# Patient Record
Sex: Male | Born: 1945
Health system: Southern US, Community
[De-identification: ages and names within clinical notes are randomized; demographics above are authoritative.]

## PROBLEM LIST (undated history)

## (undated) ENCOUNTER — Emergency Department (HOSPITAL_BASED_OUTPATIENT_CLINIC_OR_DEPARTMENT_OTHER): Admission: EM | Payer: PPO | Source: Home / Self Care

## (undated) DIAGNOSIS — H269 Unspecified cataract: Secondary | ICD-10-CM

## (undated) DIAGNOSIS — J342 Deviated nasal septum: Secondary | ICD-10-CM

## (undated) DIAGNOSIS — R200 Anesthesia of skin: Secondary | ICD-10-CM

## (undated) DIAGNOSIS — K219 Gastro-esophageal reflux disease without esophagitis: Secondary | ICD-10-CM

## (undated) DIAGNOSIS — J439 Emphysema, unspecified: Secondary | ICD-10-CM

## (undated) DIAGNOSIS — Z8701 Personal history of pneumonia (recurrent): Secondary | ICD-10-CM

## (undated) DIAGNOSIS — Z87891 Personal history of nicotine dependence: Secondary | ICD-10-CM

## (undated) DIAGNOSIS — I10 Essential (primary) hypertension: Secondary | ICD-10-CM

## (undated) DIAGNOSIS — E785 Hyperlipidemia, unspecified: Secondary | ICD-10-CM

## (undated) DIAGNOSIS — M189 Osteoarthritis of first carpometacarpal joint, unspecified: Secondary | ICD-10-CM

## (undated) DIAGNOSIS — Z8619 Personal history of other infectious and parasitic diseases: Secondary | ICD-10-CM

## (undated) DIAGNOSIS — E669 Obesity, unspecified: Secondary | ICD-10-CM

## (undated) DIAGNOSIS — J449 Chronic obstructive pulmonary disease, unspecified: Secondary | ICD-10-CM

## (undated) DIAGNOSIS — K4020 Bilateral inguinal hernia, without obstruction or gangrene, not specified as recurrent: Secondary | ICD-10-CM

## (undated) HISTORY — DX: Osteoarthritis of first carpometacarpal joint, unspecified: M18.9

## (undated) HISTORY — DX: Emphysema, unspecified: J43.9

## (undated) HISTORY — DX: Personal history of nicotine dependence: Z87.891

## (undated) HISTORY — DX: Personal history of other infectious and parasitic diseases: Z86.19

## (undated) HISTORY — DX: Personal history of pneumonia (recurrent): Z87.01

## (undated) HISTORY — DX: Bilateral inguinal hernia, without obstruction or gangrene, not specified as recurrent: K40.20

## (undated) HISTORY — DX: Obesity, unspecified: E66.9

## (undated) HISTORY — PX: CARDIAC CATHETERIZATION: SHX172

## (undated) HISTORY — DX: Unspecified cataract: H26.9

## (undated) HISTORY — DX: Deviated nasal septum: J34.2

---
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
## 2009-08-04 ENCOUNTER — Encounter: Admission: RE | Admit: 2009-08-04 | Discharge: 2009-08-04 | Payer: Self-pay | Admitting: Family Medicine

## 2009-08-05 ENCOUNTER — Ambulatory Visit: Payer: Self-pay | Admitting: Pulmonary Disease

## 2009-08-05 DIAGNOSIS — J439 Emphysema, unspecified: Secondary | ICD-10-CM

## 2009-08-06 DIAGNOSIS — J159 Unspecified bacterial pneumonia: Secondary | ICD-10-CM | POA: Insufficient documentation

## 2009-08-06 DIAGNOSIS — Z8701 Personal history of pneumonia (recurrent): Secondary | ICD-10-CM

## 2009-08-06 HISTORY — DX: Personal history of pneumonia (recurrent): Z87.01

## 2009-08-20 ENCOUNTER — Telehealth (INDEPENDENT_AMBULATORY_CARE_PROVIDER_SITE_OTHER): Payer: Self-pay | Admitting: *Deleted

## 2009-09-01 ENCOUNTER — Ambulatory Visit: Payer: Self-pay | Admitting: Pulmonary Disease

## 2009-09-01 ENCOUNTER — Ambulatory Visit (HOSPITAL_BASED_OUTPATIENT_CLINIC_OR_DEPARTMENT_OTHER): Admission: RE | Admit: 2009-09-01 | Discharge: 2009-09-01 | Payer: Self-pay | Admitting: Pulmonary Disease

## 2009-09-01 ENCOUNTER — Ambulatory Visit: Payer: Self-pay | Admitting: Diagnostic Radiology

## 2009-10-30 ENCOUNTER — Ambulatory Visit: Payer: Self-pay | Admitting: Pulmonary Disease

## 2009-10-30 LAB — CONVERTED CEMR LAB
Chloride: 104 meq/L (ref 96–112)
Creatinine, Ser: 0.9 mg/dL (ref 0.4–1.5)
GFR calc non Af Amer: 94.32 mL/min (ref >60)
Potassium: 4.9 meq/L (ref 3.5–5.1)

## 2009-11-03 ENCOUNTER — Ambulatory Visit: Payer: Self-pay | Admitting: Cardiovascular Disease

## 2009-11-10 ENCOUNTER — Ambulatory Visit: Payer: Self-pay | Admitting: Pulmonary Disease

## 2010-02-26 ENCOUNTER — Ambulatory Visit: Payer: Self-pay | Admitting: Pulmonary Disease

## 2010-03-11 ENCOUNTER — Ambulatory Visit: Payer: Self-pay | Admitting: Internal Medicine

## 2010-04-13 NOTE — Assessment & Plan Note (Signed)
Summary: rov/ mbw   Copy to:  [facility]-Dr. Copland Primary Provider/Referring Provider:  n/a  CC:  Pt here for follow up after CT.  History of Present Illness: 65/M, heavy ex smoker for FU of mild COPD & abnormal chest CT. He smoked > 40 pyrs before quitting in 2010. He developed a chest cold 4 weeks ago & presented to urgent care on 08/04/09 with chest pain x 7 days radiating from his right chest to his back, like a 'knife stuck there', pleuritic with cough & deep inspiration. Splinting the right chest seemed to help & vicodin relieved it. He denied fever, chills but had productive phlegm now after starting levaquin. CXR sugegsted fibrotic changes rt apex. CTc hest clarified this as biapical bullous emphysema with distortion of RUL with thick reticulo-nodular opacity extending to rt hilum & a small air-fluid level in one of the bulla. Mild paratracheal & pre-carinal LN was noted. Labs showed wc of 10.8 with 70%G & Hb 13.5. ekg showed nSR.  September 01, 2009 11:24 AM  c/o waking up with right side chest pain this AM and cough. Pt states finished abx Sunday and has been working half days to stay out of the sun while on same. c/o clear phlegm, no wheeze, fevers, hemoptysis. CXR appears improved.  November 10, 2009 4:29 PM  c/o dyspnea if climbing stairs with stone, ok onlevel ground CT chest - resolved AF level & mediastinal LNs Spirometry >> mild airway obstruction - FEV1 77%, severe in smaller airways  Preventive Screening-Counseling & Management  Alcohol-Tobacco     Smoking Status: quit     Packs/Day: 2.0     Year Started: 1969     Year Quit: 2010  Current Medications (verified): 1)  None  Allergies (verified): No Known Drug Allergies  Past History:  Family History: Last updated: 08/05/2009 Heart disease-mother  Social History: Last updated: 08/05/2009 Marital Status: married lives with wife Children: yes 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 (40 years 2ppd, quit 2010)  Review of Systems       The patient complains of dyspnea on exertion.  The patient denies anorexia, fever, weight loss, weight gain, vision loss, decreased hearing, hoarseness, chest pain, syncope, peripheral edema, prolonged cough, headaches, hemoptysis, abdominal pain, melena, hematochezia, severe indigestion/heartburn, hematuria, muscle weakness, suspicious skin lesions, transient blindness, difficulty walking, depression, unusual weight change, and abnormal bleeding.    Vital Signs:  Patient profile:   65 year old male Height:      69 inches Weight:      205 pounds BMI:     30.38 O2 Sat:      95 % on Room air Temp:     98 .3 degrees F oral Pulse rate:   63 / minute BP sitting:   142 / 88  (left arm) Cuff size:   regular  Vitals Entered By: Zackery Barefoot CMA (November 10, 2009 4:10 PM)  O2 Flow:  Room air CC: Pt here for follow up after CT Comments Medications reviewed with patient Verified contact number and pharmacy with patient Zackery Barefoot CMA  November 10, 2009 4:12 PM    Physical Exam  Additional Exam:  Gen. Pleasant, well-nourished, in no distress, normal affect ENT - no lesions, no post nasal drip Neck: No JVD, no thyromegaly, no carotid bruits Lungs: no use of accessory muscles, no dullness to percussion, clear without rales or rhonchi , increased VR rt apex Cardiovascular: Rhythm regular, heart sounds  normal,  no murmurs or gallops, no peripheral edema Musculoskeletal: No deformities, no cyanosis or clubbing      Impression & Recommendations:  Problem # 1:  BACTERIAL PNEUMONIA (ICD-482.9)  -resolved on CT chet NO fu imaging required  Orders: Est. Patient Level III (16109) Spirometry w/Graph (94010)  Problem # 2:  C O P D (ICD-496) -mild, Gold stg II albuterol MDI as needed   Patient Instructions: 1)  Copy sent to: Dr 2)  Please schedule a follow-up appointment in 6 months  with TP 3)  Take flu shot every year 4)  Take oneumonia shot @ age 73 5)  Albuterol inhaler as needed

## 2010-04-13 NOTE — Miscellaneous (Signed)
Summary: Orders Update  Clinical Lists Changes  Orders: Added new Test order of T-2 View CXR (71020TC) - Signed 

## 2010-04-13 NOTE — Assessment & Plan Note (Signed)
Summary: 3 weeks w/cxr/in [facility] office/apc   Visit Type:  Follow-up Copy to:  [facility]-Dr. Copland Primary Provider/Referring Provider:  n/a  CC:  Pt here for follow up with CXR. Pt c/o waking up with right side chest pain this AM and cough. Pt states finished abx "Sunday and has been working half days to stay out of the sun while on same.  History of Present Illness: 65/M, heavy ex smoker for evaluation of abnormal chest CT findings. He smoked > 40 pyrs before quitting in 2010. He developed a chest cold 4 weeks ago & presented to urgent care on 08/04/09 with chest pain x 7 days radiating from his right chest to his back, like a 'knife stuck there', pleuritic with cough & deep inspiration. Splinting the right chest seemed to help & vicodin relieved it. He denied fever, chills but had productive phlegm now after starting levaquin. CXR sugegsted fibrotic changes rt apex. CTc hest clarified this as biapical bullous emphysema with distortion of RUL with thick reticulo-nodular opacity extending to rt hilum & a small air-fluid level in one of the bulla. Mild paratracheal & pre-carinal LN was noted. Labs showed wc of 10.8 with 70%G & Hb 13.5. ekg showed nSR.  September 01, 2009 11:24 AM  c/o waking up with right side chest pain this AM and cough. Pt states finished abx Sunday and has been working half days to stay out of the sun while on same. c/o clear phlegm, no wheeze, fevers, hemoptysis. CXR appears improved.  Preventive Screening-Counseling & Management  Alcohol-Tobacco     Smoking Status: quit  Current Medications (verified): 1)  Vicodin 5-500 Mg Tabs (Hydrocodone-Acetaminophen) .... 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 (40 years  2ppd, quit 2010)  Review of Systems       The patient complains of dyspnea on exertion.  The patient denies anorexia, fever, weight loss, weight gain, vision loss, decreased hearing, hoarseness, chest pain, syncope, peripheral edema, prolonged cough, headaches, hemoptysis, abdominal pain, melena, hematochezia, severe indigestion/heartburn, hematuria, incontinence, suspicious skin lesions, difficulty walking, depression, unusual weight change, and abnormal bleeding.    Vital Signs:  Patient profile:   65 year old male Height:      69 inches Weight:      192.5 pounds BMI:     28.53 O2 Sat:      97 % on Room air Temp:     98" .4 degrees F oral Pulse rate:   63 / minute BP sitting:   122 / 82  (left arm) Cuff size:   regular  Vitals Entered By: Zackery Barefoot CMA (September 01, 2009 11:04 AM)  O2 Flow:  Room air CC: Pt here for follow up with CXR. Pt c/o waking up with right side chest pain this AM, cough. Pt states finished abx Sunday and has been working half days to stay out of the sun while on same Comments Medications reviewed with patient Verified contact number and pharmacy with patient Zackery Barefoot CMA  September 01, 2009 11:07 AM    Physical Exam  Additional Exam:  Gen. Pleasant, well-nourished, in no distress, normal affect ENT - no lesions, no post nasal drip Neck: No JVD, no thyromegaly, no carotid bruits Lungs: no use of accessory muscles, no dullness to percussion,  clear without rales or rhonchi , increased VR rt apex Cardiovascular: Rhythm regular, heart sounds  normal, no murmurs or gallops, no peripheral edema Musculoskeletal: No deformities, no cyanosis or clubbing      CXR  Procedure date:  09/01/2009  Findings:      IMPRESSION: Stable COPD/emphysema, scarring in the right upper lobe, and fibrosis in the right middle lobe.  No new abnormalities.  Impression & Recommendations:  Problem # 1:  BACTERIAL PNEUMONIA (ICD-482.9) No more ABx required - based on  symptoms & CXR FU CT in 2 months  Orders: Est. Patient Level III (78295) Radiology Referral (Radiology)  Problem # 2:  C O P D (ICD-496) spirometry on next visit once fully resolved.  Patient Instructions: 1)  Copy sent to: 2)  Please schedule a follow-up appointment in 2 months. 3)  A Chest CT with Contrast has been recommended.  Your imaging study may require preauthorization.

## 2010-04-13 NOTE — Progress Notes (Signed)
Summary: talk to nurse  Phone Note Call from Patient Call back at [phone]   Caller: Patient Call For: alva Summary of Call: have questions about his condition and whether he should get refill on med Initial call taken by: Rickard Patience,  August 20, 2009 9:31 AM  Follow-up for Phone Call        pt states per pt instruction sheet he was supposed to call and give an update in 2 weeks on how he was feeling. he states he still has an occ. cough and some soreness, but it is much improved. He ahs f/u set for june 21st. He wanted to know does he need to take more abx?  Please advise. Carron Curie CMA  August 20, 2009 10:38 AM  avelox x 7 more days , then CXR please Follow-up by: Comer Locket. Vassie Loll MD,  August 20, 2009 11:28 AM  Additional Follow-up for Phone Call Additional follow up Details #1::        pt had refill on avelox so he is going to pharmacy to pick up now-also has appt 6/21 with RA at the [facility] will have repeat cxr at [facility] Additional Follow-up by: Philipp Deputy CMA,  August 20, 2009 11:54 AM

## 2010-04-13 NOTE — Assessment & Plan Note (Signed)
Summary: ABNORMAL CHEST CT ///kp   Visit Type:  Initial Consult Copy to:  [facility]-Dr. Copland Primary Provider/Referring Provider:  n/a  CC:  Pt c/o cough and chest pain.  History of Present Illness: 64/M, heavy ex smoker for evaluation of abnormal chest CT findings. He smoked > 40 pyrs before quitting in 2010. He developed a chest cold 4 weeks ago & presented to urgent care on 08/04/09 with chest pain x 7 days radiating from his right chest to his back, like a 'knife stuck there', pleuritic with cough & deep inspiration. Splinting the right chest seemed to help & vicodin relieved it. He denied fever, chills but had productive phlegm now after starting levaquin. CXR sugegsted fibrotic changes rt apex. CTc hest clarified this as biapical bullous emphysema with distortion of RUL with thick reticulo-nodular opacity extending to rt hilum & a small air-fluid level in one of the bulla. Mild paratracheal & pre-carinal LN was noted. Labs showed wc of 10.8 with 70%G & Hb 13.5. ekg showed nSR.   Preventive Screening-Counseling & Management  Alcohol-Tobacco     Smoking Status: quit     Packs/Day: 2.0     Year Started: 1969     Year Quit: 2010  Current Medications (verified): 1)  Levaquin .... Take 1 Tablet By Mouth Once A Day 2)  Vicodin 5-500 Mg Tabs (Hydrocodone-Acetaminophen) .... Take 1 Tablet By Mouth Two Times A Day As Needed  Allergies (verified): No Known Drug Allergies  Past History:  Past Medical History: none  Past Surgical History: none  Family History: Heart disease-mother  Social History: Marital Status: married lives with wife Children: yes Occupation: Scientist, research (physical sciences) Patient states former smoker. (40 years 2ppd, quit 2010) Smoking Status:  quit Packs/Day:  2.0  Review of Systems       The patient complains of shortness of breath with activity, chest pain, and joint stiffness or pain.  The patient denies shortness of breath  at rest, productive cough, non-productive cough, coughing up blood, irregular heartbeats, acid heartburn, indigestion, loss of appetite, weight change, abdominal pain, difficulty swallowing, sore throat, tooth/dental problems, headaches, nasal congestion/difficulty breathing through nose, sneezing, itching, ear ache, anxiety, depression, hand/feet swelling, rash, change in color of mucus, and fever.    Vital Signs:  Patient profile:   65 year old male Height:      69 inches Weight:      196 pounds BMI:     29.05 O2 Sat:      94 % on Room air Temp:     99.6 degrees F oral Pulse rate:   77 / minute BP sitting:   110 / 70  (left arm) Cuff size:   regular  Vitals Entered By: Zackery Barefoot CMA (Aug 05, 2009 3:27 PM)  O2 Flow:  Room air CC: Pt c/o cough, chest pain Comments Medications reviewed with patient Verified contact number and pharmacy with patient Zackery Barefoot CMA  Aug 05, 2009 3:28 PM    Physical Exam  Additional Exam:  Gen. Pleasant, well-nourished, in no distress, normal affect ENT - no lesions, no post nasal drip Neck: No JVD, no thyromegaly, no carotid bruits Lungs: no use of accessory muscles, no dullness to percussion, clear without rales or rhonchi , increased VR rt apex Cardiovascular: Rhythm regular, heart sounds  normal, no murmurs or gallops, no peripheral edema Abdomen: soft and non-tender, no hepatosplenomegaly, BS normal. Musculoskeletal: No deformities, no cyanosis or clubbing Neuro:  alert, non focal  Impression & Recommendations:  Problem # 1:  BACTERIAL PNEUMONIA (ICD-482.9)  treat as community acqd pna RUL - may need longer duration of ABx to clear RUL 14-21 days depending on clinical improvement. Postural drainage of RUL. will need FU CXR in 2-3 wks & FU CT chest for clearance in 3 months His updated medication list for this problem includes:    Avelox 400 Mg Tabs (Moxifloxacin hcl) ..... Once daily  Orders: Consultation Level IV  (16109) Prescription Created Electronically [phone])  Problem # 2:  C O P D (ICD-496) lung function assessment when resolved pna no need for O2 at this time  Medications Added to Medication List This Visit: 1)  Levaquin  .... Take 1 tablet by mouth once a day 2)  Avelox 400 Mg Tabs (Moxifloxacin hcl) .... Once daily 3)  Vicodin 5-500 Mg Tabs (Hydrocodone-acetaminophen) .... Take 1 tablet by mouth two times a day as needed  Patient Instructions: 1)  Please schedule a follow-up appointment in 3 weeks with CXR 2)  Copy sent to:Dr copland 3)  Levaquin x 2 weeks 4)  Call me in 2 wks to report - fever, cough, pain Prescriptions: AVELOX 400 MG TABS (MOXIFLOXACIN HCL) once daily  #10 x 1   Entered and Authorized by:   Comer Locket Vassie Loll MD   Signed by:   Comer Locket Vassie Loll MD on 08/05/2009   Method used:   Electronically to        [facility] (retail)       [address]       [place], [place]  [id number]       Ph: [phone]       Fax: [phone]   RxID:   [id number]

## 2010-04-15 NOTE — Assessment & Plan Note (Signed)
Summary: per pt call/cb   Copy to:  [facility]-Dr. Copland Primary Arietta Eisenstein/Referring Alysiah Suppa:  n/a  CC:  Followup per Dr Vassie Loll.  Pt states that his breathing is some better since last seen.  Marland Kitchen  History of Present Illness: 64/M, heavy ex smoker for FU of mild COPD & abnormal chest CT. He smoked > 40 pyrs before quitting in 2010. He developed a chest cold 4 weeks ago & presented to urgent care on 08/04/09 with chest pain x 7 days radiating from his right chest to his back, like a 'knife stuck there', pleuritic with cough & deep inspiration. Splinting the right chest seemed to help & vicodin relieved it. He denied fever, chills but had productive phlegm now after starting levaquin. CXR sugegsted fibrotic changes rt apex. CTc hest clarified this as biapical bullous emphysema with distortion of RUL with thick reticulo-nodular opacity extending to rt hilum & a small air-fluid level in one of the bulla. Mild paratracheal & pre-carinal LN was noted. Labs showed wc of 10.8 with 70%G & Hb 13.5. ekg showed nSR.  September 01, 2009 11:24 AM  c/o waking up with right side chest pain this AM and cough. Pt states finished abx Sunday and has been working half days to stay out of the sun while on same. c/o clear phlegm, no wheeze, fevers, hemoptysis. CXR appears improved.  November 10, 2009 4:29 PM  c/o dyspnea if climbing stairs with stone, ok onlevel ground CT chest - resolved AF level & mediastinal LNs Spirometry >> mild airway obstruction - FEV1 77%, severe in smaller airways  02/19/10--Presents for 4 months follow up  Pt states that his breathing is some better since last seen.   Doing okay since last office visit. Rare use of proair. No flare in symtpoms of dyspnea or cough. We disucssed that he needs to establish with PCP -he does not have a PCP. Denies chest pain, dyspnea, orthopnea, hemoptysis, fever, n/v/d, edema, headache.  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 (40 years 2ppd, quit 2010)  Review of Systems      See HPI  Vital Signs:  Patient profile:   64 year old male Weight:      213 pounds O2 Sat:      95 % on Room air Temp:     99 .2 degrees F oral Pulse rate:   77 / minute BP sitting:   162 / 98  (left arm)  Vitals Entered By: Vernie Murders (March 01, 2010 4:09 PM)  O2 Flow:  Room air CC: Followup per Dr Vassie Loll.  Pt states that his breathing is some better since last seen.   Is Patient Diabetic? No   Physical Exam  Additional Exam:  Gen. Pleasant, well-nourished, in no distress, normal affect ENT - no lesions, no post nasal drip Neck: No JVD, no thyromegaly, no carotid bruits Lungs: no use of accessory muscles, no dullness to percussion, clear without rales or rhonchi  Cardiovascular: Rhythm regular, heart sounds  normal, no murmurs or gallops, no peripheral edema Musculoskeletal: No deformities, no cyanosis or clubbing      Impression & Recommendations:  Problem # 1:  C O P D (ICD-496) -mild, Gold stg II--asymptomatic presently.  albuterol MDI as needed  will set up with PCP   Medications Added to Medication List This Visit: 1)  Proair Hfa 108 (90 Base) Mcg/act Aers (Albuterol sulfate) .... 2 puffs every 4-6 hr as needed wheezing  Complete Medication List: 1)  Proair Hfa 108 (90 Base) Mcg/act Aers (Albuterol sulfate) .... 2 puffs every 4-6 hr as needed wheezing  Other Orders: Internal Medicine Referral (Internal) Est. Patient Level II (81191)  Patient Instructions: 1)  Please schedule a follow-up appointment in 9  months with Dr. Vassie Loll  2)  Take flu shot every year 3)  Take  pneumonia shot @ age 48 4)  Albuterol inhaler as needed  5)  Need to establish with family doctor.

## 2011-03-18 ENCOUNTER — Encounter: Payer: Self-pay | Admitting: Emergency Medicine

## 2011-03-18 ENCOUNTER — Emergency Department (HOSPITAL_COMMUNITY)
Admission: EM | Admit: 2011-03-18 | Discharge: 2011-03-19 | Disposition: A | Discharge status: Home / Self Care | Payer: BC Managed Care – PPO | Attending: Emergency Medicine | Admitting: Emergency Medicine

## 2011-03-18 DIAGNOSIS — J90 Pleural effusion, not elsewhere classified: Secondary | ICD-10-CM | POA: Insufficient documentation

## 2011-03-18 DIAGNOSIS — R059 Cough, unspecified: Secondary | ICD-10-CM | POA: Insufficient documentation

## 2011-03-18 DIAGNOSIS — R079 Chest pain, unspecified: Secondary | ICD-10-CM | POA: Insufficient documentation

## 2011-03-18 DIAGNOSIS — R05 Cough: Secondary | ICD-10-CM | POA: Insufficient documentation

## 2011-03-18 DIAGNOSIS — J159 Unspecified bacterial pneumonia: Secondary | ICD-10-CM | POA: Insufficient documentation

## 2011-03-18 DIAGNOSIS — J449 Chronic obstructive pulmonary disease, unspecified: Secondary | ICD-10-CM | POA: Insufficient documentation

## 2011-03-18 DIAGNOSIS — J4489 Other specified chronic obstructive pulmonary disease: Secondary | ICD-10-CM | POA: Insufficient documentation

## 2011-03-18 HISTORY — DX: Chronic obstructive pulmonary disease, unspecified: J44.9

## 2011-03-18 NOTE — ED Notes (Signed)
EKG completed and shown to Dr. Bebe Shaggy. No OLD ekg.

## 2011-03-18 NOTE — ED Notes (Signed)
PT. REPORTS PROGRESSING LEFT LATERAL RIBCAGE PAIN WITH SOB AND PRODUCTIVE COUGH ONSET YESTERDAY , DENIES INJURY OR FALL , NO FEVER OR CHILLS.

## 2011-03-19 ENCOUNTER — Emergency Department (HOSPITAL_COMMUNITY): Payer: BC Managed Care – PPO

## 2011-03-19 ENCOUNTER — Encounter (HOSPITAL_COMMUNITY): Payer: Self-pay | Admitting: Radiology

## 2011-03-19 LAB — BASIC METABOLIC PANEL
CO2: 24 mEq/L (ref 19–32)
Calcium: 9 mg/dL (ref 8.4–10.5)
Creatinine, Ser: 0.93 mg/dL (ref 0.50–1.35)
GFR calc Af Amer: >90 mL/min (ref >90)
Glucose, Bld: 116 mg/dL — ABNORMAL HIGH (ref 70–99)
Sodium: 140 mEq/L (ref 135–145)

## 2011-03-19 LAB — CBC
MCHC: 35.1 g/dL (ref 30.0–36.0)
Platelets: 217 10*3/uL (ref 150–400)
RBC: 4.64 MIL/uL (ref 4.22–5.81)

## 2011-03-19 LAB — POCT I-STAT TROPONIN I: Troponin i, poc: 0 ng/mL (ref 0.00–0.08)

## 2011-03-19 MED ORDER — KETOROLAC TROMETHAMINE 30 MG/ML IJ SOLN
30.0000 mg | Freq: Once | INTRAMUSCULAR | Status: AC
  Administered 2011-03-19: 30 mg via INTRAVENOUS
  Filled 2011-03-19: qty 1

## 2011-03-19 MED ORDER — HYDROCODONE-ACETAMINOPHEN 5-500 MG PO TABS: 1.0000 | ORAL_TABLET | Freq: Four times a day (QID) | ORAL | Status: DC | PRN

## 2011-03-19 MED ORDER — IOHEXOL 300 MG/ML  SOLN
100.0000 mL | Freq: Once | INTRAMUSCULAR | Status: AC | PRN
  Administered 2011-03-19: 100 mL via INTRAVENOUS

## 2011-03-19 MED ORDER — MOXIFLOXACIN HCL 400 MG PO TABS
400.0000 mg | ORAL_TABLET | Freq: Once | ORAL | Status: AC
  Administered 2011-03-19: 400 mg via ORAL
  Filled 2011-03-19: qty 1

## 2011-03-19 MED ORDER — LEVOFLOXACIN 500 MG PO TABS: 500.0000 mg | ORAL_TABLET | Freq: Every day | ORAL | Status: DC

## 2011-03-19 MED ORDER — FENTANYL CITRATE 0.05 MG/ML IJ SOLN
50.0000 ug | Freq: Once | INTRAMUSCULAR | Status: AC
  Administered 2011-03-19: 04:00:00 via INTRAVENOUS
  Filled 2011-03-19: qty 2

## 2011-03-19 NOTE — ED Provider Notes (Signed)
History     CSN: [id number]  Arrival date & time 03/18/11  2345   First MD Initiated Contact with Patient 03/19/11 [phone]      Chief Complaint  Patient presents with  . Chest Pain    (Consider location/radiation/quality/duration/timing/severity/associated sxs/prior treatment) Patient is a 66 y.o. male presenting with chest pain. The history is provided by the patient. No language interpreter was used.  Chest Pain The chest pain began 2 days ago. Chest pain occurs constantly. The chest pain is unchanged. The pain is associated with breathing and coughing. At its most intense, the pain is at 9/10. The pain is currently at 3/10. The quality of the pain is described as dull, pleuritic and sharp. The pain does not radiate (left mid axillary line). Chest pain is worsened by deep breathing. Primary symptoms include cough. Pertinent negatives for primary symptoms include no fever, no fatigue, no syncope, no shortness of breath, no wheezing, no palpitations, no abdominal pain, no nausea, no vomiting, no dizziness and no altered mental status. Treatments tried: vicodin. Risk factors include being elderly and male gender.  Pertinent negatives for past medical history include no aneurysm.  Pertinent negatives for family medical history include: family history of aortic dissection.  Procedure history is negative for cardiac catheterization.     Past Medical History  Diagnosis Date  . COPD (chronic obstructive pulmonary disease)     History reviewed. No pertinent past surgical history.  No family history on file.  History  Substance Use Topics  . Smoking status: Never Smoker   . Smokeless tobacco: Not on file  . Alcohol Use: No      Review of Systems  Constitutional: Negative for fever and fatigue.  HENT: Negative for facial swelling, neck pain and neck stiffness.   Eyes: Negative for discharge.  Respiratory: Positive for cough. Negative for shortness of breath and wheezing.     Cardiovascular: Positive for chest pain. Negative for palpitations and syncope.  Gastrointestinal: Negative for nausea, vomiting, abdominal pain and abdominal distention.  Genitourinary: Negative for difficulty urinating.  Musculoskeletal: Negative for arthralgias.  Neurological: Negative for dizziness.  Hematological: Negative.   Psychiatric/Behavioral: Negative.  Negative for altered mental status.    Allergies  Review of patient's allergies indicates no known allergies.  Home Medications   Current Outpatient Rx  Name Route Sig Dispense Refill  . HYDROCODONE-ACETAMINOPHEN 5-500 MG PO TABS Oral Take 1 tablet by mouth every 6 (six) hours as needed. For pain     . HYDROCODONE-ACETAMINOPHEN 5-500 MG PO TABS Oral Take 1 tablet by mouth every 6 (six) hours as needed for pain. 10 tablet 0  . LEVOFLOXACIN 500 MG PO TABS Oral Take 1 tablet (500 mg total) by mouth daily. 7 tablet 0    BP 152/82  Pulse 80  Temp 97.6 F (36.4 C)  Resp 22  SpO2 94%  Physical Exam  Constitutional: He is oriented to person, place, and time. He appears well-developed and well-nourished. No distress.  HENT:  Head: Normocephalic and atraumatic.  Mouth/Throat: Oropharynx is clear and moist. No oropharyngeal exudate.  Eyes: Conjunctivae and EOM are normal. Pupils are equal, round, and reactive to light.  Neck: Normal range of motion. Neck supple.  Cardiovascular: Normal rate and regular rhythm.   Pulmonary/Chest: Effort normal and breath sounds normal. No stridor. He has no wheezes. He exhibits tenderness.  Abdominal: Soft. Bowel sounds are normal. There is no tenderness. There is no guarding.  Musculoskeletal: Normal range of motion. He exhibits no  edema.  Lymphadenopathy:    He has no cervical adenopathy.  Neurological: He is alert and oriented to person, place, and time.  Skin: Skin is warm and dry. He is not diaphoretic.  Psychiatric: Thought content normal.    ED Course  Procedures (including  critical care time)  Labs Reviewed  BASIC METABOLIC PANEL - Abnormal; Notable for the following:    Glucose, Bld 116 (*)    GFR calc non Af Amer 88 (*)    All other components within normal limits  CBC  POCT I-STAT TROPONIN I  I-STAT TROPONIN I   Dg Chest 2 View  03/19/2011  *RADIOLOGY REPORT*  Clinical Data: Left lateral rib pain.  CHEST - 2 VIEW  Comparison: 11/03/2009  Findings: Linear opacity left lung base.  Interstitial prominence. There may be a small left pleural effusion.  No pneumothorax. Cardiomediastinal contours are within normal limits.  IMPRESSION: Small left pleural effusion.  Associated opacity; atelectasis versus infiltrate.  Original Report Authenticated By: Waneta Martins, M.D.   Ct Angio Chest W/cm &/or Wo Cm  03/19/2011  *RADIOLOGY REPORT*  Clinical Data:  Left lateral pain, shortness of breath, productive cough.  CT ANGIOGRAPHY CHEST WITH CONTRAST  Technique:  Multidetector CT imaging of the chest was performed using the standard protocol during bolus administration of intravenous contrast.  Multiplanar CT image reconstructions including MIPs were obtained to evaluate the vascular anatomy.  Contrast: OMNIPAQUE IOHEXOL 300 MG/ML IV SOLN  Comparison:  03/19/2011 radiograph, 11/03/2009 CT  Findings:  No pulmonary arterial branch filling defect.  Normal caliber aorta.  Mild scattered atherosclerotic calcification. Normal heart size.  No pericardial effusion. Right hilar lymph node is upper normal limits.  Otherwise, no intrathoracic lymphadenopathy.  Bibasilar consolidations.  Biapical bullous changes and scarring, centrolobular emphysematous changes.  Trace pleural fluid on the left.  Central airways are patent.  No pneumothorax.  No acute osseous abnormality.  Limited images through the upper abdomen demonstrate fatty liver. No acute abnormality. Small hiatal hernia.  Review of the MIP images confirms the above findings.  IMPRESSION: No pulmonary embolism.  Trace left  pleural effusion and bibasilar opacities; atelectasis versus pneumonia.  Emphysematous changes.  Hepatic steatosis.  Original Report Authenticated By: Waneta Martins, M.D.     1. BACTERIAL PNEUMONIA   2. Pleural effusion       MDM   Date: 03/19/2011  Rate: 78  Rhythm: normal sinus rhythm  QRS Axis: normal  Intervals: normal  ST/T Wave abnormalities: normal  Conduction Disutrbances:none  Narrative Interpretation:   Old EKG Reviewed: none available    Return for chest pain shortness of breath worsening cough, fevers chills or inability to tolerate medications     Mardi Cannady K Yassir Enis-Rasch, MD 03/19/11 [phone]

## 2011-03-21 ENCOUNTER — Telehealth: Payer: Self-pay | Admitting: Pulmonary Disease

## 2011-03-21 NOTE — Telephone Encounter (Signed)
Pl arrange FU with TP in 1-2 wks

## 2011-03-21 NOTE — Telephone Encounter (Signed)
Pt states he was seen in ER and dx with PNA. Being treated with Levaquin 500mg  x 7 days and Hydrocodone 5/500mg . Wants to know if/when RA wants him to return for follow. Dr. Vassie Loll please advise. Thanks. Pt is aware that RA is out of the office today.

## 2011-03-22 NOTE — Telephone Encounter (Signed)
LMTCB

## 2011-03-22 NOTE — Telephone Encounter (Signed)
Pt is scheduled to come in on 03/28/11 at 3:00 for HFU w/ TP. Pt is aware of our location.

## 2011-03-28 ENCOUNTER — Ambulatory Visit (INDEPENDENT_AMBULATORY_CARE_PROVIDER_SITE_OTHER)
Admission: RE | Admit: 2011-03-28 | Discharge: 2011-03-28 | Disposition: A | Discharge status: Home / Self Care | Payer: BC Managed Care – PPO | Source: Ambulatory Visit | Attending: Adult Health | Admitting: Adult Health

## 2011-03-28 ENCOUNTER — Encounter: Payer: Self-pay | Admitting: Adult Health

## 2011-03-28 ENCOUNTER — Ambulatory Visit (INDEPENDENT_AMBULATORY_CARE_PROVIDER_SITE_OTHER): Payer: BC Managed Care – PPO | Admitting: Adult Health

## 2011-03-28 DIAGNOSIS — J449 Chronic obstructive pulmonary disease, unspecified: Secondary | ICD-10-CM

## 2011-03-28 DIAGNOSIS — J159 Unspecified bacterial pneumonia: Secondary | ICD-10-CM

## 2011-03-28 DIAGNOSIS — J189 Pneumonia, unspecified organism: Secondary | ICD-10-CM

## 2011-03-28 NOTE — Patient Instructions (Signed)
I will call with xray results.  follow up Dr. Vassie Loll  In 4 weeks and As needed

## 2011-03-29 NOTE — Progress Notes (Signed)
Subjective:    Patient ID: Ryan Mathews, male    DOB: 04-28-1945, 66 y.o.   MRN: [id number]  HPI 66/M, heavy ex smoker for FU of mild COPD & abnormal chest CT.  He smoked > 40 pyrs before quitting in 2010. He developed a chest cold 4 weeks ago & presented to urgent care on 08/04/09 with chest pain x 7 days radiating from his right chest to his back, like a 'knife stuck there', pleuritic with cough & deep inspiration. Splinting the right chest seemed to help & vicodin relieved it. He denied fever, chills but had productive phlegm now after starting levaquin. CXR sugegsted fibrotic changes rt apex. CTc hest clarified this as biapical bullous emphysema with distortion of RUL with thick reticulo-nodular opacity extending to rt hilum & a small air-fluid level in one of the bulla. Mild paratracheal & pre-carinal LN was noted. Labs showed wc of 10.8 with 70%G & Hb 13.5. ekg showed nSR.   September 01, 2009 11:24 AM  c/o waking up with right side chest pain this AM and cough. Pt states finished abx Sunday and has been working half days to stay out of the sun while on same. c/o clear phlegm, no wheeze, fevers, hemoptysis.  CXR appears improved.  November 10, 2009 4:29 PM  c/o dyspnea if climbing stairs with stone, ok onlevel ground  CT chest - resolved AF level & mediastinal LNs  Spirometry >> mild airway obstruction - FEV1 77%, severe in smaller airways   02/19/10--Presents for 4 months follow up Pt states that his breathing is some better since last seen. Doing okay since last office visit. Rare use of proair. No flare in symtpoms of dyspnea or cough. We disucssed that he needs to establish with PCP -he does not have a PCP. Denies chest pain, dyspnea, orthopnea, hemoptysis, fever, n/v/d, edema, headache. >no changes   03/28/11 ER follow up Pt returns for follow up from ER . Seen 10 days ago for PNA in ER . Tx w/ Levaquin . Has now finished. CXR showed a bibasilar infiltrates vs atx with sm. Left effusion .  He is feeling better with decreased cough and congestion . Not seen for >1 year, says he has been well with no resp complaints until 2 weeks ago. Not on any meds. No hemoptysis or weight loss.  CXR today shows Resolution of interstitial infiltrates and most of the atelectasis.  Tiny residual left effusion and left base atelectasis.    Review of Systems Constitutional:   No  weight loss, night sweats,  Fevers, chills,  +fatigue, or  lassitude.  HEENT:   No headaches,  Difficulty swallowing,  Tooth/dental problems, or  Sore throat,                No sneezing, itching, ear ache, nasal congestion, post nasal drip,   CV:  No chest pain,  Orthopnea, PND, swelling in lower extremities, anasarca, dizziness, palpitations, syncope.   GI  No heartburn, indigestion, abdominal pain, nausea, vomiting, diarrhea, change in bowel habits, loss of appetite, bloody stools.   Resp:      No coughing up of blood.  No change in color of mucus.  No wheezing.  No chest wall deformity  Skin: no rash or lesions.  GU: no dysuria, change in color of urine, no urgency or frequency.  No flank pain, no hematuria   MS:  No joint pain or swelling.  No decreased range of motion.  No back pain.  Psych:  No change in mood or affect. No depression or anxiety.  No memory loss.         Objective:   Physical Exam        Assessment & Plan:

## 2011-03-29 NOTE — Assessment & Plan Note (Signed)
Clinically improving with Levaquin  CXR today is improved with resolution of PNA  May use mucinex dM Twice daily  As needed  Cough/congesiton  Please contact office for sooner follow up if symptoms do not improve or worsen or seek emergency care  follow up 1 month Dr. Vassie Loll

## 2011-03-29 NOTE — Assessment & Plan Note (Signed)
Currently compensated on no treatment without flare.

## 2011-04-25 ENCOUNTER — Telehealth: Payer: Self-pay | Admitting: Pulmonary Disease

## 2011-04-25 NOTE — Telephone Encounter (Signed)
LMTCB x 1 

## 2011-04-26 NOTE — Telephone Encounter (Signed)
Would use mucinex dm Twice daily  As needed  Cough/congestion  Fluids and rest  Saline nasal rinses As needed   If not improving will need ov.  Keep ov next week with Dr. Vassie Loll  As planned and As needed   Please contact office for sooner follow up if symptoms do not improve or worsen or seek emergency care

## 2011-04-26 NOTE — Telephone Encounter (Signed)
I spoke with pt and is aware of TP recs. He voiced his understanding and had no questions

## 2011-04-26 NOTE — Telephone Encounter (Signed)
Called and spoke with pt.  Pt states he has a "head cold."  Onset 4 days ago.  C/o head congestion, PND, and coughing up green sputum.  Denies f/c/s or sore throat, or sinus pressure/headache.  Pt is taking Sudafed and Catering manager.  Pt wanted to know if there was any other recs TP had for him as he stated he was dx with PNA in 03/2011 and is worried about getting it again.    TP, please advise as RA is out of office this week.  Thanks!   No Known Allergies

## 2011-05-03 ENCOUNTER — Ambulatory Visit (INDEPENDENT_AMBULATORY_CARE_PROVIDER_SITE_OTHER): Payer: BC Managed Care – PPO | Admitting: Pulmonary Disease

## 2011-05-03 ENCOUNTER — Encounter: Payer: Self-pay | Admitting: Pulmonary Disease

## 2011-05-03 VITALS — BP 140/84 | HR 71 | Temp 98.2°F | Ht 69.0 in | Wt 208.4 lb

## 2011-05-03 DIAGNOSIS — Z23 Encounter for immunization: Secondary | ICD-10-CM

## 2011-05-03 DIAGNOSIS — J449 Chronic obstructive pulmonary disease, unspecified: Secondary | ICD-10-CM

## 2011-05-03 DIAGNOSIS — J159 Unspecified bacterial pneumonia: Secondary | ICD-10-CM

## 2011-05-03 NOTE — Patient Instructions (Signed)
Pneumovax Call us for - fever, yellow-green phlegm

## 2011-05-03 NOTE — Progress Notes (Signed)
  Subjective:    Patient ID: Ryan Mathews, male    DOB: 1945-10-26, 66 y.o.   MRN: [id number]  HPI 66/M, heavy ex smoker for FU of mild COPD   He smoked > 40 pyrs before quitting in 2010.  He developed a chest cold & presented to urgent care on 08/04/09 with chest pain  CXR sugegsted fibrotic changes rt apex. CTc hest clarified this as biapical bullous emphysema with distortion of RUL with thick reticulo-nodular opacity extending to rt hilum & a small air-fluid level in one of the bulla. Mild paratracheal & pre-carinal LN was noted. November 10, 2009 -CT chest - resolved AF level & mediastinal LNs  Spirometry >> mild airway obstruction - FEV1 77%, severe in smaller airways    05/03/2011 Adm 1/13 for bibasal pna & symptoms of left pleurisy. Ct angio showed infiltrates, ?RLL bronchiectasis, no mediastinal Lns. FU CXR showed resolution of interstitial infiltrates and most of the atelectasis.  Tiny residual left effusion and left base atelectasis. Improved then developed head congestion, PND, and coughing up green sputum. Denies f/c/s or sore throat, or sinus pressure/headache. Pt is taking Sudafed and Alka Seltzer -mucinex dm Twice daily As needed >>Fluids and rest  Saline nasal rinses As needed He wants to know how he can 'prevent' pneumonia    Review of Systems Patient denies significant dyspnea,cough, hemoptysis,  chest pain, palpitations, pedal edema, orthopnea, paroxysmal nocturnal dyspnea, lightheadedness, nausea, vomiting, abdominal or  leg pains      Objective:   Physical Exam  Gen. Pleasant, well-nourished, in no distress ENT - no lesions, no post nasal drip Neck: No JVD, no thyromegaly, no carotid bruits Lungs: no use of accessory muscles, no dullness to percussion, clear without rales or rhonchi  Cardiovascular: Rhythm regular, heart sounds  normal, no murmurs or gallops, no peripheral edema Musculoskeletal: No deformities, no cyanosis or clubbing        Assessment &  Plan:

## 2011-05-04 NOTE — Assessment & Plan Note (Signed)
Mild - FEv1 77% Doubt meds needed since not overly symptomatic

## 2011-05-04 NOTE — Assessment & Plan Note (Signed)
resolved Will dose pneumovax We discussed other preventive measures , doubt roflimulast is needed here

## 2011-10-31 ENCOUNTER — Encounter: Payer: Self-pay | Admitting: Adult Health

## 2011-10-31 ENCOUNTER — Ambulatory Visit (INDEPENDENT_AMBULATORY_CARE_PROVIDER_SITE_OTHER): Payer: BC Managed Care – PPO | Admitting: Adult Health

## 2011-10-31 VITALS — BP 106/64 | HR 70 | Temp 98.1°F | Ht 69.0 in | Wt 206.8 lb

## 2011-10-31 DIAGNOSIS — J449 Chronic obstructive pulmonary disease, unspecified: Secondary | ICD-10-CM

## 2011-10-31 DIAGNOSIS — J4489 Other specified chronic obstructive pulmonary disease: Secondary | ICD-10-CM

## 2011-10-31 NOTE — Patient Instructions (Addendum)
follow up Dr. Vassie Loll  In 9 months and As needed   Call Mid September for flu shot

## 2011-10-31 NOTE — Progress Notes (Signed)
  Subjective:    Patient ID: Ryan Mathews, male    DOB: 1946/02/05, 66 y.o.   MRN: [id number]  HPI  66/M, heavy ex smoker for FU of mild COPD   He smoked > 40 pyrs before quitting in 2010.  He developed a chest cold & presented to urgent care on 08/04/09 with chest pain  CXR sugegsted fibrotic changes rt apex. CTc hest clarified this as biapical bullous emphysema with distortion of RUL with thick reticulo-nodular opacity extending to rt hilum & a small air-fluid level in one of the bulla. Mild paratracheal & pre-carinal LN was noted. November 10, 2009 -CT chest - resolved AF level & mediastinal LNs  Spirometry >> mild airway obstruction - FEV1 77%, severe in smaller airways    05/03/2011 Adm 1/13 for bibasal pna & symptoms of left pleurisy. Ct angio showed infiltrates, ?RLL bronchiectasis, no mediastinal Lns. FU CXR showed resolution of interstitial infiltrates and most of the atelectasis.  Tiny residual left effusion and left base atelectasis. Improved then developed head congestion, PND, and coughing up green sputum. Denies f/c/s or sore throat, or sinus pressure/headache. Pt is taking Sudafed and Alka Seltzer -mucinex dm Twice daily As needed >>Fluids and rest  Saline nasal rinses As needed He wants to know how he can 'prevent' pneumonia >>No changes   10/31/2011 Follow up  Returns for follow up .  Doing well since last ov with no flare in cough or dyspnea.  No ER or Hospitalizations  [employer].  No chest pain or edema.  Requests rx for shingles vaccine.  Does not have a PCP , discussed importance of PCP but he declines.    Review of Systems Constitutional:   No  weight loss, night sweats,  Fevers, chills, fatigue, or  Lassitude.  HEENT:   No headaches,  Difficulty swallowing,  Tooth/dental problems, or  Sore throat,                No sneezing, itching, ear ache, nasal congestion, post nasal drip,   CV:  No chest pain,  Orthopnea, PND,  swelling in lower extremities, anasarca, dizziness, palpitations, syncope.   GI  No heartburn, indigestion, abdominal pain, nausea, vomiting, diarrhea, change in bowel habits, loss of appetite, bloody stools.   Resp: No shortness of breath with exertion or at rest.  No excess mucus, no productive cough,  No non-productive cough,  No coughing up of blood.  No change in color of mucus.  No wheezing.  No chest wall deformity  Skin: no rash or lesions.  GU: no dysuria, change in color of urine, no urgency or frequency.  No flank pain, no hematuria   MS:  No joint   swelling.  No decreased range of motion.  No back pain.  Psych:  No change in mood or affect. No depression or anxiety.  No memory loss.          Objective:   Physical Exam   Gen. Pleasant, well-nourished, in no distress ENT - no lesions, no post nasal drip Neck: No JVD, no thyromegaly, no carotid bruits Lungs: no use of accessory muscles, no dullness to percussion, clear without rales or rhonchi  Cardiovascular: Rhythm regular, heart sounds  normal, no murmurs or gallops, no peripheral edema Musculoskeletal: No deformities, no cyanosis or clubbing        Assessment & Plan:

## 2011-10-31 NOTE — Assessment & Plan Note (Signed)
Compensated without flare  follow up with Dr. Vassie Loll  In 9 months and As needed    Shingle vaccine rx given

## 2012-08-02 ENCOUNTER — Ambulatory Visit (INDEPENDENT_AMBULATORY_CARE_PROVIDER_SITE_OTHER)
Admission: RE | Admit: 2012-08-02 | Discharge: 2012-08-02 | Disposition: A | Discharge status: Home / Self Care | Payer: Medicare Other | Source: Ambulatory Visit | Attending: Pulmonary Disease | Admitting: Pulmonary Disease

## 2012-08-02 ENCOUNTER — Encounter: Payer: Self-pay | Admitting: Pulmonary Disease

## 2012-08-02 ENCOUNTER — Ambulatory Visit (INDEPENDENT_AMBULATORY_CARE_PROVIDER_SITE_OTHER): Payer: Medicare Other | Admitting: Pulmonary Disease

## 2012-08-02 VITALS — BP 130/68 | HR 70 | Ht 69.0 in | Wt 211.2 lb

## 2012-08-02 DIAGNOSIS — J449 Chronic obstructive pulmonary disease, unspecified: Secondary | ICD-10-CM

## 2012-08-02 NOTE — Progress Notes (Signed)
  Subjective:    Patient ID: Ryan Mathews, male    DOB: [birth year], 67 y.o.   MRN: [id number]  HPI 67/M, heavy ex smoker for FU of mild COPD  He smoked > 40 pyrs before quitting in 2010.  He developed a chest cold & presented to urgent care on 08/04/09 with chest pain CXR sugegsted fibrotic changes rt apex. CTc hest clarified this as biapical bullous emphysema with distortion of RUL with thick reticulo-nodular opacity extending to rt hilum & a small air-fluid level in one of the bulla. Mild paratracheal & pre-carinal LN was noted.  November 10, 2009 -CT chest - resolved AF level & mediastinal LNs  Spirometry >> mild airway obstruction - FEV1 77%, severe in smaller airways   Adm 1/13 for bibasal pna & symptoms of left pleurisy.  Ct angio showed infiltrates, ?RLL bronchiectasis, no mediastinal Lns.  FU CXR showed resolution of interstitial infiltrates and most of the atelectasis.     08/02/2012 Doing well since last ov with no flare in cough or dyspnea.  No ER or Hospitalizations  [employer].  pt states his breathing is fine. He has slight congestion in AM and coughs up slight mucus (unsure color). He states this does not happen every AM. Denies any wheezing, chest tx.  Does not have a PCP , discussed importance of PCP He wants referral for umbilical hernia  Review of Systems neg for any significant sore throat, dysphagia, itching, sneezing, nasal congestion or excess/ purulent secretions, fever, chills, sweats, unintended wt loss, pleuritic or exertional cp, hempoptysis, orthopnea pnd or change in chronic leg swelling. Also denies presyncope, palpitations, heartburn, abdominal pain, nausea, vomiting, diarrhea or change in bowel or urinary habits, dysuria,hematuria, rash, arthralgias, visual complaints, headache, numbness weakness or ataxia.     Objective:   Physical Exam  Gen. Pleasant, well-nourished, in no distress ENT - no lesions, no post nasal  drip Neck: No JVD, no thyromegaly, no carotid bruits Lungs: no use of accessory muscles, no dullness to percussion, clear without rales or rhonchi  Cardiovascular: Rhythm regular, heart sounds  normal, no murmurs or gallops, no peripheral edema Abd- soft, non tender, moderate umbilical hernia, with redness of skin overlying Musculoskeletal: No deformities, no cyanosis or clubbing         Assessment & Plan:

## 2012-08-02 NOTE — Assessment & Plan Note (Addendum)
CXR today Referral to internist & surgeon for umbilical hernia No meds required - we discussed screening for lung cancer

## 2012-08-02 NOTE — Patient Instructions (Signed)
CXR today Referral to internist & surgeon

## 2012-08-07 ENCOUNTER — Telehealth: Payer: Self-pay | Admitting: Pulmonary Disease

## 2012-08-07 NOTE — Progress Notes (Signed)
Quick Note:  Spoke with patient informed him of results as listed below per RA. Patient verbalized understanding and nothing further needed at this time ______

## 2012-08-07 NOTE — Telephone Encounter (Signed)
Spoke with patient informed him of results as listed below per RA. Patient verbalized understanding and nothing further needed at this time   Notes Recorded by [name], MD on 08/03/2012 at 1:01 PM Mild scarring as before

## 2012-08-07 NOTE — Telephone Encounter (Signed)
Pt calling again in ref to previous msg can be reached at [phone].Ryan Mathews

## 2012-08-09 ENCOUNTER — Ambulatory Visit (INDEPENDENT_AMBULATORY_CARE_PROVIDER_SITE_OTHER): Payer: Medicare Other | Admitting: Surgery

## 2012-08-09 ENCOUNTER — Encounter (INDEPENDENT_AMBULATORY_CARE_PROVIDER_SITE_OTHER): Payer: Self-pay | Admitting: Surgery

## 2012-08-09 VITALS — BP 132/80 | HR 70 | Temp 98.7°F | Resp 18 | Ht 69.0 in | Wt 212.2 lb

## 2012-08-09 DIAGNOSIS — K4020 Bilateral inguinal hernia, without obstruction or gangrene, not specified as recurrent: Secondary | ICD-10-CM | POA: Insufficient documentation

## 2012-08-09 DIAGNOSIS — K420 Umbilical hernia with obstruction, without gangrene: Secondary | ICD-10-CM | POA: Diagnosis not present

## 2012-08-09 DIAGNOSIS — E66811 Obesity, class 1: Secondary | ICD-10-CM | POA: Insufficient documentation

## 2012-08-09 DIAGNOSIS — E669 Obesity, unspecified: Secondary | ICD-10-CM

## 2012-08-09 DIAGNOSIS — K436 Other and unspecified ventral hernia with obstruction, without gangrene: Secondary | ICD-10-CM | POA: Insufficient documentation

## 2012-08-09 DIAGNOSIS — E663 Overweight: Secondary | ICD-10-CM | POA: Insufficient documentation

## 2012-08-09 DIAGNOSIS — K4090 Unilateral inguinal hernia, without obstruction or gangrene, not specified as recurrent: Secondary | ICD-10-CM

## 2012-08-09 DIAGNOSIS — M6208 Separation of muscle (nontraumatic), other site: Secondary | ICD-10-CM

## 2012-08-09 HISTORY — DX: Obesity, unspecified: E66.9

## 2012-08-09 HISTORY — DX: Bilateral inguinal hernia, without obstruction or gangrene, not specified as recurrent: K40.20

## 2012-08-09 NOTE — Progress Notes (Addendum)
Subjective:     Patient ID: Ryan Mathews, male   DOB: [birth year], 67 y.o.   MRN: [id number]  HPI  Ryan Mathews  [birth year] [id number]  Patient Care Team: [name], MD as PCP - General (Family Medicine) [name], MD as Consulting Physician (Pulmonary Disease)  This patient is a 67 y.o.male who presents today for surgical evaluation at the request of Dr. Vassie Loll.   Reason for visit: Umbilical hernia  Pleasant active male.  Obese.  Former smoker.  Mild COPD but excellent exercise tolerance.  Does a lot of heavy lifting and moving.  Has noticed a lump at his bellybutton for 15 years.  Used to be reducible.  Now fixed.  Gradually getting larger.  Trying to get his health in better order.  Establish with primary care physician.  Interested in having surgery.  Never had a colonoscopy.  No history of skin infections.  Can hall up three flights of stairs without difficulty.  Daily bowel movements.  Patient Active Problem List   Diagnosis Date Noted  . BACTERIAL PNEUMONIA 08/06/2009  . C O P D 08/05/2009    Past Medical History  Diagnosis Date  . COPD (chronic obstructive pulmonary disease)     Past Surgical History  Procedure Laterality Date  . None      History   Social History  . Marital Status: Married    Spouse Name: N/A    Number of Children: N/A  . Years of Education: N/A   Occupational History  . Not on file.   Social History Main Topics  . Smoking status: Former Smoker -- 2.00 packs/day for 40 years    Types: Cigarettes    Quit date: 03/14/2008  . Smokeless tobacco: Never Used  . Alcohol Use: No  . Drug Use: No  . Sexually Active: Not on file   Other Topics Concern  . Not on file   Social History Narrative  . No narrative on file    Family History  Problem Relation Age of Onset  . Cancer Mother     breast ca  . Cancer Sister     lung ca    Current Outpatient Prescriptions  Medication Sig Dispense Refill  . b complex vitamins  tablet Take 1 tablet by mouth daily.      Marland Kitchen omeprazole-sodium bicarbonate (ZEGERID) 40-1100 MG per capsule Take 1 capsule by mouth daily as needed.      . vitamin C (ASCORBIC ACID) 500 MG tablet Take 500 mg by mouth daily.       No current facility-administered medications for this visit.     No Known Allergies  BP 132/80  Pulse 70  Temp(Src) 98.7 F (37.1 C)  Resp 18  Ht 5\' 9"  (1.753 m)  Wt 212 lb 3.2 oz (96.253 kg)  BMI 31.32 kg/m2  Dg Chest 2 View  08/02/2012   *RADIOLOGY REPORT*  Clinical Data: COPD  CHEST - 2 VIEW  Comparison: 03/28/2011  Findings: Chronic interstitial markings with bibasilar scarring. No focal consolidation.  No pleural effusion or pneumothorax.  The heart is normal in size.  Mild degenerative changes of the visualized thoracolumbar spine.  IMPRESSION: No evidence of acute cardiopulmonary disease.   Original Report Authenticated By: Charline Bills, M.D.     Review of Systems  Constitutional: Negative for fever, chills and diaphoresis.  HENT: Negative for nosebleeds, sore throat, facial swelling, mouth sores, trouble swallowing and ear discharge.   Eyes: Negative for photophobia, discharge and visual  disturbance.  Respiratory: Negative for choking, chest tightness, shortness of breath and stridor.   Cardiovascular: Negative for chest pain and palpitations.        No exertional chest/neck/shoulder/arm pain.  Patient can walk up 3-4 flights of stairs carrying granite blocks without difficulty.    Gastrointestinal: Negative for nausea, vomiting, abdominal pain, diarrhea, constipation, blood in stool, abdominal distention, anal bleeding and rectal pain.  Endocrine: Negative for cold intolerance and heat intolerance.  Genitourinary: Negative for dysuria, urgency, difficulty urinating and testicular pain.  Musculoskeletal: Negative for myalgias, back pain, arthralgias and gait problem.  Skin: Negative for color change, pallor, rash and wound.    Allergic/Immunologic: Negative for environmental allergies and food allergies.  Neurological: Negative for dizziness, speech difficulty, weakness, numbness and headaches.  Hematological: Negative for adenopathy. Does not bruise/bleed easily.  Psychiatric/Behavioral: Negative for hallucinations, confusion and agitation.       Objective:   Physical Exam  Constitutional: He is oriented to person, place, and time. He appears well-developed and well-nourished. No distress.  HENT:  Head: Normocephalic.  Mouth/Throat: Oropharynx is clear and moist. No oropharyngeal exudate.  Eyes: Conjunctivae and EOM are normal. Pupils are equal, round, and reactive to light. No scleral icterus.  Neck: Normal range of motion. Neck supple. No tracheal deviation present.  Cardiovascular: Normal rate, regular rhythm and intact distal pulses.   Pulmonary/Chest: Effort normal and breath sounds normal. No respiratory distress.  Abdominal: Soft. He exhibits no distension. There is no tenderness. There is no rigidity, no guarding, no tenderness at McBurney's point and negative Murphy's sign. A hernia is present. Hernia confirmed positive in the ventral area and confirmed positive in the right inguinal area. Hernia confirmed negative in the left inguinal area.    Obese but soft  Musculoskeletal: Normal range of motion. He exhibits no tenderness.  Lymphadenopathy:    He has no cervical adenopathy.       Right: No inguinal adenopathy present.       Left: No inguinal adenopathy present.  Neurological: He is alert and oriented to person, place, and time. No cranial nerve deficit. He exhibits normal muscle tone. Coordination normal.  Skin: Skin is warm and dry. No rash noted. He is not diaphoretic. No erythema. No pallor.  Psychiatric: He has a normal mood and affect. His behavior is normal. Judgment and thought content normal.       Assessment:     Incarcerated umbilical ventral hernia.  Small right inguinal  hernia.  Need for screening colonoscopy.     Plan:     I recommended he get surgical repair.  I think he needs mesh with his heavy activity and the larger hernia.  Would have to remove much of the redundant thinned out umbilical skin for umbilicoplasty/reconstruction.  Plan TAPP repair of right inguinal hernia at the same time.  Suspect he is going to need at least an overnight stay:  The anatomy & physiology of the abdominal wall was discussed.  The pathophysiology of hernias was discussed.  Natural history risks without surgery including progeressive enlargement, pain, incarceration & strangulation was discussed.   Contributors to complications such as smoking, obesity, diabetes, prior surgery, etc were discussed.   I feel the risks of no intervention will lead to serious problems that outweigh the operative risks; therefore, I recommended surgery to reduce and repair the hernia.  I explained laparoscopic techniques with possible need for an open approach.  I noted the probable use of mesh to patch and/or buttress the  hernia repair  Risks such as bleeding, infection, abscess, need for further treatment, heart attack, death, and other risks were discussed.  I noted a good likelihood this will help address the problem.   Goals of post-operative recovery were discussed as well.  Possibility that this will not correct all symptoms was explained.  I stressed the importance of low-impact activity, aggressive pain control, avoiding constipation, & not pushing through pain to minimize risk of post-operative chronic pain or injury. Possibility of reherniation especially with smoking, obesity, diabetes, immunosuppression, and other health conditions was discussed.  We will work to minimize complications.     An educational handout further explaining the pathology & treatment options was given as well.  Questions were answered.  The patient expresses understanding & wishes to proceed with surgery.  He needs  to get colonoscopy.  We will try and keep it in the [facility] [place] family

## 2012-08-09 NOTE — Patient Instructions (Signed)
See the Handout(s) we gave you.  Consider surgery.  Please call our office at [phone] if you wish to schedule surgery or if you have further questions / concerns.   Hernia A hernia occurs when an internal organ pushes out through a weak spot in the abdominal wall. Hernias most commonly occur in the groin and around the navel. Hernias often can be pushed back into place (reduced). Most hernias tend to get worse over time. Some abdominal hernias can get stuck in the opening (irreducible or incarcerated hernia) and cannot be reduced. An irreducible abdominal hernia which is tightly squeezed into the opening is at risk for impaired blood supply (strangulated hernia). A strangulated hernia is a medical emergency. Because of the risk for an irreducible or strangulated hernia, surgery may be recommended to repair a hernia. CAUSES   Heavy lifting.  Prolonged coughing.  Straining to have a bowel movement.  A cut (incision) made during an abdominal surgery. HOME CARE INSTRUCTIONS   Bed rest is not required. You may continue your normal activities.  Avoid lifting more than 10 pounds (4.5 kg) or straining.  Cough gently. If you are a smoker it is best to stop. Even the best hernia repair can break down with the continual strain of coughing. Even if you do not have your hernia repaired, a cough will continue to aggravate the problem.  Do not wear anything tight over your hernia. Do not try to keep it in with an outside bandage or truss. These can damage abdominal contents if they are trapped within the hernia sac.  Eat a normal diet.  Avoid constipation. Straining over long periods of time will increase hernia size and encourage breakdown of repairs. If you cannot do this with diet alone, stool softeners may be used. SEEK IMMEDIATE MEDICAL CARE IF:   You have a fever.  You develop increasing abdominal pain.  You feel nauseous or vomit.  Your hernia is stuck outside the abdomen, looks  discolored, feels hard, or is tender.  You have any changes in your bowel habits or in the hernia that are unusual for you.  You have increased pain or swelling around the hernia.  You cannot push the hernia back in place by applying gentle pressure while lying down. MAKE SURE YOU:   Understand these instructions.  Will watch your condition.  Will get help right away if you are not doing well or get worse. Document Released: 02/28/2005 Document Revised: 05/23/2011 Document Reviewed: 10/18/2007 Exit[employer] Patient Information 2014 [employer].  HERNIA REPAIR: POST OP INSTRUCTIONS  1. DIET: Follow a light bland diet the first 24 hours after arrival home, such as soup, liquids, crackers, etc.  Be sure to include lots of fluids daily.  Avoid fast food or heavy meals as your are more likely to get nauseated.  Eat a low fat the next few days after surgery. 2. Take your usually prescribed home medications unless otherwise directed. 3. PAIN CONTROL: a. Pain is best controlled by a usual combination of three different methods TOGETHER: i. Ice/Heat ii. Over the counter pain medication iii. Prescription pain medication b. Most patients will experience some swelling and bruising around the hernia(s) such as the bellybutton, groins, or old incisions.  Ice packs or heating pads (30-60 minutes up to 6 times a day) will help. Use ice for the first few days to help decrease swelling and bruising, then switch to heat to help relax tight/sore spots and speed recovery.  Some people prefer to   use ice alone, heat alone, alternating between ice & heat.  Experiment to what works for you.  Swelling and bruising can take several weeks to resolve.   c. It is helpful to take an over-the-counter pain medication regularly for the first few weeks.  Choose one of the following that works best for you: i. Naproxen (Aleve, etc)  Two 220mg tabs twice a day ii. Ibuprofen (Advil, etc) Three 200mg tabs four times a day  (every meal & bedtime) iii. Acetaminophen (Tylenol, etc) 325-650mg four times a day (every meal & bedtime) d. A  prescription for pain medication should be given to you upon discharge.  Take your pain medication as prescribed.  i. If you are having problems/concerns with the prescription medicine (does not control pain, nausea, vomiting, rash, itching, etc), please call us [phone] to see if we need to switch you to a different pain medicine that will work better for you and/or control your side effect better. ii. If you need a refill on your pain medication, please contact your pharmacy.  They will contact our office to request authorization. Prescriptions will not be filled after 5 pm or on week-ends. 4. Avoid getting constipated.  Between the surgery and the pain medications, it is common to experience some constipation.  Increasing fluid intake and taking a fiber supplement (such as Metamucil, Citrucel, FiberCon, MiraLax, etc) 1-2 times a day regularly will usually help prevent this problem from occurring.  A mild laxative (prune juice, Milk of Magnesia, MiraLax, etc) should be taken according to package directions if there are no bowel movements after 48 hours.   5. Wash / shower every day.  You may shower over the dressings as they are waterproof.   6. Remove your waterproof bandages 5 days after surgery.  You may leave the incision open to air.  You may replace a dressing/Band-Aid to cover the incision for comfort if you wish.  Continue to shower over incision(s) after the dressing is off.    7. ACTIVITIES as tolerated:   a. You may resume regular (light) daily activities beginning the next day-such as daily self-care, walking, climbing stairs-gradually increasing activities as tolerated.  If you can walk 30 minutes without difficulty, it is safe to try more intense activity such as jogging, treadmill, bicycling, low-impact aerobics, swimming, etc. b. Save the most intensive and strenuous  activity for last such as sit-ups, heavy lifting, contact sports, etc  Refrain from any heavy lifting or straining until you are off narcotics for pain control.   c. DO NOT PUSH THROUGH PAIN.  Let pain be your guide: If it hurts to do something, don't do it.  Pain is your body warning you to avoid that activity for another week until the pain goes down. d. You may drive when you are no longer taking prescription pain medication, you can comfortably wear a seatbelt, and you can safely maneuver your car and apply brakes. e. You may have sexual intercourse when it is comfortable.  8. FOLLOW UP in our office a. Please call [facility] at [phone] to set up an appointment to see your surgeon in the office for a follow-up appointment approximately 2-3 weeks after your surgery. b. Make sure that you call for this appointment the day you arrive home to insure a convenient appointment time. 9.  IF YOU HAVE DISABILITY OR FAMILY LEAVE FORMS, BRING THEM TO THE OFFICE FOR PROCESSING.  DO NOT GIVE THEM TO YOUR DOCTOR.  WHEN TO CALL US (  [phone]: 1. Poor pain control 2. Reactions / problems with new medications (rash/itching, nausea, etc)  3. Fever over 101.5 F (38.5 C) 4. Inability to urinate 5. Nausea and/or vomiting 6. Worsening swelling or bruising 7. Continued bleeding from incision. 8. Increased pain, redness, or drainage from the incision   The clinic staff is available to answer your questions during regular business hours (8:30am-5pm).  Please don't hesitate to call and ask to speak to one of our nurses for clinical concerns.   If you have a medical emergency, go to the nearest emergency room or call 911.  A surgeon from [facility] is always on call at the hospitals in [place]  [facility] [address], [address], [place], [place]  [address] ?  [address] [address], [place], [place]   [address] MAIN: [phone] ? TOLL FREE: [phone] ? FAX: [phone] [email or web address]  

## 2012-08-23 ENCOUNTER — Encounter (HOSPITAL_COMMUNITY): Payer: Self-pay | Admitting: Pharmacy Technician

## 2012-08-27 ENCOUNTER — Encounter (HOSPITAL_COMMUNITY)
Admission: RE | Admit: 2012-08-27 | Discharge: 2012-08-27 | Disposition: A | Discharge status: Home / Self Care | Payer: Medicare Other | Source: Ambulatory Visit | Attending: Surgery | Admitting: Surgery

## 2012-08-27 ENCOUNTER — Encounter (HOSPITAL_COMMUNITY): Payer: Self-pay

## 2012-08-27 DIAGNOSIS — R599 Enlarged lymph nodes, unspecified: Secondary | ICD-10-CM | POA: Diagnosis not present

## 2012-08-27 DIAGNOSIS — D1779 Benign lipomatous neoplasm of other sites: Secondary | ICD-10-CM | POA: Diagnosis not present

## 2012-08-27 DIAGNOSIS — K4020 Bilateral inguinal hernia, without obstruction or gangrene, not specified as recurrent: Secondary | ICD-10-CM | POA: Diagnosis not present

## 2012-08-27 DIAGNOSIS — K660 Peritoneal adhesions (postprocedural) (postinfection): Secondary | ICD-10-CM | POA: Diagnosis not present

## 2012-08-27 DIAGNOSIS — Z87891 Personal history of nicotine dependence: Secondary | ICD-10-CM | POA: Diagnosis not present

## 2012-08-27 DIAGNOSIS — J449 Chronic obstructive pulmonary disease, unspecified: Secondary | ICD-10-CM | POA: Diagnosis not present

## 2012-08-27 DIAGNOSIS — K420 Umbilical hernia with obstruction, without gangrene: Secondary | ICD-10-CM | POA: Diagnosis not present

## 2012-08-27 DIAGNOSIS — Z79899 Other long term (current) drug therapy: Secondary | ICD-10-CM | POA: Diagnosis not present

## 2012-08-27 HISTORY — DX: Gastro-esophageal reflux disease without esophagitis: K21.9

## 2012-08-27 HISTORY — DX: Anesthesia of skin: R20.0

## 2012-08-27 LAB — CBC
Hemoglobin: 15 g/dL (ref 13.0–17.0)
MCH: 31.9 pg (ref 26.0–34.0)
RBC: 4.7 MIL/uL (ref 4.22–5.81)

## 2012-08-27 NOTE — Patient Instructions (Addendum)
Ryan Mathews  08/27/2012                           YOUR PROCEDURE IS SCHEDULED ON: 09/06/12               PLEASE REPORT TO SHORT STAY CENTER AT : 9:30 AM               CALL THIS NUMBER IF ANY PROBLEMS THE DAY OF SURGERY :               [phone]                      REMEMBER:   Do not eat food or drink liquids AFTER MIDNIGHT  May have clear liquids UNTIL 6 HOURS BEFORE SURGERY (6:00 AM)  Clear liquids include soda, tea, black coffee, apple or grape juice, broth.  Take these medicines the morning of surgery with A SIP OF WATER: ZEGRID    Do not wear jewelry, make-up   Do not wear lotions, powders, or perfumes.   Do not shave legs or underarms 12 hrs. before surgery (men may shave face)  Do not bring valuables to the hospital.  Contacts, dentures or bridgework may not be worn into surgery.  Leave suitcase in the car. After surgery it may be brought to your room.  For patients admitted to the hospital more than one night, checkout time is 11:00                          The day of discharge.   Patients discharged the day of surgery will not be allowed to drive home                             If going home same day of surgery, must have someone stay with you first                           24 hrs at home and arrange for some one to drive you home from hospital.    Special Instructions:   Please read over the following fact sheets that you were given:               1. MRSA  INFORMATION                      2. [place] PREPARING FOR SURGERY SHEET               3. STOP ASPIRIN AND HERBAL MEDS 5 DAYS PREOP                                                 X_____________________________________________________________________        Failure to follow these instructions may result in cancellation of your surgery

## 2012-09-06 ENCOUNTER — Encounter (HOSPITAL_COMMUNITY): Payer: Self-pay | Admitting: Anesthesiology

## 2012-09-06 ENCOUNTER — Observation Stay (HOSPITAL_COMMUNITY)
Admission: RE | Admit: 2012-09-06 | Discharge: 2012-09-07 | Disposition: A | Discharge status: Home / Self Care | Payer: Medicare Other | Source: Ambulatory Visit | Attending: Surgery | Admitting: Surgery

## 2012-09-06 ENCOUNTER — Encounter (HOSPITAL_COMMUNITY): Payer: Self-pay | Admitting: *Deleted

## 2012-09-06 ENCOUNTER — Encounter (HOSPITAL_COMMUNITY)
Admission: RE | Disposition: A | Discharge status: Home / Self Care | Payer: Self-pay | Source: Ambulatory Visit | Attending: Surgery

## 2012-09-06 ENCOUNTER — Ambulatory Visit (HOSPITAL_COMMUNITY): Payer: Medicare Other | Admitting: Anesthesiology

## 2012-09-06 DIAGNOSIS — K436 Other and unspecified ventral hernia with obstruction, without gangrene: Secondary | ICD-10-CM | POA: Diagnosis not present

## 2012-09-06 DIAGNOSIS — K4090 Unilateral inguinal hernia, without obstruction or gangrene, not specified as recurrent: Secondary | ICD-10-CM | POA: Diagnosis not present

## 2012-09-06 DIAGNOSIS — D1779 Benign lipomatous neoplasm of other sites: Secondary | ICD-10-CM | POA: Insufficient documentation

## 2012-09-06 DIAGNOSIS — J449 Chronic obstructive pulmonary disease, unspecified: Secondary | ICD-10-CM | POA: Insufficient documentation

## 2012-09-06 DIAGNOSIS — E669 Obesity, unspecified: Secondary | ICD-10-CM | POA: Diagnosis present

## 2012-09-06 DIAGNOSIS — K4020 Bilateral inguinal hernia, without obstruction or gangrene, not specified as recurrent: Secondary | ICD-10-CM | POA: Diagnosis present

## 2012-09-06 DIAGNOSIS — E66811 Obesity, class 1: Secondary | ICD-10-CM | POA: Diagnosis present

## 2012-09-06 DIAGNOSIS — K660 Peritoneal adhesions (postprocedural) (postinfection): Secondary | ICD-10-CM | POA: Insufficient documentation

## 2012-09-06 DIAGNOSIS — E663 Overweight: Secondary | ICD-10-CM | POA: Diagnosis present

## 2012-09-06 DIAGNOSIS — K219 Gastro-esophageal reflux disease without esophagitis: Secondary | ICD-10-CM | POA: Diagnosis not present

## 2012-09-06 DIAGNOSIS — Z87891 Personal history of nicotine dependence: Secondary | ICD-10-CM | POA: Insufficient documentation

## 2012-09-06 DIAGNOSIS — R599 Enlarged lymph nodes, unspecified: Secondary | ICD-10-CM | POA: Diagnosis not present

## 2012-09-06 DIAGNOSIS — J4489 Other specified chronic obstructive pulmonary disease: Secondary | ICD-10-CM | POA: Insufficient documentation

## 2012-09-06 DIAGNOSIS — K420 Umbilical hernia with obstruction, without gangrene: Secondary | ICD-10-CM | POA: Diagnosis not present

## 2012-09-06 DIAGNOSIS — Z79899 Other long term (current) drug therapy: Secondary | ICD-10-CM | POA: Insufficient documentation

## 2012-09-06 HISTORY — PX: LAPAROSCOPIC INGUINAL HERNIA WITH UMBILICAL HERNIA: SHX5658

## 2012-09-06 HISTORY — PX: INSERTION OF MESH: SHX5868

## 2012-09-06 SURGERY — LAPAROSCOPIC INGUINAL HERNIA WITH UMBILICAL HERNIA
Anesthesia: General | Site: Groin | Laterality: Bilateral | Wound class: Clean

## 2012-09-06 MED ORDER — LIP MEDEX EX OINT
1.0000 "application " | TOPICAL_OINTMENT | Freq: Two times a day (BID) | CUTANEOUS | Status: DC
  Administered 2012-09-07: 1 via TOPICAL

## 2012-09-06 MED ORDER — MAGIC MOUTHWASH
15.0000 mL | Freq: Four times a day (QID) | ORAL | Status: DC | PRN
  Filled 2012-09-06: qty 15

## 2012-09-06 MED ORDER — BUPIVACAINE-EPINEPHRINE 0.25% -1:200000 IJ SOLN
INTRAMUSCULAR | Status: AC
  Filled 2012-09-06: qty 1

## 2012-09-06 MED ORDER — BUPIVACAINE 0.25 % ON-Q PUMP DUAL CATH 300 ML
300.0000 mL | INJECTION | Status: DC
  Filled 2012-09-06: qty 300

## 2012-09-06 MED ORDER — ALUM & MAG HYDROXIDE-SIMETH 200-200-20 MG/5ML PO SUSP: 30.0000 mL | Freq: Four times a day (QID) | ORAL | Status: DC | PRN

## 2012-09-06 MED ORDER — PROPOFOL 10 MG/ML IV BOLUS
INTRAVENOUS | Status: DC | PRN
  Administered 2012-09-06: 200 mg via INTRAVENOUS

## 2012-09-06 MED ORDER — BUPIVACAINE 0.25 % ON-Q PUMP DUAL CATH 300 ML
INJECTION | Status: DC | PRN
  Administered 2012-09-06: 300 mL

## 2012-09-06 MED ORDER — NAPROXEN 500 MG PO TABS
500.0000 mg | ORAL_TABLET | Freq: Two times a day (BID) | ORAL | Status: DC
  Filled 2012-09-06 (×3): qty 1

## 2012-09-06 MED ORDER — BUPIVACAINE-EPINEPHRINE 0.25% -1:200000 IJ SOLN
INTRAMUSCULAR | Status: DC | PRN
  Administered 2012-09-06: 90 mL

## 2012-09-06 MED ORDER — CEFAZOLIN SODIUM-DEXTROSE 2-3 GM-% IV SOLR
2.0000 g | INTRAVENOUS | Status: AC
  Administered 2012-09-06: 2 g via INTRAVENOUS

## 2012-09-06 MED ORDER — EPHEDRINE SULFATE 50 MG/ML IJ SOLN
INTRAMUSCULAR | Status: DC | PRN
  Administered 2012-09-06: 5 mg via INTRAVENOUS

## 2012-09-06 MED ORDER — SODIUM CHLORIDE 0.9 % IV SOLN: 250.0000 mL | INTRAVENOUS | Status: DC | PRN

## 2012-09-06 MED ORDER — OXYCODONE HCL 5 MG PO TABS: 5.0000 mg | ORAL_TABLET | ORAL | Status: DC | PRN

## 2012-09-06 MED ORDER — MIDAZOLAM HCL 5 MG/5ML IJ SOLN
INTRAMUSCULAR | Status: DC | PRN
  Administered 2012-09-06: 2 mg via INTRAVENOUS

## 2012-09-06 MED ORDER — SODIUM CHLORIDE 0.9 % IJ SOLN: 3.0000 mL | Freq: Two times a day (BID) | INTRAMUSCULAR | Status: DC

## 2012-09-06 MED ORDER — LACTATED RINGERS IR SOLN
Status: DC | PRN
  Administered 2012-09-06: 1000 mL

## 2012-09-06 MED ORDER — PANTOPRAZOLE SODIUM 40 MG PO TBEC
80.0000 mg | DELAYED_RELEASE_TABLET | Freq: Every day | ORAL | Status: DC
  Administered 2012-09-07: 80 mg via ORAL
  Filled 2012-09-06: qty 2

## 2012-09-06 MED ORDER — NAPROXEN 500 MG PO TABS: 500.0000 mg | ORAL_TABLET | Freq: Two times a day (BID) | ORAL | Status: DC

## 2012-09-06 MED ORDER — POLYETHYLENE GLYCOL 3350 17 G PO PACK
17.0000 g | PACK | Freq: Two times a day (BID) | ORAL | Status: DC | PRN
  Filled 2012-09-06: qty 1

## 2012-09-06 MED ORDER — OXYCODONE HCL 5 MG PO TABS
5.0000 mg | ORAL_TABLET | ORAL | Status: DC | PRN
  Administered 2012-09-06 – 2012-09-07 (×4): 10 mg via ORAL
  Filled 2012-09-06 (×4): qty 2

## 2012-09-06 MED ORDER — VITAMIN C 500 MG PO TABS
500.0000 mg | ORAL_TABLET | Freq: Every day | ORAL | Status: DC
  Administered 2012-09-07: 500 mg via ORAL
  Filled 2012-09-06: qty 1

## 2012-09-06 MED ORDER — CHLORHEXIDINE GLUCONATE 4 % EX LIQD
1.0000 "application " | Freq: Once | CUTANEOUS | Status: DC
  Filled 2012-09-06: qty 15

## 2012-09-06 MED ORDER — ONDANSETRON HCL 4 MG/2ML IJ SOLN: 4.0000 mg | Freq: Four times a day (QID) | INTRAMUSCULAR | Status: DC | PRN

## 2012-09-06 MED ORDER — FENTANYL CITRATE 0.05 MG/ML IJ SOLN
INTRAMUSCULAR | Status: DC | PRN
  Administered 2012-09-06: 100 ug via INTRAVENOUS
  Administered 2012-09-06: 25 ug via INTRAVENOUS
  Administered 2012-09-06 (×2): 50 ug via INTRAVENOUS
  Administered 2012-09-06: 25 ug via INTRAVENOUS

## 2012-09-06 MED ORDER — ACETAMINOPHEN 650 MG RE SUPP
650.0000 mg | RECTAL | Status: DC | PRN
  Filled 2012-09-06: qty 1

## 2012-09-06 MED ORDER — BUPIVACAINE-EPINEPHRINE PF 0.25-1:200000 % IJ SOLN
INTRAMUSCULAR | Status: AC
  Filled 2012-09-06: qty 30

## 2012-09-06 MED ORDER — FENTANYL CITRATE 0.05 MG/ML IJ SOLN: 25.0000 ug | INTRAMUSCULAR | Status: DC | PRN

## 2012-09-06 MED ORDER — HYDROMORPHONE HCL PF 1 MG/ML IJ SOLN: 0.2500 mg | INTRAMUSCULAR | Status: DC | PRN

## 2012-09-06 MED ORDER — DIPHENHYDRAMINE HCL 50 MG/ML IJ SOLN: 12.5000 mg | Freq: Four times a day (QID) | INTRAMUSCULAR | Status: DC | PRN

## 2012-09-06 MED ORDER — LACTATED RINGERS IV SOLN
INTRAVENOUS | Status: DC
  Administered 2012-09-06: 1000 mL via INTRAVENOUS
  Administered 2012-09-06: 14:00:00 via INTRAVENOUS

## 2012-09-06 MED ORDER — KETOROLAC TROMETHAMINE 30 MG/ML IJ SOLN: 15.0000 mg | Freq: Once | INTRAMUSCULAR | Status: DC | PRN

## 2012-09-06 MED ORDER — 0.9 % SODIUM CHLORIDE (POUR BTL) OPTIME
TOPICAL | Status: DC | PRN
  Administered 2012-09-06: 1000 mL

## 2012-09-06 MED ORDER — SODIUM CHLORIDE 0.9 % IJ SOLN: 3.0000 mL | INTRAMUSCULAR | Status: DC | PRN

## 2012-09-06 MED ORDER — ONDANSETRON HCL 4 MG/2ML IJ SOLN
INTRAMUSCULAR | Status: DC | PRN
  Administered 2012-09-06: 4 mg via INTRAVENOUS

## 2012-09-06 MED ORDER — NEOSTIGMINE METHYLSULFATE 1 MG/ML IJ SOLN
INTRAMUSCULAR | Status: DC | PRN
  Administered 2012-09-06: 5 mg via INTRAVENOUS

## 2012-09-06 MED ORDER — ACETAMINOPHEN 325 MG PO TABS: 650.0000 mg | ORAL_TABLET | ORAL | Status: DC | PRN

## 2012-09-06 MED ORDER — GLYCOPYRROLATE 0.2 MG/ML IJ SOLN
INTRAMUSCULAR | Status: DC | PRN
  Administered 2012-09-06: 0.2 mg via INTRAVENOUS
  Administered 2012-09-06: 8 mg via INTRAVENOUS

## 2012-09-06 MED ORDER — DEXAMETHASONE SODIUM PHOSPHATE 10 MG/ML IJ SOLN
INTRAMUSCULAR | Status: DC | PRN
  Administered 2012-09-06: 10 mg via INTRAVENOUS

## 2012-09-06 MED ORDER — BISACODYL 10 MG RE SUPP: 10.0000 mg | Freq: Two times a day (BID) | RECTAL | Status: DC | PRN

## 2012-09-06 MED ORDER — CISATRACURIUM BESYLATE (PF) 10 MG/5ML IV SOLN
INTRAVENOUS | Status: DC | PRN
  Administered 2012-09-06: 2 mg via INTRAVENOUS
  Administered 2012-09-06: 12 mg via INTRAVENOUS
  Administered 2012-09-06: 2 mg via INTRAVENOUS
  Administered 2012-09-06: 4 mg via INTRAVENOUS
  Administered 2012-09-06: 2 mg via INTRAVENOUS

## 2012-09-06 MED ORDER — CHLORHEXIDINE GLUCONATE 4 % EX LIQD: 1.0000 "application " | Freq: Once | CUTANEOUS | Status: DC

## 2012-09-06 MED ORDER — KETOROLAC TROMETHAMINE 30 MG/ML IJ SOLN
INTRAMUSCULAR | Status: DC | PRN
  Administered 2012-09-06: 30 mg via INTRAVENOUS

## 2012-09-06 MED ORDER — CEFAZOLIN SODIUM-DEXTROSE 2-3 GM-% IV SOLR
INTRAVENOUS | Status: AC
  Filled 2012-09-06: qty 50

## 2012-09-06 MED ORDER — PROMETHAZINE HCL 25 MG/ML IJ SOLN: 6.2500 mg | INTRAMUSCULAR | Status: DC | PRN

## 2012-09-06 SURGICAL SUPPLY — 45 items
BINDER ABD UNIV 12 45-62 (WOUND CARE) ×2 IMPLANT
BINDER ABDOMINAL 46IN 62IN (WOUND CARE) ×3
CANISTER SUCTION 2500CC (MISCELLANEOUS) IMPLANT
CATH KIT ON-Q SILVERSOAK 7.5IN (CATHETERS) ×6 IMPLANT
CHLORAPREP W/TINT 26ML (MISCELLANEOUS) ×3 IMPLANT
CLOTH BEACON ORANGE TIMEOUT ST (SAFETY) ×3 IMPLANT
DECANTER SPIKE VIAL GLASS SM (MISCELLANEOUS) ×3 IMPLANT
DEVICE SECURE STRAP 25 ABSORB (INSTRUMENTS) ×3 IMPLANT
DISSECTOR BLUNT TIP ENDO 5MM (MISCELLANEOUS) IMPLANT
DRAPE LAPAROSCOPIC ABDOMINAL (DRAPES) ×3 IMPLANT
DRAPE WARM FLUID 44X44 (DRAPE) ×3 IMPLANT
DRSG TEGADERM 2-3/8X2-3/4 SM (GAUZE/BANDAGES/DRESSINGS) ×9 IMPLANT
DRSG TEGADERM 4X4.75 (GAUZE/BANDAGES/DRESSINGS) ×3 IMPLANT
ELECT REM PT RETURN 9FT ADLT (ELECTROSURGICAL) ×3
ELECTRODE REM PT RTRN 9FT ADLT (ELECTROSURGICAL) ×2 IMPLANT
GAUZE SPONGE 2X2 8PLY STRL LF (GAUZE/BANDAGES/DRESSINGS) ×2 IMPLANT
GLOVE BIOGEL PI IND STRL 7.0 (GLOVE) ×2 IMPLANT
GLOVE BIOGEL PI INDICATOR 7.0 (GLOVE) ×1
GLOVE ECLIPSE 8.0 STRL XLNG CF (GLOVE) ×3 IMPLANT
GLOVE INDICATOR 8.0 STRL GRN (GLOVE) ×6 IMPLANT
GOWN STRL NON-REIN LRG LVL3 (GOWN DISPOSABLE) ×3 IMPLANT
GOWN STRL REIN XL XLG (GOWN DISPOSABLE) ×12 IMPLANT
KIT BASIN OR (CUSTOM PROCEDURE TRAY) ×3 IMPLANT
MARKER SKIN DUAL TIP RULER LAB (MISCELLANEOUS) ×3 IMPLANT
MESH ULTRAPRO 6X6 15CM15CM (Mesh General) ×6 IMPLANT
MESH VENTRALIGHT ST 7X9N (Mesh General) ×3 IMPLANT
NEEDLE INSUFFLATION 14GA 120MM (NEEDLE) IMPLANT
SCISSORS ENDO CVD 5DCS (MISCELLANEOUS) ×3 IMPLANT
SET IRRIG TUBING LAPAROSCOPIC (IRRIGATION / IRRIGATOR) ×3 IMPLANT
SLEEVE ENDOPATH XCEL 5M (ENDOMECHANICALS) ×3 IMPLANT
SLEEVE XCEL OPT CAN 5 100 (ENDOMECHANICALS) ×6 IMPLANT
SPONGE GAUZE 2X2 STER 10/PKG (GAUZE/BANDAGES/DRESSINGS) ×1
STRIP CLOSURE SKIN 1/2X4 (GAUZE/BANDAGES/DRESSINGS) ×3 IMPLANT
SUT MNCRL AB 4-0 PS2 18 (SUTURE) ×3 IMPLANT
SUT PDS AB 0 CT1 36 (SUTURE) ×3 IMPLANT
SUT VIC AB 3-0 SH 8-18 (SUTURE) ×6 IMPLANT
TACKER 5MM HERNIA 3.5CML NAB (ENDOMECHANICALS) IMPLANT
TOWEL OR 17X26 10 PK STRL BLUE (TOWEL DISPOSABLE) ×3 IMPLANT
TOWEL OR NON WOVEN STRL DISP B (DISPOSABLE) ×3 IMPLANT
TRAY LAP CHOLE (CUSTOM PROCEDURE TRAY) ×3 IMPLANT
TROCAR SLEEVE XCEL 5X75 (ENDOMECHANICALS) IMPLANT
TROCAR XCEL BLADELESS 5X75MML (TROCAR) IMPLANT
TROCAR XCEL BLUNT TIP 100MML (ENDOMECHANICALS) ×3 IMPLANT
TUBING INSUFFLATION 10FT LAP (TUBING) ×3 IMPLANT
TUNNELER SHEATH ON-Q 16GX12 DP (PAIN MANAGEMENT) ×3 IMPLANT

## 2012-09-06 NOTE — Anesthesia Preprocedure Evaluation (Signed)
Anesthesia Evaluation  Patient identified by MRN, date of birth, ID band Patient awake    Reviewed: Allergy & Precautions, H&P , NPO status , Patient's Chart, lab work & pertinent test results  Airway Mallampati: II TM Distance: <3 FB Neck ROM: Full    Dental no notable dental hx.    Pulmonary COPDformer smoker,  Smoking Status: Former Smoker - 80 pack years breath sounds clear to auscultation  + decreased breath sounds      Cardiovascular negative cardio ROS  Rhythm:Regular Rate:Normal     Neuro/Psych negative neurological ROS  negative psych ROS   GI/Hepatic negative GI ROS, Neg liver ROS,   Endo/Other  negative endocrine ROS  Renal/GU negative Renal ROS  negative genitourinary   Musculoskeletal negative musculoskeletal ROS (+)   Abdominal   Peds negative pediatric ROS (+)  Hematology negative hematology ROS (+)   Anesthesia Other Findings   Reproductive/Obstetrics negative OB ROS                           Anesthesia Physical Anesthesia Plan  ASA: II  Anesthesia Plan: General   Post-op Pain Management:    Induction: Intravenous  Airway Management Planned: Oral ETT  Additional Equipment:   Intra-op Plan:   Post-operative Plan: Extubation in OR  Informed Consent: I have reviewed the patients History and Physical, chart, labs and discussed the procedure including the risks, benefits and alternatives for the proposed anesthesia with the patient or authorized representative who has indicated his/her understanding and acceptance.   Dental advisory given  Plan Discussed with: CRNA and Surgeon  Anesthesia Plan Comments:         Anesthesia Quick Evaluation

## 2012-09-06 NOTE — Progress Notes (Signed)
Spoke to Dr. Michaell Cowing reported that patient had copious amounts of serosangionous drainage from RLQ port sites x2, soaked through abd binder and bed sheets, reinforced with gauze. MD states to place ice pack under abd binder.

## 2012-09-06 NOTE — Transfer of Care (Signed)
Immediate Anesthesia Transfer of Care Note  Patient: Ryan Mathews  Procedure(s) Performed: Procedure(s): LAPAROSCOPIC exploration and repair of hernias in bellybutton and bilateral groins with mesh (Bilateral) INSERTION OF MESH (Bilateral)  Patient Location: PACU  Anesthesia Type:General  Level of Consciousness: awake, sedated and patient cooperative  Airway & Oxygen Therapy: Patient Spontanous Breathing and Patient connected to face mask oxygen  Post-op Assessment: Report given to PACU RN and Post -op Vital signs reviewed and stable  Post vital signs: Reviewed and stable  Complications: No apparent anesthesia complications

## 2012-09-06 NOTE — Progress Notes (Signed)
Upon taking pt to short stay, wife spoke to short stay nurse and is uncomfortable taking pt home after surgery; spoke to Dr. Michaell Cowing; OK for pt to spend night in hospital; he has left orders for overnight stay if needed

## 2012-09-06 NOTE — Anesthesia Postprocedure Evaluation (Signed)
  Anesthesia Post-op Note  Patient: Ryan Mathews  Procedure(s) Performed: Procedure(s) (LRB): LAPAROSCOPIC exploration and repair of hernias in bellybutton and bilateral groins with mesh (Bilateral) INSERTION OF MESH (Bilateral)  Patient Location: PACU  Anesthesia Type: General  Level of Consciousness: awake and alert   Airway and Oxygen Therapy: Patient Spontanous Breathing  Post-op Pain: mild  Post-op Assessment: Post-op Vital signs reviewed, Patient's Cardiovascular Status Stable, Respiratory Function Stable, Patent Airway and No signs of Nausea or vomiting  Last Vitals:  Filed Vitals:   09/06/12 1542  BP: 148/84  Pulse: 82  Temp: 36.8 C  Resp: 12    Post-op Vital Signs: stable   Complications: No apparent anesthesia complications

## 2012-09-06 NOTE — H&P (View-Only) (Signed)
Subjective:     Patient ID: Ryan Mathews, male   DOB: 1945-08-25, 67 y.o.   MRN: [id number]  HPI  [name]  [birth year] [id number]  Patient Care Team: [name], MD as PCP - General (Family Medicine) [name], MD as Consulting Physician (Pulmonary Disease)  This patient is a 67 y.o.male who presents today for surgical evaluation at the request of Dr. Vassie Loll.   Reason for visit: Umbilical hernia  Pleasant active male.  Obese.  Former smoker.  Mild COPD but excellent exercise tolerance.  Does a lot of heavy lifting and moving.  Has noticed a lump at his bellybutton for 15 years.  Used to be reducible.  Now fixed.  Gradually getting larger.  Trying to get his health in better order.  Establish with primary care physician.  Interested in having surgery.  Never had a colonoscopy.  No history of skin infections.  Can hall up three flights of stairs without difficulty.  Daily bowel movements.  Patient Active Problem List   Diagnosis Date Noted  . BACTERIAL PNEUMONIA 08/06/2009  . C O P D 08/05/2009    Past Medical History  Diagnosis Date  . COPD (chronic obstructive pulmonary disease)     Past Surgical History  Procedure Laterality Date  . None      History   Social History  . Marital Status: Married    Spouse Name: N/A    Number of Children: N/A  . Years of Education: N/A   Occupational History  . Not on file.   Social History Main Topics  . Smoking status: Former Smoker -- 2.00 packs/day for 40 years    Types: Cigarettes    Quit date: 03/14/2008  . Smokeless tobacco: Never Used  . Alcohol Use: No  . Drug Use: No  . Sexually Active: Not on file   Other Topics Concern  . Not on file   Social History Narrative  . No narrative on file    Family History  Problem Relation Age of Onset  . Cancer Mother     breast ca  . Cancer Sister     lung ca    Current Outpatient Prescriptions  Medication Sig Dispense Refill  . b complex vitamins  tablet Take 1 tablet by mouth daily.      Marland Kitchen omeprazole-sodium bicarbonate (ZEGERID) 40-1100 MG per capsule Take 1 capsule by mouth daily as needed.      . vitamin C (ASCORBIC ACID) 500 MG tablet Take 500 mg by mouth daily.       No current facility-administered medications for this visit.     No Known Allergies  BP 132/80  Pulse 70  Temp(Src) 98.7 F (37.1 C)  Resp 18  Ht 5\' 9"  (1.753 m)  Wt 212 lb 3.2 oz (96.253 kg)  BMI 31.32 kg/m2  Dg Chest 2 View  08/02/2012   *RADIOLOGY REPORT*  Clinical Data: COPD  CHEST - 2 VIEW  Comparison: 03/28/2011  Findings: Chronic interstitial markings with bibasilar scarring. No focal consolidation.  No pleural effusion or pneumothorax.  The heart is normal in size.  Mild degenerative changes of the visualized thoracolumbar spine.  IMPRESSION: No evidence of acute cardiopulmonary disease.   Original Report Authenticated By: Charline Bills, M.D.     Review of Systems  Constitutional: Negative for fever, chills and diaphoresis.  HENT: Negative for nosebleeds, sore throat, facial swelling, mouth sores, trouble swallowing and ear discharge.   Eyes: Negative for photophobia, discharge and visual  disturbance.  Respiratory: Negative for choking, chest tightness, shortness of breath and stridor.   Cardiovascular: Negative for chest pain and palpitations.        No exertional chest/neck/shoulder/arm pain.  Patient can walk up 3-4 flights of stairs carrying granite blocks without difficulty.    Gastrointestinal: Negative for nausea, vomiting, abdominal pain, diarrhea, constipation, blood in stool, abdominal distention, anal bleeding and rectal pain.  Endocrine: Negative for cold intolerance and heat intolerance.  Genitourinary: Negative for dysuria, urgency, difficulty urinating and testicular pain.  Musculoskeletal: Negative for myalgias, back pain, arthralgias and gait problem.  Skin: Negative for color change, pallor, rash and wound.    Allergic/Immunologic: Negative for environmental allergies and food allergies.  Neurological: Negative for dizziness, speech difficulty, weakness, numbness and headaches.  Hematological: Negative for adenopathy. Does not bruise/bleed easily.  Psychiatric/Behavioral: Negative for hallucinations, confusion and agitation.       Objective:   Physical Exam  Constitutional: He is oriented to person, place, and time. He appears well-developed and well-nourished. No distress.  HENT:  Head: Normocephalic.  Mouth/Throat: Oropharynx is clear and moist. No oropharyngeal exudate.  Eyes: Conjunctivae and EOM are normal. Pupils are equal, round, and reactive to light. No scleral icterus.  Neck: Normal range of motion. Neck supple. No tracheal deviation present.  Cardiovascular: Normal rate, regular rhythm and intact distal pulses.   Pulmonary/Chest: Effort normal and breath sounds normal. No respiratory distress.  Abdominal: Soft. He exhibits no distension. There is no tenderness. There is no rigidity, no guarding, no tenderness at McBurney's point and negative Murphy's sign. A hernia is present. Hernia confirmed positive in the ventral area and confirmed positive in the right inguinal area. Hernia confirmed negative in the left inguinal area.    Obese but soft  Musculoskeletal: Normal range of motion. He exhibits no tenderness.  Lymphadenopathy:    He has no cervical adenopathy.       Right: No inguinal adenopathy present.       Left: No inguinal adenopathy present.  Neurological: He is alert and oriented to person, place, and time. No cranial nerve deficit. He exhibits normal muscle tone. Coordination normal.  Skin: Skin is warm and dry. No rash noted. He is not diaphoretic. No erythema. No pallor.  Psychiatric: He has a normal mood and affect. His behavior is normal. Judgment and thought content normal.       Assessment:     Incarcerated umbilical ventral hernia.  Small right inguinal  hernia.  Need for screening colonoscopy.     Plan:     I recommended he get surgical repair.  I think he needs mesh with his heavy activity and the larger hernia.  Would have to remove much of the redundant thinned out umbilical skin for umbilicoplasty/reconstruction.  Plan TAPP repair of right inguinal hernia at the same time.  Suspect he is going to need at least an overnight stay:  The anatomy & physiology of the abdominal wall was discussed.  The pathophysiology of hernias was discussed.  Natural history risks without surgery including progeressive enlargement, pain, incarceration & strangulation was discussed.   Contributors to complications such as smoking, obesity, diabetes, prior surgery, etc were discussed.   I feel the risks of no intervention will lead to serious problems that outweigh the operative risks; therefore, I recommended surgery to reduce and repair the hernia.  I explained laparoscopic techniques with possible need for an open approach.  I noted the probable use of mesh to patch and/or buttress the  hernia repair  Risks such as bleeding, infection, abscess, need for further treatment, heart attack, death, and other risks were discussed.  I noted a good likelihood this will help address the problem.   Goals of post-operative recovery were discussed as well.  Possibility that this will not correct all symptoms was explained.  I stressed the importance of low-impact activity, aggressive pain control, avoiding constipation, & not pushing through pain to minimize risk of post-operative chronic pain or injury. Possibility of reherniation especially with smoking, obesity, diabetes, immunosuppression, and other health conditions was discussed.  We will work to minimize complications.     An educational handout further explaining the pathology & treatment options was given as well.  Questions were answered.  The patient expresses understanding & wishes to proceed with surgery.  He needs  to get colonoscopy.  We will try and keep it in the [facility]

## 2012-09-06 NOTE — Interval H&P Note (Signed)
History and Physical Interval Note:  09/06/2012 11:13 AM  Ryan Mathews  has presented today for surgery, with the diagnosis of incarcerated umbilical and right inguinal henias  The various methods of treatment have been discussed with the patient and family. After consideration of risks, benefits and other options for treatment, the patient has consented to  Procedure(s): LAPAROSCOPIC exploration and repair of hernias in bellybutton and right groin (N/A) as a surgical intervention .  The patient's history has been reviewed, patient examined, no change in status, stable for surgery.  I have reviewed the patient's chart and labs.  Questions were answered to the patient's satisfaction.     Antawan Mchugh C.

## 2012-09-06 NOTE — Progress Notes (Signed)
Oral airway removed; pt MAE x 4 well; arousable

## 2012-09-06 NOTE — Op Note (Signed)
09/06/2012  3:33 PM  PATIENT:  Ryan Mathews  67 y.o. male  Patient Care Team: [name], MD as PCP - General (Family Medicine) [name], MD as Consulting Physician (Pulmonary Disease)  PRE-OPERATIVE DIAGNOSIS:  incarcerated umbilical and right inguinal henias  POST-OPERATIVE DIAGNOSIS:  incarcerated umbilical and bilateral inguinal henias  PROCEDURE:  Procedure(s): LAPAROSCOPIC ventral wall hernia repair with mesh Lap BIH repair with mesh INSERTION OF MESH Umbilicoplasty  SURGEON:  Surgeon(s): Ardeth Sportsman, MD  ASSISTANT: RN   ANESTHESIA:   local and general  EBL:  Total I/O In: 1000 [I.V.:1000] Out: -   Delay start of Pharmacological VTE agent (>24hrs) due to surgical blood loss or risk of bleeding:  no  DRAINS: none   SPECIMEN:  Source of Specimen:  Hernia sac/redundant umbilical skin  DISPOSITION OF SPECIMEN:  N/A  COUNTS:  YES  PLAN OF CARE: Discharge to home after PACU  PATIENT DISPOSITION:  PACU - hemodynamically stable.  INDICATION: Pleasant patient has developed a ventral wall abdominal hernia Incarcerated with probable omentum.  Also least a right and possibly a left inguinal hernia repair.  Recommendation was made for surgical repair:  The anatomy & physiology of the abdominal wall was discussed. The pathophysiology of hernias was discussed. Natural history risks without surgery including progeressive enlargement, pain, incarceration & strangulation was discussed. Contributors to complications such as smoking, obesity, diabetes, prior surgery, etc were discussed.  I feel the risks of no intervention will lead to serious problems that outweigh the operative risks; therefore, I recommended surgery to reduce and repair the hernia. I explained laparoscopic techniques with possible need for an open approach. I noted the probable use of mesh to patch and/or buttress the hernia repair  Risks such as bleeding, infection, abscess, need for  further treatment, heart attack, death, and other risks were discussed. I noted a good likelihood this will help address the problem. Goals of post-operative recovery were discussed as well. Possibility that this will not correct all symptoms was explained. I stressed the importance of low-impact activity, aggressive pain control, avoiding constipation, & not pushing through pain to minimize risk of post-operative chronic pain or injury. Possibility of reherniation especially with smoking, obesity, diabetes, immunosuppression, and other health conditions was discussed. We will work to minimize complications.  An educational handout further explaining the pathology & treatment options was given as well. Questions were answered. The patient expresses understanding & wishes to proceed with surgery.   OR FINDINGS:   Right greater than left bilateral indirect inguinal hernias.  Periumbilical ventral hernia incarcerated with omentum.  4 x 4 centimeters in size.  Type of repair - Laparoscopic underlay repair   Name of mesh - Bard Ventra?  Size of mesh - Length 23 cm, Width 17 cm  Mesh overlap - >7cm cm  Placement of mesh - Intraperitoneal underlay repair   DESCRIPTION:   Informed consent was confirmed. The patient underwent general anaesthesia without difficulty. The patient was positioned appropriately. VTE prevention in place. The patient's abdomen was clipped, prepped, & draped in a sterile fashion. Surgical timeout confirmed our plan.  The patient was positioned in reverse Trendelenburg. Abdominal entry was gained using optical entry technique in the left upper abdomen. Entry was clean. I induced carbon dioxide insufflation. Camera inspection revealed no injury. Extra ports were carefully placed under direct laparoscopic visualization.   I could see the hernia in the central abdomen.  I did laparoscopic lysis of adhesions to expose the entire anterior abdominal  wall.  I primarily used and focused  cold scissors.    I was able to greatly reduce a large swath of omentum off of the ventral wall hernia that was periumbilical  I made sure hemostasis was good.  I mapped out the region using a needle passer.     I could see a definite right and probable left inguinal hernias.  I therefore proceed with a preperitoneal approach.  I made a transverse incision through the inferior umbilical fold.  I made a small transverse nick through the anterior rectus fascia contralateral to the inguinal hernia side and placed a 0-vicryl stitch through the fascia.  I placed a Hasson trocar into the preperitoneal plane.  Entry was clean.  We induced carbon dioxide insufflation. Camera inspection revealed no injury.  I used a 10mm angled scope to bluntly free the peritoneum off the infraumbilical anterior abdominal wall.  I created enough of a preperitoneal pocket to place 5mm ports into the right & left mid-abdomen into this preperitoneal cavity.  I focused attention on the right side since that was the dominant hernia side.   I used blunt & focused sharp dissection to free the peritoneum off the flank and down to the pubic rim.  I freed the anteriolateral bladder wall off the anteriolateral pelvic wall, sparing midline attachments.   I located a swath of peritoneum going into a hernia fascial defect at the internal ring consistent with an indirect hernia.  I gradually freed the peritoneal hernia sac off safely and reduced it into the preperitoneal space.  I freed the peritoneum off the spermatic vessels & vas deferens.  I freed peritoneum off the retroperitoneum along the psoas muscle.  I checked & assured hemostasis.   I turned attention on the opposite side.  I did dissection in a similar, mirror-image fashion. The patient had another indirect inguinal hernia.    There were no breaches in peritoneum that required closure.  I did freeze some spermatic cord lipomas and enlarged the inguinal canal lymph nodes to get up and her  sense of the anatomy.  These were morcellated and removed  I chose 15x15 cm sheets of ultra-lightweight polypropylene mesh (Ultrapro), one for each side.  I cut a single sigmoid-shaped slit ~6cm from a corner of each mesh.  I placed the meshes into the preperitoneal space & laid them as overlapping diamonds such that at the inferior points, a 6x6 cm corner flap rested in the true anterolateral pelvis, covering the obturator & femoral foramina.   I allowed the bladder to fall back and help tuck the corners of the mesh in.  The medial corners overlapped each other across midline cephalad to the pubic rim.   This provided >2 inch coverage around the hernia.  Because the defects well covered and not particularly large, I did not need tacks to hold the mesh in place  I evacuated the preperitoneal carbon dioxide.  I repositioned the ports into the peritoneal cavity to complete the central ventral hernia repair.  To ensure that I would have at least 5 cm radial coverage outside of the hernia defect, I chose a 23x17 cm dual sided mesh.  I placed #1 Prolene stitches around its edge about every 5 cm = 14 total.  I rolled the mesh & placed into the peritoneal cavity through the ventral hernia fascial defect.  I unrolled  the mesh and positioned it appropriately.  I secured the mesh to cover up the hernia defect using a laparoscopic  suture passer to pass the tails of the Prolene through the abdominal wall & tagged them with clamps.  I started out in four corners to make sure I had the mesh centered over the hernia defect appropriately, and then proceeded to work in quadrants.  We evacuated CO2 & desufflated the abdomen.  I tied the fascial stitches down.  I reinsufflated the abdomen.  The mesh provided at least 5-10 cm circumferential coverage around the entire region of hernia defects.   I tacked the edges & central part of the mesh with SecureStrap absorbable tacks.   Hemostasis was excellent.  I closed the fascia  port site on the 10 mm port using a 0 Vicryl stitch using laparoscopic intracorporeal suture passer. I closed the ventral hernia transversely using interrupted 0 PDS stitches.  I did diagnostic laparoscopy.  I then placed On-Q catheter sheaths in the preperitoneal plane under direct laparoscopic guidance from the subxiphoid region down to the posterior iliac crests in the lower flanks. I did reinspection. Hemostasis was good. Mesh laid well. Capnoperitoneum was evacuated. Ports were removed. The patient had a large volume of redundant umbilical skin.  I trimmed this down."  Her had to be adjusted and trimmed.  I closed the periumbilical wound down for an umbilical plasty using 3-0 Vicryl deep dermal stitches and 4 Monocryl stitch.  The skin was closed with Monocryl at the port sites and Steri-Strips on the fascial stitch puncture sites. OnQ catheters were placed and the sheathes peeled away. On-Q pump was secured.   Patient is being extubated to go to the recovery room. I'm about to discuss operative findings with the patient's family.

## 2012-09-06 NOTE — Progress Notes (Signed)
Patient brought back to short stay to go home. Wife enters room and states that is was told to them pre op that patient would spend the night. RN questioned what was told to wife when Dr Michaell Cowing talked to her post op. She stated that Dr Michaell Cowing said he could go home but would probably spend the night. Paged Dr Michaell Cowing. Patient to spend the night.

## 2012-09-07 ENCOUNTER — Encounter (HOSPITAL_COMMUNITY): Payer: Self-pay | Admitting: Surgery

## 2012-09-07 MED ORDER — HYDROMORPHONE HCL PF 1 MG/ML IJ SOLN: 0.5000 mg | INTRAMUSCULAR | Status: DC | PRN

## 2012-09-07 MED ORDER — NAPROXEN 500 MG PO TABS
500.0000 mg | ORAL_TABLET | Freq: Three times a day (TID) | ORAL | Status: DC
  Administered 2012-09-07: 500 mg via ORAL
  Filled 2012-09-07 (×4): qty 1

## 2012-09-07 MED ORDER — SODIUM CHLORIDE 0.9 % IJ SOLN: 3.0000 mL | INTRAMUSCULAR | Status: DC | PRN

## 2012-09-07 MED ORDER — LACTATED RINGERS IV BOLUS (SEPSIS): 1000.0000 mL | Freq: Three times a day (TID) | INTRAVENOUS | Status: DC | PRN

## 2012-09-07 MED ORDER — OXYCODONE HCL 5 MG PO TABS
10.0000 mg | ORAL_TABLET | ORAL | Status: DC | PRN
  Administered 2012-09-07: 10 mg via ORAL
  Filled 2012-09-07: qty 2

## 2012-09-07 MED ORDER — SODIUM CHLORIDE 0.9 % IJ SOLN: 3.0000 mL | Freq: Two times a day (BID) | INTRAMUSCULAR | Status: DC

## 2012-09-07 MED ORDER — METHOCARBAMOL 750 MG PO TABS: 750.0000 mg | ORAL_TABLET | Freq: Four times a day (QID) | ORAL | Status: DC

## 2012-09-07 MED ORDER — METHOCARBAMOL 750 MG PO TABS
750.0000 mg | ORAL_TABLET | Freq: Four times a day (QID) | ORAL | Status: DC
  Administered 2012-09-07: 750 mg via ORAL
  Filled 2012-09-07 (×4): qty 1

## 2012-09-07 NOTE — Care Management Note (Signed)
    Page 1 of 1   09/07/2012     11:37:16 AM   CARE MANAGEMENT NOTE 09/07/2012  Patient:  Ryan Mathews, Ryan Mathews   Account Number:  [id number]  Date Initiated:  09/07/2012  Documentation initiated by:  Lorenda Ishihara  Subjective/Objective Assessment:   67 yo male admitted s/p lap hernia repair.     Action/Plan:   Home when stable   Anticipated DC Date:  09/07/2012   Anticipated DC Plan:  HOME/SELF CARE      DC Planning Services  CM consult      Choice offered to / List presented to:             Status of service:  Completed, signed off Medicare Important Message given?   (If response is "NO", the following Medicare IM given date fields will be blank) Date Medicare IM given:   Date Additional Medicare IM given:    Discharge Disposition:  HOME/SELF CARE  Per UR Regulation:  Reviewed for med. necessity/level of care/duration of stay  If discussed at Long Length of Stay Meetings, dates discussed:    Comments:

## 2012-09-07 NOTE — Progress Notes (Signed)
Ryan Mathews [id number] 02-29-1946  CARE TEAM:  PCP: [name], MD  Outpatient Care Team: Patient Care Team: [name], MD as PCP - General (Family Medicine) [name], MD as Consulting Physician (Pulmonary Disease)  Inpatient Treatment Team: Treatment Team: Attending Provider: Ardeth Sportsman, MD; Registered Nurse: Dina Rich, RN   Subjective:  Sore Drainage at RLQ site - stopped w pressure Stood up x1 - rough at 1st RN/CNA in room  Objective:  Vital signs:  Filed Vitals:   09/06/12 2045 09/06/12 2215 09/07/12 0230 09/07/12 0558  BP: 107/71 123/87 145/80 137/86  Pulse: 69 78 92 85  Temp: 97.6 F (36.4 C) 98.4 F (36.9 C) 98.2 F (36.8 C) 98.5 F (36.9 C)  TempSrc: Oral Oral Oral Oral  Resp: 16 16 16 16   SpO2: 95% 95% 91% 91%       Intake/Output   Yesterday:  06/26 0701 - 06/27 0700 In: 2590 [P.O.:600; I.V.:1990] Out: 370 [Urine:370] This shift:     Bowel function:  Flatus: y  BM: n  Drain: n/a  Physical Exam:  General: Pt awake/alert/oriented x4 in no acute distress Eyes: PERRL, normal EOM.  Sclera clear.  No icterus Neuro: CN II-XII intact w/o focal sensory/motor deficits. Lymph: No head/neck/groin lymphadenopathy Psych:  No delerium/psychosis/paranoia HENT: Normocephalic, Mucus membranes moist.  No thrush Neck: Supple, No tracheal deviation Chest: No chest wall pain w good excursion CV:  Pulses intact.  Regular rhythm MS: Normal AROM mjr joints.  No obvious deformity Abdomen: Soft.  Nondistended.  Mod tender at incisions only.  OnQ in place.  Old drainage - thin serosanguinous.  No evidence of peritonitis.  No incarcerated hernias. Ext:  SCDs BLE.  No mjr edema.  No cyanosis Skin: No petechiae / purpura   Problem List:   Principal Problem:   Incarcerated ventral hernia Active Problems:   Bilateral inguinal hernia (BIH) s/p lap repair 09/06/2012   Obesity (BMI 30-39.9)   Assessment  Ryan Mathews   67 y.o. male  1 Day Post-Op  Procedure(s): LAPAROSCOPIC exploration and repair of hernias in bellybutton and bilateral groins with mesh INSERTION OF MESH  Fair  Plan:  -inc pain control -VTE prophylaxis- SCDs, etc -mobilize as tolerated to help recovery -I discussed the patient's status to the patient/ family.  Questions were answered.  They expressed understanding & appreciation.   D/C patient from hospital when patient meets criteria (anticipate in 0-1 day(s)):  Tolerating oral intake well Ambulating in walkways Adequate pain control without IV medications Urinating  Having flatus   Ardeth Sportsman, M.D., F.A.C.S. Gastrointestinal and Minimally Invasive [facility] [address] [place], [place] [address] [phone] Main / Paging   09/07/2012   Results:   Labs: No results found for this or any previous visit (from the past 48 hour(s)).  Imaging / Studies: No results found.  Medications / Allergies: per chart  Antibiotics: Anti-infectives   Start     Dose/Rate Route Frequency Ordered Stop   09/06/12 0936  ceFAZolin (ANCEF) IVPB 2 g/50 mL premix     2 g 100 mL/hr over 30 Minutes Intravenous 60 min pre-op 09/06/12 0936 09/06/12 1205

## 2012-09-10 ENCOUNTER — Telehealth (INDEPENDENT_AMBULATORY_CARE_PROVIDER_SITE_OTHER): Payer: Self-pay

## 2012-09-10 NOTE — Telephone Encounter (Signed)
Message copied by Ethlyn Gallery on Mon Sep 10, 2012  8:48 AM ------      Message from: Velora Heckler      Created: Sun Sep 09, 2012 12:17 PM       Call patient Monday and check on status.  Having some drainage from right sided incision, and having swelling in feet.            Make follow up appt with Dr. Michaell Cowing.            tmg            Velora Heckler, MD, [facility]      Office: [phone]             ------

## 2012-09-10 NOTE — Telephone Encounter (Signed)
Called pt to check on him after the pt spoke to our on call doctor over the weekend. The pt is feeling better than this weekend he was concerned about swelling in both of his feet but he elevated the feet last night and the swelling has gone down a bunch. The pt was concerned with some drainage coming from one of the incisions but he put a bandage over the area. I advised the pt that I could bring him in for an appt with Dr Michaell Cowing tomorrow to be checked but the pt felt it would be too early to be seen by Dr Michaell Cowing. I advised pt that I was just offering the appt since the pt had to call the doctor on call over the weekend but the pt stated that his wife was more worried about stuff than he really is at this point. The pt states that he is fine seeing Dr Michaell Cowing in 2wks for a recheck unless if things get worse than the pt will call us back. I made him an appt for 7/9 at 11:45. The pt understands.

## 2012-09-11 NOTE — Telephone Encounter (Signed)
Some drainage from his puncture site as expected but it should calm down.  Consider Band-Aid.  If persistent edema, consider discussing with primary care physician for diuresis.  However, he should spontaneously diurese.  Sounds like this is already happening.  I am happy to see him sooner.  His wife is very pleasant but is very anxious.  Hopefully she feels better about things now

## 2012-09-12 NOTE — Discharge Summary (Signed)
Physician Discharge Summary  Patient ID: Ryan Mathews MRN: [id number] DOB/AGE: 65-25-1947 65 y.o.  Admit date: 09/06/2012 Discharge date: 09/07/2012  Admission Diagnoses:  Discharge Diagnoses:  Principal Problem:   Incarcerated ventral hernia Active Problems:   Bilateral inguinal hernia (BIH) s/p lap repair 09/06/2012   Obesity (BMI 30-39.9)   Discharged Condition: good  Hospital Course: The patient underwent lap reapir of incarcerated VWH & BIHernias.  Postoperatively, the patient mobilized and advanced to a solid diet gradually.  Pain was well-controlled and transitioned off IV medications.    By the time of discharge, the patient was walking well the hallways, eating food well, having flatus.  Pain was-controlled on an oral regimen.  Based on meeting DC criteria and recovering well, I felt it was safe for the patient to be discharged home with close followup.  Instructions were discussed in detail.  They are written as well.     Consults: None  Significant Diagnostic Studies:   Treatments:   Discharge Exam: Blood pressure 137/86, pulse 85, temperature 98.5 F (36.9 C), temperature source Oral, resp. rate 16, SpO2 91.00%.  General: Pt awake/alert/oriented x4 in no major acute distress Eyes: PERRL, normal EOM. Sclera nonicteric Neuro: CN II-XII intact w/o focal sensory/motor deficits. Lymph: No head/neck/groin lymphadenopathy Psych:  No delerium/psychosis/paranoia HENT: Normocephalic, Mucus membranes moist.  No thrush Neck: Supple, No tracheal deviation Chest: No pain.  Good respiratory excursion. CV:  Pulses intact.  Regular rhythm MS: Normal AROM mjr joints.  No obvious deformity Abdomen: Soft, Nondistended.  Min tender at incisions.  No incarcerated hernias. Ext:  SCDs BLE.  No significant edema.  No cyanosis Skin: No petechiae / purpura   Disposition: 01-Home or Self Care  Discharge Orders   Future Appointments Provider Department Dept Phone   09/19/2012  12:00 PM Ardeth Sportsman, MD [facility], [place] [phone]   10/25/2012 2:15 PM [name], MD [facility] [phone]   Future Orders Complete By Expires     Call MD for:  extreme fatigue  As directed     Call MD for:  hives  As directed     Call MD for:  persistant nausea and vomiting  As directed     Call MD for:  redness, tenderness, or signs of infection (pain, swelling, redness, odor or green/yellow discharge around incision site)  As directed     Call MD for:  severe uncontrolled pain  As directed     Call MD for:  As directed     Comments:      Temperature > 101.73F    Diet - low sodium heart healthy  As directed     Discharge instructions  As directed     Comments:      Please see discharge instruction sheets.  Also refer to handout given an office.  Please call our office if you have any questions or concerns [phone]    Discharge wound care:  As directed     Comments:      If you have closed incisions, shower and bathe over these incisions with soap and water every day.  Remove all surgical dressings on postoperative day #3.  You do not need to replace dressings over the closed incisions unless you feel more comfortable with a Band-Aid covering it.   If you have an open wound that requires packing, please see wound care instructions.  In general, remove all dressings, wash wound with soap and water and then replace  with saline moistened gauze.  Do the dressing change at least every day.  Please call our office [phone] if you have further questions.    Drain care  As directed     Comments:      Teach the patient how to manage drains / tubes if any remaining at the time of discharge.  Teach how to self-d/c OnQ pump catheters.   OnQ pain pump catheter care.  Teach pt/family removal at home when fully collapsed 4 days after placement in OR    Driving Restrictions  As directed     Comments:      No driving until off narcotics and can  safely swerve away without pain during an emergency    Increase activity slowly  As directed     Comments:      Walk an hour a day.  Use 20-30 minute walks.  When you can walk 30 minutes without difficulty, increase to low impact/moderate activities such as biking, jogging, swimming, sexual activity..  Eventually can increase to unrestricted activity when not feeling pain.  If you feel pain: STOP!Marland Kitchen   Let pain protect you from overdoing it.  Use ice/heat/over-the-counter pain medications to help minimize his soreness.  Use pain prescriptions as needed to remain active.  It is better to take extra pain medications and be more active than to stay bedridden to avoid all pain medications.    Lifting restrictions  As directed     Comments:      Avoid heavy lifting initially.  Do not push through pain.  You have no specific weight limit.  Coughing and sneezing or four more stressful to your incision than any lifting you will do. Pain will protect you from injury.  Therefore, avoid intense activity until off all narcotic pain medications.  Coughing and sneezing or four more stressful to your incision than any lifting he will do.    May shower / Bathe  As directed     May walk up steps  As directed     Sexual Activity Restrictions  As directed     Comments:      Sexual activity as tolerated.  Do not push through pain.  Pain will protect you from injury.    Walk with assistance  As directed     Comments:      Walk over an hour a day.  May use a walker/cane/companion to help with balance and stamina.        Medication List         b complex vitamins tablet  Take 1 tablet by mouth daily.     methocarbamol 750 MG tablet  Commonly known as:  ROBAXIN  Take 1 tablet (750 mg total) by mouth 4 (four) times daily.     naproxen 500 MG tablet  Commonly known as:  NAPROSYN  Take 1 tablet (500 mg total) by mouth 2 (two) times daily with a meal.     omeprazole-sodium bicarbonate 40-1100 MG per capsule   Commonly known as:  ZEGERID  Take 1 capsule by mouth daily as needed.     oxyCODONE 5 MG immediate release tablet  Commonly known as:  Oxy IR/ROXICODONE  Take 1-2 tablets (5-10 mg total) by mouth every 4 (four) hours as needed for pain.     vitamin C 500 MG tablet  Commonly known as:  ASCORBIC ACID  Take 500 mg by mouth daily.           Follow-up Information  Follow up with Aliany Fiorenza C., MD. Schedule an appointment as soon as possible for a visit in 3 weeks.   Contact information:   [address] [place] [address] [phone]       Signed: Ardeth Sportsman. 09/12/2012, 8:15 PM

## 2012-09-19 ENCOUNTER — Encounter (INDEPENDENT_AMBULATORY_CARE_PROVIDER_SITE_OTHER): Payer: Self-pay

## 2012-09-19 ENCOUNTER — Ambulatory Visit (INDEPENDENT_AMBULATORY_CARE_PROVIDER_SITE_OTHER): Payer: Medicare Other | Admitting: Surgery

## 2012-09-19 ENCOUNTER — Encounter (INDEPENDENT_AMBULATORY_CARE_PROVIDER_SITE_OTHER): Payer: Self-pay | Admitting: Surgery

## 2012-09-19 VITALS — BP 136/74 | HR 72 | Temp 97.8°F | Resp 16 | Ht 69.0 in | Wt 201.8 lb

## 2012-09-19 DIAGNOSIS — E669 Obesity, unspecified: Secondary | ICD-10-CM

## 2012-09-19 DIAGNOSIS — M6208 Separation of muscle (nontraumatic), other site: Secondary | ICD-10-CM

## 2012-09-19 DIAGNOSIS — K4020 Bilateral inguinal hernia, without obstruction or gangrene, not specified as recurrent: Secondary | ICD-10-CM

## 2012-09-19 DIAGNOSIS — K436 Other and unspecified ventral hernia with obstruction, without gangrene: Secondary | ICD-10-CM

## 2012-09-19 DIAGNOSIS — M6200 Separation of muscle (nontraumatic), unspecified site: Secondary | ICD-10-CM

## 2012-09-19 NOTE — Progress Notes (Addendum)
Subjective:     Patient ID: BANNON GIAMMARCO, male   DOB: 1945/04/20, 67 y.o.   MRN: [id number]  HPI  NACHMEN MANSEL  06/29/1945 [id number]  Patient Care Team: [name], MD as PCP - General (Family Medicine) [name], MD as Consulting Physician (Pulmonary Disease)  This patient is a 67 y.o.male who presents today for surgical evaluation  POST-OPERATIVE DIAGNOSIS: incarcerated umbilical and bilateral inguinal henias  PROCEDURE:09/06/2012  LAPAROSCOPIC ventral wall hernia repair with mesh  Lap BIH repair with mesh  INSERTION OF MESH  Umbilicoplasty  SURGEON: Surgeon(s):  Ardeth Sportsman, MD  ASSISTANT: RN   The patient comes today with his wife.  Feeling much better.  Had to stay the night secondary to discomfort and anxiety of the wife.  He did have some irritation with the Tegaderm dressings.  Those were removed.  Had a little drainage in the right lower quadrant puncture site.  I dried up.  He has weaned himself all pain medicines except for occasionally using naproxen at night for knee pain/discomfort.  He is feeling much better.  He is out walking several times a day.  Doing light/moderate yardwork.  His wife worries that he may be overdoing it.  Eating well.  Moving bowels well after some mild constipation at first.  Urinating okay.  No fevers or chills.  Hoping to get back to work soon.He is noted some mild numbness and tingling on his right anterior/anterolateral thigh.  Not particularly bothersome.  Patient Active Problem List   Diagnosis Date Noted  . Incarcerated ventral hernia 08/09/2012  . Bilateral inguinal hernia (BIH) s/p lap repair 09/06/2012 08/09/2012  . Obesity (BMI 30-39.9) 08/09/2012  . Diastasis recti 08/09/2012  . BACTERIAL PNEUMONIA 08/06/2009  . C O P D 08/05/2009    Past Medical History  Diagnosis Date  . COPD (chronic obstructive pulmonary disease)   . Numbness     RT SIDE OF CHIN  . Arthritis   . GERD (gastroesophageal reflux disease)      Past Surgical History  Procedure Laterality Date  . None    . Laparoscopic inguinal hernia with umbilical hernia Bilateral 09/06/2012    Procedure: LAPAROSCOPIC exploration and repair of hernias in bellybutton and bilateral groins with mesh;  Surgeon: Ardeth Sportsman, MD;  Location: [facility];  Service: General;  Laterality: Bilateral;  . Insertion of mesh Bilateral 09/06/2012    Procedure: INSERTION OF MESH;  Surgeon: Ardeth Sportsman, MD;  Location: [facility];  Service: General;  Laterality: Bilateral;    History   Social History  . Marital Status: Married    Spouse Name: N/A    Number of Children: N/A  . Years of Education: N/A   Occupational History  . Not on file.   Social History Main Topics  . Smoking status: Former Smoker -- 2.00 packs/day for 40 years    Types: Cigarettes    Quit date: 03/14/2006  . Smokeless tobacco: Never Used  . Alcohol Use: No  . Drug Use: No  . Sexually Active: Not on file   Other Topics Concern  . Not on file   Social History Narrative  . No narrative on file    Family History  Problem Relation Age of Onset  . Cancer Mother     breast ca  . Cancer Sister     lung ca    Current Outpatient Prescriptions  Medication Sig Dispense Refill  . b complex vitamins tablet Take 1 tablet by  mouth daily.      . methocarbamol (ROBAXIN) 750 MG tablet Take 1 tablet (750 mg total) by mouth 4 (four) times daily.  40 tablet  1  . naproxen (NAPROSYN) 500 MG tablet Take 1 tablet (500 mg total) by mouth 2 (two) times daily with a meal.  40 tablet  1  . omeprazole-sodium bicarbonate (ZEGERID) 40-1100 MG per capsule Take 1 capsule by mouth daily as needed.      Marland Kitchen oxyCODONE (OXY IR/ROXICODONE) 5 MG immediate release tablet Take 1-2 tablets (5-10 mg total) by mouth every 4 (four) hours as needed for pain.  50 tablet  0  . vitamin C (ASCORBIC ACID) 500 MG tablet Take 500 mg by mouth daily.       No current facility-administered medications for this visit.      No Known Allergies  There were no vitals taken for this visit.  No results found.   Review of Systems  Constitutional: Negative for fever, chills and diaphoresis.  HENT: Negative for sore throat, trouble swallowing and neck pain.   Eyes: Negative for photophobia and visual disturbance.  Respiratory: Negative for choking and shortness of breath.   Cardiovascular: Negative for chest pain and palpitations.  Gastrointestinal: Negative for nausea, vomiting, abdominal distention, anal bleeding and rectal pain.  Genitourinary: Negative for dysuria, urgency, difficulty urinating and testicular pain.  Musculoskeletal: Negative for myalgias, arthralgias and gait problem.  Skin: Negative for color change and rash.  Neurological: Negative for dizziness, speech difficulty, weakness and numbness.  Hematological: Negative for adenopathy.  Psychiatric/Behavioral: Negative for hallucinations, confusion and agitation.       Objective:   Physical Exam  Constitutional: He is oriented to person, place, and time. He appears well-developed and well-nourished. No distress.  HENT:  Head: Normocephalic.  Mouth/Throat: Oropharynx is clear and moist. No oropharyngeal exudate.  Eyes: Conjunctivae and EOM are normal. Pupils are equal, round, and reactive to light. No scleral icterus.  Neck: Normal range of motion. No tracheal deviation present.  Cardiovascular: Normal rate, normal heart sounds and intact distal pulses.   Pulmonary/Chest: Effort normal. No respiratory distress.  Abdominal: Soft. He exhibits no distension. There is no tenderness. Hernia confirmed negative in the right inguinal area and confirmed negative in the left inguinal area.  Incisions clean with normal healing ridges.  No hernias  Musculoskeletal: Normal range of motion. He exhibits no tenderness.  Neurological: He is alert and oriented to person, place, and time. No cranial nerve deficit. He exhibits normal muscle tone. Coordination  normal.  Skin: Skin is warm and dry. No rash noted. He is not diaphoretic.  Some desquamation and peeling of old blisters an old dressing sites.  No cellulitis.  No drainage.  Psychiatric: He has a normal mood and affect. His behavior is normal.       Assessment:     Two weeks status post laparoscopic reduction and repair of incarcerated large umbilical ventral hernia and bilateral inguinal hernias, recovering well    Plan:     Increase activity as tolerated to regular activity.  Low impact exercise such as walking an hour a day at least ideal.  Do not push through pain.  Okay to get back to work next week as long as it is light duty.  Then transition to unrestricted activity by the end of the month the  Diet as tolerated.  Low fat high fiber diet ideal.  Bowel regimen with 30 g fiber a day and fiber supplement as needed to  avoid problems.  Normally I would have someone come back in a month, but he is doing rather well.  I offered a visit but he felt comfortable with returning to clinic as needed.   Instructions discussed.  Followup with primary care physician for other health issues as would normally be done.  Questions answered.  The patient expressed understanding and appreciation

## 2012-09-19 NOTE — Patient Instructions (Addendum)
Managing Pain  Pain after surgery or related to activity is often due to strain/injury to muscle, tendon, nerves and/or incisions.  This pain is usually short-term and will improve in a few months.   Many people find it helpful to do the following things TOGETHER to help speed the process of healing and to get back to regular activity more quickly:  1. Avoid heavy physical activity a.  no lifting greater than 20 pounds b. Do not "push through" the pain.  Listen to your body and avoid positions and maneuvers than reproduce the pain c. Walking is okay as tolerated, but go slowly and stop when getting sore.  d. Remember: If it hurts to do it, then don't do it! 2. Take Anti-inflammatory medication  a. Take with food/snack around the clock for 1-2 weeks i. This helps the muscle and nerve tissues become less irritable and calm down faster b. Choose ONE of the following over-the-counter medications: i. Naproxen 220mg  tabs (ex. Aleve) 1-2 pills twice a day  ii. Ibuprofen 200mg  tabs (ex. Advil, Motrin) 3-4 pills with every meal and just before bedtime iii. Acetaminophen 500mg  tabs (Tylenol) 1-2 pills with every meal and just before bedtime 3. Use a Heating pad or Ice/Cold Pack a. 4-6 times a day b. May use warm bath/hottub  or showers 4. Try Gentle Massage and/or Stretching  a. at the area of pain many times a day b. stop if you feel pain - do not overdo it  Try these steps together to help you body heal faster and avoid making things get worse.  Doing just one of these things may not be enough.    If you are not getting better after two weeks or are noticing you are getting worse, contact our office for further advice; we may need to re-evaluate you & see what other things we can do to help.  LAPAROSCOPIC SURGERY: POST OP INSTRUCTIONS  1. DIET: Follow a light bland diet the first 24 hours after arrival home, such as soup, liquids, crackers, etc.  Be sure to include lots of fluids daily.  Avoid  fast food or heavy meals as your are more likely to get nauseated.  Eat a low fat the next few days after surgery.   2. Take your usually prescribed home medications unless otherwise directed. 3. PAIN CONTROL: a. Pain is best controlled by a usual combination of three different methods TOGETHER: i. Ice/Heat ii. Over the counter pain medication iii. Prescription pain medication b. Most patients will experience some swelling and bruising around the incisions.  Ice packs or heating pads (30-60 minutes up to 6 times a day) will help. Use ice for the first few days to help decrease swelling and bruising, then switch to heat to help relax tight/sore spots and speed recovery.  Some people prefer to use ice alone, heat alone, alternating between ice & heat.  Experiment to what works for you.  Swelling and bruising can take several weeks to resolve.   c. It is helpful to take an over-the-counter pain medication regularly for the first few weeks.  Choose one of the following that works best for you: i. Naproxen (Aleve, etc)  Two 220mg  tabs twice a day ii. Ibuprofen (Advil, etc) Three 200mg  tabs four times a day (every meal & bedtime) iii. Acetaminophen (Tylenol, etc) 500-650mg  four times a day (every meal & bedtime) d. A  prescription for pain medication (such as oxycodone, hydrocodone, etc) should be given to you upon discharge.  Take your pain medication as prescribed.  i. If you are having problems/concerns with the prescription medicine (does not control pain, nausea, vomiting, rash, itching, etc), please call us [phone] to see if we need to switch you to a different pain medicine that will work better for you and/or control your side effect better. ii. If you need a refill on your pain medication, please contact your pharmacy.  They will contact our office to request authorization. Prescriptions will not be filled after 5 pm or on week-ends. 4. Avoid getting constipated.  Between the surgery and the  pain medications, it is common to experience some constipation.  Increasing fluid intake and taking a fiber supplement (such as Metamucil, Citrucel, FiberCon, MiraLax, etc) 1-2 times a day regularly will usually help prevent this problem from occurring.  A mild laxative (prune juice, Milk of Magnesia, MiraLax, etc) should be taken according to package directions if there are no bowel movements after 48 hours.   5. Watch out for diarrhea.  If you have many loose bowel movements, simplify your diet to bland foods & liquids for a few days.  Stop any stool softeners and decrease your fiber supplement.  Switching to mild anti-diarrheal medications (Kayopectate, Pepto Bismol) can help.  If this worsens or does not improve, please call us. 6. Wash / shower every day.  You may shower over the dressings as they are waterproof.  Continue to shower over incision(s) after the dressing is off. 7. Remove your waterproof bandages 5 days after surgery.  You may leave the incision open to air.  You may replace a dressing/Band-Aid to cover the incision for comfort if you wish.  8. ACTIVITIES as tolerated:   a. You may resume regular (light) daily activities beginning the next day-such as daily self-care, walking, climbing stairs-gradually increasing activities as tolerated.  If you can walk 30 minutes without difficulty, it is safe to try more intense activity such as jogging, treadmill, bicycling, low-impact aerobics, swimming, etc. b. Save the most intensive and strenuous activity for last such as sit-ups, heavy lifting, contact sports, etc  Refrain from any heavy lifting or straining until you are off narcotics for pain control.   c. DO NOT PUSH THROUGH PAIN.  Let pain be your guide: If it hurts to do something, don't do it.  Pain is your body warning you to avoid that activity for another week until the pain goes down. d. You may drive when you are no longer taking prescription pain medication, you can comfortably wear a  seatbelt, and you can safely maneuver your car and apply brakes. e. Bonita Quin may have sexual intercourse when it is comfortable.  9. FOLLOW UP in our office a. Please call [facility] at [phone] to set up an appointment to see your surgeon in the office for a follow-up appointment approximately 2-3 weeks after your surgery. b. Make sure that you call for this appointment the day you arrive home to insure a convenient appointment time. 10. IF YOU HAVE DISABILITY OR FAMILY LEAVE FORMS, BRING THEM TO THE OFFICE FOR PROCESSING.  DO NOT GIVE THEM TO YOUR DOCTOR.   WHEN TO CALL us [phone]: 1. Poor pain control 2. Reactions / problems with new medications (rash/itching, nausea, etc)  3. Fever over 101.5 F (38.5 C) 4. Inability to urinate 5. Nausea and/or vomiting 6. Worsening swelling or bruising 7. Continued bleeding from incision. 8. Increased pain, redness, or drainage from the incision   The clinic staff is  available to answer your questions during regular business hours (8:30am-5pm).  Please don't hesitate to call and ask to speak to one of our nurses for clinical concerns.   If you have a medical emergency, go to the nearest emergency room or call 911.  A surgeon from [facility] is always on call at the [facility], [place] [address], [address], [place], [place]  [address] ? MAIN: [phone] ? TOLL FREE: [phone] ?  FAX [phone] [email or web address]

## 2012-10-25 ENCOUNTER — Encounter: Payer: Self-pay | Admitting: Family Medicine

## 2012-10-25 ENCOUNTER — Ambulatory Visit (INDEPENDENT_AMBULATORY_CARE_PROVIDER_SITE_OTHER): Payer: Medicare Other | Admitting: Family Medicine

## 2012-10-25 VITALS — BP 126/88 | HR 76 | Temp 98.3°F | Ht 69.0 in | Wt 207.5 lb

## 2012-10-25 DIAGNOSIS — Z87891 Personal history of nicotine dependence: Secondary | ICD-10-CM

## 2012-10-25 DIAGNOSIS — E669 Obesity, unspecified: Secondary | ICD-10-CM | POA: Diagnosis not present

## 2012-10-25 DIAGNOSIS — J449 Chronic obstructive pulmonary disease, unspecified: Secondary | ICD-10-CM

## 2012-10-25 DIAGNOSIS — K219 Gastro-esophageal reflux disease without esophagitis: Secondary | ICD-10-CM

## 2012-10-25 NOTE — Assessment & Plan Note (Signed)
Weight loss noted - pt trying. Wt Readings from Last 3 Encounters:  10/25/12 207 lb 8 oz (94.121 kg)  09/19/12 201 lb 12.8 oz (91.536 kg)  08/27/12 215 lb (97.523 kg)

## 2012-10-25 NOTE — Progress Notes (Signed)
Subjective:    Patient ID: Ryan Mathews, male    DOB: [birth year], 67 y.o.   MRN: [id number]  HPI CC: new pt to establish  H/o hernia repairs (navel, 2 inguinal).  Starting to get soreness of abdomen. Sees Dr. Michaell Cowing.  Surgery done 09/06/2012  COPD - has seen Dr. Vassie Loll.  Stable off meds.  GERD - on zegerid daily.  On naprosyn 50mg  bid with meals (since surgery).  GERD controlled overall with diet.  Ex -smoking - quit 5 yrs ago.  About 45 PY hx.  Denies dyspnea or cough.  Caffeine: 2 coffee, 2 tea/day Lives with wife and 4 cats and 1 dog, grown children Occupation: Radio broadcast assistant Edu: HS Activity: stays active at work and in garden Diet: some water, fruits/vegetables daily  Preventative: None recently Received medicare 03/2012 Flu shot year Tetanus ~2012 Pneumovax 2013 Shingles 2013  Medications and allergies reviewed and updated in chart.  Past histories reviewed and updated if relevant as below. Patient Active Problem List   Diagnosis Date Noted  . Incarcerated ventral hernia 08/09/2012  . Bilateral inguinal hernia (BIH) s/p lap repair 09/06/2012 08/09/2012  . Obesity (BMI 30-39.9) 08/09/2012  . Diastasis recti 08/09/2012  . BACTERIAL PNEUMONIA 08/06/2009  . C O P D 08/05/2009   Past Medical History  Diagnosis Date  . COPD (chronic obstructive pulmonary disease)   . Numbness     R cheek stays numb  . Arthritis     R CMC  . GERD (gastroesophageal reflux disease)   . Ex-smoker   . History of chicken pox    Past Surgical History  Procedure Laterality Date  . Laparoscopic inguinal hernia with umbilical hernia Bilateral 09/06/2012    Procedure: LAPAROSCOPIC exploration and repair of hernias in bellybutton and bilateral groins with mesh;  Surgeon: Ardeth Sportsman, MD  . Insertion of mesh Bilateral 09/06/2012    Procedure: INSERTION OF MESH;  Surgeon: Ardeth Sportsman, MD   History  Substance Use Topics  . Smoking status: Former Smoker -- 2.00 packs/day for 40  years    Types: Cigarettes    Start date: 03/15/1967    Quit date: 03/14/2006  . Smokeless tobacco: Never Used  . Alcohol Use: No   Family History  Problem Relation Age of Onset  . Cancer Mother     breast  . Cancer Sister     lung (minimal smoker)   No Known Allergies Current Outpatient Prescriptions on File Prior to Visit  Medication Sig Dispense Refill  . b complex vitamins tablet Take 1 tablet by mouth daily.      . naproxen (NAPROSYN) 500 MG tablet Take 1 tablet (500 mg total) by mouth 2 (two) times daily with a meal.  40 tablet  1  . omeprazole-sodium bicarbonate (ZEGERID) 40-1100 MG per capsule Take 1 capsule by mouth daily as needed.      . vitamin C (ASCORBIC ACID) 500 MG tablet Take 500 mg by mouth daily.       No current facility-administered medications on file prior to visit.     Review of Systems  Constitutional: Negative for fever, chills, activity change, appetite change, fatigue and unexpected weight change (losing weight (trying)).  HENT: Negative for hearing loss and neck pain.   Eyes: Negative for visual disturbance.  Respiratory: Negative for cough, chest tightness, shortness of breath and wheezing.   Cardiovascular: Negative for chest pain, palpitations and leg swelling.  Gastrointestinal: Positive for abdominal pain (since surgery). Negative for nausea, vomiting,  diarrhea, constipation, blood in stool and abdominal distention.  Genitourinary: Negative for hematuria and difficulty urinating.  Musculoskeletal: Negative for myalgias and arthralgias.  Skin: Negative for rash.  Neurological: Negative for dizziness, seizures, syncope and headaches.  Hematological: Negative for adenopathy. Does not bruise/bleed easily.  Psychiatric/Behavioral: Negative for dysphoric mood. The patient is not nervous/anxious.        Objective:   Physical Exam  Nursing note and vitals reviewed. Constitutional: He is oriented to person, place, and time. He appears  well-developed and well-nourished. No distress.  HENT:  Head: Normocephalic and atraumatic.  Right Ear: Hearing, tympanic membrane, external ear and ear canal normal.  Left Ear: Hearing, tympanic membrane, external ear and ear canal normal.  Nose: Nose normal.  Mouth/Throat: Oropharynx is clear and moist. No oropharyngeal exudate.  Eyes: Conjunctivae and EOM are normal. Pupils are equal, round, and reactive to light. No scleral icterus.  Neck: Normal range of motion. Neck supple. No thyromegaly present.  Cardiovascular: Normal rate, regular rhythm, normal heart sounds and intact distal pulses.   No murmur heard. Pulses:      Radial pulses are 2+ on the right side, and 2+ on the left side.  Pulmonary/Chest: Effort normal and breath sounds normal. No respiratory distress. He has no wheezes. He has no rales.  Abdominal: Soft. Bowel sounds are normal. He exhibits no distension and no mass. There is no tenderness. There is no rebound and no guarding.  Some residual postop bruising Incisions c/d/i  Musculoskeletal: Normal range of motion. He exhibits no edema.  Lymphadenopathy:    He has no cervical adenopathy.  Neurological: He is alert and oriented to person, place, and time.  CN grossly intact, station and gait intact  Skin: Skin is warm and dry. No rash noted.  Psychiatric: He has a normal mood and affect. His behavior is normal. Judgment and thought content normal.       Assessment & Plan:

## 2012-10-25 NOTE — Assessment & Plan Note (Signed)
Stable off meds.  Sees Dr. Vassie Loll.

## 2012-10-25 NOTE — Assessment & Plan Note (Signed)
Stable on zegerid daily despite daily naprosyn he takes for recent hernia repairs.

## 2012-10-25 NOTE — Patient Instructions (Signed)
Good to meet you today. Return at your convenience fasting for blood work and afterwards for welcome to medicare visit (1-2 months). Call us with questions.

## 2012-12-02 DIAGNOSIS — Z23 Encounter for immunization: Secondary | ICD-10-CM | POA: Diagnosis not present

## 2013-02-08 IMAGING — CT CT ANGIO CHEST
2 of 6 series · 19 of 46 positions shown · IV contrast (APPLIED)
Comparison: 03/19/2011 radiograph, 11/03/2009 CT

CLINICAL DATA: Left lateral pain, shortness of breath, productive
cough.

CT ANGIOGRAPHY CHEST WITH CONTRAST
TECHNIQUE: Multidetector CT imaging of the chest was performed
using the standard protocol during bolus administration of
intravenous contrast.  Multiplanar CT image reconstructions
including MIPs were obtained to evaluate the vascular anatomy.
Contrast: 100mL OMNIPAQUE IOHEXOL 300 MG/ML IV SOLN

[Series 6: pulm embolism 1.0 b25f thin · axial · 0.70mm/px · z∈[-299,-26]mm · 16 of 301 slices shown]
[im 14/301  lung]
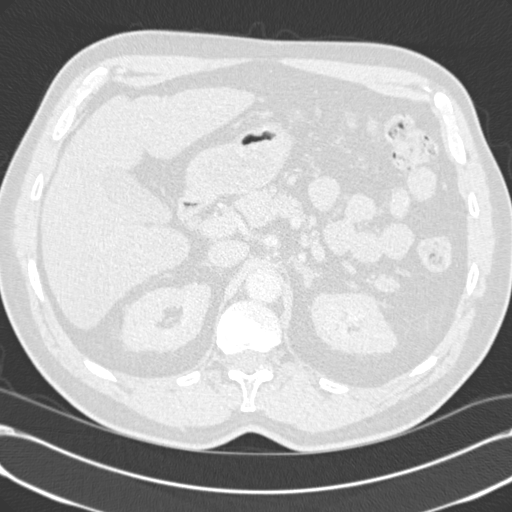
[im 40/301  soft-tissue]
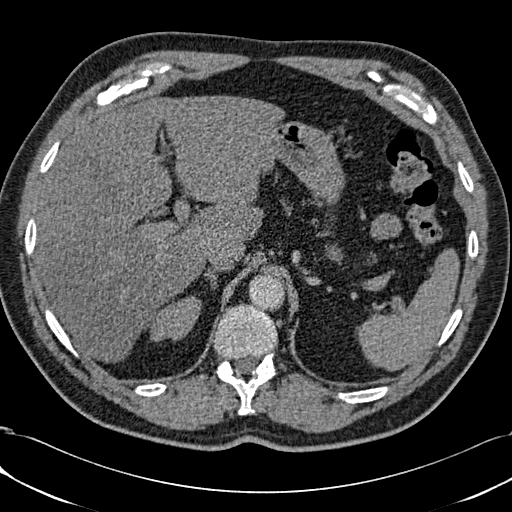
[im 53/301  lung]
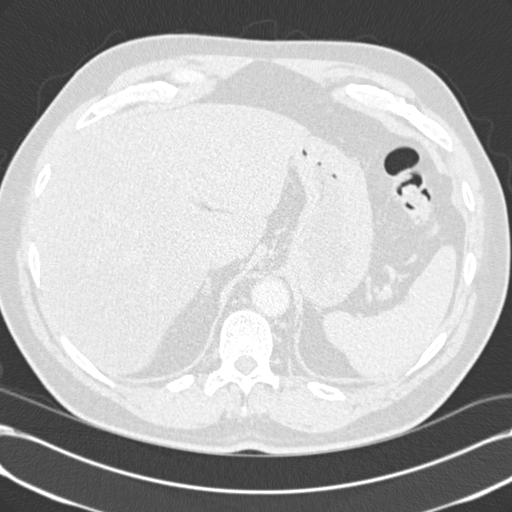
[im 66/301  soft-tissue]
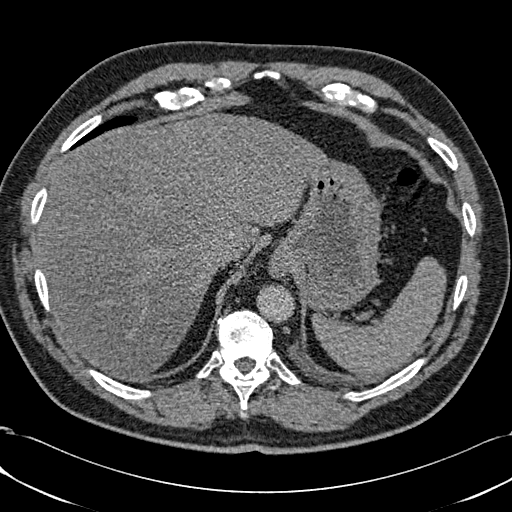
[im 92/301  lung]
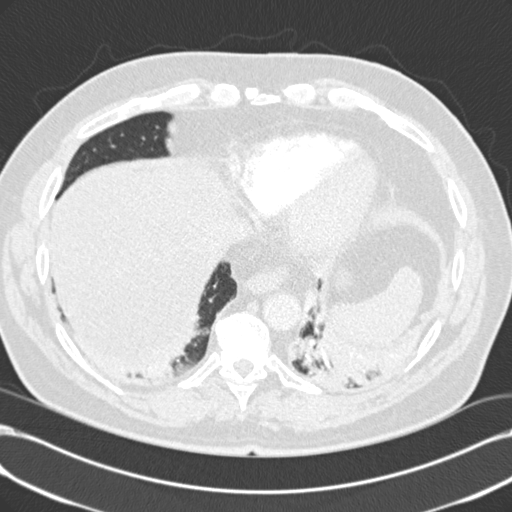
[im 105/301  soft-tissue]
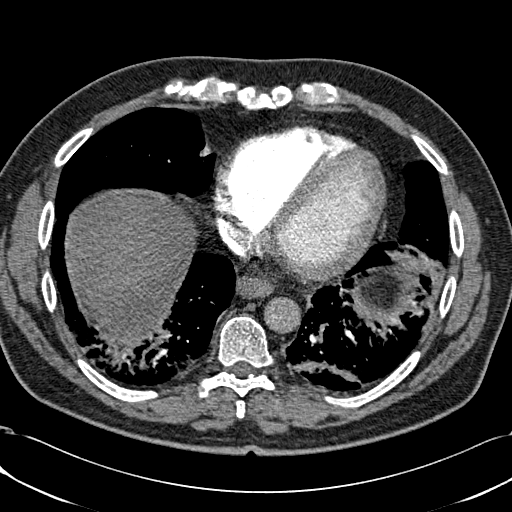
[im 118/301  lung]
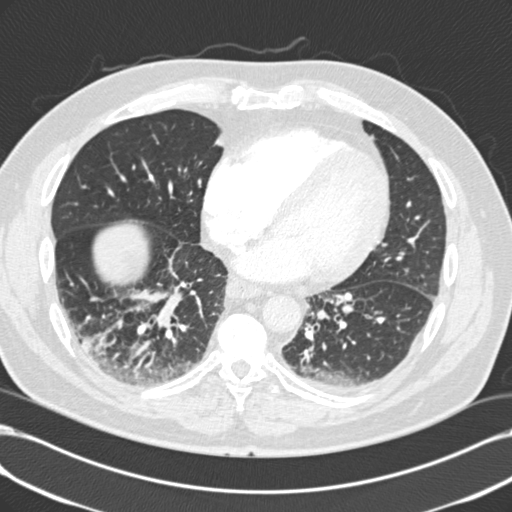
[im 144/301  soft-tissue]
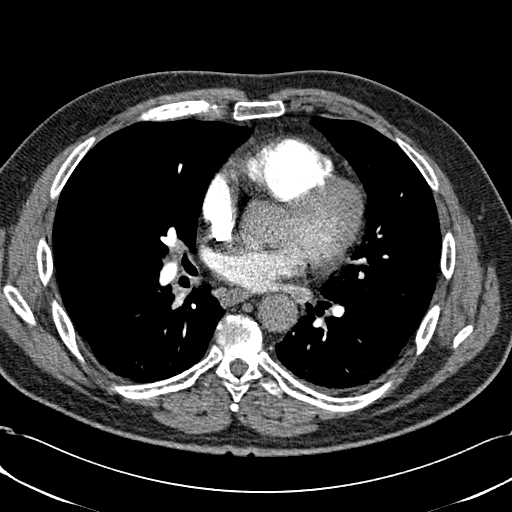
[im 157/301  lung]
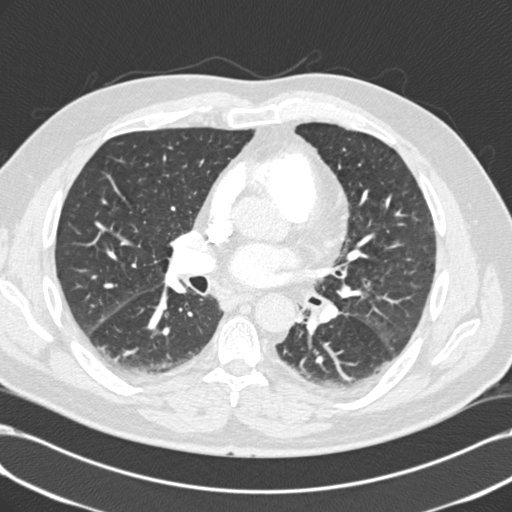
[im 183/301  soft-tissue]
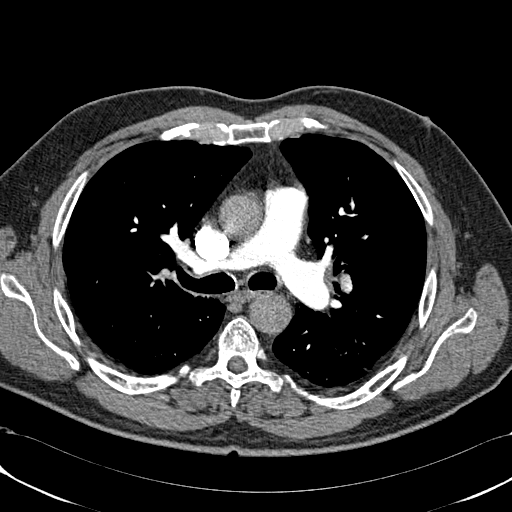
[im 196/301  lung]
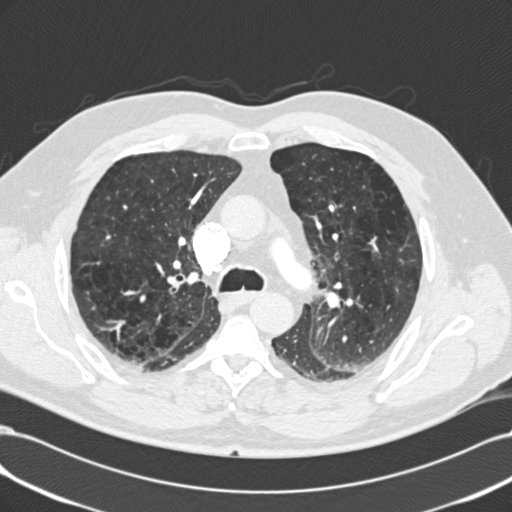
[im 209/301  soft-tissue]
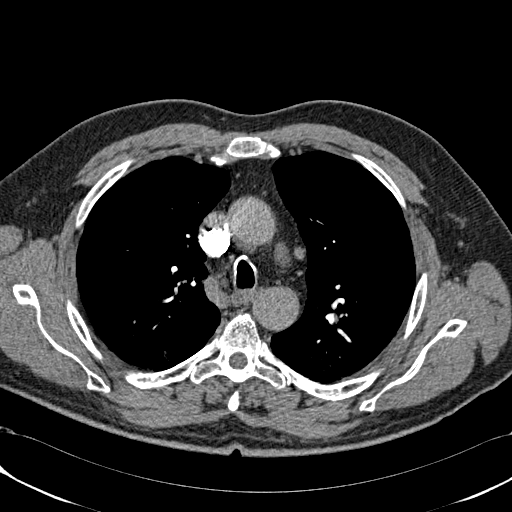
[im 235/301  lung]
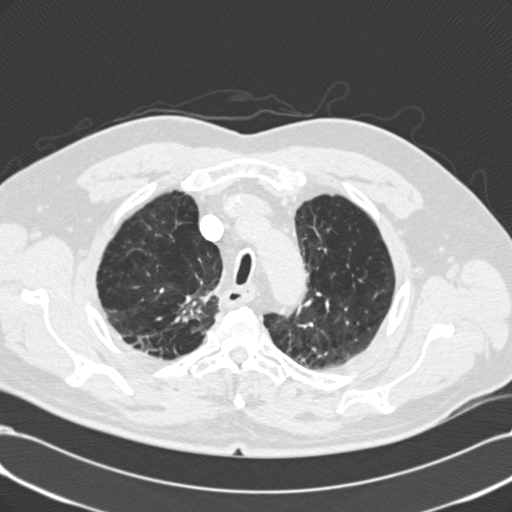
[im 248/301  soft-tissue]
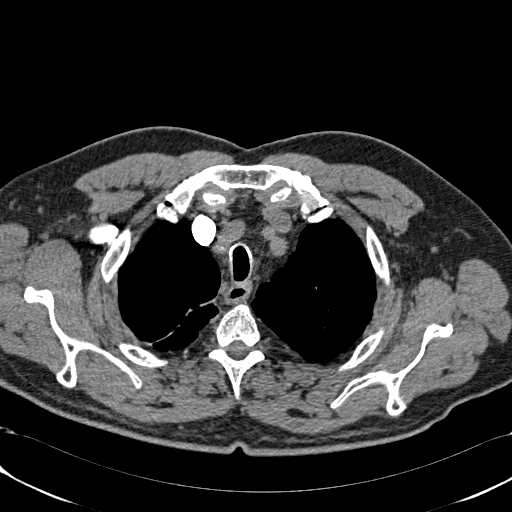
[im 261/301  lung]
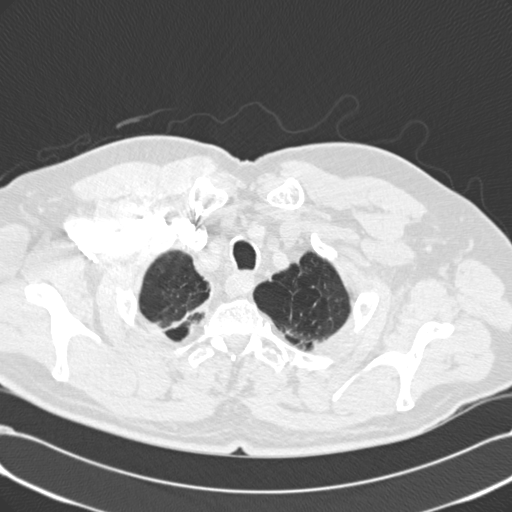
[im 287/301  soft-tissue]
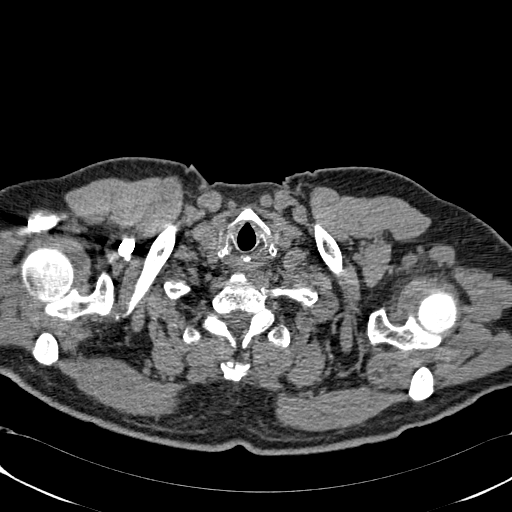

[Series 8: coronals · coronal · 0.70mm/px · 3 of 124 slices shown]
[im 31/124  soft-tissue]
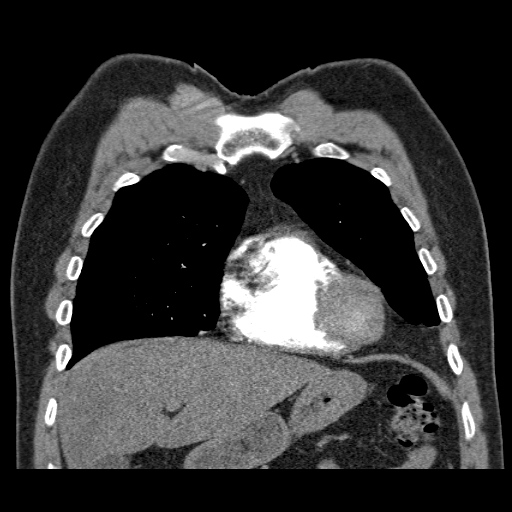
[im 62/124  soft-tissue]
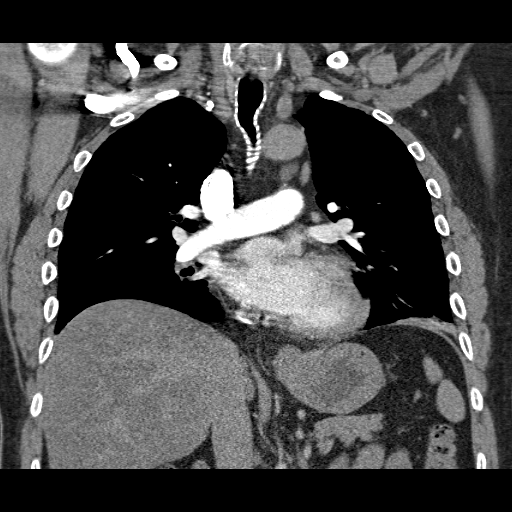
[im 93/124  soft-tissue]
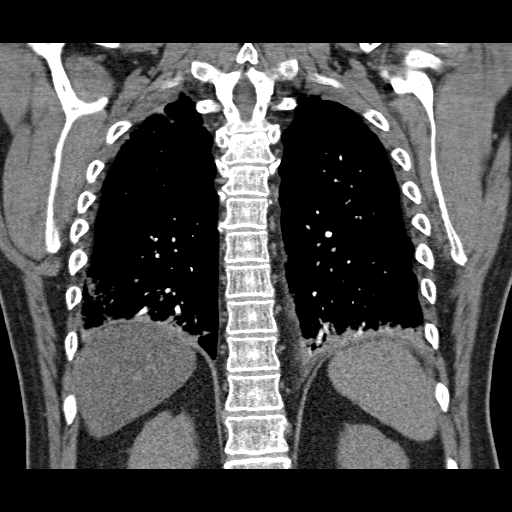

[19 of 46 positions shown; findings below may reference images not displayed]

FINDINGS: No pulmonary arterial branch filling defect.  Normal
caliber aorta.  Mild scattered atherosclerotic calcification.
Normal heart size.  No pericardial effusion. Right hilar lymph node
is upper normal limits.  Otherwise, no intrathoracic
lymphadenopathy.

Bibasilar consolidations.  Biapical bullous changes and scarring,
centrolobular emphysematous changes.  Trace pleural fluid on the
left.  Central airways are patent.  No pneumothorax.

No acute osseous abnormality.

Limited images through the upper abdomen demonstrate fatty liver.
No acute abnormality. Small hiatal hernia.

Review of the MIP images confirms the above findings.
IMPRESSION: No pulmonary embolism.

Trace left pleural effusion and bibasilar opacities; atelectasis
versus pneumonia.

Emphysematous changes.

Hepatic steatosis.

## 2013-05-14 ENCOUNTER — Telehealth: Payer: Self-pay | Admitting: Family Medicine

## 2013-05-14 NOTE — Telephone Encounter (Signed)
Patient Information:  Caller Name: Konrad Dolores  Phone: [phone]  Patient: Ryan Mathews, Ryan Mathews  Gender: Male  DOB: 1946/02/09  Age: 68 Years  PCP: Other  Office Follow Up:  Does the office need to follow up with this patient?: No  Instructions For The Office: N/A   Symptoms  Reason For Call & Symptoms: Pt has cough and congestion from a sore throat that started last week.  He has an cough and sinus drainage.  Would just like an antibiotic called in.   Reviewed Health History In EMR: Yes  Reviewed Medications In EMR: Yes  Reviewed Allergies In EMR: Yes  Reviewed Surgeries / Procedures: Yes  Date of Onset of Symptoms: 05/03/2013  Treatments Tried: Mucinex DM, Alka Seltzer Plus  Treatments Tried Worked: No  Guideline(s) Used:  Cough  Disposition Per Guideline:   See Today in Office  Reason For Disposition Reached:   Known COPD or other severe lung disease (i.e., bronchiectasis, cystic fibrosis, lung surgery) and worsening symptoms (i.e., increased sputum purulence or amount, increased breathing difficulty)  Advice Given:  N/A  Patient Will Follow Care Advice:  YES  Pt refused to go to [facility] so appt made for 05/15/13 at 1115 with Dr. Silvio Pate.  Call back instructions given.

## 2013-05-15 ENCOUNTER — Encounter: Payer: Self-pay | Admitting: Internal Medicine

## 2013-05-15 ENCOUNTER — Ambulatory Visit (INDEPENDENT_AMBULATORY_CARE_PROVIDER_SITE_OTHER): Payer: Medicare Other | Admitting: Internal Medicine

## 2013-05-15 VITALS — BP 120/80 | HR 70 | Temp 98.2°F | Wt 221.0 lb

## 2013-05-15 DIAGNOSIS — J449 Chronic obstructive pulmonary disease, unspecified: Secondary | ICD-10-CM | POA: Diagnosis not present

## 2013-05-15 DIAGNOSIS — J019 Acute sinusitis, unspecified: Secondary | ICD-10-CM | POA: Diagnosis not present

## 2013-05-15 MED ORDER — AMOXICILLIN 500 MG PO TABS: 1000.0000 mg | ORAL_TABLET | Freq: Two times a day (BID) | ORAL | Status: DC

## 2013-05-15 MED ORDER — TRAMADOL HCL 50 MG PO TABS: 50.0000 mg | ORAL_TABLET | Freq: Three times a day (TID) | ORAL | Status: DC | PRN

## 2013-05-15 NOTE — Patient Instructions (Signed)
Please call on Monday for a change in treatment if you are not considerably better.

## 2013-05-15 NOTE — Progress Notes (Signed)
Pre visit review using our clinic review tool, if applicable. No additional management support is needed unless otherwise documented below in the visit note. 

## 2013-05-15 NOTE — Assessment & Plan Note (Signed)
Not exacerbated 

## 2013-05-15 NOTE — Assessment & Plan Note (Signed)
Seems like secondary bacterial infection May bronchial inflammation also Will treat with amoxil Change to augmentin if not improved

## 2013-05-15 NOTE — Progress Notes (Signed)
   Subjective:    Patient ID: Ryan Mathews, male    DOB: [birth year], 68 y.o.   MRN: [id number]  HPI Got sore throat about 2 weeks ago Went into his head and now his lungs mucinex and alka seltzer plus not helping Bad cough---but can't get it to break up  Persistent cough--some phlegm No chest pain No SOB No wheezing  No ear pain Gets headache with the hard cough-- frontal around to back of head Cough is keeping him up Lots of post nasal drip  Current Outpatient Prescriptions on File Prior to Visit  Medication Sig Dispense Refill  . b complex vitamins tablet Take 1 tablet by mouth daily.      Marland Kitchen omeprazole-sodium bicarbonate (ZEGERID) 40-1100 MG per capsule Take 1 capsule by mouth daily as needed.      . vitamin C (ASCORBIC ACID) 500 MG tablet Take 500 mg by mouth daily.       No current facility-administered medications on file prior to visit.    No Known Allergies  Past Medical History  Diagnosis Date  . COPD (chronic obstructive pulmonary disease)   . Numbness     R cheek stays numb  . Arthritis     R CMC  . GERD (gastroesophageal reflux disease)   . Ex-smoker   . History of chicken pox     Past Surgical History  Procedure Laterality Date  . Laparoscopic inguinal hernia with umbilical hernia Bilateral 09/06/2012    Procedure: LAPAROSCOPIC exploration and repair of hernias in bellybutton and bilateral groins with mesh;  Surgeon: Adin Hector, MD  . Insertion of mesh Bilateral 09/06/2012    Procedure: INSERTION OF MESH;  Surgeon: Adin Hector, MD    Family History  Problem Relation Age of Onset  . Cancer Mother     breast  . Cancer Sister     lung (minimal smoker)    History   Social History  . Marital Status: Married    Spouse Name: N/A    Number of Children: N/A  . Years of Education: N/A   Occupational History  . Not on file.   Social History Main Topics  . Smoking status: Former Smoker -- 2.00 packs/day for 40 years    Types:  Cigarettes    Start date: 03/15/1967    Quit date: 03/14/2006  . Smokeless tobacco: Never Used  . Alcohol Use: No  . Drug Use: No  . Sexual Activity: Not on file   Other Topics Concern  . Not on file   Social History Narrative   Caffeine: 2 coffee, 2 tea/day   Lives with wife and 4 cats and 1 dog, grown children   Occupation: Solicitor   Edu: HS   Activity: stays active at work and in garden   Diet: some water, fruits/vegetables daily   Review of Systems No rash No nausea or vomiting  Appetite is okay    Objective:   Physical Exam  Constitutional: He appears well-developed and well-nourished. No distress.  HENT:  Mouth/Throat: Oropharynx is clear and moist. No oropharyngeal exudate.  No sinus tenderness Moderate nasal inflammation with some yellow mucus TMs normal  Neck: Normal range of motion. Neck supple.  Pulmonary/Chest: Effort normal and breath sounds normal. No respiratory distress. He has no wheezes. He has no rales.  Lymphadenopathy:    He has no cervical adenopathy.          Assessment & Plan:

## 2013-05-15 NOTE — Addendum Note (Signed)
Addended by: Despina Hidden on: 05/15/2013 12:13 PM   Modules accepted: Orders

## 2013-05-16 ENCOUNTER — Telehealth: Payer: Self-pay | Admitting: Family Medicine

## 2013-05-16 NOTE — Telephone Encounter (Signed)
Relevant patient education mailed to patient.  

## 2013-05-24 ENCOUNTER — Telehealth: Payer: Self-pay | Admitting: Family Medicine

## 2013-05-24 MED ORDER — AMOXICILLIN-POT CLAVULANATE 875-125 MG PO TABS: 1.0000 | ORAL_TABLET | Freq: Two times a day (BID) | ORAL | Status: DC

## 2013-05-24 NOTE — Telephone Encounter (Signed)
Pt was seen for an acute visit by Dr. Silvio Pate and pt states that he is not 100% better from his sinus infection.  His cough is better but he is still having congestion in his head a lot.  He only has 2 days left of antibiotics.  Pt states he is only calling because Dr. Silvio Pate asked him to call to say how the antibiotics were working.

## 2013-05-24 NOTE — Telephone Encounter (Signed)
Patient notified and verbalized understanding. 

## 2013-05-24 NOTE — Telephone Encounter (Signed)
plz notify I've sent in augmentin 10d course Update if sxs persist or fail to improve. Stop amox. Take with food.   Sent to [facility]

## 2013-11-07 DIAGNOSIS — H5200 Hypermetropia, unspecified eye: Secondary | ICD-10-CM | POA: Diagnosis not present

## 2013-11-07 DIAGNOSIS — H524 Presbyopia: Secondary | ICD-10-CM | POA: Diagnosis not present

## 2013-11-07 DIAGNOSIS — H2510 Age-related nuclear cataract, unspecified eye: Secondary | ICD-10-CM | POA: Diagnosis not present

## 2013-12-26 DIAGNOSIS — Z23 Encounter for immunization: Secondary | ICD-10-CM | POA: Diagnosis not present

## 2014-06-25 IMAGING — CR DG CHEST 2V
2 series · 2 of 2 positions shown · non-contrast
Comparison: 03/28/2011

CLINICAL DATA: COPD

CHEST - 2 VIEW

[view not recorded (1 of 2)]
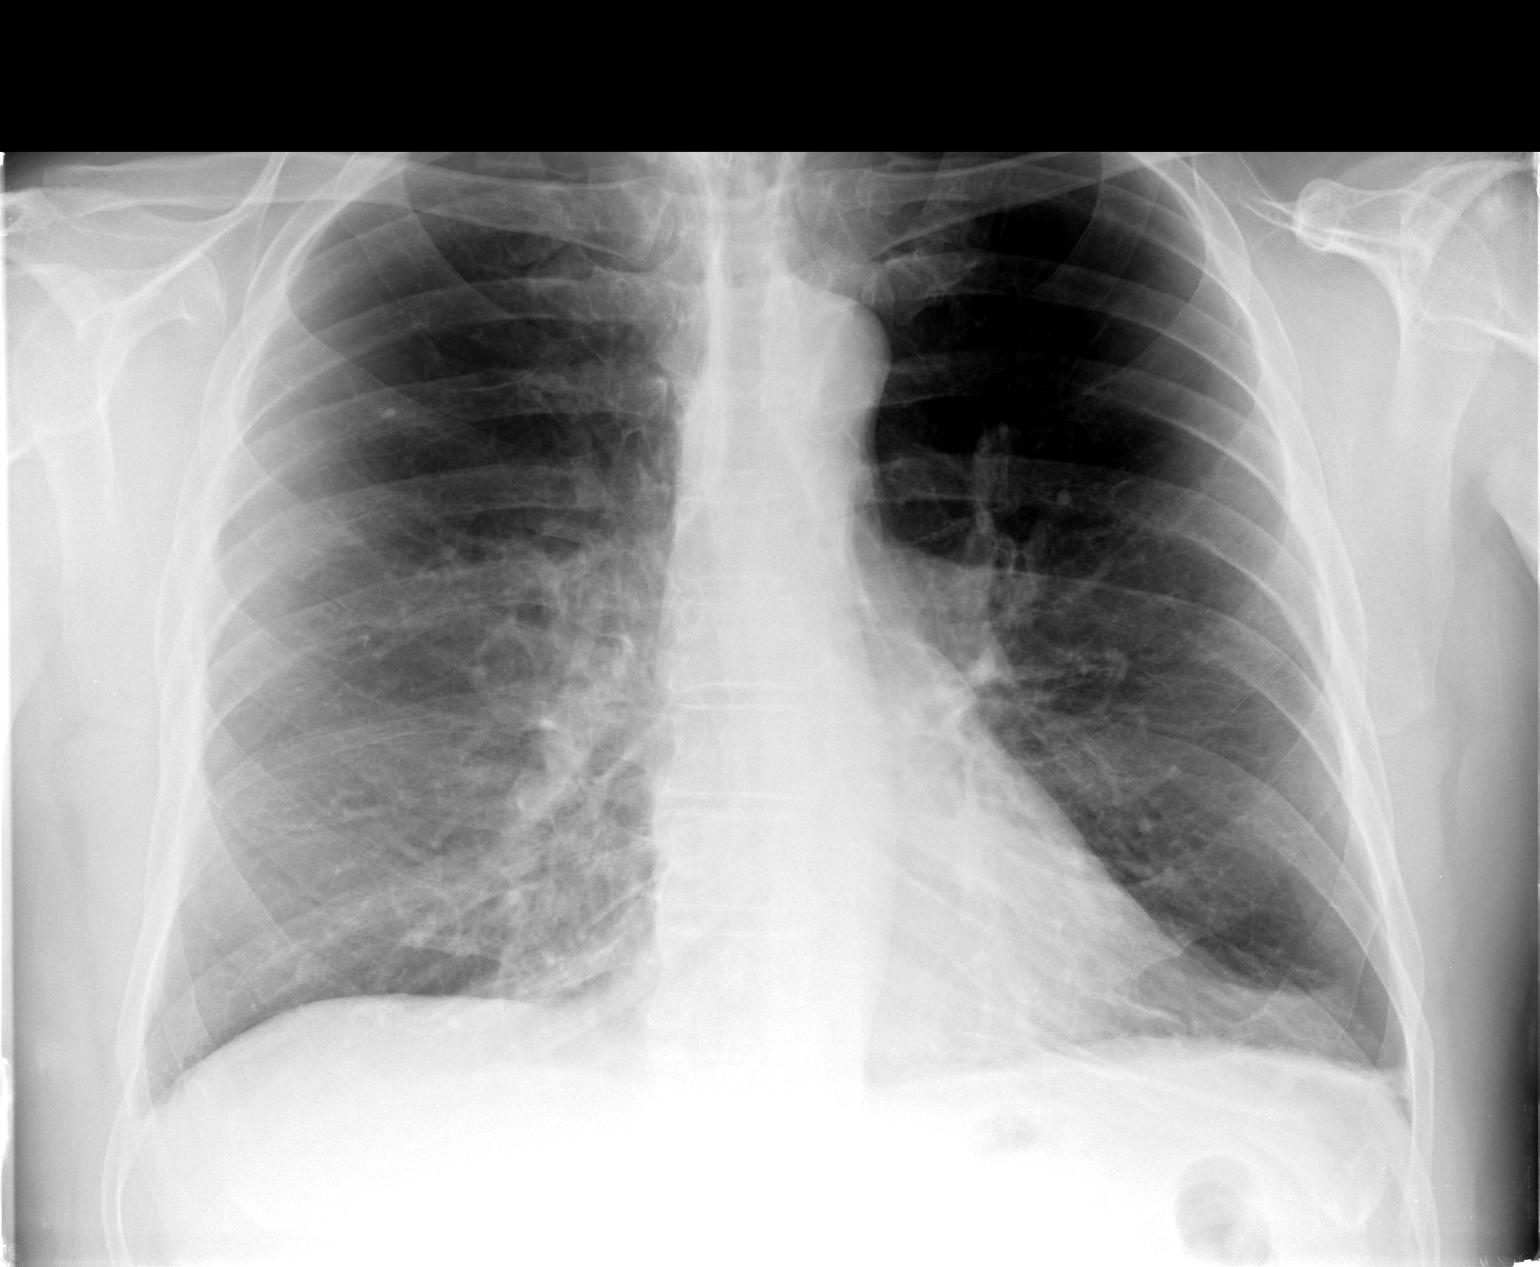

[view not recorded (2 of 2)]
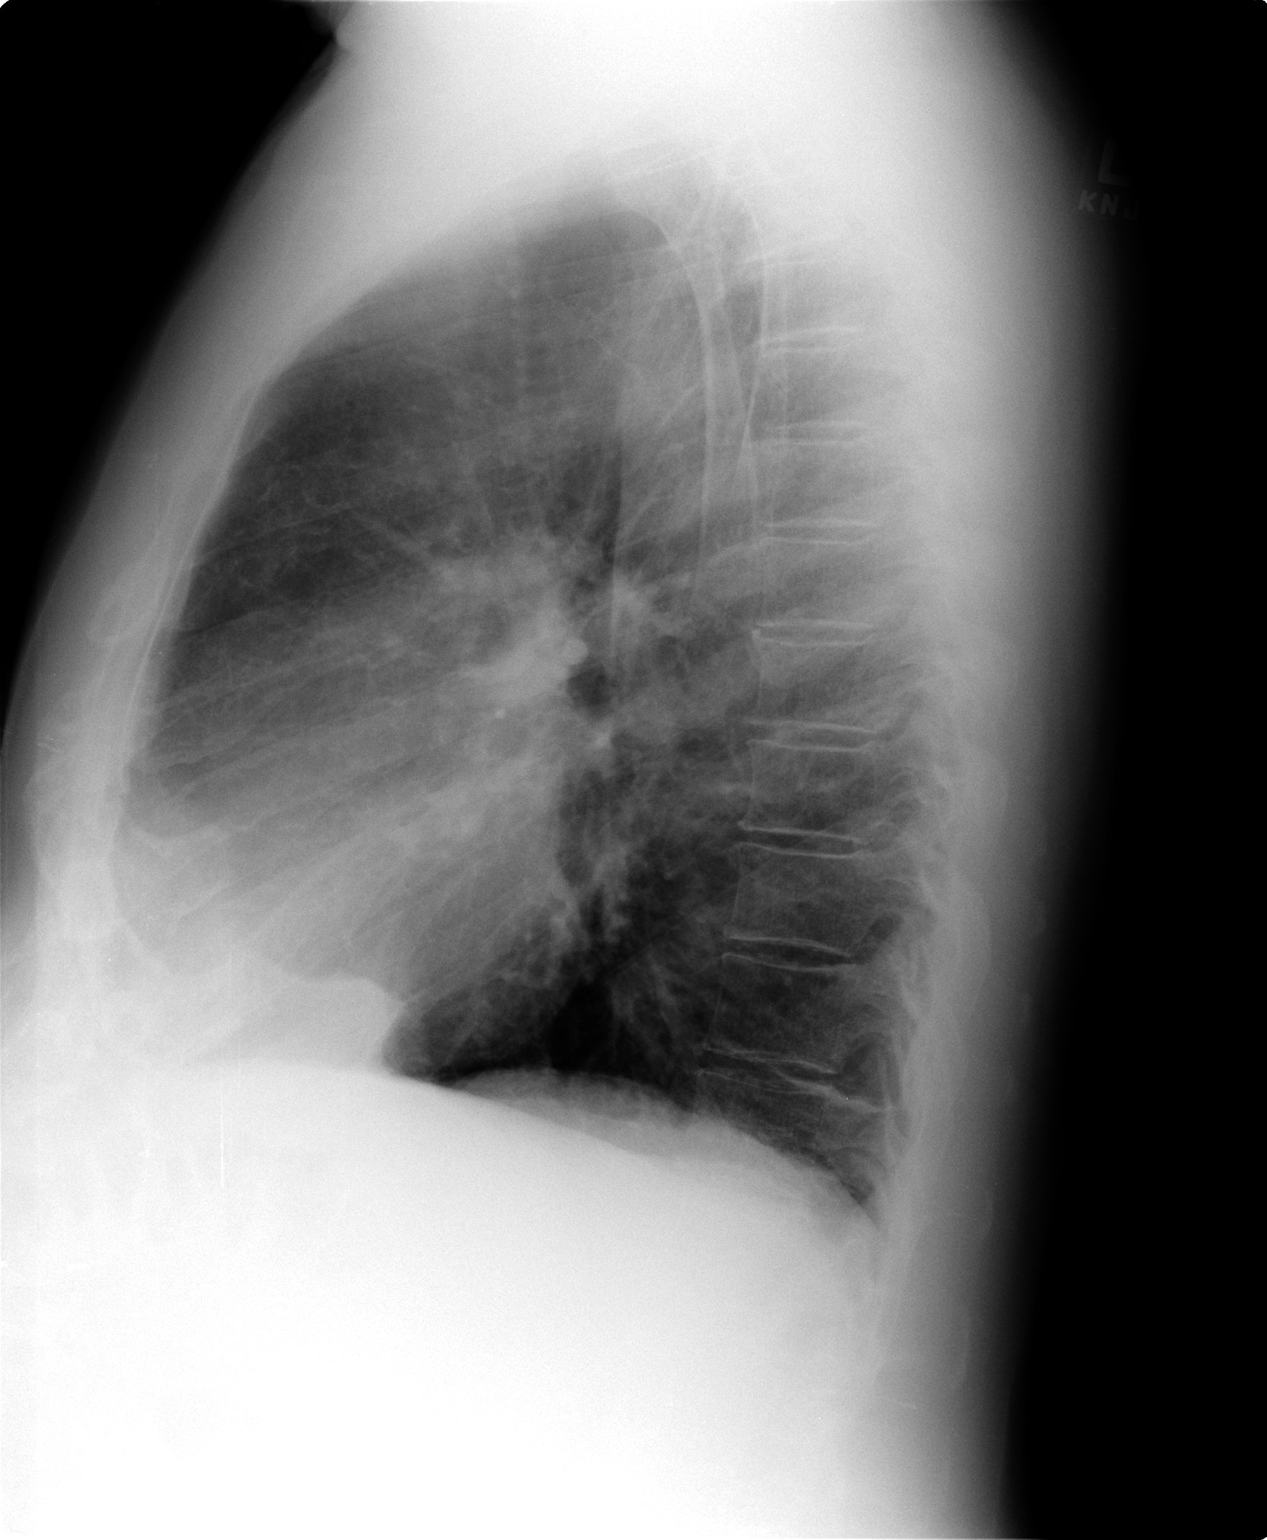

[2 of 2 positions shown; findings below may reference images not displayed]

FINDINGS: Chronic interstitial markings with bibasilar scarring.
No focal consolidation.  No pleural effusion or pneumothorax.

The heart is normal in size.

Mild degenerative changes of the visualized thoracolumbar spine.
IMPRESSION: No evidence of acute cardiopulmonary disease.

## 2014-12-21 DIAGNOSIS — Z23 Encounter for immunization: Secondary | ICD-10-CM | POA: Diagnosis not present

## 2015-08-07 ENCOUNTER — Ambulatory Visit (INDEPENDENT_AMBULATORY_CARE_PROVIDER_SITE_OTHER): Payer: Medicare Other | Admitting: Family Medicine

## 2015-08-07 ENCOUNTER — Ambulatory Visit (INDEPENDENT_AMBULATORY_CARE_PROVIDER_SITE_OTHER)
Admission: RE | Admit: 2015-08-07 | Discharge: 2015-08-07 | Disposition: A | Discharge status: Home / Self Care | Payer: Medicare Other | Source: Ambulatory Visit | Attending: Family Medicine | Admitting: Family Medicine

## 2015-08-07 ENCOUNTER — Encounter: Payer: Self-pay | Admitting: Family Medicine

## 2015-08-07 VITALS — BP 144/82 | HR 64 | Temp 98.3°F | Wt 224.0 lb

## 2015-08-07 DIAGNOSIS — M7989 Other specified soft tissue disorders: Secondary | ICD-10-CM | POA: Diagnosis not present

## 2015-08-07 DIAGNOSIS — M25472 Effusion, left ankle: Secondary | ICD-10-CM | POA: Diagnosis not present

## 2015-08-07 DIAGNOSIS — G8929 Other chronic pain: Secondary | ICD-10-CM | POA: Insufficient documentation

## 2015-08-07 DIAGNOSIS — H918X2 Other specified hearing loss, left ear: Secondary | ICD-10-CM

## 2015-08-07 DIAGNOSIS — M79606 Pain in leg, unspecified: Secondary | ICD-10-CM

## 2015-08-07 DIAGNOSIS — H6122 Impacted cerumen, left ear: Secondary | ICD-10-CM | POA: Insufficient documentation

## 2015-08-07 NOTE — Assessment & Plan Note (Addendum)
New onset left swelling and pain after possible mild injury 2-3 wks ago in setting of remote tendon injury s/p repair with metal sutures.  Xray today showing sutures higher than would be expected as well as ?possible suture rupture. Concern for tendon re injury however pt maintains dorsiflexion and adequate strength. Discussed options - offered MRI and ortho referral, given noted improvement over the last week he prefers watchful waiting with continued aleve (max 440mg  bid) and voltaren (he has this at home). Discussed red flags to seek urgent care, otherwise update me if not improving for further evaluation.

## 2015-08-07 NOTE — Progress Notes (Signed)
Pre visit review using our clinic review tool, if applicable. No additional management support is needed unless otherwise documented below in the visit note. 

## 2015-08-07 NOTE — Progress Notes (Signed)
BP 144/82 mmHg  Pulse 64  Temp(Src) 98.3 F (36.8 C) (Oral)  Wt 224 lb (101.606 kg)   CC: foot swelling, muffled hearing  Subjective:    Patient ID: Ryan Mathews, male    DOB: 1945/07/31, 70 y.o.   MRN: [id number]  HPI: Ryan Mathews is a 70 y.o. male presenting on 08/07/2015 for Foot Swelling and Hearing Problem   Last seen by me 10/2012.  L foot pain and swelling present for last 2 weeks. Feels tender/tight with dorsiflexion, marked swelling medial ankle. Denies inciting trauma/injury. He did take a wrong step with small slip 3 wks ago - no pain with this. No redness or warmth or paresthesia. Noticing L foot drop. Has treated with aleve 660mg  bid.   He has h/o L ankle tendon repair for traumatic injury remotely - repaired with "stainless steel wire" by an ER physician 1966.   No h/o gout.   L muffled hearing without pain for last few weeks - told has wax. Using peroxide drops into ear without improvement. No pain, fever, discharge, drainage.  Relevant past medical, surgical, family and social history reviewed and updated as indicated. Interim medical history since our last visit reviewed. Allergies and medications reviewed and updated. Current Outpatient Prescriptions on File Prior to Visit  Medication Sig  . omeprazole-sodium bicarbonate (ZEGERID) 40-1100 MG per capsule Take 1 capsule by mouth daily as needed.  . vitamin C (ASCORBIC ACID) 500 MG tablet Take 500 mg by mouth daily.   No current facility-administered medications on file prior to visit.    Review of Systems Per HPI unless specifically indicated in ROS section     Objective:    BP 144/82 mmHg  Pulse 64  Temp(Src) 98.3 F (36.8 C) (Oral)  Wt 224 lb (101.606 kg)  Wt Readings from Last 3 Encounters:  08/07/15 224 lb (101.606 kg)  05/15/13 221 lb (100.245 kg)  10/25/12 207 lb 8 oz (94.121 kg)    Physical Exam  Constitutional: He is oriented to person, place, and time. He appears well-developed  and well-nourished. No distress.  HENT:  Head: Normocephalic and atraumatic.  Right Ear: Hearing, tympanic membrane, external ear and ear canal normal.  Left Ear: External ear normal.  L cerumen impaction s/p irrigation. Afterwards with some maceration and residual wax but able to visualize TM. Small irritated area posterior canal  Musculoskeletal: He exhibits edema (L>R pedal edema L 2+, R1+).  Difficulty with dorsiflexion walking on left heel  Tender to palpation L medial ankle just proximal to malleolus with marked swelling but not significant erythema/warmth 1+ DP bilaterally  Neurological: He is alert and oriented to person, place, and time. He has normal strength. No sensory deficit.  5/5 strength plantar and dorsiflexion, as well as inversion/eversion at L ankle  Skin: Skin is warm and dry. No rash noted. No erythema.  Psychiatric: He has a normal mood and affect.  Nursing note and vitals reviewed.     Assessment & Plan:   Problem List Items Addressed This Visit    Ankle swelling - Primary    New onset left swelling and pain after possible mild injury 2-3 wks ago in setting of remote tendon injury s/p repair with metal sutures.  Xray today showing sutures higher than would be expected as well as ?possible suture rupture. Concern for tendon re injury however pt maintains dorsiflexion and adequate strength. Discussed options - offered MRI and ortho referral, given noted improvement over the last week he prefers  watchful waiting with continued aleve (max 440mg  bid) and voltaren (he has this at home). Discussed red flags to seek urgent care, otherwise update me if not improving for further evaluation.       Relevant Orders   DG Ankle Complete Left   Hearing loss of left ear due to cerumen impaction    S/p successful irrigation performed today.           Follow up plan: No Follow-up on file.  Ria Bush, MD

## 2015-08-07 NOTE — Assessment & Plan Note (Signed)
S/p successful irrigation performed today.

## 2015-08-07 NOTE — Patient Instructions (Addendum)
Left ear irrigation today. Xray of right ankle today. Possible re-injury of left ankle tendon. Treat with aleve 440mg  twice daily max, voltaren gel, rest, elevate, warm water soaks.  If fever, spreading redness, let us know right away or seek care. If not improving with this or any worsening, let me know for orthopedic referral.

## 2015-09-17 ENCOUNTER — Ambulatory Visit (INDEPENDENT_AMBULATORY_CARE_PROVIDER_SITE_OTHER): Payer: Medicare Other | Admitting: Family Medicine

## 2015-09-17 ENCOUNTER — Encounter: Payer: Self-pay | Admitting: Family Medicine

## 2015-09-17 VITALS — BP 118/82 | HR 68 | Temp 98.2°F | Wt 228.5 lb

## 2015-09-17 DIAGNOSIS — M25431 Effusion, right wrist: Secondary | ICD-10-CM

## 2015-09-17 DIAGNOSIS — M25472 Effusion, left ankle: Secondary | ICD-10-CM

## 2015-09-17 DIAGNOSIS — M189 Osteoarthritis of first carpometacarpal joint, unspecified: Secondary | ICD-10-CM | POA: Insufficient documentation

## 2015-09-17 LAB — CBC WITH DIFFERENTIAL/PLATELET
BASOS ABS: 0 10*3/uL (ref 0.0–0.1)
BASOS PCT: 0.5 % (ref 0.0–3.0)
EOS ABS: 0.3 10*3/uL (ref 0.0–0.7)
Eosinophils Relative: 3.1 % (ref 0.0–5.0)
HCT: 45.6 % (ref 39.0–52.0)
Hemoglobin: 15.5 g/dL (ref 13.0–17.0)
LYMPHS ABS: 2.5 10*3/uL (ref 0.7–4.0)
Lymphocytes Relative: 28.9 % (ref 12.0–46.0)
MCHC: 33.9 g/dL (ref 30.0–36.0)
MCV: 94.9 fl (ref 78.0–100.0)
MONO ABS: 0.7 10*3/uL (ref 0.1–1.0)
Monocytes Relative: 8.5 % (ref 3.0–12.0)
NEUTROS ABS: 5 10*3/uL (ref 1.4–7.7)
NEUTROS PCT: 59 % (ref 43.0–77.0)
PLATELETS: 226 10*3/uL (ref 150.0–400.0)
RBC: 4.8 Mil/uL (ref 4.22–5.81)
RDW: 13.4 % (ref 11.5–15.5)
WBC: 8.5 10*3/uL (ref 4.0–10.5)

## 2015-09-17 LAB — BASIC METABOLIC PANEL
BUN: 18 mg/dL (ref 6–23)
CHLORIDE: 106 meq/L (ref 96–112)
CO2: 27 mEq/L (ref 19–32)
Calcium: 9.5 mg/dL (ref 8.4–10.5)
Creatinine, Ser: 1.01 mg/dL (ref 0.40–1.50)
GFR: 77.96 mL/min (ref >60.00)
Glucose, Bld: 85 mg/dL (ref 70–99)
POTASSIUM: 3.8 meq/L (ref 3.5–5.1)
SODIUM: 139 meq/L (ref 135–145)

## 2015-09-17 LAB — SEDIMENTATION RATE: SED RATE: 19 mm/h (ref 0–20)

## 2015-09-17 LAB — URIC ACID: Uric Acid, Serum: 5 mg/dL (ref 4.0–7.8)

## 2015-09-17 MED ORDER — PREDNISONE 20 MG PO TABS: ORAL_TABLET | ORAL | Status: DC

## 2015-09-17 NOTE — Patient Instructions (Addendum)
I am suspicious for gout flare or inflammatory arthritis. Treat with prednisone taper and let me know if pain/swelling returns. Labs today to check for gout and inflammation/infection.

## 2015-09-17 NOTE — Progress Notes (Signed)
   BP 118/82 mmHg  Pulse 68  Temp(Src) 98.2 F (36.8 C) (Oral)  Wt 228 lb 8 oz (103.647 kg)   CC: R hand swelling  Subjective:    Patient ID: Ryan Mathews, male    DOB: 10-17-1945, 70 y.o.   MRN: [id number]  HPI: Ryan Mathews is a 70 y.o. male presenting on 09/17/2015 for Hand swelling   Seen last month with L ankle swelling - concern for acute on chronic injury of prior remote tendon injury with suture repair. This was treated with aleve and voltaren gel. He stopped aleve because of increasing swelling  Presents with 2-3d h/o R wrist pain/swelling at [facility] that started after potting 15 plants on Sunday. Self treating with ibuprofen '600mg'$  Q4 hours which has helped as well. Has also been treating with ice/heat.   H/o intermittent bilateral CMC osteoarthritis.  No h/o gout. No recent increase in shellfish, shrimp, organ meats, beer. No fevers/chills, other joint involvement.    Relevant past medical, surgical, family and social history reviewed and updated as indicated. Interim medical history since our last visit reviewed. Allergies and medications reviewed and updated. Current Outpatient Prescriptions on File Prior to Visit  Medication Sig  . omeprazole-sodium bicarbonate (ZEGERID) 40-1100 MG per capsule Take 1 capsule by mouth daily as needed.  . vitamin C (ASCORBIC ACID) 500 MG tablet Take 500 mg by mouth daily.   No current facility-administered medications on file prior to visit.    Review of Systems Per HPI unless specifically indicated in ROS section     Objective:    BP 118/82 mmHg  Pulse 68  Temp(Src) 98.2 F (36.8 C) (Oral)  Wt 228 lb 8 oz (103.647 kg)  Wt Readings from Last 3 Encounters:  09/17/15 228 lb 8 oz (103.647 kg)  08/07/15 224 lb (101.606 kg)  05/15/13 221 lb (100.245 kg)   Body mass index is 33.73 kg/(m^2).  Physical Exam  Constitutional: He appears well-developed and well-nourished. No distress.  Musculoskeletal: He exhibits edema.  L  wrist WNL R lateral wrist swelling present as well as tenderness to palpation along CMC. No digit swelling otherwise.  Neg finkelstein test.  2 +radial pulses bilaterally R ankle WNL, tr pedal edema. L ankle tender to palpation medial ankle just proximal to malleolus with marked swelling but not significant erythema/warmth, 1+ pedal edema, no ligament laxity, no pain at base of 5th MT, at navicular, or malleoli. Neg calcaneal squeeze test.   Skin: Skin is warm and dry. No rash noted.  Psychiatric: He has a normal mood and affect.  Nursing note and vitals reviewed.     Assessment & Plan:   Problem List Items Addressed This Visit    Ankle swelling    Persistent. Check labs for gout. Treat with prednisone taper. Consider MRI if not improving with treatment given remote repair with metal sutures.      Wrist swelling - Primary    Known CMC osteoarthritis bilaterally, ?flare vs gout or other inflammatory arthritis. Check labs today - including ESR, urate, CBC. Treat with prednisone taper. Pt agrees with plan.      Relevant Orders   Uric acid   CBC with Differential/Platelet   Basic metabolic panel   Sedimentation rate       Follow up plan: Return if symptoms worsen or fail to improve.  Ria Bush, MD

## 2015-09-17 NOTE — Progress Notes (Signed)
Pre visit review using our clinic review tool, if applicable. No additional management support is needed unless otherwise documented below in the visit note. 

## 2015-09-17 NOTE — Assessment & Plan Note (Signed)
Known CMC osteoarthritis bilaterally, ?flare vs gout or other inflammatory arthritis. Check labs today - including ESR, urate, CBC. Treat with prednisone taper. Pt agrees with plan.

## 2015-09-17 NOTE — Assessment & Plan Note (Signed)
Persistent. Check labs for gout. Treat with prednisone taper. Consider MRI if not improving with treatment given remote repair with metal sutures.

## 2015-09-18 ENCOUNTER — Telehealth: Payer: Self-pay | Admitting: Family Medicine

## 2015-09-18 NOTE — Telephone Encounter (Signed)
Ok to eat seafood tonight.

## 2015-09-18 NOTE — Telephone Encounter (Signed)
Patients wife notified

## 2015-09-18 NOTE — Telephone Encounter (Signed)
Pt called stating he was in this week and you gave him a rx prednisone His wife wanted to go get seafood tonight.  He wanted to know if it was ok for him to eat this

## 2015-10-14 ENCOUNTER — Other Ambulatory Visit: Payer: Self-pay | Admitting: Family Medicine

## 2015-10-14 DIAGNOSIS — E669 Obesity, unspecified: Secondary | ICD-10-CM

## 2015-10-14 DIAGNOSIS — Z13220 Encounter for screening for lipoid disorders: Secondary | ICD-10-CM

## 2015-10-14 DIAGNOSIS — M25431 Effusion, right wrist: Secondary | ICD-10-CM

## 2015-10-14 DIAGNOSIS — Z1159 Encounter for screening for other viral diseases: Secondary | ICD-10-CM

## 2015-10-14 DIAGNOSIS — Z125 Encounter for screening for malignant neoplasm of prostate: Secondary | ICD-10-CM

## 2015-10-15 ENCOUNTER — Other Ambulatory Visit (INDEPENDENT_AMBULATORY_CARE_PROVIDER_SITE_OTHER): Payer: Medicare Other

## 2015-10-15 ENCOUNTER — Ambulatory Visit (INDEPENDENT_AMBULATORY_CARE_PROVIDER_SITE_OTHER): Payer: Medicare Other

## 2015-10-15 VITALS — BP 130/80 | HR 61 | Temp 98.0°F | Ht 68.0 in | Wt 221.8 lb

## 2015-10-15 DIAGNOSIS — Z125 Encounter for screening for malignant neoplasm of prostate: Secondary | ICD-10-CM

## 2015-10-15 DIAGNOSIS — Z1211 Encounter for screening for malignant neoplasm of colon: Secondary | ICD-10-CM

## 2015-10-15 DIAGNOSIS — Z23 Encounter for immunization: Secondary | ICD-10-CM | POA: Diagnosis not present

## 2015-10-15 DIAGNOSIS — M25431 Effusion, right wrist: Secondary | ICD-10-CM

## 2015-10-15 DIAGNOSIS — E669 Obesity, unspecified: Secondary | ICD-10-CM

## 2015-10-15 DIAGNOSIS — Z0000 Encounter for general adult medical examination without abnormal findings: Secondary | ICD-10-CM | POA: Diagnosis not present

## 2015-10-15 DIAGNOSIS — Z13220 Encounter for screening for lipoid disorders: Secondary | ICD-10-CM | POA: Diagnosis not present

## 2015-10-15 DIAGNOSIS — Z1159 Encounter for screening for other viral diseases: Secondary | ICD-10-CM | POA: Diagnosis not present

## 2015-10-15 LAB — URIC ACID: Uric Acid, Serum: 6.3 mg/dL (ref 4.0–7.8)

## 2015-10-15 LAB — LIPID PANEL
CHOL/HDL RATIO: 4
CHOLESTEROL: 145 mg/dL (ref 0–200)
HDL: 34.4 mg/dL — ABNORMAL LOW (ref >39.00)
LDL Cholesterol: 74 mg/dL (ref 0–99)
NonHDL: 110.63
TRIGLYCERIDES: 184 mg/dL — AB (ref 0.0–149.0)
VLDL: 36.8 mg/dL (ref 0.0–40.0)

## 2015-10-15 LAB — PSA, MEDICARE: PSA: 1.59 ng/mL (ref 0.10–4.00)

## 2015-10-15 NOTE — Progress Notes (Signed)
Subjective:   Ryan Mathews is a 70 y.o. male who presents for an Initial Medicare Annual Wellness Visit.  Review of Systems  N/A Cardiac Risk Factors include: advanced age (>31men, >77 women);male gender;obesity (BMI >30kg/m2)    Objective:    Today's Vitals   10/15/15 0917  BP: 130/80  Pulse: 61  Temp: 98 F (36.7 C)  TempSrc: Oral  SpO2: 95%  Weight: 221 lb 12 oz (100.6 kg)  Height: 5\' 8"  (1.727 m)  PainSc: 0-No pain   Body mass index is 33.72 kg/m.  Current Medications (verified) Outpatient Encounter Prescriptions as of 10/15/2015  Medication Sig  . b complex vitamins tablet Take 1 tablet by mouth daily.  Marland Kitchen omeprazole-sodium bicarbonate (ZEGERID) 40-1100 MG per capsule Take 1 capsule by mouth daily as needed.  . vitamin C (ASCORBIC ACID) 500 MG tablet Take 500 mg by mouth daily.  . [DISCONTINUED] predniSONE (DELTASONE) 20 MG tablet Take two tablets daily for 3 days followed by one tablet daily for 4 days   No facility-administered encounter medications on file as of 10/15/2015.     Allergies (verified) Review of patient's allergies indicates no known allergies.   History: Past Medical History:  Diagnosis Date  . CMC arthritis, thumb, degenerative    right  . COPD (chronic obstructive pulmonary disease) ([place])   . Ex-smoker   . GERD (gastroesophageal reflux disease)   . History of chicken pox   . History of pneumonia 08/06/2009       . Numbness    R cheek stays numb  . Obesity (BMI 30-39.9) 08/09/2012   Past Surgical History:  Procedure Laterality Date  . ANKLE SURGERY Left 1966   with tendon repair with stainless steel wire  . INSERTION OF MESH Bilateral 09/06/2012   Procedure: INSERTION OF MESH;  Surgeon: Adin Hector, MD  . LAPAROSCOPIC INGUINAL HERNIA WITH UMBILICAL HERNIA Bilateral 09/06/2012   Procedure: LAPAROSCOPIC exploration and repair of hernias in bellybutton and bilateral groins with mesh;  Surgeon: Adin Hector, MD   Family History   Problem Relation Age of Onset  . Cancer Mother     breast  . Cancer Sister     lung (minimal smoker)   Social History   Occupational History  . Not on file.   Social History Main Topics  . Smoking status: Former Smoker    Packs/day: 2.00    Years: 40.00    Types: Cigarettes    Start date: 03/15/1967    Quit date: 03/14/2006  . Smokeless tobacco: Never Used  . Alcohol use No  . Drug use: No  . Sexual activity: Yes   Tobacco Counseling Counseling given: No   Activities of Daily Living In your present state of health, do you have any difficulty performing the following activities: 10/15/2015  Hearing? N  Vision? N  Difficulty concentrating or making decisions? N  Walking or climbing stairs? N  Dressing or bathing? N  Doing errands, shopping? N  Preparing Food and eating ? N  Using the Toilet? N  In the past six months, have you accidently leaked urine? N  Do you have problems with loss of bowel control? N  Managing your Medications? N  Managing your Finances? N  Housekeeping or managing your Housekeeping? N  Some recent data might be hidden    Immunizations and Health Maintenance Immunization History  Administered Date(s) Administered  . Influenza Split 11/13/2010  . Influenza Whole 12/13/2011  . Pneumococcal Polysaccharide-23 05/03/2011  .  Zoster 01/13/2012   There are no preventive care reminders to display for this patient.  Patient Care Team: Ria Bush, MD as PCP - General (Family Medicine) Rigoberto Noel, MD as Consulting Physician (Pulmonary Disease)    Assessment:   This is a routine wellness examination for [facility].  Hearing/Vision screen  Hearing Screening   125Hz  250Hz  500Hz  1000Hz  2000Hz  3000Hz  4000Hz  6000Hz  8000Hz   Right ear:   40 40 40  0    Left ear:   40 40 40  0      Visual Acuity Screening   Right eye Left eye Both eyes  Without correction: 20/20 20/20 20/20   With correction:       Dietary issues and exercise activities  discussed: Current Exercise Habits: The patient does not participate in regular exercise at present, Exercise limited by: None identified  Goals    . Increase physical activity          Starting 10/15/2015, I will continue to walk, to do yard work, and do other physical activities for at least 3 hrs daily.       Depression Screen PHQ 2/9 Scores 10/15/2015  PHQ - 2 Score 0    Fall Risk Fall Risk  10/15/2015  Falls in the past year? No    Cognitive Function: MMSE - Mini Mental State Exam 10/15/2015  Orientation to time 5  Orientation to Place 5  Registration 3  Attention/ Calculation 0  Recall 3  Language- name 2 objects 0  Language- repeat 1  Language- follow 3 step command 3  Language- read & follow direction 0  Write a sentence 0  Copy design 0  Total score 20   PLEASE NOTE: A Mini-Cog screen was completed. Maximum score is 20. A value of 0 denotes this part of [place] MMSE was not completed or the patient failed this part of the Mini-Cog screening.   Mini-Cog Screening Orientation to Time - Max 5 pts Orientation to Place - Max 5 pts Registration - Max 3 pts Recall - Max 3 pts Language Repeat - Max 1 pts Language Follow 3 Step Command - Max 3 pts   Screening Tests Health Maintenance  Topic Date Due  . INFLUENZA VACCINE  03/13/2016 (Originally 10/13/2015)  . COLONOSCOPY  03/13/2016 (Originally 03/20/1997)  . PNA vac Low Risk Adult (2 of 2 - PPSV23) 10/14/2016  . DTaP/Tdap/Td (2 - Td) 03/14/2022  . TETANUS/TDAP  03/14/2022  . ZOSTAVAX  Completed  . Hepatitis C Screening  Completed        Plan:     I have personally reviewed and addressed the Medicare Annual Wellness questionnaire and have noted the following in the patient's chart:  A. Medical and social history B. Use of alcohol, tobacco or illicit drugs  C. Current medications and supplements D. Functional ability and status E.  Nutritional status F.  Physical activity G. Advance directives H. List of other  physicians I.  Hospitalizations, surgeries, and ER visits in previous 12 months J.  [place] to include hearing, vision, cognitive, depression L. Referrals and appointments - none  In addition, I have reviewed and discussed with patient certain preventive protocols, quality metrics, and best practice recommendations. A written personalized care plan for preventive services as well as general preventive health recommendations were provided to patient.  See attached scanned questionnaire for additional information.   Signed,   Lindell Noe, MHA, BS, LPN Health Advisor

## 2015-10-15 NOTE — Progress Notes (Signed)
Pre visit review using our clinic review tool, if applicable. No additional management support is needed unless otherwise documented below in the visit note. 

## 2015-10-15 NOTE — Patient Instructions (Signed)
Ryan Mathews , Thank you for taking time to come for your Medicare Wellness Visit. I appreciate your ongoing commitment to your health goals. Please review the following plan we discussed and let me know if I can assist you in the future.   These are the goals we discussed: Goals    . Increase physical activity          Starting 10/15/2015, I will continue to walk, to do yard work, and do other physical activities for at least 3 hrs daily.        This is a list of the screening recommended for you and due dates:  Health Maintenance  Topic Date Due  . Flu Shot  03/13/2016*  . Colon Cancer Screening  03/13/2016*  . Pneumonia vaccines (2 of 2 - PPSV23) 10/14/2016  . DTaP/Tdap/Td vaccine (2 - Td) 03/14/2022  . Tetanus Vaccine  03/14/2022  . Shingles Vaccine  Completed  .  Hepatitis C: One time screening is recommended by Center for Disease Control  (CDC) for  adults born from [birth year] through 1963.   Completed  *Topic was postponed. The date shown is not the original due date.   Preventive Care for Adults  A healthy lifestyle and preventive care can promote health and wellness. Preventive health guidelines for adults include the following key practices.  . A routine yearly physical is a good way to check with your health care provider about your health and preventive screening. It is a chance to share any concerns and updates on your health and to receive a thorough exam.  . Visit your dentist for a routine exam and preventive care every 6 months. Brush your teeth twice a day and floss once a day. Good oral hygiene prevents tooth decay and gum disease.  . The frequency of eye exams is based on your age, health, family medical history, use  of contact lenses, and other factors. Follow your health care provider's ecommendations for frequency of eye exams.  . Eat a healthy diet. Foods like vegetables, fruits, whole grains, low-fat dairy products, and lean protein foods contain the nutrients  you need without too many calories. Decrease your intake of foods high in solid fats, added sugars, and salt. Eat the right amount of calories for you. Get information about a proper diet from your health care provider, if necessary.  . Regular physical exercise is one of the most important things you can do for your health. Most adults should get at least 150 minutes of moderate-intensity exercise (any activity that increases your heart rate and causes you to sweat) each week. In addition, most adults need muscle-strengthening exercises on 2 or more days a week.  Silver Sneakers may be a benefit available to you. To determine eligibility, you may visit the website: www.silversneakers.com or contact program at [phone] Mon-Fri between 8AM-8PM.   . Maintain a healthy weight. The body mass index (BMI) is a screening tool to identify possible weight problems. It provides an estimate of body fat based on height and weight. Your health care provider can find your BMI and can help you achieve or maintain a healthy weight.   For adults 20 years and older: ? A BMI below 18.5 is considered underweight. ? A BMI of 18.5 to 24.9 is normal. ? A BMI of 25 to 29.9 is considered overweight. ? A BMI of 30 and above is considered obese.   . Maintain normal blood lipids and cholesterol levels by exercising and minimizing your intake  of saturated fat. Eat a balanced diet with plenty of fruit and vegetables. Blood tests for lipids and cholesterol should begin at age 81 and be repeated every 5 years. If your lipid or cholesterol levels are high, you are over 50, or you are at high risk for heart disease, you may need your cholesterol levels checked more frequently. Ongoing high lipid and cholesterol levels should be treated with medicines if diet and exercise are not working.  . If you smoke, find out from your health care provider how to quit. If you do not use tobacco, please do not start.  . If you choose to  drink alcohol, please do not consume more than 2 drinks per day. One drink is considered to be 12 ounces (355 mL) of beer, 5 ounces (148 mL) of wine, or 1.5 ounces (44 mL) of liquor.  . If you are 77-3 years old, ask your health care provider if you should take aspirin to prevent strokes.  . Use sunscreen. Apply sunscreen liberally and repeatedly throughout the day. You should seek shade when your shadow is shorter than you. Protect yourself by wearing long sleeves, pants, a wide-brimmed hat, and sunglasses year round, whenever you are outdoors.  . Once a month, do a whole body skin exam, using a mirror to look at the skin on your back. Tell your health care provider of new moles, moles that have irregular borders, moles that are larger than a pencil eraser, or moles that have changed in shape or color.

## 2015-10-15 NOTE — Progress Notes (Signed)
PCP notes:   Health maintenance:  Hep C screening - completed Flu vaccine - addressed Tetanus - postponed/insurance Colonoscopy - will schedule PCV13 - administered  Abnormal screenings:   Hearing - failed  Patient concerns:   None  Nurse concerns:  None  Next PCP appt:   10/22/15 @ 1130

## 2015-10-16 LAB — HEPATITIS C ANTIBODY: HCV Ab: NEGATIVE

## 2015-10-17 NOTE — Progress Notes (Signed)
I reviewed health advisor's note, was available for consultation, and agree with documentation and plan.  

## 2015-10-22 ENCOUNTER — Ambulatory Visit (INDEPENDENT_AMBULATORY_CARE_PROVIDER_SITE_OTHER): Payer: Medicare Other | Admitting: Family Medicine

## 2015-10-22 ENCOUNTER — Encounter: Payer: Self-pay | Admitting: Family Medicine

## 2015-10-22 VITALS — BP 140/82 | HR 64 | Temp 97.9°F | Ht 68.0 in | Wt 222.5 lb

## 2015-10-22 DIAGNOSIS — M79602 Pain in left arm: Secondary | ICD-10-CM | POA: Diagnosis not present

## 2015-10-22 DIAGNOSIS — M79605 Pain in left leg: Secondary | ICD-10-CM

## 2015-10-22 DIAGNOSIS — E669 Obesity, unspecified: Secondary | ICD-10-CM

## 2015-10-22 DIAGNOSIS — J439 Emphysema, unspecified: Secondary | ICD-10-CM | POA: Diagnosis not present

## 2015-10-22 DIAGNOSIS — Z87891 Personal history of nicotine dependence: Secondary | ICD-10-CM | POA: Diagnosis not present

## 2015-10-22 DIAGNOSIS — M1811 Unilateral primary osteoarthritis of first carpometacarpal joint, right hand: Secondary | ICD-10-CM

## 2015-10-22 DIAGNOSIS — G8929 Other chronic pain: Secondary | ICD-10-CM

## 2015-10-22 NOTE — Patient Instructions (Addendum)
Bring me copy of advanced directives to update your chart.  Good to see you today, call us with questions.  Return as needed or in 1 year for next wellness visit Watch added sugars in diet to control cholesterol levels.  Consider lung cancer screening CT scan. Let us know if you'd like [place] to set this up.

## 2015-10-22 NOTE — Assessment & Plan Note (Signed)
Only notices on really humid days. rec use albuterol during these times.

## 2015-10-22 NOTE — Progress Notes (Signed)
Pre visit review using our clinic review tool, if applicable. No additional management support is needed unless otherwise documented below in the visit note. 

## 2015-10-22 NOTE — Progress Notes (Signed)
BP 140/82   Pulse 64   Temp 97.9 F (36.6 C) (Oral)   Ht _0  (1.727 m)   Wt 222 lb 8 oz (100.9 kg)   BMI 33.83 kg/m    CC: CPE Subjective:    Patient ID: Ryan Mathews, male    DOB: [birth year], 70 y.o.   MRN: [id number]  HPI: Ryan Mathews is a 70 y.o. male presenting on 10/22/2015 for Annual Exam   Saw Katha Cabal for medicare wellness visit last week. Note reviewed.  Youngest daughter getting married in September.  Recent joint pain/swelling at ankle and wrist with unrevealing labwork (ESR, urate, CBC). Treated with prednisone taper. Noticing mild left foot drop.   Preventative: Colon cancer screen - would like colonoscopy for October Prostate cancer screen - discussed, PSA reassuring. Would like to defer rectal exam.  Lung cancer screening - ex smoker quit 2008. Discussed screening -  Flu shot yearly  Tetanus unsure Pneumovax 2013, prevnar 10/2015 Shingles 2013 Advanced directive discussion: working with Manufacturing systems engineer to set this up. Would like wife to be HCPOA. Doesn't want prolonged life support. Asked to bring me a copy.  Seat belt use discussed Sunscreen use discussed. No changing moles on skin  Caffeine: 2 coffee, 2 tea/day Lives with wife and 4 cats and 1 dog, grown children Occupation: Solicitor Edu: [employer] Activity: stays active at work and in garden  Diet: some water, fruits/vegetables daily   Relevant past medical, surgical, family and social history reviewed and updated as indicated. Interim medical history since our last visit reviewed. Allergies and medications reviewed and updated. Current Outpatient Prescriptions on File Prior to Visit  Medication Sig  . b complex vitamins tablet Take 1 tablet by mouth daily.  Marland Kitchen omeprazole-sodium bicarbonate (ZEGERID) 40-1100 MG per capsule Take 1 capsule by mouth daily as needed.  . vitamin C (ASCORBIC ACID) 500 MG tablet Take 500 mg by mouth daily.   No current facility-administered medications on file  prior to visit.     Review of Systems Per HPI unless specifically indicated in ROS section     Objective:    BP 140/82   Pulse 64   Temp 97.9 F (36.6 C) (Oral)   Ht _1  (1.727 m)   Wt 222 lb 8 oz (100.9 kg)   BMI 33.83 kg/m   Wt Readings from Last 3 Encounters:  10/22/15 222 lb 8 oz (100.9 kg)  10/15/15 221 lb 12 oz (100.6 kg)  09/17/15 228 lb 8 oz (103.6 kg)    Physical Exam  Constitutional: He appears well-developed and well-nourished. No distress.  HENT:  Mouth/Throat: Oropharynx is clear and moist. No oropharyngeal exudate.  Eyes: Conjunctivae are normal. Pupils are equal, round, and reactive to light.  Neck: Normal range of motion. Neck supple. No thyromegaly present.  Cardiovascular: Normal rate, regular rhythm, normal heart sounds and intact distal pulses.   No murmur heard. Pulmonary/Chest: Effort normal and breath sounds normal. No respiratory distress. He has no wheezes. He has no rales.  Musculoskeletal: He exhibits edema (1+ on left).  Lymphadenopathy:    He has no cervical adenopathy.  Neurological:  5/5 strength bilateral feet Able to toe walk, trouble with heel walk at L heel  Skin: Skin is warm and dry. No rash noted.  Nursing note and vitals reviewed.  Results for orders placed or performed in visit on 10/15/15  Uric acid  Result Value Ref Range   Uric Acid, Serum 6.3 4.0 - 7.8 mg/dL  Hepatitis C antibody  Result Value Ref Range   HCV Ab NEGATIVE NEGATIVE  Lipid panel  Result Value Ref Range   Cholesterol 145 0 - 200 mg/dL   Triglycerides 184.0 (H) 0.0 - 149.0 mg/dL   HDL 34.40 (L) >39.00 mg/dL   VLDL 36.8 0.0 - 40.0 mg/dL   LDL Cholesterol 74 0 - 99 mg/dL   Total CHOL/HDL Ratio 4    NonHDL 110.63   PSA, Medicare  Result Value Ref Range   PSA 1.59 0.10 - 4.00 ng/ml      Assessment & Plan:   Problem List Items Addressed This Visit    Chronic leg pain    L ankle pain after remote injury s/p tendon repair as young adult. Now noticing  some weakness of dorsiflexors but overall stable. Will monitor for now, update [place] if worsening for MRI vs referral to ortho.      Emphysema of lung ([place]) - Primary    Only notices on really humid days. rec use albuterol during these times.       Ex-smoker    Discussed lung cancer screening CT.  Pt will consider and let me know if decides to pursue.      Obesity, Class I, BMI 30-34.9    Discussed healthy diet and lifestyle changes to affect sustainable weight loss.       Osteoarthritis of CMC joint of thumb    R>L. Now using supportive brace and doing better. Recent CBC, urate, ESR WNL       Other Visit Diagnoses   None.      Follow up plan: Return in about 1 year (around 10/21/2016), or as needed, for medicare wellness visit.  Ria Bush, MD

## 2015-10-22 NOTE — Assessment & Plan Note (Addendum)
Discussed lung cancer screening CT.  Pt will consider and let me know if decides to pursue.

## 2015-10-22 NOTE — Assessment & Plan Note (Addendum)
Discussed healthy diet and lifestyle changes to affect sustainable weight loss  

## 2015-10-22 NOTE — Assessment & Plan Note (Signed)
R>L. Now using supportive brace and doing better. Recent CBC, urate, ESR WNL

## 2015-10-22 NOTE — Assessment & Plan Note (Signed)
L ankle pain after remote injury s/p tendon repair as young adult. Now noticing some weakness of dorsiflexors but overall stable. Will monitor for now, update [place] if worsening for MRI vs referral to ortho.

## 2015-11-17 ENCOUNTER — Encounter: Payer: Self-pay | Admitting: Family Medicine

## 2015-11-21 ENCOUNTER — Encounter: Payer: Self-pay | Admitting: Family Medicine

## 2015-11-21 DIAGNOSIS — Z1211 Encounter for screening for malignant neoplasm of colon: Secondary | ICD-10-CM | POA: Insufficient documentation

## 2015-12-22 ENCOUNTER — Ambulatory Visit (INDEPENDENT_AMBULATORY_CARE_PROVIDER_SITE_OTHER): Payer: Medicare Other | Admitting: Family Medicine

## 2015-12-22 ENCOUNTER — Encounter: Payer: Self-pay | Admitting: Family Medicine

## 2015-12-22 VITALS — BP 136/82 | HR 76 | Temp 98.4°F | Wt 221.5 lb

## 2015-12-22 DIAGNOSIS — H43812 Vitreous degeneration, left eye: Secondary | ICD-10-CM | POA: Diagnosis not present

## 2015-12-22 DIAGNOSIS — Z23 Encounter for immunization: Secondary | ICD-10-CM

## 2015-12-22 DIAGNOSIS — H43392 Other vitreous opacities, left eye: Secondary | ICD-10-CM | POA: Diagnosis not present

## 2015-12-22 NOTE — Progress Notes (Signed)
Pre visit review using our clinic review tool, if applicable. No additional management support is needed unless otherwise documented below in the visit note. 

## 2015-12-22 NOTE — Progress Notes (Signed)
BP 136/82   Pulse 76   Temp 98.4 F (36.9 C) (Oral)   Wt 221 lb 8 oz (100.5 kg)   BMI 33.68 kg/m    CC: check eyes Subjective:    Patient ID: Ryan Mathews, male    DOB: 02-20-1946, 71 y.o.   MRN: [id number]  HPI: Ryan Mathews is a 70 y.o. male presenting on 12/22/2015 for Eye Problem (noticed floaters and flashy lights in left eye 3 days ago; no pain)   Over last 2 days noticing floaters in left eye in bright sunlight. Also noticing grey mass floating across vision. This has been associated with some flashing lights.   No headaches. No recent trauma, fall, injury.   Started while watching Sunday night football.  No prior h/o this.  Last saw eye doctor 2 yrs ago.   Relevant past medical, surgical, family and social history reviewed and updated as indicated. Interim medical history since our last visit reviewed. Allergies and medications reviewed and updated. Current Outpatient Prescriptions on File Prior to Visit  Medication Sig  . b complex vitamins tablet Take 1 tablet by mouth daily.  Marland Kitchen omeprazole-sodium bicarbonate (ZEGERID) 40-1100 MG per capsule Take 1 capsule by mouth daily as needed.  . vitamin C (ASCORBIC ACID) 500 MG tablet Take 500 mg by mouth daily.   No current facility-administered medications on file prior to visit.     Review of Systems Per HPI unless specifically indicated in ROS section     Objective:    BP 136/82   Pulse 76   Temp 98.4 F (36.9 C) (Oral)   Wt 221 lb 8 oz (100.5 kg)   BMI 33.68 kg/m   Wt Readings from Last 3 Encounters:  12/22/15 221 lb 8 oz (100.5 kg)  10/22/15 222 lb 8 oz (100.9 kg)  10/15/15 221 lb 12 oz (100.6 kg)    Physical Exam  Constitutional: He appears well-developed and well-nourished. No distress.  HENT:  Head: Normocephalic and atraumatic.  Mouth/Throat: Oropharynx is clear and moist. No oropharyngeal exudate.  Eyes: Conjunctivae and EOM are normal. Pupils are equal, round, and reactive to light.  Right conjunctiva is not injected. Right conjunctiva has no hemorrhage. Left conjunctiva is not injected. Left conjunctiva has no hemorrhage. No scleral icterus.  R fundi with brisk optic disc Limited L fundoscopic evaluation without frank obvious abnormality  Neck: Normal range of motion. Neck supple.  Nursing note and vitals reviewed.  Results for orders placed or performed in visit on 10/15/15  Uric acid  Result Value Ref Range   Uric Acid, Serum 6.3 4.0 - 7.8 mg/dL  Hepatitis C antibody  Result Value Ref Range   HCV Ab NEGATIVE NEGATIVE  Lipid panel  Result Value Ref Range   Cholesterol 145 0 - 200 mg/dL   Triglycerides 184.0 (H) 0.0 - 149.0 mg/dL   HDL 34.40 (L) >39.00 mg/dL   VLDL 36.8 0.0 - 40.0 mg/dL   LDL Cholesterol 74 0 - 99 mg/dL   Total CHOL/HDL Ratio 4    NonHDL 110.63   PSA, Medicare  Result Value Ref Range   PSA 1.59 0.10 - 4.00 ng/ml      Assessment & Plan:   Problem List Items Addressed This Visit    Vitreous floaters of left eye - Primary    Floaters with photopsia over last 2 days concern for retinal tear/detachment. Will refer back to ophtho and try to expedite eval. Pt agrees with plan.  Relevant Orders   Ambulatory referral to Ophthalmology    Other Visit Diagnoses    Need for influenza vaccination       Relevant Orders   Flu Vaccine QUAD 36+ mos PF IM (Fluarix & Fluzone Quad PF)       Follow up plan: Return if symptoms worsen or fail to improve.  Ria Bush, MD

## 2015-12-22 NOTE — Patient Instructions (Addendum)
Flu shot today I'm concerned for possible retinal tear. Let's send you to eye doctor - pass by Rosaria Ferries to schedule appointment.   Retinal Detachment Retinal detachment occurs when the thin membrane that covers the back of the eye (retina) separates (detaches) from the eyeball. The retina is the part of your eye that sends visual signals to the brain along the optic nerve. This allows you to see. Retinal detachment causes vision loss. This can range from blurriness and cloudiness to complete blindness. Sometimes vision improves or returns after treatment. Loss of central vision is more likely to be permanent than vision loss that affects side (peripheral) vision. There are 3 types of retinal detachment:  Rhegmatogenous.  This is the most common type.  It happens when a tear in the retina lets fluid into the area behind the retina.  This type of detachment separates the retina from the layer of cells beneath it (retinal pigment epithelium [RPE]).  Tractional.  This type occurs when scar tissue on the surface of the retina contracts.  This causes the retina to detach from the RPE.  Exudative.  This type is not caused by a tear in the retina.  This type of detachment happens when a disorder or an injury causes fluid to leak into the area behind the retina. CAUSES  Retinal detachment may be caused by:  Holes or tears in the retina.  Eye trauma or injury.  Certain diseases, including diabetes.  Other eye conditions, such as being nearsighted or having degenerative myopia. RISK FACTORS Retinal detachment is more likely to develop if:  You have a family history of retinal detachment.  You have had a previous retinal detachment.  You are 5 years of age or older.  You are male.  You have had an eye injury.  You have had surgery for cataracts.  You are nearsighted, or you have certain other eye problems. SYMPTOMS  Symptoms of retinal detachment include:  Seeing flashes  of light.  Seeing floating specks or cobwebs (floaters) in front of your eye.  Seeing a black area in any part of your vision.  Having fuzzy vision.  Seeing a floating empty circle in front of you. DIAGNOSIS  Your health care provider may suspect retinal detachment based on your signs and symptoms. You may be referred to a health care provider who specializes in eye conditions (ophthalmologist). You will have an eye exam. Tests may also be done, including:  Tonometry. This test checks the pressure inside your eye.  Refraction test. This test checks your prescription for corrective lenses.  Visual acuity test. This is the standard eye test in which you read letters on a chart.  Fluorescein angiography. This test checks the flow of blood in the retina.  Ophthalmoscopy. In this test, your health care provider examines the back of your eye.  Ultrasound. This test is an advanced scanning of the eye.  Color vision test. This test checks your ability to see colors.  Slit-lamp examination. This test checks the front of your eye. TREATMENT  Treatment for retinal detachment depends on your condition. Treatment may include:  Laser treatment. This may be the only treatment you need if your retina has only a small tear or hole and has not completely detached.  Freeze treatment (cryopexy). This treatment involves freezing the area around the hole to help in reattaching the retina.  Surgery. A variety of surgeries may be done for retinal detachment:  A small band (scleral buckle) may be used to  push the eyeball against the retina. The retina is then reattached using lasers or cryopexy.  An incision may be made in the white of the eye (sclera) to remove the gel inside it (vitrectomy). Gas is then injected into the sclera to push the eye back against the retina (pneumatic retinopexy). The retina is then reattached using lasers or cryopexy. HOME CARE INSTRUCTIONS   Wear eye protection to  prevent injuries.  If you have diabetes, control your blood pressure.  Keep all follow-up visits as directed by your health care provider. This is important. If you have had surgery to treat retinal detachment, follow your health care provider's instructions for home care. SEEK MEDICAL CARE IF:  You have complications after retinal detachment surgery.  Your vision is not improving as expected.  Your vision is getting worse. SEEK IMMEDIATE MEDICAL CARE IF:   You suddenly see flashing lights or floaters.  You suddenly have a dark area in your field of vision, especially in the lower part. This can lead to a loss of central vision.   This information is not intended to replace advice given to you by your health care provider. Make sure you discuss any questions you have with your health care provider.   Document Released: 02/28/2005 Document Revised: 11/19/2014 Document Reviewed: 01/08/2014 Elsevier Interactive Patient Education [employer].

## 2015-12-22 NOTE — Assessment & Plan Note (Signed)
Floaters with photopsia over last 2 days concern for retinal tear/detachment. Will refer back to ophtho and try to expedite eval. Pt agrees with plan.

## 2016-01-13 DIAGNOSIS — H43812 Vitreous degeneration, left eye: Secondary | ICD-10-CM | POA: Diagnosis not present

## 2016-07-12 DIAGNOSIS — H2513 Age-related nuclear cataract, bilateral: Secondary | ICD-10-CM | POA: Diagnosis not present

## 2016-07-12 DIAGNOSIS — H40053 Ocular hypertension, bilateral: Secondary | ICD-10-CM | POA: Diagnosis not present

## 2016-07-12 DIAGNOSIS — H52203 Unspecified astigmatism, bilateral: Secondary | ICD-10-CM | POA: Diagnosis not present

## 2016-08-01 ENCOUNTER — Telehealth: Payer: Self-pay | Admitting: Family Medicine

## 2016-08-01 NOTE — Telephone Encounter (Signed)
Patient Name: Ryan Mathews DOB: 11-21-1945 Initial Comment Caller is needing to schedule an appt. States he is having some slight chest pain on the left side. Nurse Assessment Nurse: Ronnald Ramp, RN, Miranda Date/Time (Eastern Time): 08/01/2016 11:40:49 AM Confirm and document reason for call. If symptomatic, describe symptoms. ---Caller states he has been having pain in the left side of his chest for 3-4 weeks. He feels like it becoming more frequent. The pain last a few seconds. Does the patient have any new or worsening symptoms? ---Yes Will a triage be completed? ---Yes Related visit to physician within the last 2 weeks? ---No Does the PT have any chronic conditions? (i.e. diabetes, asthma, etc.) ---No Is this a behavioral health or substance abuse call? ---No Guidelines Guideline Title Affirmed Question Affirmed Notes Chest Pain [1] Intermittent chest pain from "angina" AND [2] NO increase in severity or frequency Final Disposition User See PCP When Office is Open (within 3 days) Ronnald Ramp, RN, Miranda Comments Appt scheduled for Wed at 10:15am with Dr. Danise Mina. Disagree/Comply: Comply

## 2016-08-01 NOTE — Telephone Encounter (Signed)
Pt has appt on 08/03/16 at 10:15 with Dr Darnell Level.

## 2016-08-03 ENCOUNTER — Encounter: Payer: Self-pay | Admitting: Family Medicine

## 2016-08-03 ENCOUNTER — Ambulatory Visit (INDEPENDENT_AMBULATORY_CARE_PROVIDER_SITE_OTHER): Payer: Medicare Other | Admitting: Family Medicine

## 2016-08-03 VITALS — BP 148/84 | HR 65 | Temp 98.2°F | Ht 68.0 in | Wt 225.5 lb

## 2016-08-03 DIAGNOSIS — J439 Emphysema, unspecified: Secondary | ICD-10-CM | POA: Diagnosis not present

## 2016-08-03 DIAGNOSIS — R079 Chest pain, unspecified: Secondary | ICD-10-CM

## 2016-08-03 DIAGNOSIS — K219 Gastro-esophageal reflux disease without esophagitis: Secondary | ICD-10-CM

## 2016-08-03 DIAGNOSIS — R071 Chest pain on breathing: Secondary | ICD-10-CM | POA: Insufficient documentation

## 2016-08-03 NOTE — Assessment & Plan Note (Signed)
Stable off daily medication.

## 2016-08-03 NOTE — Patient Instructions (Addendum)
EKG looked ok.  This could be musculoskeletal pain from rib strain  May use ice or heating pad to area.  May use tylenol or aleve with meal.  Let us know if worsening pain or not improving with treatment.

## 2016-08-03 NOTE — Assessment & Plan Note (Signed)
Stable on zegerid.

## 2016-08-03 NOTE — Progress Notes (Signed)
BP (!) 148/84   Pulse 65   Temp 98.2 F (36.8 C)   Ht 5\' 8"  (1.727 m)   Wt 225 lb 8 oz (102.3 kg)   SpO2 98%   BMI 34.29 kg/m    CC: "i've been having a little bit of chest pain" Subjective:    Patient ID: Ryan Mathews, male    DOB: 07/10/1945, 71 y.o.   MRN: [id number]  HPI: Ryan Mathews is a 71 y.o. male presenting on 08/03/2016 for Acute Visit (chest pains x 3 weeks intermittently)   3 week h/o intermittent left sided chest pains described as dull pressure, becoming more frequent. Lasts a few seconds. No aggravating or alleviating factors. Noticing intermittent L arm tingling. This does improve with belching. Not exertional or relieved by stress.   Worked in yard in heat over weekend, didn't have any chest pain.  He did have URI several weeks ago - with residual cough.   Known GERD stable on zegerid.   Relevant past medical, surgical, family and social history reviewed and updated as indicated. Interim medical history since our last visit reviewed. Allergies and medications reviewed and updated. Outpatient Medications Prior to Visit  Medication Sig Dispense Refill  . b complex vitamins tablet Take 1 tablet by mouth daily.    Marland Kitchen omeprazole-sodium bicarbonate (ZEGERID) 40-1100 MG per capsule Take 1 capsule by mouth daily as needed.    . vitamin C (ASCORBIC ACID) 500 MG tablet Take 500 mg by mouth daily.     No facility-administered medications prior to visit.      Per HPI unless specifically indicated in ROS section below Review of Systems     Objective:    BP (!) 148/84   Pulse 65   Temp 98.2 F (36.8 C)   Ht 5\' 8"  (1.727 m)   Wt 225 lb 8 oz (102.3 kg)   SpO2 98%   BMI 34.29 kg/m   Wt Readings from Last 3 Encounters:  08/03/16 225 lb 8 oz (102.3 kg)  12/22/15 221 lb 8 oz (100.5 kg)  10/22/15 222 lb 8 oz (100.9 kg)    Physical Exam  Constitutional: He appears well-developed and well-nourished. No distress.  HENT:  Mouth/Throat: Oropharynx is  clear and moist. No oropharyngeal exudate.  Eyes: Conjunctivae and EOM are normal. Pupils are equal, round, and reactive to light. No scleral icterus.  Neck: Normal range of motion. Neck supple.  Cardiovascular: Normal rate, regular rhythm, normal heart sounds and intact distal pulses.   No murmur heard. Pulmonary/Chest: Effort normal and breath sounds normal. No respiratory distress. He has no wheezes. He has no rales. He exhibits tenderness (point tender L ~6th7/th intercostal).    Reproducible tenderness to palpation  Musculoskeletal: He exhibits no edema.  Skin: Skin is warm and dry. No rash noted.  Psychiatric: He has a normal mood and affect.  Nursing note and vitals reviewed.  Results for orders placed or performed in visit on 10/15/15  Uric acid  Result Value Ref Range   Uric Acid, Serum 6.3 4.0 - 7.8 mg/dL  Hepatitis C antibody  Result Value Ref Range   HCV Ab NEGATIVE NEGATIVE  Lipid panel  Result Value Ref Range   Cholesterol 145 0 - 200 mg/dL   Triglycerides 184.0 (H) 0.0 - 149.0 mg/dL   HDL 34.40 (L) >39.00 mg/dL   VLDL 36.8 0.0 - 40.0 mg/dL   LDL Cholesterol 74 0 - 99 mg/dL   Total CHOL/HDL Ratio 4  NonHDL 110.63   PSA, Medicare  Result Value Ref Range   PSA 1.59 0.10 - 4.00 ng/ml   EKG - NSR rate 60s, normal axis, mild LAD, no acute ST/T changes.     Assessment & Plan:   Problem List Items Addressed This Visit    Chest pain - Primary    Does not sound cardiac. Reproducible chest discomfort to palpation of left intercostals. Discussed supportive care including heating pad, low dose NSAID or tylenol, and update if not improving with treatment. Pt agrees with plan.  Age is risk factor.       Relevant Orders   EKG 12-Lead (Completed)   Emphysema of lung (HCC)    Stable off daily medication.       GERD (gastroesophageal reflux disease)    Stable on zegerid.           Follow up plan: Return if symptoms worsen or fail to improve.  Ria Bush, MD

## 2016-08-03 NOTE — Assessment & Plan Note (Signed)
Does not sound cardiac. Reproducible chest discomfort to palpation of left intercostals. Discussed supportive care including heating pad, low dose NSAID or tylenol, and update if not improving with treatment. Pt agrees with plan.  Age is risk factor.

## 2016-12-22 DIAGNOSIS — Z23 Encounter for immunization: Secondary | ICD-10-CM | POA: Diagnosis not present

## 2017-04-14 ENCOUNTER — Encounter: Payer: Self-pay | Admitting: Family Medicine

## 2017-04-14 ENCOUNTER — Ambulatory Visit (INDEPENDENT_AMBULATORY_CARE_PROVIDER_SITE_OTHER): Payer: Medicare Other | Admitting: Family Medicine

## 2017-04-14 VITALS — BP 140/82 | HR 71 | Temp 98.3°F | Wt 229.0 lb

## 2017-04-14 DIAGNOSIS — J22 Unspecified acute lower respiratory infection: Secondary | ICD-10-CM | POA: Insufficient documentation

## 2017-04-14 DIAGNOSIS — J342 Deviated nasal septum: Secondary | ICD-10-CM | POA: Diagnosis not present

## 2017-04-14 DIAGNOSIS — J439 Emphysema, unspecified: Secondary | ICD-10-CM | POA: Diagnosis not present

## 2017-04-14 HISTORY — DX: Deviated nasal septum: J34.2

## 2017-04-14 MED ORDER — AZITHROMYCIN 250 MG PO TABS: ORAL_TABLET | ORAL | 0 refills | Status: DC

## 2017-04-14 MED ORDER — GUAIFENESIN-CODEINE 100-10 MG/5ML PO SYRP: 5.0000 mL | ORAL_SOLUTION | Freq: Two times a day (BID) | ORAL | 0 refills | Status: DC | PRN

## 2017-04-14 NOTE — Assessment & Plan Note (Signed)
Anticipate viral given short duration. However he does have comorbidities. Rx cheratussin for night time, supportive care as per instructions, and WASP for zpack with indications when to fill. Pt agrees with plan.

## 2017-04-14 NOTE — Patient Instructions (Addendum)
I think you have viral respiratory infection. Treat with cheratussin cough syrup. Sedation precautions.  Push fluids and rest. May continue mucinex with a large glass of water to help mobilize mucous.  May take ibuprofen 400-600mg  with meals for inflammation.  If fever >101 or worsening productive cough or shortness of breath, fill antibiotic prescription provided today.

## 2017-04-14 NOTE — Assessment & Plan Note (Signed)
Not in acute flare.

## 2017-04-14 NOTE — Progress Notes (Signed)
BP 140/82 (BP Location: Left Arm, Patient Position: Sitting, Cuff Size: Large)   Pulse 71   Temp 98.3 F (36.8 C) (Oral)   Wt 229 lb (103.9 kg)   SpO2 95%   BMI 34.82 kg/m    CC: URI Subjective:    Patient ID: Ryan Mathews, male    DOB: [birth year], 72 y.o.   MRN: [id number]  HPI: Ryan Mathews is a 72 y.o. male presenting on 04/14/2017 for URI (sore throat, head congestion, drainage and productive cough which is causing sleep disturbance. Started 04/09/17. Tried Mucinex and Sudafed, barely helpful. )   6d h/o ST, PNDrainage, chest and head congestion, productive cough of mucous. Cough affecting sleep.   No fevers/chills, ear or tooth pain, dyspnea or wheezing.   Son dx with strep throat.  Carries dx COPD. Ex smoker.  Taking pseudophed, mucinex, robitussin.   3 wks ago with cold that resolved with pseudophed and mucinex.   Relevant past medical, surgical, family and social history reviewed and updated as indicated. Interim medical history since our last visit reviewed. Allergies and medications reviewed and updated. Outpatient Medications Prior to Visit  Medication Sig Dispense Refill  . b complex vitamins tablet Take 1 tablet by mouth daily.    Marland Kitchen omeprazole-sodium bicarbonate (ZEGERID) 40-1100 MG per capsule Take 1 capsule by mouth daily as needed.    . vitamin C (ASCORBIC ACID) 500 MG tablet Take 500 mg by mouth daily.     No facility-administered medications prior to visit.      Per HPI unless specifically indicated in ROS section below Review of Systems     Objective:    BP 140/82 (BP Location: Left Arm, Patient Position: Sitting, Cuff Size: Large)   Pulse 71   Temp 98.3 F (36.8 C) (Oral)   Wt 229 lb (103.9 kg)   SpO2 95%   BMI 34.82 kg/m   Wt Readings from Last 3 Encounters:  04/14/17 229 lb (103.9 kg)  08/03/16 225 lb 8 oz (102.3 kg)  12/22/15 221 lb 8 oz (100.5 kg)    Physical Exam  Constitutional: He appears well-developed and  well-nourished. No distress.  HENT:  Head: Normocephalic and atraumatic.  Right Ear: Hearing, tympanic membrane, external ear and ear canal normal.  Left Ear: Hearing, tympanic membrane, external ear and ear canal normal.  Nose: Mucosal edema, rhinorrhea and septal deviation (marked - collapse into left nasal passage) present. Right sinus exhibits no maxillary sinus tenderness and no frontal sinus tenderness. Left sinus exhibits no maxillary sinus tenderness and no frontal sinus tenderness.  Mouth/Throat: Uvula is midline and mucous membranes are normal. Posterior oropharyngeal edema and posterior oropharyngeal erythema present. No oropharyngeal exudate or tonsillar abscesses.  Eyes: Conjunctivae and EOM are normal. Pupils are equal, round, and reactive to light. No scleral icterus.  Neck: Normal range of motion. Neck supple.  Cardiovascular: Normal rate, regular rhythm, normal heart sounds and intact distal pulses.  No murmur heard. Pulmonary/Chest: Effort normal and breath sounds normal. No respiratory distress. He has no wheezes. He has no rales.  Lungs clear  Lymphadenopathy:    He has no cervical adenopathy.  Skin: Skin is warm and dry. No rash noted.  Nursing note and vitals reviewed.      Assessment & Plan:   Problem List Items Addressed This Visit    Acute respiratory infection - Primary    Anticipate viral given short duration. However he does have comorbidities. Rx cheratussin for night time, supportive care as  per instructions, and WASP for zpack with indications when to fill. Pt agrees with plan.       Relevant Medications   azithromycin (ZITHROMAX) 250 MG tablet   Emphysema of lung ([place])    Not in acute flare.      Relevant Medications   azithromycin (ZITHROMAX) 250 MG tablet   guaiFENesin-codeine (CHERATUSSIN AC) 100-10 MG/5ML syrup   Nasal septal deviation       Follow up plan: Return if symptoms worsen or fail to improve.  Ria Bush, MD

## 2017-04-21 ENCOUNTER — Ambulatory Visit (INDEPENDENT_AMBULATORY_CARE_PROVIDER_SITE_OTHER): Payer: Medicare Other | Admitting: Family Medicine

## 2017-04-21 ENCOUNTER — Encounter: Payer: Self-pay | Admitting: Family Medicine

## 2017-04-21 ENCOUNTER — Other Ambulatory Visit: Payer: Self-pay | Admitting: Acute Care

## 2017-04-21 VITALS — BP 142/82 | HR 68 | Temp 98.0°F | Ht 68.0 in | Wt 223.0 lb

## 2017-04-21 DIAGNOSIS — E669 Obesity, unspecified: Secondary | ICD-10-CM | POA: Diagnosis not present

## 2017-04-21 DIAGNOSIS — Z1211 Encounter for screening for malignant neoplasm of colon: Secondary | ICD-10-CM

## 2017-04-21 DIAGNOSIS — Z125 Encounter for screening for malignant neoplasm of prostate: Secondary | ICD-10-CM

## 2017-04-21 DIAGNOSIS — Z7189 Other specified counseling: Secondary | ICD-10-CM | POA: Diagnosis not present

## 2017-04-21 DIAGNOSIS — J22 Unspecified acute lower respiratory infection: Secondary | ICD-10-CM | POA: Diagnosis not present

## 2017-04-21 DIAGNOSIS — Z87891 Personal history of nicotine dependence: Secondary | ICD-10-CM | POA: Diagnosis not present

## 2017-04-21 DIAGNOSIS — Z23 Encounter for immunization: Secondary | ICD-10-CM | POA: Diagnosis not present

## 2017-04-21 DIAGNOSIS — Z0000 Encounter for general adult medical examination without abnormal findings: Secondary | ICD-10-CM | POA: Diagnosis not present

## 2017-04-21 DIAGNOSIS — E785 Hyperlipidemia, unspecified: Secondary | ICD-10-CM

## 2017-04-21 DIAGNOSIS — E66811 Obesity, class 1: Secondary | ICD-10-CM

## 2017-04-21 DIAGNOSIS — J439 Emphysema, unspecified: Secondary | ICD-10-CM

## 2017-04-21 DIAGNOSIS — K219 Gastro-esophageal reflux disease without esophagitis: Secondary | ICD-10-CM | POA: Diagnosis not present

## 2017-04-21 DIAGNOSIS — Z122 Encounter for screening for malignant neoplasm of respiratory organs: Secondary | ICD-10-CM

## 2017-04-21 LAB — LIPID PANEL
CHOLESTEROL: 119 mg/dL (ref 0–200)
HDL: 28.6 mg/dL — AB (ref >39.00)
LDL Cholesterol: 68 mg/dL (ref 0–99)
NonHDL: 90.49
TRIGLYCERIDES: 112 mg/dL (ref 0.0–149.0)
Total CHOL/HDL Ratio: 4
VLDL: 22.4 mg/dL (ref 0.0–40.0)

## 2017-04-21 LAB — PSA, MEDICARE: PSA: 0.97 ng/ml (ref 0.10–4.00)

## 2017-04-21 LAB — BASIC METABOLIC PANEL
BUN: 13 mg/dL (ref 6–23)
CO2: 30 meq/L (ref 19–32)
Calcium: 9.1 mg/dL (ref 8.4–10.5)
Chloride: 103 mEq/L (ref 96–112)
Creatinine, Ser: 0.96 mg/dL (ref 0.40–1.50)
GFR: 82.28 mL/min (ref >60.00)
GLUCOSE: 103 mg/dL — AB (ref 70–99)
POTASSIUM: 4 meq/L (ref 3.5–5.1)
SODIUM: 138 meq/L (ref 135–145)

## 2017-04-21 LAB — TSH: TSH: 2.71 u[IU]/mL (ref 0.35–4.50)

## 2017-04-21 NOTE — Assessment & Plan Note (Signed)
Update FLP. Not on medication. The 10-year ASCVD risk score Mikey Bussing DC Brooke Bonito., et al., 2013) is: 21.4%   Values used to calculate the score:     Age: 72 years     Sex: Male     Is Non-Hispanic African American: No     Diabetic: No     Tobacco smoker: No     Systolic Blood Pressure: 163 mmHg     Is BP treated: No     HDL Cholesterol: 34.4 mg/dL     Total Cholesterol: 145 mg/dL

## 2017-04-21 NOTE — Patient Instructions (Addendum)
We will refer you for colonoscopy We will refer you for discussion on lung cancer screening Final pneumovax today.  Labs today.  If interested, check with pharmacy about new 2 shot shingles series (shingrix).  You are doing well today. Return as needed or in 1 year for next medicare wellness visit   Health Maintenance, Male A healthy lifestyle and preventive care is important for your health and wellness. Ask your health care provider about what schedule of regular examinations is right for you. What should I know about weight and diet? Eat a Healthy Diet  Eat plenty of vegetables, fruits, whole grains, low-fat dairy products, and lean protein.  Do not eat a lot of foods high in solid fats, added sugars, or salt.  Maintain a Healthy Weight Regular exercise can help you achieve or maintain a healthy weight. You should:  Do at least 150 minutes of exercise each week. The exercise should increase your heart rate and make you sweat (moderate-intensity exercise).  Do strength-training exercises at least twice a week.  Watch Your Levels of Cholesterol and Blood Lipids  Have your blood tested for lipids and cholesterol every 5 years starting at 70 years of age. If you are at high risk for heart disease, you should start having your blood tested when you are 70 years old. You may need to have your cholesterol levels checked more often if: ? Your lipid or cholesterol levels are high. ? You are older than 70 years of age. ? You are at high risk for heart disease.  What should I know about cancer screening? Many types of cancers can be detected early and may often be prevented. Lung Cancer  You should be screened every year for lung cancer if: ? You are a current smoker who has smoked for at least 30 years. ? You are a former smoker who has quit within the past 15 years.  Talk to your health care provider about your screening options, when you should start screening, and how often you  should be screened.  Colorectal Cancer  Routine colorectal cancer screening usually begins at 70 years of age and should be repeated every 5-10 years until you are 70 years old. You may need to be screened more often if early forms of precancerous polyps or small growths are found. Your health care provider may recommend screening at an earlier age if you have risk factors for colon cancer.  Your health care provider may recommend using home test kits to check for hidden blood in the stool.  A small camera at the end of a tube can be used to examine your colon (sigmoidoscopy or colonoscopy). This checks for the earliest forms of colorectal cancer.  Prostate and Testicular Cancer  Depending on your age and overall health, your health care provider may do certain tests to screen for prostate and testicular cancer.  Talk to your health care provider about any symptoms or concerns you have about testicular or prostate cancer.  Skin Cancer  Check your skin from head to toe regularly.  Tell your health care provider about any new moles or changes in moles, especially if: ? There is a change in a mole's size, shape, or color. ? You have a mole that is larger than a pencil eraser.  Always use sunscreen. Apply sunscreen liberally and repeat throughout the day.  Protect yourself by wearing long sleeves, pants, a wide-brimmed hat, and sunglasses when outside.  What should I know about heart disease, diabetes,  and high blood pressure?  If you are 37-20 years of age, have your blood pressure checked every 3-5 years. If you are 52 years of age or older, have your blood pressure checked every year. You should have your blood pressure measured twice-once when you are at a hospital or clinic, and once when you are not at a hospital or clinic. Record the average of the two measurements. To check your blood pressure when you are not at a hospital or clinic, you can use: ? An automated blood pressure  machine at a pharmacy. ? A home blood pressure monitor.  Talk to your health care provider about your target blood pressure.  If you are between 47-66 years old, ask your health care provider if you should take aspirin to prevent heart disease.  Have regular diabetes screenings by checking your fasting blood sugar level. ? If you are at a normal weight and have a low risk for diabetes, have this test once every three years after the age of 14. ? If you are overweight and have a high risk for diabetes, consider being tested at a younger age or more often.  A one-time screening for abdominal aortic aneurysm (AAA) by ultrasound is recommended for men aged 68-75 years who are current or former smokers. What should I know about preventing infection? Hepatitis B If you have a higher risk for hepatitis B, you should be screened for this virus. Talk with your health care provider to find out if you are at risk for hepatitis B infection. Hepatitis C Blood testing is recommended for:  Everyone born from [birth year] through 1963.  Anyone with known risk factors for hepatitis C.  Sexually Transmitted Diseases (STDs)  You should be screened each year for STDs including gonorrhea and chlamydia if: ? You are sexually active and are younger than 70 years of age. ? You are older than 70 years of age and your health care provider tells you that you are at risk for this type of infection. ? Your sexual activity has changed since you were last screened and you are at an increased risk for chlamydia or gonorrhea. Ask your health care provider if you are at risk.  Talk with your health care provider about whether you are at high risk of being infected with HIV. Your health care provider may recommend a prescription medicine to help prevent HIV infection.  What else can I do?  Schedule regular health, dental, and eye exams.  Stay current with your vaccines (immunizations).  Do not use any tobacco products,  such as cigarettes, chewing tobacco, and e-cigarettes. If you need help quitting, ask your health care provider.  Limit alcohol intake to no more than 2 drinks per day. One drink equals 12 ounces of beer, 5 ounces of wine, or 1 ounces of hard liquor.  Do not use street drugs.  Do not share needles.  Ask your health care provider for help if you need support or information about quitting drugs.  Tell your health care provider if you often feel depressed.  Tell your health care provider if you have ever been abused or do not feel safe at home. This information is not intended to replace advice given to you by your health care provider. Make sure you discuss any questions you have with your health care provider. Document Released: 08/27/2007 Document Revised: 10/28/2015 Document Reviewed: 12/02/2014 Elsevier Interactive Patient Education  [employer].

## 2017-04-21 NOTE — Assessment & Plan Note (Addendum)
By CT. Asxs, not on medication. 2nd pneumovax today.

## 2017-04-21 NOTE — Assessment & Plan Note (Signed)

## 2017-04-21 NOTE — Assessment & Plan Note (Signed)
Never returned GI calls to schedule colonoscopy - we have referred again. He agrees to go.

## 2017-04-21 NOTE — Assessment & Plan Note (Signed)
Advanced directive discussion: working with estate planner to set this up. Would like wife to be HCPOA. Doesn't want prolonged life support. Asked to bring me a copy.  

## 2017-04-21 NOTE — Assessment & Plan Note (Signed)
Chronic, stable on zegerid.

## 2017-04-21 NOTE — Progress Notes (Addendum)
BP (!) 142/82 (BP Location: Right Arm, Cuff Size: Large)   Pulse 68   Temp 98 F (36.7 C) (Oral)   Ht 5\' 8"  (1.727 m)   Wt 223 lb (101.2 kg)   SpO2 96%   BMI 33.91 kg/m     Hearing Screening   125Hz  250Hz  500Hz  1000Hz  2000Hz  3000Hz  4000Hz  6000Hz  8000Hz   Right ear:   20 40 20  0    Left ear:   40 40 20  40    Vision Screening Comments: Last eye exam, 07/2016   CC: medicare wellness visit Subjective:    Patient ID: Ryan Mathews, male    DOB: 08-Mar-1946, 72 y.o.   MRN: [id number]  HPI: Ryan Mathews is a 72 y.o. male presenting on 04/21/2017 for Medicare Wellness   Did not see Katha Cabal this year. Passes fall and depression screens. URI last week overall feeling better - did not need zpack. Cough syrup worsened sxs so he stopped.   Preventative: Colon cancer screen - would like colonoscopy ([place]). Never returned GI calls. Agrees to referral again today.  Prostate cancer screen - discussed, PSA reassuring. Agrees to DRE today and if reassuring consider spacing out.  Lung cancer screening - ex smoker quit 2008. Discussed screening - pt interested. Will refer Flu shot yearly  Tetanus unsure Pneumovax 2013, prevnar 10/2015, pneumovax today zostavax 2013 shingrix - discussed.  Advanced directive discussion: working with Manufacturing systems engineer to set this up. Would like wife to be HCPOA. Doesn't want prolonged life support. Asked to bring me a copy.  Seat belt use discussed Sunscreen use discussed. No changing moles on skin Ex smoker - quit 2008. >30 PY hx Alcohol - seldom  Caffeine: 2 coffee, 2 tea/day Lives with wife and 4 cats and 1 dog, grown children Occupation: Solicitor Edu: HS Activity: stays active at work and in garden  Diet: some water, fruits/vegetables daily   Relevant past medical, surgical, family and social history reviewed and updated as indicated. Interim medical history since our last visit reviewed. Allergies and medications reviewed and  updated. Outpatient Medications Prior to Visit  Medication Sig Dispense Refill  . b complex vitamins tablet Take 1 tablet by mouth daily.    Marland Kitchen omeprazole-sodium bicarbonate (ZEGERID) 40-1100 MG per capsule Take 1 capsule by mouth daily as needed.    . vitamin C (ASCORBIC ACID) 500 MG tablet Take 500 mg by mouth daily.    Marland Kitchen azithromycin (ZITHROMAX) 250 MG tablet Take two tablets on day one followed by one tablet on days 2-5 6 each 0  . guaiFENesin-codeine (CHERATUSSIN AC) 100-10 MG/5ML syrup Take 5 mLs by mouth 2 (two) times daily as needed. 140 mL 0   No facility-administered medications prior to visit.      Per HPI unless specifically indicated in ROS section below Review of Systems     Objective:    BP (!) 142/82 (BP Location: Right Arm, Cuff Size: Large)   Pulse 68   Temp 98 F (36.7 C) (Oral)   Ht 5\' 8"  (1.727 m)   Wt 223 lb (101.2 kg)   SpO2 96%   BMI 33.91 kg/m   Wt Readings from Last 3 Encounters:  04/21/17 223 lb (101.2 kg)  04/14/17 229 lb (103.9 kg)  08/03/16 225 lb 8 oz (102.3 kg)    Physical Exam  Constitutional: He is oriented to person, place, and time. He appears well-developed and well-nourished. No distress.  HENT:  Head: Normocephalic and atraumatic.  Right Ear: Hearing, tympanic membrane, external ear and ear canal normal.  Left Ear: Hearing, tympanic membrane, external ear and ear canal normal.  Nose: Nose normal.  Mouth/Throat: Uvula is midline, oropharynx is clear and moist and mucous membranes are normal. No oropharyngeal exudate, posterior oropharyngeal edema or posterior oropharyngeal erythema.  Eyes: Conjunctivae and EOM are normal. Pupils are equal, round, and reactive to light. No scleral icterus.  Neck: Normal range of motion. Neck supple. Carotid bruit is not present. No thyromegaly present.  Cardiovascular: Normal rate, regular rhythm, normal heart sounds and intact distal pulses.  No murmur heard. Pulses:      Radial pulses are 2+ on the  right side, and 2+ on the left side.  Pulmonary/Chest: Effort normal and breath sounds normal. No respiratory distress. He has no wheezes. He has no rales.  Abdominal: Soft. Bowel sounds are normal. He exhibits no distension and no mass. There is no tenderness. There is no rebound and no guarding.  Genitourinary: Rectum normal and prostate normal. Rectal exam shows no external hemorrhoid, no internal hemorrhoid, no fissure, no mass, no tenderness and anal tone normal. Prostate is not enlarged (15gm) and not tender.  Musculoskeletal: Normal range of motion. He exhibits no edema.  Lymphadenopathy:    He has no cervical adenopathy.  Neurological: He is alert and oriented to person, place, and time.  CN grossly intact, station and gait intact Recall 3/3 Calculation 5/5 serial 3  Skin: Skin is warm and dry. No rash noted.  Psychiatric: He has a normal mood and affect. His behavior is normal. Judgment and thought content normal.  Nursing note and vitals reviewed.  Results for orders placed or performed in visit on 10/15/15  Uric acid  Result Value Ref Range   Uric Acid, Serum 6.3 4.0 - 7.8 mg/dL  Hepatitis C antibody  Result Value Ref Range   HCV Ab NEGATIVE NEGATIVE  Lipid panel  Result Value Ref Range   Cholesterol 145 0 - 200 mg/dL   Triglycerides 184.0 (H) 0.0 - 149.0 mg/dL   HDL 34.40 (L) >39.00 mg/dL   VLDL 36.8 0.0 - 40.0 mg/dL   LDL Cholesterol 74 0 - 99 mg/dL   Total CHOL/HDL Ratio 4    NonHDL 110.63   PSA, Medicare  Result Value Ref Range   PSA 1.59 0.10 - 4.00 ng/ml      Assessment & Plan:   Problem List Items Addressed This Visit    Acute respiratory infection    Resolving.       Advanced care planning/counseling discussion    Advanced directive discussion: working with estate planner to set this up. Would like wife to be HCPOA. Doesn't want prolonged life support. Asked to bring me a copy.       Dyslipidemia    Update FLP. Not on medication. The 10-year ASCVD  risk score Mikey Bussing DC Brooke Bonito., et al., 2013) is: 21.4%   Values used to calculate the score:     Age: 86 years     Sex: Male     Is Non-Hispanic African American: No     Diabetic: No     Tobacco smoker: No     Systolic Blood Pressure: 782 mmHg     Is BP treated: No     HDL Cholesterol: 34.4 mg/dL     Total Cholesterol: 145 mg/dL       Relevant Orders   Lipid panel   TSH   Basic metabolic panel   Emphysema of lung (  [place])    By CT. Asxs, not on medication. 2nd pneumovax today.       Ex-smoker    Agrees to referral for lung cancer screening.       Relevant Orders   Ambulatory Referral for Lung Cancer Scre   GERD (gastroesophageal reflux disease)    Chronic, stable on zegerid.       Medicare annual wellness visit, subsequent - Primary    I have personally reviewed the Medicare Annual Wellness questionnaire and have noted 1. The patient's medical and social history 2. Their use of alcohol, tobacco or illicit drugs 3. Their current medications and supplements 4. The patient's functional ability including ADL's, fall risks, home safety risks and hearing or visual impairment. Cognitive function has been assessed and addressed as indicated.  5. Diet and physical activity 6. Evidence for depression or mood disorders The patients weight, height, BMI have been recorded in the chart. I have made referrals, counseling and provided education to the patient based on review of the above and I have provided the pt with a written personalized care plan for preventive services. Provider list updated.. See scanned questionairre as needed for further documentation. Reviewed preventative protocols and updated unless pt declined.       Obesity, Class I, BMI 30-34.9    Discussed healthy diet and lifestyle changes to affect sustainable weight loss. Encouraged exercise regimen.       Relevant Orders   Lipid panel   TSH   Basic metabolic panel   Special screening for malignant neoplasms, colon     Never returned GI calls to schedule colonoscopy - we have referred again. He agrees to go.       Relevant Orders   Ambulatory referral to Gastroenterology    Other Visit Diagnoses    Special screening for malignant neoplasm of prostate       Relevant Orders   PSA, Medicare       Follow up plan: Return in about 1 year (around 04/21/2018) for follow up visit, medicare wellness visit.  Ria Bush, MD

## 2017-04-21 NOTE — Assessment & Plan Note (Signed)
Agrees to referral for lung cancer screening.

## 2017-04-21 NOTE — Assessment & Plan Note (Signed)
Resolving

## 2017-04-21 NOTE — Addendum Note (Signed)
Addended by: Brenton Grills on: 03/19/9676 93:81 PM   Modules accepted: Orders

## 2017-04-21 NOTE — Assessment & Plan Note (Signed)
Discussed healthy diet and lifestyle changes to affect sustainable weight loss. Encouraged exercise regimen.

## 2017-04-21 NOTE — Progress Notes (Signed)
Chest c 

## 2017-04-24 ENCOUNTER — Encounter: Payer: Self-pay | Admitting: Internal Medicine

## 2017-04-28 ENCOUNTER — Encounter: Payer: Self-pay | Admitting: Acute Care

## 2017-04-28 ENCOUNTER — Ambulatory Visit (INDEPENDENT_AMBULATORY_CARE_PROVIDER_SITE_OTHER): Payer: Medicare Other | Admitting: Acute Care

## 2017-04-28 ENCOUNTER — Ambulatory Visit: Admission: RE | Admit: 2017-04-28 | Payer: Medicare Other | Source: Ambulatory Visit

## 2017-04-28 DIAGNOSIS — Z87891 Personal history of nicotine dependence: Secondary | ICD-10-CM

## 2017-04-28 NOTE — Progress Notes (Signed)
Shared Decision Making Visit Lung Cancer Screening Program [id number])   Eligibility:  Age 72 y.o.  Pack Years Smoking History Calculation 80 pack year smoking history (# packs/per year x # years smoked)  Recent History of coughing up blood  No   Unexplained weight loss? no ( >Than 15 pounds within the last 6 months )  Prior History Lung / other cancer no (Diagnosis within the last 5 years already requiring surveillance chest CT Scans).  Smoking Status Former Smoker  Former Smokers: Years since quit: 11 years  Quit Date: 1970  Visit Components:  Discussion included one or more decision making aids. yes  Discussion included risk/benefits of screening. yes  Discussion included potential follow up diagnostic testing for abnormal scans. yes  Discussion included meaning and risk of over diagnosis. yes  Discussion included meaning and risk of False Positives. yes  Discussion included meaning of total radiation exposure. yes  Counseling Included:  Importance of adherence to annual lung cancer LDCT screening. yes  Impact of comorbidities on ability to participate in the program. yes  Ability and willingness to under diagnostic treatment. yes  Smoking Cessation Counseling:  Current Smokers:   Discussed importance of smoking cessation. NA  Information about tobacco cessation classes and interventions provided to patient. yes  Patient provided with "ticket" for LDCT Scan. yes  Symptomatic Patient. no  Counseling  Diagnosis Code: Tobacco Use Z72.0  Asymptomatic Patient yes  Counseling (Intermediate counseling: > three minutes counseling) V2536  Former Smokers:   Discussed the importance of maintaining cigarette abstinence. yes  Diagnosis Code: Personal History of Nicotine Dependence. U44.034  Information about tobacco cessation classes and interventions provided to patient. Yes  Patient provided with "ticket" for LDCT Scan. yes  Written Order for Lung Cancer  Screening with LDCT placed in Epic. Yes (CT Chest Lung Cancer Screening Low Dose W/O CM) [id number] Z12.2-Screening of respiratory organs Z87.891-Personal history of nicotine dependence  I spent 25 minutes of face to face time with Mr. Eichinger discussing the risks and benefits of lung cancer screening. We viewed a power point together that explained in detail the above noted topics. We took the time to pause the power point at intervals to allow for questions to be asked and answered to ensure understanding. We discussed that he had taken the single most powerful action possible to decrease his risk of developing lung cancer when he quit smoking. I counseled him to remain smoke free, and to contact me if he ever had the desire to smoke again so that I can provide resources and tools to help support the effort to remain smoke free. We discussed the time and location of the scan, and that either  Doroteo Glassman RN or I will call with the results within  24-48 hours of receiving them. He has my card and contact information in the event he needs to speak with me, in addition to a copy of the power point we reviewed as a resource. He verbalized understanding of all of the above and had no further questions upon leaving the office.     I explained to the patient that there has been a high incidence of coronary artery disease noted on these exams. I explained that this is a non-gated exam therefore degree or severity cannot be determined. This patient is not on statin therapy. I have asked the patient to follow-up with their PCP regarding any incidental finding of coronary artery disease and management with diet or medication as they feel is  clinically indicated. The patient verbalized understanding of the above and had no further questions.     Magdalen Spatz, NP 04/28/2017

## 2017-05-05 ENCOUNTER — Ambulatory Visit (INDEPENDENT_AMBULATORY_CARE_PROVIDER_SITE_OTHER)
Admission: RE | Admit: 2017-05-05 | Discharge: 2017-05-05 | Disposition: A | Discharge status: Home / Self Care | Payer: Medicare Other | Source: Ambulatory Visit | Attending: Acute Care | Admitting: Acute Care

## 2017-05-05 DIAGNOSIS — Z87891 Personal history of nicotine dependence: Secondary | ICD-10-CM

## 2017-05-05 DIAGNOSIS — Z122 Encounter for screening for malignant neoplasm of respiratory organs: Secondary | ICD-10-CM

## 2017-05-10 ENCOUNTER — Other Ambulatory Visit: Payer: Self-pay | Admitting: Acute Care

## 2017-05-10 DIAGNOSIS — Z122 Encounter for screening for malignant neoplasm of respiratory organs: Secondary | ICD-10-CM

## 2017-05-10 DIAGNOSIS — Z87891 Personal history of nicotine dependence: Secondary | ICD-10-CM

## 2017-05-13 ENCOUNTER — Encounter: Payer: Self-pay | Admitting: Family Medicine

## 2017-05-13 DIAGNOSIS — K760 Fatty (change of) liver, not elsewhere classified: Secondary | ICD-10-CM | POA: Insufficient documentation

## 2017-05-13 DIAGNOSIS — I700 Atherosclerosis of aorta: Secondary | ICD-10-CM | POA: Insufficient documentation

## 2017-07-07 ENCOUNTER — Ambulatory Visit (AMBULATORY_SURGERY_CENTER): Payer: Self-pay

## 2017-07-07 ENCOUNTER — Other Ambulatory Visit: Payer: Self-pay

## 2017-07-07 VITALS — Ht 69.0 in | Wt 227.0 lb

## 2017-07-07 DIAGNOSIS — Z860100 Personal history of colon polyps, unspecified: Secondary | ICD-10-CM

## 2017-07-07 MED ORDER — NA SULFATE-K SULFATE-MG SULF 17.5-3.13-1.6 GM/177ML PO SOLN: 1.0000 | Freq: Once | ORAL | 0 refills | Status: AC

## 2017-07-07 NOTE — Progress Notes (Signed)
Denies allergies to eggs or soy products. Denies complication of anesthesia or sedation. Denies use of weight loss medication. Denies use of O2.   Emmi instructions declined.  

## 2017-07-12 HISTORY — PX: COLONOSCOPY: SHX174

## 2017-07-14 DIAGNOSIS — H52203 Unspecified astigmatism, bilateral: Secondary | ICD-10-CM | POA: Diagnosis not present

## 2017-07-14 DIAGNOSIS — H2513 Age-related nuclear cataract, bilateral: Secondary | ICD-10-CM | POA: Diagnosis not present

## 2017-07-21 ENCOUNTER — Ambulatory Visit (AMBULATORY_SURGERY_CENTER): Payer: Medicare Other | Admitting: Internal Medicine

## 2017-07-21 ENCOUNTER — Other Ambulatory Visit: Payer: Self-pay

## 2017-07-21 ENCOUNTER — Encounter: Payer: Self-pay | Admitting: Internal Medicine

## 2017-07-21 VITALS — BP 134/69 | HR 54 | Temp 99.1°F | Resp 11 | Ht 69.0 in | Wt 227.0 lb

## 2017-07-21 DIAGNOSIS — K635 Polyp of colon: Secondary | ICD-10-CM | POA: Diagnosis not present

## 2017-07-21 DIAGNOSIS — D125 Benign neoplasm of sigmoid colon: Secondary | ICD-10-CM

## 2017-07-21 DIAGNOSIS — Z1211 Encounter for screening for malignant neoplasm of colon: Secondary | ICD-10-CM

## 2017-07-21 DIAGNOSIS — K219 Gastro-esophageal reflux disease without esophagitis: Secondary | ICD-10-CM | POA: Diagnosis not present

## 2017-07-21 DIAGNOSIS — D123 Benign neoplasm of transverse colon: Secondary | ICD-10-CM

## 2017-07-21 DIAGNOSIS — D122 Benign neoplasm of ascending colon: Secondary | ICD-10-CM

## 2017-07-21 DIAGNOSIS — Z86010 Personal history of colonic polyps: Secondary | ICD-10-CM | POA: Diagnosis not present

## 2017-07-21 DIAGNOSIS — D124 Benign neoplasm of descending colon: Secondary | ICD-10-CM | POA: Diagnosis not present

## 2017-07-21 DIAGNOSIS — J449 Chronic obstructive pulmonary disease, unspecified: Secondary | ICD-10-CM | POA: Diagnosis not present

## 2017-07-21 MED ORDER — SODIUM CHLORIDE 0.9 % IV SOLN: 500.0000 mL | Freq: Once | INTRAVENOUS | Status: DC

## 2017-07-21 NOTE — Progress Notes (Signed)
Report to PACU, RN, vss, BBS= Clear.  

## 2017-07-21 NOTE — Op Note (Signed)
[facility] Patient Name: Ryan Mathews Procedure Date: 07/21/2017 10:47 AM MRN: [id number] Endoscopist: Jerene Bears , MD Age: 72 Referring MD:  Date of Birth: [birth year] Gender: Male Account #: [id number] Procedure:                Colonoscopy Indications:              Screening for colorectal malignant neoplasm, This                            is the patient's first colonoscopy Medicines:                Monitored Anesthesia Care Procedure:                Pre-Anesthesia Assessment:                           - Prior to the procedure, a History and Physical                            was performed, and patient medications and                            allergies were reviewed. The patient's tolerance of                            previous anesthesia was also reviewed. The risks                            and benefits of the procedure and the sedation                            options and risks were discussed with the patient.                            All questions were answered, and informed consent                            was obtained. Prior Anticoagulants: The patient has                            taken no previous anticoagulant or antiplatelet                            agents. ASA Grade Assessment: II - A patient with                            mild systemic disease. After reviewing the risks                            and benefits, the patient was deemed in                            satisfactory condition to undergo the procedure.  After obtaining informed consent, the colonoscope                            was passed under direct vision. Throughout the                            procedure, the patient's blood pressure, pulse, and                            oxygen saturations were monitored continuously. The                            Model CF-HQ190L [id number]) scope was introduced                            through the anus and  advanced to the the cecum,                            identified by appendiceal orifice and ileocecal                            valve. The colonoscopy was performed without                            difficulty. The patient tolerated the procedure                            well. The quality of the bowel preparation was                            good. The ileocecal valve, appendiceal orifice, and                            rectum were photographed. Scope In: 10:55:36 AM Scope Out: 11:18:41 AM Scope Withdrawal Time: 0 hours 21 minutes 19 seconds  Total Procedure Duration: 0 hours 23 minutes 5 seconds  Findings:                 The digital rectal exam was normal.                           A 5 mm polyp was found in the ascending colon. The                            polyp was sessile. The polyp was removed with a                            cold snare. Resection and retrieval were complete.                           Three sessile polyps were found in the transverse                            colon. The polyps were 4 to  7 mm in size. These                            polyps were removed with a cold snare. Resection                            and retrieval were complete.                           Three sessile polyps were found in the descending                            colon. The polyps were 3 to 5 mm in size. These                            polyps were removed with a cold snare. Resection                            and retrieval were complete.                           A 6 mm polyp was found in the sigmoid colon. The                            polyp was sessile. The polyp was removed with a                            cold snare. Resection and retrieval were complete.                           Multiple small and large-mouthed diverticula were                            found in the sigmoid colon and descending colon.                           The retroflexed view of the distal rectum and  anal                            verge was normal and showed no anal or rectal                            abnormalities. Complications:            No immediate complications. Estimated Blood Loss:     Estimated blood loss was minimal. Impression:               - One 5 mm polyp in the ascending colon, removed                            with a cold snare. Resected and retrieved.                           - Three 4 to 7  mm polyps in the transverse colon,                            removed with a cold snare. Resected and retrieved.                           - Three 3 to 5 mm polyps in the descending colon,                            removed with a cold snare. Resected and retrieved.                           - One 6 mm polyp in the sigmoid colon, removed with                            a cold snare. Resected and retrieved.                           - Moderate diverticulosis in the sigmoid colon and                            in the descending colon.                           - The distal rectum and anal verge are normal on                            retroflexion view. Recommendation:           - Patient has a contact number available for                            emergencies. The signs and symptoms of potential                            delayed complications were discussed with the                            patient. Return to normal activities tomorrow.                            Written discharge instructions were provided to the                            patient.                           - Resume previous diet.                           - Continue present medications.                           - Await pathology results.                           -  Repeat colonoscopy is recommended for                            surveillance. The colonoscopy date will be                            determined after pathology results from today's                            exam become available for  review. Jerene Bears, MD 07/21/2017 11:23:16 AM This report has been signed electronically.

## 2017-07-21 NOTE — Progress Notes (Signed)
Pt's states no medical or surgical changes since previsit or office visit. 

## 2017-07-21 NOTE — Progress Notes (Signed)
Called to room to assist during endoscopic procedure.  Patient ID and intended procedure confirmed with present staff. Received instructions for my participation in the procedure from the performing physician.  

## 2017-07-21 NOTE — Patient Instructions (Addendum)
Thank you for allowing [place] to care for you today!  Await pathology results.  Handout given for Polyps.     YOU HAD AN ENDOSCOPIC PROCEDURE TODAY AT [facility]:   Refer to the procedure report that was given to you for any specific questions about what was found during the examination.  If the procedure report does not answer your questions, please call your gastroenterologist to clarify.  If you requested that your care partner not be given the details of your procedure findings, then the procedure report has been included in a sealed envelope for you to review at your convenience later.  YOU SHOULD EXPECT: Some feelings of bloating in the abdomen. Passage of more gas than usual.  Walking can help get rid of the air that was put into your GI tract during the procedure and reduce the bloating. If you had a lower endoscopy (such as a colonoscopy or flexible sigmoidoscopy) you may notice spotting of blood in your stool or on the toilet paper. If you underwent a bowel prep for your procedure, you may not have a normal bowel movement for a few days.  Please Note:  You might notice some irritation and congestion in your nose or some drainage.  This is from the oxygen used during your procedure.  There is no need for concern and it should clear up in a day or so.  SYMPTOMS TO REPORT IMMEDIATELY:   Following lower endoscopy (colonoscopy or flexible sigmoidoscopy):  Excessive amounts of blood in the stool  Significant tenderness or worsening of abdominal pains  Swelling of the abdomen that is new, acute  Fever of 100F or higher   For urgent or emergent issues, a gastroenterologist can be reached at any hour by calling [phone].   DIET:  We do recommend a small meal at first, but then you may proceed to your regular diet.  Drink plenty of fluids but you should avoid alcoholic beverages for 24 hours.  ACTIVITY:  You should plan to take it easy for the rest of today and you  should NOT DRIVE or use heavy machinery until tomorrow (because of the sedation medicines used during the test).    FOLLOW UP: Our staff will call the number listed on your records the next business day following your procedure to check on you and address any questions or concerns that you may have regarding the information given to you following your procedure. If we do not reach you, we will leave a message.  However, if you are feeling well and you are not experiencing any problems, there is no need to return our call.  We will assume that you have returned to your regular daily activities without incident.  If any biopsies were taken you will be contacted by phone or by letter within the next 1-3 weeks.  Please call us at [phone] if you have not heard about the biopsies in 3 weeks.    SIGNATURES/CONFIDENTIALITY: You and/or your care partner have signed paperwork which will be entered into your electronic medical record.  These signatures attest to the fact that that the information above on your After Visit Summary has been reviewed and is understood.  Full responsibility of the confidentiality of this discharge information lies with you and/or your care-partner.

## 2017-07-24 ENCOUNTER — Telehealth: Payer: Self-pay

## 2017-07-24 ENCOUNTER — Telehealth: Payer: Self-pay | Admitting: *Deleted

## 2017-07-24 NOTE — Telephone Encounter (Signed)
  Follow up Call-  Call back number 07/21/2017  Post procedure Call Back phone  # [phone]  Permission to leave phone message Yes  Some recent data might be hidden     Patient questions:  Do you have a fever, pain , or abdominal swelling? No. Pain Score  0 *  Have you tolerated food without any problems? Yes.    Have you been able to return to your normal activities? Yes.    Do you have any questions about your discharge instructions: Diet   No. Medications  No. Follow up visit  No.  Do you have questions or concerns about your Care? No.  Actions: * If pain score is 4 or above: No action needed, pain <4.

## 2017-07-24 NOTE — Telephone Encounter (Signed)
  Follow up Call-  Call back number 07/21/2017  Post procedure Call Back phone  # [phone]  Permission to leave phone message Yes  Some recent data might be hidden     Patient questions:  Message left to call us if necessary.

## 2017-07-25 ENCOUNTER — Encounter: Payer: Self-pay | Admitting: Internal Medicine

## 2017-07-31 ENCOUNTER — Encounter: Payer: Self-pay | Admitting: Family Medicine

## 2017-11-03 ENCOUNTER — Ambulatory Visit (INDEPENDENT_AMBULATORY_CARE_PROVIDER_SITE_OTHER)
Admission: RE | Admit: 2017-11-03 | Discharge: 2017-11-03 | Disposition: A | Discharge status: Home / Self Care | Payer: Medicare Other | Source: Ambulatory Visit | Attending: Acute Care | Admitting: Acute Care

## 2017-11-03 DIAGNOSIS — Z87891 Personal history of nicotine dependence: Secondary | ICD-10-CM

## 2017-11-03 DIAGNOSIS — Z122 Encounter for screening for malignant neoplasm of respiratory organs: Secondary | ICD-10-CM | POA: Diagnosis not present

## 2017-11-03 DIAGNOSIS — R918 Other nonspecific abnormal finding of lung field: Secondary | ICD-10-CM | POA: Diagnosis not present

## 2017-11-03 DIAGNOSIS — J439 Emphysema, unspecified: Secondary | ICD-10-CM | POA: Diagnosis not present

## 2017-11-14 ENCOUNTER — Other Ambulatory Visit: Payer: Self-pay | Admitting: Acute Care

## 2017-11-14 DIAGNOSIS — Z122 Encounter for screening for malignant neoplasm of respiratory organs: Secondary | ICD-10-CM

## 2017-11-14 DIAGNOSIS — Z87891 Personal history of nicotine dependence: Secondary | ICD-10-CM

## 2018-01-25 ENCOUNTER — Ambulatory Visit (INDEPENDENT_AMBULATORY_CARE_PROVIDER_SITE_OTHER): Payer: Medicare Other

## 2018-01-25 DIAGNOSIS — Z23 Encounter for immunization: Secondary | ICD-10-CM

## 2018-01-25 NOTE — Progress Notes (Signed)
Patient in office today for annual flu vaccine. Tolerated administration of vaccine well in left deltoid. VIS given as directed.

## 2018-02-19 ENCOUNTER — Other Ambulatory Visit: Payer: Self-pay

## 2018-02-19 ENCOUNTER — Encounter (HOSPITAL_COMMUNITY): Payer: Self-pay | Admitting: Emergency Medicine

## 2018-02-19 ENCOUNTER — Ambulatory Visit (HOSPITAL_COMMUNITY)
Admission: EM | Admit: 2018-02-19 | Discharge: 2018-02-19 | Disposition: A | Discharge status: Home / Self Care | Payer: Medicare Other | Attending: Emergency Medicine | Admitting: Emergency Medicine

## 2018-02-19 ENCOUNTER — Telehealth: Payer: Self-pay

## 2018-02-19 DIAGNOSIS — L03012 Cellulitis of left finger: Secondary | ICD-10-CM

## 2018-02-19 DIAGNOSIS — R2232 Localized swelling, mass and lump, left upper limb: Secondary | ICD-10-CM | POA: Diagnosis not present

## 2018-02-19 MED ORDER — CEPHALEXIN 500 MG PO CAPS: 500.0000 mg | ORAL_CAPSULE | Freq: Four times a day (QID) | ORAL | 0 refills | Status: DC

## 2018-02-19 NOTE — Telephone Encounter (Signed)
Noted. Agree.

## 2018-02-19 NOTE — ED Notes (Signed)
Notified melanie, np and theresa cma of patient

## 2018-02-19 NOTE — Discharge Instructions (Addendum)
As we discussed to treat with antibiotics for 24 hours anf monitor area. IF symptoms become worse you will need to see your pcp , specialist or go to the ER. May use warm compresses to hand.  Circle area to monitor  Take motrin as needed for pain and swelling

## 2018-02-19 NOTE — Telephone Encounter (Signed)
Pt was working outside over weekend and was wearing gloves but today pts lt hand swollen and red from bottom of fingers at knuckles to wrist. No red streaks seen but pt cannot use thumb and pt cannot pick up or hold anything in lt hand. No difficulty breathing and no swelling in mouth, tongue or throat. Pt is not sure if insect bite or what the cause is. No available appt at [facility]. Dr Danise Mina advised pt should be seen today. Pt said he will go to [facility] now. FYI to Dr Danise Mina.

## 2018-02-19 NOTE — ED Triage Notes (Signed)
Left hand swelling and redness.  Recently doing Architect type work.  No known injury.  Patient has a band on left ring finger

## 2018-02-19 NOTE — ED Provider Notes (Signed)
[place]    CSN: [id number] Arrival date & time: 02/19/18  1631     History   Chief Complaint Chief Complaint  Patient presents with  . Hand Problem    HPI Ryan Mathews is a 72 y.o. male.   Pt was helping son with tile 2 days ago. He does not remember hitting his LT hand but did start to notice swelling to LT thumb are then yesterday and today noticed increase of swelling with redness going up his FA. Pt has not taken anything . States that he does have arthritis to this area. Had the same sx to rt hand years ago. Did not have to have tx. States that he does have minimal pain. Able to move all digits. No fever. No n/v/d. Was able to remove yellow color wedding band to LLF.      Past Medical History:  Diagnosis Date  . Bilateral inguinal hernia (BIH) s/p lap repair 09/06/2012 08/09/2012  . Cataract   . CMC arthritis, thumb, degenerative    right  . COPD (chronic obstructive pulmonary disease) ([place])   . Emphysema of lung ([place])   . Ex-smoker   . GERD (gastroesophageal reflux disease)   . History of chicken pox   . History of pneumonia 08/06/2009       . Nasal septal deviation 04/14/2017   Marked  . Numbness    R cheek stays numb  . Obesity (BMI 30-39.9) 08/09/2012    Patient Active Problem List   Diagnosis Date Noted  . Thoracic aorta atherosclerosis ([place]) 05/13/2017  . Hepatic steatosis 05/13/2017  . Medicare annual wellness visit, subsequent 04/21/2017  . Advanced care planning/counseling discussion 04/21/2017  . Dyslipidemia 04/21/2017  . Acute respiratory infection 04/14/2017  . Nasal septal deviation 04/14/2017  . Chest pain 08/03/2016  . Vitreous floaters of left eye 12/22/2015  . Special screening for malignant neoplasms, colon 11/21/2015  . Osteoarthritis of [place] joint of thumb 09/17/2015  . Chronic leg pain 08/07/2015  . Ex-smoker   . GERD (gastroesophageal reflux disease)   . Obesity, Class I, BMI 30-34.9 08/09/2012  . Diastasis recti  08/09/2012  . Bullous emphysema ([place]) 08/05/2009    Past Surgical History:  Procedure Laterality Date  . ANKLE SURGERY Left 1966   with tendon repair with stainless steel wire  . COLONOSCOPY  07/2017   multiple tubular adenomas, rpt 3 yrs (Pyrtle)  . INSERTION OF MESH Bilateral 09/06/2012   Procedure: INSERTION OF MESH;  Surgeon: Adin Hector, MD  . LAPAROSCOPIC INGUINAL HERNIA WITH UMBILICAL HERNIA Bilateral 09/06/2012   Procedure: LAPAROSCOPIC exploration and repair of hernias in bellybutton and bilateral groins with mesh;  Surgeon: Adin Hector, MD       Home Medications    Prior to Admission medications   Medication Sig Start Date End Date Taking? Authorizing Provider  b complex vitamins tablet Take 1 tablet by mouth daily.   Yes [provider]  ibuprofen (ADVIL,MOTRIN) 600 MG tablet Take 600 mg by mouth every 6 (six) hours as needed.   Yes [provider]  omeprazole-sodium bicarbonate (ZEGERID) 40-1100 MG per capsule Take 1 capsule by mouth daily as needed.   Yes [provider]  vitamin C (ASCORBIC ACID) 500 MG tablet Take 500 mg by mouth daily.   Yes [provider]  cephALEXin (KEFLEX) 500 MG capsule Take 1 capsule (500 mg total) by mouth 4 (four) times daily. 02/19/18   Marney Setting, NP  Family History Family History  Problem Relation Age of Onset  . Cancer Mother        breast  . Cancer Sister        lung (minimal smoker)  . Colon cancer Neg Hx   . Esophageal cancer Neg Hx   . Liver cancer Neg Hx   . Rectal cancer Neg Hx   . Stomach cancer Neg Hx   . Pancreatic cancer Neg Hx     Social History Social History   Tobacco Use  . Smoking status: Former Smoker    Packs/day: 2.00    Years: 40.00    Pack years: 80.00    Types: Cigarettes    Start date: 03/14/1968    Last attempt to quit: 03/14/2006    Years since quitting: 11.9  . Smokeless tobacco: Never Used  Substance Use Topics  . Alcohol use: Yes     Comment: Occasionally  . Drug use: No     Allergies   Patient has no known allergies.   Review of Systems Review of Systems  Constitutional: Negative.   Eyes: Negative.   Respiratory: Negative.   Cardiovascular: Negative.   Gastrointestinal: Negative.   Genitourinary: Negative.   Musculoskeletal: Positive for joint swelling.  Skin: Positive for rash.       Swelling and redness to LT hand, LT thumb and up to LT wirst FA area ,   Neurological: Negative.      Physical Exam Triage Vital Signs ED Triage Vitals  Enc Vitals Group     BP 02/19/18 1752 (!) 166/66     Pulse Rate 02/19/18 1752 67     Resp 02/19/18 1752 18     Temp 02/19/18 1752 97.6 F (36.4 C)     Temp Source 02/19/18 1752 Oral     SpO2 02/19/18 1752 96 %     Weight --      Height --      Head Circumference --      Peak Flow --      Pain Score 02/19/18 1750 2     Pain Loc --      Pain Edu? --      Excl. in [place]? --    No data found.  Updated Vital Signs BP (!) 166/66 (BP Location: Right Arm) Comment (BP Location): large cuff  Pulse 67   Temp 97.6 F (36.4 C) (Oral)   Resp 18   SpO2 96%   Visual Acuity     Physical Exam  Constitutional: He appears well-developed.  Cardiovascular: Normal rate.  Pulmonary/Chest: Effort normal and breath sounds normal.  Abdominal: Soft.  Musculoskeletal: He exhibits edema and tenderness.  ROM to extermity with decrease to making a fist. +2 edema   Skin: Capillary refill takes less than 2 seconds. Rash noted. There is erythema.  Strong pulses, erythema noted to LT thumb and to LT wrist fa area. +2 edema, able to flex and extend.      UC Treatments / Results  Labs (all labs ordered are listed, but only abnormal results are displayed) Labs Reviewed - No data to display  EKG None  Radiology No results found.  Procedures Procedures (including critical care time)  Medications Ordered in UC Medications - No data to display  Initial Impression /  Assessment and Plan / UC Course  I have reviewed the triage vital signs and the nursing notes.  Pertinent labs & imaging results that were available during my care of the patient were reviewed  by me and considered in my medical decision making (see chart for details).     Pt would like to try abx tx prior to open and draining area . Expressed that if the abx does not help in 24 hrs and the symptoms become worse he will need to see pcp or specialist or go to the ER.  Final Clinical Impressions(s) / UC Diagnoses   Final diagnoses:  Cellulitis of finger of left hand     Discharge Instructions     As we discussed to treat with antibiotics for 24 hours anf monitor area. IF symptoms become worse you will need to see your pcp , specialist or go to the ER. May use warm compresses to hand.  Circle area to monitor  Take motrin as needed for pain and swelling      ED Prescriptions    Medication Sig Dispense Auth. Provider   cephALEXin (KEFLEX) 500 MG capsule Take 1 capsule (500 mg total) by mouth 4 (four) times daily. 20 capsule Marney Setting, NP     Controlled Substance Prescriptions [place] Controlled Substance Registry consulted? Not Applicable   Marney Setting, NP 02/19/18 [phone]

## 2018-02-20 ENCOUNTER — Ambulatory Visit: Payer: Medicare Other | Admitting: Family Medicine

## 2018-03-07 NOTE — Progress Notes (Signed)
BP 136/82 (BP Location: Right Arm, Patient Position: Sitting, Cuff Size: Large)   Pulse 63   Temp 97.6 F (36.4 C) (Oral)   Ht 5\' 8"  (1.727 m)   Wt 224 lb 12 oz (101.9 kg)   SpO2 96%   BMI 34.17 kg/m    CC: UCC f/u visit Subjective:    Patient ID: Ryan Mathews, male    DOB: 26-Nov-1945, 72 y.o.   MRN: [id number]  HPI: Ryan Mathews is a 72 y.o. male presenting on 03/08/2018 for Hospitalization Follow-up (Seen at [facility] ED on 02/19/18. C/o still having pain and swelling in left hand at base of thumb. States he is not able to grip anything, including a piece of paper.  Also, has occasionally some tingling radiating up the arm around to posterior neck. Says he was helping redo his son's kitchen on 12/6- 02/17/18. Had pain/swelling in left hand on 12/8. Then seen at ER on 02/19/18 due to worsening sxs. )   R handed. DOI: 02/16/2018 - demo'd kitchen with son. He was using hammer and chisel to break tile off countertop - 12 hour day. Never injured hand - no cuts to skin. Sunday woke up with hand swelling, redness, pain. Seen at [place] visit 02/19/2018 for L hand redness and swelling into forearm, diagnosed with cellulitis and treated with keflex QID course with recommendation to f/u for recheck in 2 days. Records reviewed. Has been taking ibuprofen 400mg  several times daily, currently down to bid.   No fevers/chills.   He was hypertensive 166/66 at [facility].       Relevant past medical, surgical, family and social history reviewed and updated as indicated. Interim medical history since our last visit reviewed. Allergies and medications reviewed and updated. Outpatient Medications Prior to Visit  Medication Sig Dispense Refill  . b complex vitamins tablet Take 1 tablet by mouth daily.    Marland Kitchen ibuprofen (ADVIL,MOTRIN) 600 MG tablet Take 600 mg by mouth every 6 (six) hours as needed.    Marland Kitchen omeprazole-sodium bicarbonate (ZEGERID) 40-1100 MG per capsule Take 1 capsule by mouth daily as needed.    .  vitamin C (ASCORBIC ACID) 500 MG tablet Take 500 mg by mouth daily.    . cephALEXin (KEFLEX) 500 MG capsule Take 1 capsule (500 mg total) by mouth 4 (four) times daily. 20 capsule 0   Facility-Administered Medications Prior to Visit  Medication Dose Route Frequency Provider Last Rate Last Dose  . 0.9 %  sodium chloride infusion  500 mL Intravenous Once Pyrtle, Lajuan Lines, MD         Per HPI unless specifically indicated in ROS section below Review of Systems Objective:    BP 136/82 (BP Location: Right Arm, Patient Position: Sitting, Cuff Size: Large)   Pulse 63   Temp 97.6 F (36.4 C) (Oral)   Ht 5\' 8"  (1.727 m)   Wt 224 lb 12 oz (101.9 kg)   SpO2 96%   BMI 34.17 kg/m   Wt Readings from Last 3 Encounters:  03/08/18 224 lb 12 oz (101.9 kg)  07/21/17 227 lb (103 kg)  07/07/17 227 lb (103 kg)    Physical Exam Vitals signs and nursing note reviewed.  Constitutional:      General: He is not in acute distress.    Appearance: Normal appearance.  Musculoskeletal: Normal range of motion.        General: Swelling and tenderness present.     Comments: R hand with some discomfort to  palpation at 1st [facility] joint L hand with marked discomfort to palpation at 1st [facility] joint, swelling at area as well. No pain with finkelstein test. No pain at anatomical snuff box. FROM at wrist.  2+ rad pulses bilaterally  Skin:    General: Skin is warm and dry.     Findings: No rash.  Neurological:     Mental Status: He is alert.  Psychiatric:        Mood and Affect: Mood normal.        Assessment & Plan:   Problem List Items Addressed This Visit    Osteoarthritis of CMC joint of thumb - Primary    Anticipate recent L thumb osteoarthritis flare - and possible treated cellulitis completed keflex course with resolution of erythema. Will Rx prednisone taper, if persistent trouble recommend referral to hand surgery for further eval/management. Pt agrees with plan.       Relevant Medications   predniSONE  (DELTASONE) 20 MG tablet   Other Relevant Orders   DG Hand Complete Left       Meds ordered this encounter  Medications  . predniSONE (DELTASONE) 20 MG tablet    Sig: Take two tablets daily for 3 days followed by one tablet daily for 4 days    Dispense:  10 tablet    Refill:  0   Orders Placed This Encounter  Procedures  . DG Hand Complete Left    Standing Status:   Future    Number of Occurrences:   1    Standing Expiration Date:   05/10/2019    Order Specific Question:   Reason for Exam (SYMPTOM  OR DIAGNOSIS REQUIRED)    Answer:   left thumb pain/swelling eval 1st CMC arthritis    Order Specific Question:   Preferred imaging location?    Answer:   [facility]    Order Specific Question:   Radiology Contrast Protocol - do NOT remove file path    Answer:   [email or web address]    Follow up plan: Return if symptoms worsen or fail to improve.  Ria Bush, MD

## 2018-03-08 ENCOUNTER — Ambulatory Visit (INDEPENDENT_AMBULATORY_CARE_PROVIDER_SITE_OTHER)
Admission: RE | Admit: 2018-03-08 | Discharge: 2018-03-08 | Disposition: A | Discharge status: Home / Self Care | Payer: Medicare Other | Source: Ambulatory Visit | Attending: Family Medicine | Admitting: Family Medicine

## 2018-03-08 ENCOUNTER — Encounter: Payer: Self-pay | Admitting: Family Medicine

## 2018-03-08 ENCOUNTER — Ambulatory Visit (INDEPENDENT_AMBULATORY_CARE_PROVIDER_SITE_OTHER): Payer: Medicare Other | Admitting: Family Medicine

## 2018-03-08 VITALS — BP 136/82 | HR 63 | Temp 97.6°F | Ht 68.0 in | Wt 224.8 lb

## 2018-03-08 DIAGNOSIS — M180 Bilateral primary osteoarthritis of first carpometacarpal joints: Secondary | ICD-10-CM

## 2018-03-08 DIAGNOSIS — S60453A Superficial foreign body of left middle finger, initial encounter: Secondary | ICD-10-CM | POA: Diagnosis not present

## 2018-03-08 MED ORDER — PREDNISONE 20 MG PO TABS
ORAL_TABLET | ORAL | 0 refills | Status: DC
Start: 2018-03-08 — End: 2018-05-04

## 2018-03-08 NOTE — Assessment & Plan Note (Signed)
Anticipate recent L thumb osteoarthritis flare - and possible treated cellulitis completed keflex course with resolution of erythema. Will Rx prednisone taper, if persistent trouble recommend referral to hand surgery for further eval/management. Pt agrees with plan.

## 2018-03-08 NOTE — Patient Instructions (Addendum)
I think you have flare of wear and tear arthritis (osteoarthritis) of thumb joint (1st CMC arthritis). Treat with prednisone taper for 1 week. If no better with this, let me know for referral to hand doctor.  xrays today.

## 2018-05-04 ENCOUNTER — Encounter: Payer: Self-pay | Admitting: Family Medicine

## 2018-05-04 ENCOUNTER — Ambulatory Visit (INDEPENDENT_AMBULATORY_CARE_PROVIDER_SITE_OTHER): Payer: Medicare Other

## 2018-05-04 ENCOUNTER — Ambulatory Visit (INDEPENDENT_AMBULATORY_CARE_PROVIDER_SITE_OTHER): Payer: Medicare Other | Admitting: Family Medicine

## 2018-05-04 VITALS — BP 146/84 | HR 65 | Temp 97.8°F | Ht 68.0 in | Wt 217.3 lb

## 2018-05-04 DIAGNOSIS — Z0000 Encounter for general adult medical examination without abnormal findings: Secondary | ICD-10-CM

## 2018-05-04 DIAGNOSIS — I700 Atherosclerosis of aorta: Secondary | ICD-10-CM | POA: Diagnosis not present

## 2018-05-04 DIAGNOSIS — K760 Fatty (change of) liver, not elsewhere classified: Secondary | ICD-10-CM

## 2018-05-04 DIAGNOSIS — J439 Emphysema, unspecified: Secondary | ICD-10-CM | POA: Diagnosis not present

## 2018-05-04 DIAGNOSIS — E785 Hyperlipidemia, unspecified: Secondary | ICD-10-CM

## 2018-05-04 DIAGNOSIS — K219 Gastro-esophageal reflux disease without esophagitis: Secondary | ICD-10-CM | POA: Diagnosis not present

## 2018-05-04 DIAGNOSIS — E669 Obesity, unspecified: Secondary | ICD-10-CM | POA: Diagnosis not present

## 2018-05-04 DIAGNOSIS — Z125 Encounter for screening for malignant neoplasm of prostate: Secondary | ICD-10-CM | POA: Diagnosis not present

## 2018-05-04 DIAGNOSIS — B353 Tinea pedis: Secondary | ICD-10-CM | POA: Diagnosis not present

## 2018-05-04 LAB — COMPREHENSIVE METABOLIC PANEL
ALT: 22 U/L (ref 0–53)
AST: 20 U/L (ref 0–37)
Albumin: 4.5 g/dL (ref 3.5–5.2)
Alkaline Phosphatase: 80 U/L (ref 39–117)
BUN: 21 mg/dL (ref 6–23)
CO2: 28 mEq/L (ref 19–32)
CREATININE: 1.06 mg/dL (ref 0.40–1.50)
Calcium: 9.7 mg/dL (ref 8.4–10.5)
Chloride: 103 mEq/L (ref 96–112)
GFR: 68.85 mL/min (ref >60.00)
Glucose, Bld: 92 mg/dL (ref 70–99)
Potassium: 4.8 mEq/L (ref 3.5–5.1)
Sodium: 140 mEq/L (ref 135–145)
Total Bilirubin: 0.5 mg/dL (ref 0.2–1.2)
Total Protein: 7.2 g/dL (ref 6.0–8.3)

## 2018-05-04 LAB — TSH: TSH: 3.37 u[IU]/mL (ref 0.35–4.50)

## 2018-05-04 LAB — LIPID PANEL
CHOL/HDL RATIO: 4
Cholesterol: 132 mg/dL (ref 0–200)
HDL: 35.1 mg/dL — ABNORMAL LOW (ref >39.00)
LDL CALC: 67 mg/dL (ref 0–99)
NonHDL: 96.45
Triglycerides: 147 mg/dL (ref 0.0–149.0)
VLDL: 29.4 mg/dL (ref 0.0–40.0)

## 2018-05-04 LAB — PSA, MEDICARE: PSA: 0.89 ng/ml (ref 0.10–4.00)

## 2018-05-04 MED ORDER — OMEPRAZOLE-SODIUM BICARBONATE 20-1100 MG PO CAPS: 1.0000 | ORAL_CAPSULE | Freq: Every day | ORAL | 0 refills | Status: DC

## 2018-05-04 NOTE — Progress Notes (Signed)
Subjective:   Ryan Mathews is a 73 y.o. male who presents for Medicare Annual/Subsequent preventive examination.  Review of Systems:  N/A Cardiac Risk Factors include: advanced age (>75men, >70 women);male gender;obesity (BMI >30kg/m2)     Objective:    Vitals: BP (!) 146/84 (BP Location: Right Arm, Patient Position: Sitting, Cuff Size: Normal)   Pulse 65   Temp 97.8 F (36.6 C) (Oral)   Ht 5\' 8"  (1.727 m) Comment: no shoes  Wt 217 lb 4.8 oz (98.6 kg)   SpO2 97%   BMI 33.04 kg/m   Body mass index is 33.04 kg/m.  Advanced Directives 05/04/2018 07/21/2017 10/15/2015 09/06/2012 09/06/2012  Does Patient Have a Medical Advance Directive? No No No Patient does not have advance directive -  Would patient like information on creating a medical advance directive? No - Patient declined No - Patient declined No - patient declined information - -  Pre-existing out of facility DNR order (yellow form or pink MOST form) - - - No No    Tobacco Social History   Tobacco Use  Smoking Status Former Smoker  . Packs/day: 2.00  . Years: 40.00  . Pack years: 80.00  . Types: Cigarettes  . Start date: 03/14/1968  . Last attempt to quit: 03/14/2006  . Years since quitting: 12.1  Smokeless Tobacco Never Used     Counseling given: No   Clinical Intake:  Pre-visit preparation completed: Yes  Pain : 0-10 Pain Score: 3  Pain Type: Acute pain Pain Location: Back Pain Orientation: Lower Pain Onset: In the past 7 days Pain Frequency: Intermittent     Nutritional Status: BMI > 30  Obese Nutritional Risks: None Diabetes: No  How often do you need to have someone help you when you read instructions, pamphlets, or other written materials from your doctor or pharmacy?: 1 - Never What is the last grade level you completed in school?: 12th grade   Interpreter Needed?: No  Comments: pt lives with spouse Information entered by :: LPinson, LPN  Past Medical History:  Diagnosis Date  .  Bilateral inguinal hernia (BIH) s/p lap repair 09/06/2012 08/09/2012  . Cataract   . CMC arthritis, thumb, degenerative    right  . COPD (chronic obstructive pulmonary disease) ([place])   . Emphysema of lung ([place])   . Ex-smoker   . GERD (gastroesophageal reflux disease)   . History of chicken pox   . History of pneumonia 08/06/2009       . Nasal septal deviation 04/14/2017   Marked  . Numbness    R cheek stays numb  . Obesity (BMI 30-39.9) 08/09/2012   Past Surgical History:  Procedure Laterality Date  . ANKLE SURGERY Left 1966   with tendon repair with stainless steel wire  . COLONOSCOPY  07/2017   multiple tubular adenomas, rpt 3 yrs (Pyrtle)  . INSERTION OF MESH Bilateral 09/06/2012   Procedure: INSERTION OF MESH;  Surgeon: Adin Hector, MD  . LAPAROSCOPIC INGUINAL HERNIA WITH UMBILICAL HERNIA Bilateral 09/06/2012   Procedure: LAPAROSCOPIC exploration and repair of hernias in bellybutton and bilateral groins with mesh;  Surgeon: Adin Hector, MD   Family History  Problem Relation Age of Onset  . Cancer Mother        breast  . Cancer Sister        lung (minimal smoker)  . Colon cancer Neg Hx   . Esophageal cancer Neg Hx   . Liver cancer Neg Hx   . Rectal  cancer Neg Hx   . Stomach cancer Neg Hx   . Pancreatic cancer Neg Hx    Social History   Socioeconomic History  . Marital status: Married    Spouse name: Not on file  . Number of children: Not on file  . Years of education: Not on file  . Highest education level: Not on file  Occupational History  . Not on file  Social Needs  . Financial resource strain: Not on file  . Food insecurity:    Worry: Not on file    Inability: Not on file  . Transportation needs:    Medical: Not on file    Non-medical: Not on file  Tobacco Use  . Smoking status: Former Smoker    Packs/day: 2.00    Years: 40.00    Pack years: 80.00    Types: Cigarettes    Start date: 03/14/1968    Last attempt to quit: 03/14/2006    Years since  quitting: 12.1  . Smokeless tobacco: Never Used  Substance and Sexual Activity  . Alcohol use: Yes    Comment: Occasionally  . Drug use: No  . Sexual activity: Yes  Lifestyle  . Physical activity:    Days per week: Not on file    Minutes per session: Not on file  . Stress: Not on file  Relationships  . Social connections:    Talks on phone: Not on file    Gets together: Not on file    Attends religious service: Not on file    Active member of club or organization: Not on file    Attends meetings of clubs or organizations: Not on file    Relationship status: Not on file  Other Topics Concern  . Not on file  Social History Narrative   Caffeine: 2 coffee, 2 tea/day   Lives with wife and 4 cats and 1 dog, grown children   Occupation: Solicitor   Edu: HS   Activity: stays active at work and in garden   Diet: some water, fruits/vegetables daily    Outpatient Encounter Medications as of 05/04/2018  Medication Sig  . b complex vitamins tablet Take 1 tablet by mouth daily.  Marland Kitchen ibuprofen (ADVIL,MOTRIN) 600 MG tablet Take 600 mg by mouth every 6 (six) hours as needed.  Marland Kitchen omeprazole-sodium bicarbonate (ZEGERID) 40-1100 MG per capsule Take 1 capsule by mouth daily as needed.  . vitamin C (ASCORBIC ACID) 500 MG tablet Take 500 mg by mouth daily.  . [DISCONTINUED] predniSONE (DELTASONE) 20 MG tablet Take two tablets daily for 3 days followed by one tablet daily for 4 days   Facility-Administered Encounter Medications as of 05/04/2018  Medication  . 0.9 %  sodium chloride infusion    Activities of Daily Living In your present state of health, do you have any difficulty performing the following activities: 05/04/2018  Hearing? N  Vision? N  Difficulty concentrating or making decisions? N  Walking or climbing stairs? N  Dressing or bathing? N  Doing errands, shopping? N  Preparing Food and eating ? N  Using the Toilet? N  In the past six months, have you accidently leaked  urine? N  Do you have problems with loss of bowel control? N  Managing your Medications? N  Managing your Finances? N  Housekeeping or managing your Housekeeping? N  Some recent data might be hidden    Patient Care Team: Ria Bush, MD as PCP - General (Family Medicine) Rigoberto Noel, MD  as Consulting Physician (Pulmonary Disease)   Assessment:   This is a routine wellness examination for [facility]   Hearing Screening   125Hz  250Hz  500Hz  1000Hz  2000Hz  3000Hz  4000Hz  6000Hz  8000Hz   Right ear:   40 40 40  0    Left ear:   40 40 40  40    Vision Screening Comments: Vision exam in 2019 with Dr. 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 Patient Stated     Starting 05/04/2018, I will continue to sleep at least 7 hours daily.        Fall Risk Fall Risk  05/04/2018 04/21/2017 10/15/2015  Falls in the past year? 0 No No  Depression Screen PHQ 2/9 Scores 05/04/2018 04/21/2017 10/15/2015  PHQ - 2 Score 0 0 0  PHQ- 9 Score 0 - -    Cognitive Function MMSE - Mini Mental State Exam 05/04/2018 10/15/2015  Orientation to time 5 5  Orientation to Place 5 5  Registration 3 3  Attention/ Calculation 0 0  Recall 1 3  Recall-comments unable to recall 2 of 3 words -  Language- name 2 objects 0 0  Language- repeat 1 1  Language- follow 3 step command 3 3  Language- read & follow direction 0 0  Write a sentence 0 0  Copy design 0 0  Total score 18 20     PLEASE NOTE: A Mini-Cog screen was completed. Maximum score is 20. A value of 0 denotes this part of Folstein MMSE was not completed or the patient failed this part of the Mini-Cog screening.   Mini-Cog Screening Orientation to Time - Max 5 pts Orientation to Place - Max 5 pts Registration - Max 3 pts Recall - Max 3 pts Language Repeat - Max 1 pts Language  Follow 3 Step Command - Max 3 pts     Immunization History  Administered Date(s) Administered  . Influenza Split 11/13/2010  . Influenza Whole 12/13/2011  . Influenza, High Dose Seasonal PF 12/22/2016, 01/25/2018  . Influenza,inj,Quad PF,6+ Mos 12/22/2015  . Pneumococcal Conjugate-13 10/15/2015  . Pneumococcal Polysaccharide-23 05/03/2011, 04/21/2017  . Zoster 01/13/2012    Screening Tests Health Maintenance  Topic Date Due  . COLONOSCOPY  07/21/2020  . DTaP/Tdap/Td (2 - Td) 03/14/2022  . TETANUS/TDAP  03/14/2022  . INFLUENZA VACCINE  Completed  . Hepatitis C Screening  Completed  . PNA vac Low Risk Adult  Completed       Plan:     I have personally reviewed, addressed, and noted the following in the patient's chart:  A. Medical and social history B. Use of alcohol, tobacco or illicit drugs  C. Current medications and supplements D. Functional ability and status E.  Nutritional status F.  Physical activity G. Advance directives H. List of other physicians I.  Hospitalizations, surgeries, and ER visits in previous 12 months J.  [place] to include hearing, vision, cognitive, depression L. Referrals and appointments - none  In addition, I have reviewed and discussed with patient certain preventive protocols, quality metrics, and best practice recommendations. A written personalized care plan for preventive services as well as general preventive health recommendations were provided to patient.  See attached scanned questionnaire for additional information.   Signed,   Lindell Noe, MHA, BS, LPN Health Coach

## 2018-05-04 NOTE — Assessment & Plan Note (Signed)
Marked by CT - did not discuss this today.

## 2018-05-04 NOTE — Assessment & Plan Note (Signed)
Trying to decrease zegerid use.

## 2018-05-04 NOTE — Patient Instructions (Signed)
Ryan Mathews , Thank you for taking time to come for your Medicare Wellness Visit. I appreciate your ongoing commitment to your health goals. Please review the following plan we discussed and let me know if I can assist you in the future.   These are the goals we discussed: Goals    . Patient Stated     Starting 05/04/2018, I will continue to sleep at least 7 hours daily.        This is a list of the screening recommended for you and due dates:  Health Maintenance  Topic Date Due  . Colon Cancer Screening  07/21/2020  . DTaP/Tdap/Td vaccine (2 - Td) 03/14/2022  . Tetanus Vaccine  03/14/2022  . Flu Shot  Completed  .  Hepatitis C: One time screening is recommended by Center for Disease Control  (CDC) for  adults born from [birth year] through 1963.   Completed  . Pneumonia vaccines  Completed   Preventive Care for Adults  A healthy lifestyle and preventive care can promote health and wellness. Preventive health guidelines for adults include the following key practices.  . A routine yearly physical is a good way to check with your health care provider about your health and preventive screening. It is a chance to share any concerns and updates on your health and to receive a thorough exam.  . Visit your dentist for a routine exam and preventive care every 6 months. Brush your teeth twice a day and floss once a day. Good oral hygiene prevents tooth decay and gum disease.  . The frequency of eye exams is based on your age, health, family medical history, use  of contact lenses, and other factors. Follow your health care provider's recommendations for frequency of eye exams.  . Eat a healthy diet. Foods like vegetables, fruits, whole grains, low-fat dairy products, and lean protein foods contain the nutrients you need without too many calories. Decrease your intake of foods high in solid fats, added sugars, and salt. Eat the right amount of calories for you. Get information about a proper diet  from your health care provider, if necessary.  . Regular physical exercise is one of the most important things you can do for your health. Most adults should get at least 150 minutes of moderate-intensity exercise (any activity that increases your heart rate and causes you to sweat) each week. In addition, most adults need muscle-strengthening exercises on 2 or more days a week.  Silver Sneakers may be a benefit available to you. To determine eligibility, you may visit the website: www.silversneakers.com or contact program at 2036869981 Mon-Fri between 8AM-8PM.   . Maintain a healthy weight. The body mass index (BMI) is a screening tool to identify possible weight problems. It provides an estimate of body fat based on height and weight. Your health care provider can find your BMI and can help you achieve or maintain a healthy weight.   For adults 20 years and older: ? A BMI below 18.5 is considered underweight. ? A BMI of 18.5 to 24.9 is normal. ? A BMI of 25 to 29.9 is considered overweight. ? A BMI of 30 and above is considered obese.   . Maintain normal blood lipids and cholesterol levels by exercising and minimizing your intake of saturated fat. Eat a balanced diet with plenty of fruit and vegetables. Blood tests for lipids and cholesterol should begin at age 42 and be repeated every 5 years. If your lipid or cholesterol levels are high, you  are over 26, or you are at high risk for heart disease, you may need your cholesterol levels checked more frequently. Ongoing high lipid and cholesterol levels should be treated with medicines if diet and exercise are not working.  . If you smoke, find out from your health care provider how to quit. If you do not use tobacco, please do not start.  . If you choose to drink alcohol, please do not consume more than 2 drinks per day. One drink is considered to be 12 ounces (355 mL) of beer, 5 ounces (148 mL) of wine, or 1.5 ounces (44 mL) of liquor.  .  If you are 34-5 years old, ask your health care provider if you should take aspirin to prevent strokes.  . Use sunscreen. Apply sunscreen liberally and repeatedly throughout the day. You should seek shade when your shadow is shorter than you. Protect yourself by wearing long sleeves, pants, a wide-brimmed hat, and sunglasses year round, whenever you are outdoors.  . Once a month, do a whole body skin exam, using a mirror to look at the skin on your back. Tell your health care provider of new moles, moles that have irregular borders, moles that are larger than a pencil eraser, or moles that have changed in shape or color.

## 2018-05-04 NOTE — Assessment & Plan Note (Signed)
Consider statin. FLP pending.

## 2018-05-04 NOTE — Assessment & Plan Note (Signed)
Continue to encourage healthy diet and lifestyle for sustainable weight loss.

## 2018-05-04 NOTE — Assessment & Plan Note (Signed)
Pending updated FLP. The ASCVD Risk score Mikey Bussing DC Jr., et al., 2013) failed to calculate for the following reasons:   The valid total cholesterol range is 130 to 320 mg/dL

## 2018-05-04 NOTE — Progress Notes (Signed)
PCP notes:   Health maintenance:  No gaps identified.   Abnormal screenings:   Hearing - failed  Hearing Screening   125Hz  250Hz  500Hz  1000Hz  2000Hz  3000Hz  4000Hz  6000Hz  8000Hz   Right ear:   40 40 40  0    Left ear:   40 40 40  40    Vision Screening Comments: Vision exam in 2019 with Dr. Ellie Lunch  Mini-Cog score: 18/20 MMSE - Mini Mental State Exam 05/04/2018 10/15/2015  Orientation to time 5 5  Orientation to Place 5 5  Registration 3 3  Attention/ Calculation 0 0  Recall 1 3  Recall-comments unable to recall 2 of 3 words -  Language- name 2 objects 0 0  Language- repeat 1 1  Language- follow 3 step command 3 3  Language- read & follow direction 0 0  Write a sentence 0 0  Copy design 0 0  Total score 18 20     Patient concerns:   Intermittent lower back pain - 3/10  Nurse concerns:  None  Next PCP appt:   05/04/18 @ 1130

## 2018-05-04 NOTE — Assessment & Plan Note (Signed)
By CT - but patient overall asymptomatic. Will continue to monitor off respiratory meds. Quit smoking 2008

## 2018-05-04 NOTE — Patient Instructions (Addendum)
For athlete's foot - try lotrimin (clotrimazole) between toes twice daliy for 2-4 weeks. Let us know if not improving with this.  If interested, check with pharmacy about new 2 shot shingles series (shingrix).  You are doing well today We will call you with lab results.  Return as needed or in 1 year for next wellness visit and f/u  Health Maintenance After Age 71 After age 52, you are at a higher risk for certain long-term diseases and infections as well as injuries from falls. Falls are a major cause of broken bones and head injuries in people who are older than age 31. Getting regular preventive care can help to keep you healthy and well. Preventive care includes getting regular testing and making lifestyle changes as recommended by your health care provider. Talk with your health care provider about:  Which screenings and tests you should have. A screening is a test that checks for a disease when you have no symptoms.  A diet and exercise plan that is right for you. What should I know about screenings and tests to prevent falls? Screening and testing are the best ways to find a health problem early. Early diagnosis and treatment give you the best chance of managing medical conditions that are common after age 27. Certain conditions and lifestyle choices may make you more likely to have a fall. Your health care provider may recommend:  Regular vision checks. Poor vision and conditions such as cataracts can make you more likely to have a fall. If you wear glasses, make sure to get your prescription updated if your vision changes.  Medicine review. Work with your health care provider to regularly review all of the medicines you are taking, including over-the-counter medicines. Ask your health care provider about any side effects that may make you more likely to have a fall. Tell your health care provider if any medicines that you take make you feel dizzy or sleepy.  Osteoporosis screening.  Osteoporosis is a condition that causes the bones to get weaker. This can make the bones weak and cause them to break more easily.  Blood pressure screening. Blood pressure changes and medicines to control blood pressure can make you feel dizzy.  Strength and balance checks. Your health care provider may recommend certain tests to check your strength and balance while standing, walking, or changing positions.  Foot health exam. Foot pain and numbness, as well as not wearing proper footwear, can make you more likely to have a fall.  Depression screening. You may be more likely to have a fall if you have a fear of falling, feel emotionally low, or feel unable to do activities that you used to do.  Alcohol use screening. Using too much alcohol can affect your balance and may make you more likely to have a fall. What actions can I take to lower my risk of falls? General instructions  Talk with your health care provider about your risks for falling. Tell your health care provider if: ? You fall. Be sure to tell your health care provider about all falls, even ones that seem minor. ? You feel dizzy, sleepy, or off-balance.  Take over-the-counter and prescription medicines only as told by your health care provider. These include any supplements.  Eat a healthy diet and maintain a healthy weight. A healthy diet includes low-fat dairy products, low-fat (lean) meats, and fiber from whole grains, beans, and lots of fruits and vegetables. Home safety  Remove any tripping hazards, such as  rugs, cords, and clutter.  Install safety equipment such as grab bars in bathrooms and safety rails on stairs.  Keep rooms and walkways well-lit. Activity   Follow a regular exercise program to stay fit. This will help you maintain your balance. Ask your health care provider what types of exercise are appropriate for you.  If you need a cane or walker, use it as recommended by your health care provider.  Wear  supportive shoes that have nonskid soles. Lifestyle  Do not drink alcohol if your health care provider tells you not to drink.  If you drink alcohol, limit how much you have: ? 0-1 drink a day for women. ? 0-2 drinks a day for men.  Be aware of how much alcohol is in your drink. In the [place] one drink equals one typical bottle of beer (12 oz), one-half glass of wine (5 oz), or one shot of hard liquor (1 oz).  Do not use any products that contain nicotine or tobacco, such as cigarettes and e-cigarettes. If you need help quitting, ask your health care provider. Summary  Having a healthy lifestyle and getting preventive care can help to protect your health and wellness after age 16.  Screening and testing are the best way to find a health problem early and help you avoid having a fall. Early diagnosis and treatment give you the best chance for managing medical conditions that are more common for people who are older than age 38.  Falls are a major cause of broken bones and head injuries in people who are older than age 64. Take precautions to prevent a fall at home.  Work with your health care provider to learn what changes you can make to improve your health and wellness and to prevent falls. This information is not intended to replace advice given to you by your health care provider. Make sure you discuss any questions you have with your health care provider. Document Released: 01/11/2017 Document Revised: 01/11/2017 Document Reviewed: 01/11/2017 Elsevier Interactive Patient Education  2019 [employer].

## 2018-05-04 NOTE — Assessment & Plan Note (Signed)
rec lotrimin. Discussed home care. Update if not improved

## 2018-05-04 NOTE — Progress Notes (Signed)
BP (!) 146/84 (BP Location: Right Arm, Patient Position: Sitting, Cuff Size: Normal)   Pulse 65   Temp 97.8 F (36.6 C) (Oral)   Ht 5\' 8"  (1.727 m) Comment: no shoes  Wt 217 lb 4.8 oz (98.6 kg)   SpO2 97%   BMI 33.04 kg/m   Similar reading on repeat BP  CC: [facility] f/u visit Subjective:    Patient ID: Ryan Mathews, male    DOB: 08/28/1945, 73 y.o.   MRN: [id number]  HPI: Ryan Mathews is a 73 y.o. male presenting on 05/04/2018 for Annual Exam (Pt 2. )   Annia Belt today for medicare wellness visit. Note reviewed.    L hand swelling doing better s/p treatment for cellulitis and osteoarthritis late last year.   Possible athlete's foot between L 4th/5th toes - treating with neosporin. Initial powder didn't help.   Denies COPD sxs. Quit smoking 2008. Undergoing lung cancer screening CTs.   Recently tweaked lower back - taking ibuprofen for this.  Trying to limit zegerid use.   Preventative: COLONOSCOPY 07/2017 multiple tubular adenomas, rpt 3 yrs ([facility]) Prostate cancer screen - PSA reassuring. DRE reassuring 2019. Agrees to DRE today and if reassuring consider spacing out.  Lung cancer screening - ex smoker quit 2008. Discussed screening -pt interested.  Flu shotyearly Tetanusunsure Pneumovax 2013, prevnar 10/2015, pneumovax 04/2017 zostavax 2013 shingrix - discussed.  Advanced directive discussion: working with Manufacturing systems engineer to set this up. Would like wife to be HCPOA. Doesn't want prolonged life support. Asked to bring me a copy.  Seat belt use discussed Sunscreen use discussed. No changing moles on skin.  Ex smoker - quit 2008. >30 PY hx Alcohol - seldom Dentist - hasn't seen recently  Eye exam yearly   Caffeine: 2 coffee, 2 tea/day Lives with wife and 4 cats and 1 dog, grown children Occupation: Solicitor  Edu: HS Activity: stays active at work and in garden  Diet: poor water intake, fruits/vegetables daily      Relevant past medical,  surgical, family and social history reviewed and updated as indicated. Interim medical history since our last visit reviewed. Allergies and medications reviewed and updated. Outpatient Medications Prior to Visit  Medication Sig Dispense Refill  . b complex vitamins tablet Take 1 tablet by mouth daily.    Marland Kitchen ibuprofen (ADVIL,MOTRIN) 600 MG tablet Take 600 mg by mouth every 6 (six) hours as needed.    . vitamin C (ASCORBIC ACID) 500 MG tablet Take 500 mg by mouth daily.    Marland Kitchen omeprazole-sodium bicarbonate (ZEGERID) 40-1100 MG per capsule Take 1 capsule by mouth daily as needed.    . predniSONE (DELTASONE) 20 MG tablet Take two tablets daily for 3 days followed by one tablet daily for 4 days 10 tablet 0  . 0.9 %  sodium chloride infusion      No facility-administered medications prior to visit.      Per HPI unless specifically indicated in ROS section below Review of Systems Objective:    BP (!) 146/84 (BP Location: Right Arm, Patient Position: Sitting, Cuff Size: Normal)   Pulse 65   Temp 97.8 F (36.6 C) (Oral)   Ht 5\' 8"  (1.727 m) Comment: no shoes  Wt 217 lb 4.8 oz (98.6 kg)   SpO2 97%   BMI 33.04 kg/m   Wt Readings from Last 3 Encounters:  05/04/18 217 lb 4.8 oz (98.6 kg)  05/04/18 217 lb 4.8 oz (98.6 kg)  03/08/18 224 lb 12  oz (101.9 kg)    Physical Exam Vitals signs and nursing note reviewed.  Constitutional:      General: He is not in acute distress.    Appearance: He is well-developed.  HENT:     Head: Normocephalic and atraumatic.     Right Ear: Hearing, tympanic membrane, ear canal and external ear normal.     Left Ear: Hearing, tympanic membrane, ear canal and external ear normal.     Nose: Nose normal.     Mouth/Throat:     Mouth: Mucous membranes are moist.     Pharynx: Oropharynx is clear. Uvula midline. No oropharyngeal exudate or posterior oropharyngeal erythema.  Eyes:     General: No scleral icterus.    Conjunctiva/sclera: Conjunctivae normal.     Pupils:  Pupils are equal, round, and reactive to light.  Neck:     Musculoskeletal: Normal range of motion and neck supple.     Vascular: No carotid bruit.  Cardiovascular:     Rate and Rhythm: Normal rate and regular rhythm.     Pulses: Normal pulses.          Radial pulses are 2+ on the right side and 2+ on the left side.     Heart sounds: Normal heart sounds. No murmur.  Pulmonary:     Effort: Pulmonary effort is normal. No respiratory distress.     Breath sounds: Normal breath sounds. No wheezing, rhonchi or rales.  Abdominal:     General: Abdomen is flat. Bowel sounds are normal. There is no distension.     Palpations: Abdomen is soft. There is no mass.     Tenderness: There is no abdominal tenderness. There is no guarding or rebound.  Musculoskeletal: Normal range of motion.  Lymphadenopathy:     Cervical: No cervical adenopathy.  Skin:    General: Skin is warm and dry.     Findings: No rash.  Neurological:     Mental Status: He is alert and oriented to person, place, and time.     Comments: CN grossly intact, station and gait intact  Psychiatric:        Behavior: Behavior normal.        Thought Content: Thought content normal.        Judgment: Judgment normal.       Results for orders placed or performed in visit on 04/21/17  Lipid panel  Result Value Ref Range   Cholesterol 119 0 - 200 mg/dL   Triglycerides 112.0 0.0 - 149.0 mg/dL   HDL 28.60 (L) >39.00 mg/dL   VLDL 22.4 0.0 - 40.0 mg/dL   LDL Cholesterol 68 0 - 99 mg/dL   Total CHOL/HDL Ratio 4    NonHDL 90.49   TSH  Result Value Ref Range   TSH 2.71 0.35 - 4.50 uIU/mL  Basic metabolic panel  Result Value Ref Range   Sodium 138 135 - 145 mEq/L   Potassium 4.0 3.5 - 5.1 mEq/L   Chloride 103 96 - 112 mEq/L   CO2 30 19 - 32 mEq/L   Glucose, Bld 103 (H) 70 - 99 mg/dL   BUN 13 6 - 23 mg/dL   Creatinine, Ser 0.96 0.40 - 1.50 mg/dL   Calcium 9.1 8.4 - 10.5 mg/dL   GFR 82.28 >60.00 mL/min  PSA, Medicare  Result Value  Ref Range   PSA 0.97 0.10 - 4.00 ng/ml   Assessment & Plan:   Problem List Items Addressed This Visit    Tinea pedis  rec lotrimin. Discussed home care. Update if not improved      Thoracic aorta atherosclerosis (HCC)    Consider statin. FLP pending.       Obesity, Class I, BMI 30-34.9 - Primary    Continue to encourage healthy diet and lifestyle for sustainable weight loss.       Hepatic steatosis    Marked by CT - did not discuss this today.       GERD (gastroesophageal reflux disease)    Trying to decrease zegerid use.       Relevant Medications   Omeprazole-Sodium Bicarbonate (ZEGERID OTC) 20-1100 MG CAPS capsule   Dyslipidemia    Pending updated FLP. The ASCVD Risk score Mikey Bussing DC Jr., et al., 2013) failed to calculate for the following reasons:   The valid total cholesterol range is 130 to 320 mg/dL       Bullous emphysema (HCC)    By CT - but patient overall asymptomatic. Will continue to monitor off respiratory meds. Quit smoking 2008          Meds ordered this encounter  Medications  . Omeprazole-Sodium Bicarbonate (ZEGERID OTC) 20-1100 MG CAPS capsule    Sig: Take 1 capsule by mouth daily before breakfast.    Dispense:  28 each    Refill:  0   No orders of the defined types were placed in this encounter.   Patient instructions: For athlete's foot - try lotrimin (clotrimazole) between toes twice daliy for 2-4 weeks. Let us know if not improving with this.  If interested, check with pharmacy about new 2 shot shingles series (shingrix).  You are doing well today We will call you with lab results.  Return as needed or in 1 year for next wellness visit and f/u  Follow up plan: Return in about 1 year (around 05/05/2019) for medicare wellness visit, follow up visit.  Ria Bush, MD

## 2018-05-07 ENCOUNTER — Telehealth: Payer: Self-pay

## 2018-05-07 NOTE — Telephone Encounter (Signed)
Left message for patient to call back about results. 

## 2018-05-07 NOTE — Telephone Encounter (Signed)
Patient advised of lab results

## 2018-06-08 ENCOUNTER — Ambulatory Visit (INDEPENDENT_AMBULATORY_CARE_PROVIDER_SITE_OTHER): Payer: Medicare Other | Admitting: Family Medicine

## 2018-06-08 ENCOUNTER — Encounter: Payer: Self-pay | Admitting: Family Medicine

## 2018-06-08 ENCOUNTER — Other Ambulatory Visit: Payer: Self-pay

## 2018-06-08 DIAGNOSIS — R0981 Nasal congestion: Secondary | ICD-10-CM

## 2018-06-08 NOTE — Progress Notes (Signed)
   Ryan Mathews - 73 y.o. male  MRN [id number]  Date of Birth: [birth year]  PCP: Ria Bush, MD  This service was provided via telemedicine. Phone Visit performed on 06/08/2018    Rationale for phone visit along with limitations reviewed. Patient consented to telephone encounter.    Location of patient: home Location of provider: office, [place] @ [facility] Name of referring provider: N/A   Names of persons and role in encounter: Provider: Ria Bush, MD  Patient: Ryan Mathews  Other: N/A   Time on call: 7:59am - 8:11am   Subjective: CC: head congestion HPI:  2d h/o head congestion "head fuzzy". Both eyes itchy. Mild sneezing. Mild scratchy throat this morning.   No fever. No cough, rhinorrhea, ear or tooth pain.   Recent R eye stye.   Has tried oscillococcinum TID without relief.  No h/o seasonal allergies.  No h/o asthma.  No sick contacts at home.    Objective/Observations:   No physical exam or vital signs collected unless specifically identified. Afebrile at home (96F this morning).  Respiratory status: speaks in complete sentences without evident shortness of breath. Did not sound congested.   Assessment/Plan:  Head congestion Anticipate allergic vs viral. Supportive care reviewed - rec nasal saline and antihistamine. Discussed return to work when feeling better. This is not covid19.    I discussed the assessment and treatment plan with the patient. The patient was provided an opportunity to ask questions and all were answered. The patient agreed with the plan and demonstrated an understanding of the instructions.  Lab Orders  No laboratory test(s) ordered today    No orders of the defined types were placed in this encounter.   The patient was advised to call back or seek an in-person evaluation if the symptoms worsen or if the condition fails to improve as anticipated.  Ria Bush, MD

## 2018-06-08 NOTE — Assessment & Plan Note (Signed)
Anticipate allergic vs viral. Supportive care reviewed - rec nasal saline and antihistamine. Discussed return to work when feeling better. This is not covid19.

## 2018-07-16 NOTE — Progress Notes (Signed)
I reviewed health advisor's note, was available for consultation, and agree with documentation and plan.  

## 2018-10-12 DIAGNOSIS — H5203 Hypermetropia, bilateral: Secondary | ICD-10-CM | POA: Diagnosis not present

## 2018-10-12 DIAGNOSIS — H3500 Unspecified background retinopathy: Secondary | ICD-10-CM | POA: Diagnosis not present

## 2018-10-12 DIAGNOSIS — H2513 Age-related nuclear cataract, bilateral: Secondary | ICD-10-CM | POA: Diagnosis not present

## 2018-10-26 ENCOUNTER — Ambulatory Visit (INDEPENDENT_AMBULATORY_CARE_PROVIDER_SITE_OTHER): Payer: Medicare Other | Admitting: Family Medicine

## 2018-10-26 ENCOUNTER — Other Ambulatory Visit: Payer: Self-pay

## 2018-10-26 ENCOUNTER — Other Ambulatory Visit: Payer: Self-pay | Admitting: Family Medicine

## 2018-10-26 ENCOUNTER — Encounter: Payer: Self-pay | Admitting: Family Medicine

## 2018-10-26 VITALS — BP 118/76 | HR 68 | Temp 98.6°F | Ht 68.0 in

## 2018-10-26 DIAGNOSIS — Z20822 Contact with and (suspected) exposure to covid-19: Secondary | ICD-10-CM

## 2018-10-26 DIAGNOSIS — J439 Emphysema, unspecified: Secondary | ICD-10-CM | POA: Diagnosis not present

## 2018-10-26 DIAGNOSIS — J22 Unspecified acute lower respiratory infection: Secondary | ICD-10-CM | POA: Diagnosis not present

## 2018-10-26 MED ORDER — DOXYCYCLINE HYCLATE 100 MG PO TABS: 100.0000 mg | ORAL_TABLET | Freq: Two times a day (BID) | ORAL | 0 refills | Status: DC

## 2018-10-26 NOTE — Assessment & Plan Note (Signed)
Does not sound like Covid19 infection - but he has already been tested today per work request. Anticipate acute respiratory infection. He has cough syrup he has been using with benefit - will let us know if needs refill. discused viral vs bacterial infection. In h/o emphysema by imaging, will Rx WASP for doxycycline course with indications when to fill. Pt agrees with plan.

## 2018-10-26 NOTE — Progress Notes (Signed)
Virtual visit completed through [employer]. Due to national recommendations of social distancing due to COVID-19, a virtual visit is felt to be most appropriate for this patient at this time. Reviewed limitations of a virtual visit.   Patient location: home Provider location:  at [facility], office If any vitals were documented, they were collected by patient at home unless specified below.    BP 118/76   Pulse 68   Temp 98.6 F (37 C)   Ht 5\' 8"  (1.727 m)   BMI 33.04 kg/m    CC: head cold? Subjective:    Patient ID: Ryan Mathews, male    DOB: May 13, 1945, 73 y.o.   MRN: [id number]  HPI: Ryan Mathews is a 73 y.o. male presenting on 10/26/2018 for Nasal Congestion (C/o runny nose, sneezing, cough and nasal drainage.  Denies any fever.  Had COVID test, requested by work due to being sick. Sxs started about 3 days. Thinks he has a cold. Feels like it is in his chest.  Tried Alka seltzer nightime and Sudafed.  H/o pneumonia but says it does not feel like that. )   On vacation this week at home.   3d h/o rhinorrhea progressing to chest congestion, coughing, sneezing. Chills 2 nights ago, not since. ST this morning, not since. Cough productive of mucous. No wheezing. No asthma history. Ex smoker. H/o COPD by imaging off respiratory medicines. H/o PNA in the past, this feels differently.   No dyspnea, fevers/chills, myalgias, body aches, nausea, abd pain, diarrhea, loss of taste or smell.  No known Covid exposure.   Has tried sudafed and Copywriter, advertising for symptoms as well as claritin.   Just had covid test completed per work request (at [facility] [employer] [facility]).  Has been wearing masks at work.      Relevant past medical, surgical, family and social history reviewed and updated as indicated. Interim medical history since our last visit reviewed. Allergies and medications reviewed and updated. Outpatient Medications Prior to Visit  Medication Sig Dispense Refill  . b complex vitamins  tablet Take 1 tablet by mouth daily.    Marland Kitchen ibuprofen (ADVIL,MOTRIN) 600 MG tablet Take 600 mg by mouth every 6 (six) hours as needed.    Earney Navy Bicarbonate (ZEGERID OTC) 20-1100 MG CAPS capsule Take 1 capsule by mouth daily before breakfast. 28 each 0  . vitamin C (ASCORBIC ACID) 500 MG tablet Take 500 mg by mouth daily.     No facility-administered medications prior to visit.      Per HPI unless specifically indicated in ROS section below Review of Systems Objective:    BP 118/76   Pulse 68   Temp 98.6 F (37 C)   Ht 5\' 8"  (1.727 m)   BMI 33.04 kg/m   Wt Readings from Last 3 Encounters:  05/04/18 217 lb 4.8 oz (98.6 kg)  05/04/18 217 lb 4.8 oz (98.6 kg)  03/08/18 224 lb 12 oz (101.9 kg)     Physical exam: Gen: alert, NAD, not ill appearing Pulm: speaks in complete sentences without increased work of breathing Psych: normal mood, normal thought content      Assessment & Plan:   Problem List Items Addressed This Visit    Bullous emphysema ([place])   Acute respiratory infection - Primary    Does not sound like Covid19 infection - but he has already been tested today per work request. Anticipate acute respiratory infection. He has cough syrup he has been using with benefit - will let  us know if needs refill. discused viral vs bacterial infection. In h/o emphysema by imaging, will Rx WASP for doxycycline course with indications when to fill. Pt agrees with plan.           Meds ordered this encounter  Medications  . doxycycline (VIBRA-TABS) 100 MG tablet    Sig: Take 1 tablet (100 mg total) by mouth 2 (two) times daily.    Dispense:  14 tablet    Refill:  0   No orders of the defined types were placed in this encounter.   I discussed the assessment and treatment plan with the patient. The patient was provided an opportunity to ask questions and all were answered. The patient agreed with the plan and demonstrated an understanding of the instructions. The patient  was advised to call back or seek an in-person evaluation if the symptoms worsen or if the condition fails to improve as anticipated.  Follow up plan: No follow-ups on file.  Ria Bush, MD

## 2018-10-28 LAB — NOVEL CORONAVIRUS, NAA: SARS-CoV-2, NAA: NOT DETECTED

## 2018-11-23 ENCOUNTER — Telehealth: Payer: Self-pay | Admitting: Family Medicine

## 2018-11-23 DIAGNOSIS — Z23 Encounter for immunization: Secondary | ICD-10-CM | POA: Diagnosis not present

## 2018-11-23 NOTE — Telephone Encounter (Signed)
Patient would like to know when he had his pneumonia and his shingrix shot.   Requested a call back

## 2018-11-26 NOTE — Telephone Encounter (Signed)
Spoke with pt informing him of pneumonia shot dates.  But I let him know we are not showing he has had Shingrix vaccine, just Zostavax.  Pt verbalizes understanding and expresses his thanks.

## 2018-11-30 ENCOUNTER — Ambulatory Visit (INDEPENDENT_AMBULATORY_CARE_PROVIDER_SITE_OTHER)
Admission: RE | Admit: 2018-11-30 | Discharge: 2018-11-30 | Disposition: A | Discharge status: Home / Self Care | Payer: Medicare Other | Source: Ambulatory Visit | Attending: Acute Care | Admitting: Acute Care

## 2018-11-30 ENCOUNTER — Other Ambulatory Visit: Payer: Self-pay

## 2018-11-30 ENCOUNTER — Encounter (INDEPENDENT_AMBULATORY_CARE_PROVIDER_SITE_OTHER): Payer: Self-pay

## 2018-11-30 DIAGNOSIS — Z122 Encounter for screening for malignant neoplasm of respiratory organs: Secondary | ICD-10-CM

## 2018-11-30 DIAGNOSIS — Z87891 Personal history of nicotine dependence: Secondary | ICD-10-CM | POA: Diagnosis not present

## 2018-12-03 ENCOUNTER — Ambulatory Visit: Payer: Medicare Other

## 2018-12-06 ENCOUNTER — Telehealth: Payer: Self-pay | Admitting: Acute Care

## 2018-12-06 DIAGNOSIS — Z122 Encounter for screening for malignant neoplasm of respiratory organs: Secondary | ICD-10-CM

## 2018-12-06 DIAGNOSIS — Z87891 Personal history of nicotine dependence: Secondary | ICD-10-CM

## 2018-12-06 NOTE — Telephone Encounter (Signed)
Pt informed of CT results per Sarah Groce, NP.  PT verbalized understanding.  Copy sent to PCP.  Order placed for 1 yr f/u CT.  

## 2019-03-25 ENCOUNTER — Ambulatory Visit: Payer: Medicare Other | Attending: Internal Medicine

## 2019-03-25 DIAGNOSIS — Z20822 Contact with and (suspected) exposure to covid-19: Secondary | ICD-10-CM

## 2019-03-27 LAB — NOVEL CORONAVIRUS, NAA: SARS-CoV-2, NAA: NOT DETECTED

## 2019-05-10 ENCOUNTER — Ambulatory Visit: Payer: Medicare Other

## 2019-05-10 ENCOUNTER — Ambulatory Visit (INDEPENDENT_AMBULATORY_CARE_PROVIDER_SITE_OTHER): Payer: Medicare Other | Admitting: Family Medicine

## 2019-05-10 ENCOUNTER — Encounter: Payer: Self-pay | Admitting: Family Medicine

## 2019-05-10 ENCOUNTER — Other Ambulatory Visit: Payer: Self-pay

## 2019-05-10 VITALS — BP 132/78 | HR 73 | Temp 97.7°F | Ht 68.0 in | Wt 224.4 lb

## 2019-05-10 DIAGNOSIS — Z125 Encounter for screening for malignant neoplasm of prostate: Secondary | ICD-10-CM | POA: Diagnosis not present

## 2019-05-10 DIAGNOSIS — K760 Fatty (change of) liver, not elsewhere classified: Secondary | ICD-10-CM

## 2019-05-10 DIAGNOSIS — E669 Obesity, unspecified: Secondary | ICD-10-CM

## 2019-05-10 DIAGNOSIS — K219 Gastro-esophageal reflux disease without esophagitis: Secondary | ICD-10-CM

## 2019-05-10 DIAGNOSIS — Z7189 Other specified counseling: Secondary | ICD-10-CM | POA: Diagnosis not present

## 2019-05-10 DIAGNOSIS — Z0000 Encounter for general adult medical examination without abnormal findings: Secondary | ICD-10-CM

## 2019-05-10 DIAGNOSIS — I700 Atherosclerosis of aorta: Secondary | ICD-10-CM

## 2019-05-10 DIAGNOSIS — J439 Emphysema, unspecified: Secondary | ICD-10-CM | POA: Diagnosis not present

## 2019-05-10 DIAGNOSIS — E785 Hyperlipidemia, unspecified: Secondary | ICD-10-CM | POA: Diagnosis not present

## 2019-05-10 DIAGNOSIS — Z87891 Personal history of nicotine dependence: Secondary | ICD-10-CM

## 2019-05-10 NOTE — Progress Notes (Signed)
This visit was conducted in person.  BP 132/78 (BP Location: Left Arm, Patient Position: Sitting, Cuff Size: Large)   Pulse 73   Temp 97.7 F (36.5 C) (Temporal)   Ht 5\' 8"  (1.727 m)   Wt 224 lb 7 oz (101.8 kg)   SpO2 96%   BMI 34.13 kg/m    CC: AMW Subjective:    Patient ID: Ryan Mathews, male    DOB: Jul 07, 1945, 74 y.o.   MRN: [id number]  HPI: Ryan Mathews is a 74 y.o. male presenting on 05/10/2019 for Medicare Wellness   Did not see health advisor this year.    Hearing Screening   125Hz  250Hz  500Hz  1000Hz  2000Hz  3000Hz  4000Hz  6000Hz  8000Hz   Right ear:   40 25 40  0    Left ear:   20 25 20   40    Vision Screening Comments: Last eye exam, 08/2018.     Office Visit from 05/10/2019 in [place] at [facility]  PHQ-2 Total Score  0     Fall Risk  05/10/2019 05/04/2018 04/21/2017 10/15/2015  Falls in the past year? 0 0 No No    Preventative: COLONOSCOPY 07/2017 multiple tubular adenomas, rpt 3 yrs ([place]) Prostate cancer screen - PSA reassuring.DRE reassuring 2019. Agrees to DRE today and if reassuring consider spacing out. Lung cancer screening - ex smoker quit 2008. Has had 3 lung cancer screening CTs (first 2 were 6 months apart) Flu shotyearly Tetanusunsure Pneumovax 2013, prevnar 10/2015, pneumovax 04/2017 [id number] shingrix - discussed. Completed Pfizer Covid vaccine Advanced directive discussion: working with Manufacturing systems engineer to set this up. Would like wife to be HCPOA. Doesn't want prolonged life support. Asked to bring me a copy.  Seat belt use discussed.  Sunscreen use discussed. No changing moles on skin.  Ex smoker - quit 2008. >30 PY hx Alcohol - seldom  Dentist - hasn't seen recently  Eye exam yearly  Bowel - no constipation Bladder - no incontinence  Caffeine: 2 coffee, 2 tea/day Lives with wife and 4 cats and 1 dog, grown children Occupation: [place]  Edu: HS Activity: stays active at work and in  garden  Diet: poor water intake, fruits/vegetables daily     Relevant past medical, surgical, family and social history reviewed and updated as indicated. Interim medical history since our last visit reviewed. Allergies and medications reviewed and updated. Outpatient Medications Prior to Visit  Medication Sig Dispense Refill  . b complex vitamins tablet Take 1 tablet by mouth daily.    Earney Navy Bicarbonate (ZEGERID OTC) 20-1100 MG CAPS capsule Take 1 capsule by mouth daily before breakfast. 28 each 0  . vitamin C (ASCORBIC ACID) 500 MG tablet Take 500 mg by mouth daily.    Marland Kitchen doxycycline (VIBRA-TABS) 100 MG tablet Take 1 tablet (100 mg total) by mouth 2 (two) times daily. 14 tablet 0  . ibuprofen (ADVIL,MOTRIN) 600 MG tablet Take 600 mg by mouth every 6 (six) hours as needed.     No facility-administered medications prior to visit.     Per HPI unless specifically indicated in ROS section below Review of Systems   Objective:    BP 132/78 (BP Location: Left Arm, Patient Position: Sitting, Cuff Size: Large)   Pulse 73   Temp 97.7 F (36.5 C) (Temporal)   Ht 5\' 8"  (1.727 m)   Wt 224 lb 7 oz (101.8 kg)   SpO2 96%   BMI 34.13 kg/m   Wt Readings from  Last 3 Encounters:  05/10/19 224 lb 7 oz (101.8 kg)  05/04/18 217 lb 4.8 oz (98.6 kg)  05/04/18 217 lb 4.8 oz (98.6 kg)    Physical Exam Vitals and nursing note reviewed.  Constitutional:      General: He is not in acute distress.    Appearance: Normal appearance. He is well-developed. He is obese. He is not ill-appearing.  HENT:     Head: Normocephalic and atraumatic.     Right Ear: Hearing, tympanic membrane, ear canal and external ear normal.     Left Ear: Hearing, tympanic membrane, ear canal and external ear normal.     Mouth/Throat:     Pharynx: Uvula midline.  Eyes:     General: No scleral icterus.    Extraocular Movements: Extraocular movements intact.     Conjunctiva/sclera: Conjunctivae normal.      Pupils: Pupils are equal, round, and reactive to light.     Comments: Bilateral cataracts present  Cardiovascular:     Rate and Rhythm: Normal rate and regular rhythm.     Pulses: Normal pulses.          Radial pulses are 2+ on the right side and 2+ on the left side.     Heart sounds: Normal heart sounds. No murmur.  Pulmonary:     Effort: Pulmonary effort is normal. No respiratory distress.     Breath sounds: Normal breath sounds. No wheezing, rhonchi or rales.  Abdominal:     General: Abdomen is flat. Bowel sounds are normal. There is no distension.     Palpations: Abdomen is soft. There is no mass.     Tenderness: There is no abdominal tenderness. There is no guarding or rebound.     Hernia: No hernia is present.  Musculoskeletal:        General: Normal range of motion.     Cervical back: Normal range of motion and neck supple.     Right lower leg: No edema.     Left lower leg: No edema.  Lymphadenopathy:     Cervical: No cervical adenopathy.  Skin:    General: Skin is warm and dry.     Findings: No rash.  Neurological:     General: No focal deficit present.     Mental Status: He is alert and oriented to person, place, and time.     Comments:  CN grossly intact, station and gait intact Recall 2/3, 3/3 with cue Calculation 4/5 DLORW  Psychiatric:        Mood and Affect: Mood normal.        Behavior: Behavior normal.        Thought Content: Thought content normal.        Judgment: Judgment normal.       Results for orders placed or performed in visit on 03/25/19  Novel Coronavirus, NAA ([facility])   Specimen: Nasopharyngeal(NP) swabs in vial transport medium   NASOPHARYNGE  TESTING  Result Value Ref Range   SARS-CoV-2, NAA Not Detected Not Detected   Assessment & Plan:  This visit occurred during the SARS-CoV-2 public health emergency.  Safety protocols were in place, including screening questions prior to the visit, additional usage of staff PPE, and extensive cleaning  of exam room while observing appropriate contact time as indicated for disinfecting solutions.   Problem List Items Addressed This Visit    Thoracic aorta atherosclerosis ([place])    By imaging. Not on statin.       Obesity, Class  I, BMI 30-34.9    Weight gain noted. More sedentary this year due to pandemic. Motivated to renew healthy diet/lifestyle with improving weather.       Medicare annual wellness visit, subsequent - Primary    I have personally reviewed the Medicare Annual Wellness questionnaire and have noted 1. The patient's medical and social history 2. Their use of alcohol, tobacco or illicit drugs 3. Their current medications and supplements 4. The patient's functional ability including ADL's, fall risks, home safety risks and hearing or visual impairment. Cognitive function has been assessed and addressed as indicated.  5. Diet and physical activity 6. Evidence for depression or mood disorders The patients weight, height, BMI have been recorded in the chart. I have made referrals, counseling and provided education to the patient based on review of the above and I have provided the pt with a written personalized care plan for preventive services. Provider list updated.. See scanned questionairre as needed for further documentation. Reviewed preventative protocols and updated unless pt declined.       Hepatic steatosis    By imaging. Pt aware.       GERD (gastroesophageal reflux disease)    Manages this with OTC zegerid and black licorice at night       Ex-smoker    Undergoing lung cancer screening CT program.       Dyslipidemia    Update FLP when he returns fasting.       Relevant Orders   Lipid panel   Comprehensive metabolic panel   TSH   Bullous emphysema ([place])    Patient remains asymptomatic, not needing respiratory medication. Quit smoking 2008.       Advanced care planning/counseling discussion    Advanced directive discussion: working with estate  planner to set this up. Would like wife to be HCPOA. Doesn't want prolonged life support. Asked to bring me a copy.        Other Visit Diagnoses    Special screening for malignant neoplasm of prostate       Relevant Orders   PSA, Medicare       No orders of the defined types were placed in this encounter.  Orders Placed This Encounter  Procedures  . Lipid panel    Standing Status:   Future    Standing Expiration Date:   05/09/2020  . Comprehensive metabolic panel    Standing Status:   Future    Standing Expiration Date:   05/09/2020  . TSH    Standing Status:   Future    Standing Expiration Date:   05/09/2020  . PSA, Medicare    Standing Status:   Future    Standing Expiration Date:   05/09/2020    Patient instructions: Return at your convenience for fasting labs (4 hours).  If interested, check with pharmacy about new 2 shot shingles series (shingrix).  Bring [place] copy of your living will when completed.  You are doing well. Return as needed or in 1 year for next physical.   Follow up plan: Return in about 1 year (around 05/09/2020) for medicare wellness visit, follow up visit.  Ria Bush, MD

## 2019-05-10 NOTE — Assessment & Plan Note (Signed)
Undergoing lung cancer screening CT program.

## 2019-05-10 NOTE — Assessment & Plan Note (Signed)
Update FLP when he returns fasting ?

## 2019-05-10 NOTE — Assessment & Plan Note (Signed)
Manages this with OTC zegerid and black licorice at night

## 2019-05-10 NOTE — Patient Instructions (Addendum)
Return at your convenience for fasting labs (4 hours).  If interested, check with pharmacy about new 2 shot shingles series (shingrix).  Bring [place] copy of your living will when completed.  You are doing well. Return as needed or in 1 year for next physical.   Health Maintenance After Age 72 After age 52, you are at a higher risk for certain long-term diseases and infections as well as injuries from falls. Falls are a major cause of broken bones and head injuries in people who are older than age 88. Getting regular preventive care can help to keep you healthy and well. Preventive care includes getting regular testing and making lifestyle changes as recommended by your health care provider. Talk with your health care provider about:  Which screenings and tests you should have. A screening is a test that checks for a disease when you have no symptoms.  A diet and exercise plan that is right for you. What should I know about screenings and tests to prevent falls? Screening and testing are the best ways to find a health problem early. Early diagnosis and treatment give you the best chance of managing medical conditions that are common after age 52. Certain conditions and lifestyle choices may make you more likely to have a fall. Your health care provider may recommend:  Regular vision checks. Poor vision and conditions such as cataracts can make you more likely to have a fall. If you wear glasses, make sure to get your prescription updated if your vision changes.  Medicine review. Work with your health care provider to regularly review all of the medicines you are taking, including over-the-counter medicines. Ask your health care provider about any side effects that may make you more likely to have a fall. Tell your health care provider if any medicines that you take make you feel dizzy or sleepy.  Osteoporosis screening. Osteoporosis is a condition that causes the bones to get weaker. This can make the  bones weak and cause them to break more easily.  Blood pressure screening. Blood pressure changes and medicines to control blood pressure can make you feel dizzy.  Strength and balance checks. Your health care provider may recommend certain tests to check your strength and balance while standing, walking, or changing positions.  Foot health exam. Foot pain and numbness, as well as not wearing proper footwear, can make you more likely to have a fall.  Depression screening. You may be more likely to have a fall if you have a fear of falling, feel emotionally low, or feel unable to do activities that you used to do.  Alcohol use screening. Using too much alcohol can affect your balance and may make you more likely to have a fall. What actions can I take to lower my risk of falls? General instructions  Talk with your health care provider about your risks for falling. Tell your health care provider if: ? You fall. Be sure to tell your health care provider about all falls, even ones that seem minor. ? You feel dizzy, sleepy, or off-balance.  Take over-the-counter and prescription medicines only as told by your health care provider. These include any supplements.  Eat a healthy diet and maintain a healthy weight. A healthy diet includes low-fat dairy products, low-fat (lean) meats, and fiber from whole grains, beans, and lots of fruits and vegetables. Home safety  Remove any tripping hazards, such as rugs, cords, and clutter.  Install safety equipment such as grab bars in bathrooms  and safety rails on stairs.  Keep rooms and walkways well-lit. Activity   Follow a regular exercise program to stay fit. This will help you maintain your balance. Ask your health care provider what types of exercise are appropriate for you.  If you need a cane or walker, use it as recommended by your health care provider.  Wear supportive shoes that have nonskid soles. Lifestyle  Do not drink alcohol if your  health care provider tells you not to drink.  If you drink alcohol, limit how much you have: ? 0-1 drink a day for women. ? 0-2 drinks a day for men.  Be aware of how much alcohol is in your drink. In the [place] one drink equals one typical bottle of beer (12 oz), one-half glass of wine (5 oz), or one shot of hard liquor (1 oz).  Do not use any products that contain nicotine or tobacco, such as cigarettes and e-cigarettes. If you need help quitting, ask your health care provider. Summary  Having a healthy lifestyle and getting preventive care can help to protect your health and wellness after age 60.  Screening and testing are the best way to find a health problem early and help you avoid having a fall. Early diagnosis and treatment give you the best chance for managing medical conditions that are more common for people who are older than age 78.  Falls are a major cause of broken bones and head injuries in people who are older than age 52. Take precautions to prevent a fall at home.  Work with your health care provider to learn what changes you can make to improve your health and wellness and to prevent falls. This information is not intended to replace advice given to you by your health care provider. Make sure you discuss any questions you have with your health care provider. Document Revised: 06/21/2018 Document Reviewed: 01/11/2017 Elsevier Patient Education  2020 [employer].

## 2019-05-10 NOTE — Assessment & Plan Note (Signed)
Advanced directive discussion: working with estate planner to set this up. Would like wife to be HCPOA. Doesn't want prolonged life support. Asked to bring me a copy.  

## 2019-05-10 NOTE — Assessment & Plan Note (Signed)
Patient remains asymptomatic, not needing respiratory medication. Quit smoking 2008.

## 2019-05-10 NOTE — Assessment & Plan Note (Signed)

## 2019-05-10 NOTE — Assessment & Plan Note (Signed)
Weight gain noted. More sedentary this year due to pandemic. Motivated to renew healthy diet/lifestyle with improving weather.

## 2019-05-10 NOTE — Assessment & Plan Note (Signed)
By imaging. Pt aware.

## 2019-05-10 NOTE — Assessment & Plan Note (Signed)
By imaging. Not on statin.

## 2019-05-17 ENCOUNTER — Other Ambulatory Visit: Payer: Self-pay

## 2019-05-17 ENCOUNTER — Other Ambulatory Visit (INDEPENDENT_AMBULATORY_CARE_PROVIDER_SITE_OTHER): Payer: Medicare Other

## 2019-05-17 DIAGNOSIS — Z125 Encounter for screening for malignant neoplasm of prostate: Secondary | ICD-10-CM

## 2019-05-17 DIAGNOSIS — E785 Hyperlipidemia, unspecified: Secondary | ICD-10-CM | POA: Diagnosis not present

## 2019-05-17 LAB — COMPREHENSIVE METABOLIC PANEL
ALT: 38 U/L (ref 0–53)
AST: 28 U/L (ref 0–37)
Albumin: 4.4 g/dL (ref 3.5–5.2)
Alkaline Phosphatase: 78 U/L (ref 39–117)
BUN: 22 mg/dL (ref 6–23)
CO2: 29 mEq/L (ref 19–32)
Calcium: 10 mg/dL (ref 8.4–10.5)
Chloride: 101 mEq/L (ref 96–112)
Creatinine, Ser: 1.05 mg/dL (ref 0.40–1.50)
GFR: 69.4 mL/min (ref >60.00)
Glucose, Bld: 87 mg/dL (ref 70–99)
Potassium: 4.2 mEq/L (ref 3.5–5.1)
Sodium: 139 mEq/L (ref 135–145)
Total Bilirubin: 0.5 mg/dL (ref 0.2–1.2)
Total Protein: 7.5 g/dL (ref 6.0–8.3)

## 2019-05-17 LAB — LIPID PANEL
Cholesterol: 134 mg/dL (ref 0–200)
HDL: 34 mg/dL — ABNORMAL LOW (ref >39.00)
LDL Cholesterol: 68 mg/dL (ref 0–99)
NonHDL: 99.5
Total CHOL/HDL Ratio: 4
Triglycerides: 157 mg/dL — ABNORMAL HIGH (ref 0.0–149.0)
VLDL: 31.4 mg/dL (ref 0.0–40.0)

## 2019-05-17 LAB — PSA, MEDICARE: PSA: 0.85 ng/ml (ref 0.10–4.00)

## 2019-05-17 LAB — TSH: TSH: 2.75 u[IU]/mL (ref 0.35–4.50)

## 2019-06-28 DIAGNOSIS — H43811 Vitreous degeneration, right eye: Secondary | ICD-10-CM | POA: Diagnosis not present

## 2019-09-09 ENCOUNTER — Telehealth: Payer: Self-pay | Admitting: Family Medicine

## 2019-09-09 ENCOUNTER — Telehealth (INDEPENDENT_AMBULATORY_CARE_PROVIDER_SITE_OTHER): Payer: Medicare Other | Admitting: Family Medicine

## 2019-09-09 ENCOUNTER — Encounter: Payer: Self-pay | Admitting: Family Medicine

## 2019-09-09 VITALS — BP 162/79 | HR 73 | Temp 97.3°F | Ht 68.0 in | Wt 224.0 lb

## 2019-09-09 DIAGNOSIS — J209 Acute bronchitis, unspecified: Secondary | ICD-10-CM

## 2019-09-09 DIAGNOSIS — J440 Chronic obstructive pulmonary disease with acute lower respiratory infection: Secondary | ICD-10-CM | POA: Diagnosis not present

## 2019-09-09 DIAGNOSIS — J439 Emphysema, unspecified: Secondary | ICD-10-CM | POA: Diagnosis not present

## 2019-09-09 DIAGNOSIS — R030 Elevated blood-pressure reading, without diagnosis of hypertension: Secondary | ICD-10-CM

## 2019-09-09 DIAGNOSIS — I10 Essential (primary) hypertension: Secondary | ICD-10-CM | POA: Insufficient documentation

## 2019-09-09 MED ORDER — CHERATUSSIN AC 100-10 MG/5ML PO SOLN: 5.0000 mL | Freq: Two times a day (BID) | ORAL | 0 refills | Status: DC | PRN

## 2019-09-09 MED ORDER — AZITHROMYCIN 250 MG PO TABS: ORAL_TABLET | ORAL | 0 refills | Status: DC

## 2019-09-09 NOTE — Telephone Encounter (Signed)
Can we see him now at 12:30pm?

## 2019-09-09 NOTE — Telephone Encounter (Signed)
Pt called and left a voicemail regarding his cough and congestion - requested something be called in.   Called and spoke with the patient and he c/o cough, chest congestion, sneezing and head congestion x 1 week +. Pt states that symptoms began almost 1.5 weeks ago with laryngitis which turned into a scratchy throat. Pt staets that a week later around 6/24 his symptoms progressed into head congestion which moved into this chest and the cough started. Cough is Productive but he is not sure what color mucous he is getting - mostly productive at night.  Pt denies fever, sore throat, chest tightness or SOB.  Taking Robitussin at night to help with cough; gets very little relief, only sleeps about 2-3 hrs per night d/t cough.  Pt states that he wakes up Clammy in the mornings - not sure if spiking a fever at night.   Requesting something be called into the pharmacy - [facility].   NO appts avail today with Dr Darnell Level - please advise if something  Can be sent in or if patient needs to see someone else in office.

## 2019-09-09 NOTE — Telephone Encounter (Signed)
Spoke with pt asking if he could be seen now, per Dr. Darnell Level, for virtual visit.  Pt agrees and added to schedule at 12:30.

## 2019-09-09 NOTE — Progress Notes (Signed)
Virtual visit completed through MyChart, a video enabled telemedicine application. Due to national recommendations of social distancing due to COVID-19, a virtual visit is felt to be most appropriate for this patient at this time. Reviewed limitations, risks, security and privacy concerns of performing a virtual visit and the availability of in person appointments. I also reviewed that there may be a patient responsible charge related to this service. The patient agreed to proceed.   Patient location: home Provider location:  at [facility], office Persons participating in this virtual visit: patient, provider, wife also present on call   If any vitals were documented, they were collected by patient at home unless specified below.    BP (!) 162/79   Pulse 73   Temp (!) 97.3 F (36.3 C)   Ht 5\' 8"  (1.727 m)   Wt 224 lb (101.6 kg)   BMI 34.06 kg/m   BP Readings from Last 3 Encounters:  09/09/19 (!) 162/79  05/10/19 132/78  10/26/18 118/76   CC: cough Subjective:    Patient ID: Ryan Mathews, male    DOB: [birth year], 74 y.o.   MRN: [id number]  HPI: JAHZIR STROHMEIER is a 74 y.o. male presenting on 09/09/2019 for Cough (C/o prod cough, chest congestion and sneezing.  Sxs started about 1 wk ago.  Cough is worse at night.  Tried Robitussin. )   1.5 wk h/o hoarse voice, with progression to scratchy throat and over weekend developed chest cold, cough. Increased cough productive of increased mucous, worse at night, associated with fatigue.   No fevers/chills, dyspnea, or wheezing, ear or tooth pain, loss of taste or smell. Good appetite.   Treating with robitussin (dextromethorphan and doxylamine) and OTC mucinex/guaifenesin. Cough syrup not suppressing cough - but is overly sedating with some dizziness.  No sick contacts at home.  No smokers at home.  No h/o asthma. H/o COPD but never needed breathing medication.  Ex smoker quit 2008.   Completed COVID Pfizer vaccine  04/2019.       Relevant past medical, surgical, family and social history reviewed and updated as indicated. Interim medical history since our last visit reviewed. Allergies and medications reviewed and updated. Outpatient Medications Prior to Visit  Medication Sig Dispense Refill  . b complex vitamins tablet Take 1 tablet by mouth daily.    Earney Navy Bicarbonate (ZEGERID OTC) 20-1100 MG CAPS capsule Take 1 capsule by mouth daily before breakfast. 28 each 0  . vitamin C (ASCORBIC ACID) 500 MG tablet Take 500 mg by mouth daily.     No facility-administered medications prior to visit.     Per HPI unless specifically indicated in ROS section below Review of Systems Objective:  BP (!) 162/79   Pulse 73   Temp (!) 97.3 F (36.3 C)   Ht 5\' 8"  (1.727 m)   Wt 224 lb (101.6 kg)   BMI 34.06 kg/m   Wt Readings from Last 3 Encounters:  09/09/19 224 lb (101.6 kg)  05/10/19 224 lb 7 oz (101.8 kg)  05/04/18 217 lb 4.8 oz (98.6 kg)       Physical exam: Gen: alert, NAD, not ill appearing Pulm: speaks in complete sentences without increased work of breathing Psych: normal mood, normal thought content      Assessment & Plan:   Problem List Items Addressed This Visit    Elevated blood pressure reading    No dx HTN.  Anticipate due to acute illness.  I asked him to monitor  BP once symptoms improved and let me know if consistently >140/90.      Bullous emphysema ([place])    H/o this on prior imaging.  Has not needed respiratory medication in the past.  Acute bronchitis today, last 10/2018, tends to get about once a year. If more frequent, consider preventative controller respiratory medication. Consider spirometry once lung function testing available again in the office.       Relevant Medications   azithromycin (ZITHROMAX) 250 MG tablet   guaiFENesin-codeine (CHERATUSSIN AC) 100-10 MG/5ML syrup   Acute bronchitis with COPD ([place]) - Primary    H/o mild COPD by symptoms,  significant emphysema on prior imaging. No wheezing, no need for prednisone at this time. Will cover with zpack antibiotic and codeine cough syrup for night time. Update if not improving with treatment. Red flags to seek further care reviewed.       Relevant Medications   azithromycin (ZITHROMAX) 250 MG tablet   guaiFENesin-codeine (CHERATUSSIN AC) 100-10 MG/5ML syrup       Meds ordered this encounter  Medications  . azithromycin (ZITHROMAX) 250 MG tablet    Sig: Take two tablets on day one followed by one tablet on days 2-5    Dispense:  6 each    Refill:  0  . guaiFENesin-codeine (CHERATUSSIN AC) 100-10 MG/5ML syrup    Sig: Take 5 mLs by mouth 2 (two) times daily as needed for cough (sedation precautions).    Dispense:  120 mL    Refill:  0   No orders of the defined types were placed in this encounter.   I discussed the assessment and treatment plan with the patient. The patient was provided an opportunity to ask questions and all were answered. The patient agreed with the plan and demonstrated an understanding of the instructions. The patient was advised to call back or seek an in-person evaluation if the symptoms worsen or if the condition fails to improve as anticipated.  Follow up plan: No follow-ups on file.  Ria Bush, MD

## 2019-09-09 NOTE — Assessment & Plan Note (Signed)
No dx HTN.  Anticipate due to acute illness.  I asked him to monitor BP once symptoms improved and let me know if consistently >140/90.

## 2019-09-09 NOTE — Assessment & Plan Note (Signed)
H/o mild COPD by symptoms, significant emphysema on prior imaging. No wheezing, no need for prednisone at this time. Will cover with zpack antibiotic and codeine cough syrup for night time. Update if not improving with treatment. Red flags to seek further care reviewed.

## 2019-09-09 NOTE — Assessment & Plan Note (Signed)
H/o this on prior imaging.  Has not needed respiratory medication in the past.  Acute bronchitis today, last 10/2018, tends to get about once a year. If more frequent, consider preventative controller respiratory medication. Consider spirometry once lung function testing available again in the office.

## 2019-10-25 DIAGNOSIS — L309 Dermatitis, unspecified: Secondary | ICD-10-CM | POA: Diagnosis not present

## 2019-10-25 DIAGNOSIS — L821 Other seborrheic keratosis: Secondary | ICD-10-CM | POA: Diagnosis not present

## 2019-10-25 DIAGNOSIS — L820 Inflamed seborrheic keratosis: Secondary | ICD-10-CM | POA: Diagnosis not present

## 2019-11-13 ENCOUNTER — Telehealth: Payer: Self-pay

## 2019-11-13 NOTE — Telephone Encounter (Signed)
[place] Day - Client TELEPHONE ADVICE RECORD [facility] Patient Name: Ryan Mathews Gender: Male DOB: 1945/05/23 Age: 74 Y 62 M 25 D Return Phone Number: [phone] (Primary) Address: City/State/Zip: [address] [address] Client [facility] - Client Client Site [place] Physician Ria Bush - MD Contact Type Call Who Is Calling Patient / Member / Family / Caregiver Call Type Triage / Clinical Relationship To Patient Self Return Phone Number [phone] (Primary) Chief Complaint Walking difficulty Reason for Call Symptomatic / Request for [name] states he bent over and something popped in his back. Now he can barely walk. Translation No Nurse Assessment Nurse: Zorita Pang, RN, Deborah Date/Time (Eastern Time): 11/13/2019 10:49:58 AM Confirm and document reason for call. If symptomatic, describe symptoms. ---The caller states that he bent over and he felt something pop in his back. He was not able to stand but can now. Pain is tolerable as he has taken ibuprofen 800 mg Has the patient had close contact with a person known or suspected to have the novel coronavirus illness OR traveled / lives in area with major community spread (including international travel) in the last 14 days from the onset of symptoms? * If Asymptomatic, screen for exposure and travel within the last 14 days. ---No Does the patient have any new or worsening symptoms? ---Yes Will a triage be completed? ---Yes Related visit to physician within the last 2 weeks? ---No Does the PT have any chronic conditions? (i.e. diabetes, asthma, this includes High risk factors for pregnancy, etc.) ---Yes Is this a behavioral health or substance abuse call? ---No Guidelines Guideline Title Affirmed Question Affirmed Notes Nurse Date/Time (Eastern Time) Back Pain Caused by a twisting, bending, or  lifting injury Womble, RN, Neoma Laming 11/13/2019 10:53:46 AM Disp. Time Eilene Ghazi Time) Disposition Final User 11/13/2019 11:04:54 AM Home Care Yes Womble, RN, Deborah PLEASE NOTE: All timestamps contained within this report are represented as [place] Standard Time. CONFIDENTIALTY NOTICE: This fax transmission is intended only for the addressee. It contains information that is legally privileged, confidential or otherwise protected from use or disclosure. If you are not the intended recipient, you are strictly prohibited from reviewing, disclosing, copying using or disseminating any of this information or taking any action in reliance on or regarding this information. If you have received this fax in error, please notify us immediately by telephone so that we can arrange for its return to [place]. Phone: [phone], Toll-Free: [phone], Fax: [phone] Page: 2 of 2 Call Id: [id number] [place] Disagree/Comply Comply Caller Understands Yes PreDisposition Home Care Care Advice Given Per Guideline CALL BACK IF: * You become worse. LOCAL COLD OR HEAT: * IBUPROFEN (E.G., MOTRIN, ADVIL): Take 400 mg (two 200 mg pills) by mouth every 6 hours. The most you should take each day is 1,200 mg (six 200 mg pills), unless your doctor has told you to take more. * Numbness or weakness occurs, or bowel/bladder problems * Pain begins to shoot into the leg * Pain becomes worse Comments User: [name], RN Date/Time Eilene Ghazi Time): 11/13/2019 11:07:19 AM This nurse called the back line and got no answer. Called the main line and was on hold for greater than 4 minutes. The caller was given the outcome of home care but this nurse told him that she would ask if he can come to the office just to get a lower back x-ray as he is requesting. He states that if  it is muscular he can deal with it but he doesn't want to damage his back if it is in the spine. He verbalized understanding of instructions that he was given.

## 2019-11-13 NOTE — Telephone Encounter (Signed)
Noted  

## 2019-11-13 NOTE — Telephone Encounter (Signed)
I spoke with pt; pt cannot bend over and when stands difficult to walk due to back pain (pain level 8). Pt cannot stand up straight. Pt is still at work. Pt has no covid symptoms, no travel and no known exposure to + covid. Pt has had covid vaccinations. Pt does not want to go to UC or ED. No available appts at [facility] today. Pt scheduled appt 11/14/19 at 10:40 with Dr Diona Browner. UC & ED precautions given and pt voiced understanding. FYI to Dr Diona Browner and Dr Darnell Level as PCP.

## 2019-11-14 ENCOUNTER — Encounter: Payer: Self-pay | Admitting: Family Medicine

## 2019-11-14 ENCOUNTER — Ambulatory Visit (INDEPENDENT_AMBULATORY_CARE_PROVIDER_SITE_OTHER): Payer: Medicare Other | Admitting: Family Medicine

## 2019-11-14 ENCOUNTER — Other Ambulatory Visit: Payer: Self-pay

## 2019-11-14 VITALS — BP 150/86 | HR 57 | Temp 97.9°F | Ht 68.0 in | Wt 226.8 lb

## 2019-11-14 DIAGNOSIS — S39012A Strain of muscle, fascia and tendon of lower back, initial encounter: Secondary | ICD-10-CM | POA: Insufficient documentation

## 2019-11-14 MED ORDER — CYCLOBENZAPRINE HCL 10 MG PO TABS: 10.0000 mg | ORAL_TABLET | Freq: Every evening | ORAL | 0 refills | Status: DC | PRN

## 2019-11-14 MED ORDER — DICLOFENAC SODIUM 75 MG PO TBEC: 75.0000 mg | DELAYED_RELEASE_TABLET | Freq: Two times a day (BID) | ORAL | 0 refills | Status: DC

## 2019-11-14 NOTE — Assessment & Plan Note (Signed)
Start low back stretches.  Start heat on low back.  Hold any ibuprofen or aleve.  Start diclofenac twice daily.  Start muscle relaxant at night.  Call if not improving as expected in 2 weeks.

## 2019-11-14 NOTE — Progress Notes (Signed)
Chief Complaint  Patient presents with  . Back Pain    C/o low back pain, worse on left. Has constant throbbing and occasional shooting pain. Started yesterday.  Bent over to pick up an item and felt a "pop".  Tried ibuprofen 800 mg, helpful.     History of Present Illness: HPI   74 year old male patient presents for new onset back pain in last 24 hours.. started when he bent over.  Heard a pop and felt sharp pain.. bilateral low back pain.  Now constant central low back pain.. worse with movement.Marland Kitchen leaning forward.   No pain radiating to legs. No numbness, no weakness.  no incontinences.  no fever.   Ibuprofen 800 mg helped some, temporarily. Muscle relaxer helped some last night.    No history of chronic back issues... but has had occ back pain in past.  No surgeries.      This visit occurred during the SARS-CoV-2 public health emergency.  Safety protocols were in place, including screening questions prior to the visit, additional usage of staff PPE, and extensive cleaning of exam room while observing appropriate contact time as indicated for disinfecting solutions.   COVID 19 screen:  No recent travel or known exposure to COVID19 The patient denies respiratory symptoms of COVID 19 at this time. The importance of social distancing was discussed today.     Review of Systems  Constitutional: Negative for chills and fever.  HENT: Negative for congestion and ear pain.   Eyes: Negative for pain and redness.  Respiratory: Negative for cough and shortness of breath.   Cardiovascular: Negative for chest pain, palpitations and leg swelling.  Gastrointestinal: Negative for abdominal pain, blood in stool, constipation, diarrhea, nausea and vomiting.  Genitourinary: Negative for dysuria.  Musculoskeletal: Positive for back pain. Negative for falls and myalgias.  Skin: Negative for rash.  Neurological: Negative for dizziness.  Psychiatric/Behavioral: Negative for depression. The  patient is not nervous/anxious.       Past Medical History:  Diagnosis Date  . Bilateral inguinal hernia (BIH) s/p lap repair 09/06/2012 08/09/2012  . Cataract   . CMC arthritis, thumb, degenerative    right  . COPD (chronic obstructive pulmonary disease) ([place])   . Emphysema of lung ([place])   . Ex-smoker   . GERD (gastroesophageal reflux disease)   . History of chicken pox   . History of pneumonia 08/06/2009       . Nasal septal deviation 04/14/2017   Marked  . Numbness    R cheek stays numb  . Obesity (BMI 30-39.9) 08/09/2012    reports that he quit smoking about 13 years ago. His smoking use included cigarettes. He started smoking about 51 years ago. He has a 80.00 pack-year smoking history. He has never used smokeless tobacco. He reports current alcohol use. He reports that he does not use drugs.   Current Outpatient Medications:  .  b complex vitamins tablet, Take 1 tablet by mouth daily., Disp: , Rfl:  .  Omeprazole-Sodium Bicarbonate (ZEGERID OTC) 20-1100 MG CAPS capsule, Take 1 capsule by mouth daily before breakfast., Disp: 28 each, Rfl: 0 .  vitamin C (ASCORBIC ACID) 500 MG tablet, Take 500 mg by mouth daily., Disp: , Rfl:    Observations/Objective: Blood pressure (!) 150/86, pulse (!) 57, temperature 97.9 F (36.6 C), temperature source Temporal, height 5\' 8"  (1.727 m), weight 226 lb 12 oz (102.9 kg), SpO2 97 %.  Physical Exam Constitutional:      Appearance: He  is well-developed.  HENT:     Head: Normocephalic.     Right Ear: Hearing normal.     Left Ear: Hearing normal.     Nose: Nose normal.  Neck:     Thyroid: No thyroid mass or thyromegaly.     Vascular: No carotid bruit.     Trachea: Trachea normal.  Cardiovascular:     Rate and Rhythm: Normal rate and regular rhythm.     Pulses: Normal pulses.     Heart sounds: Heart sounds not distant. No murmur heard.  No friction rub. No gallop.      Comments: No peripheral edema Pulmonary:     Effort: Pulmonary effort  is normal. No respiratory distress.     Breath sounds: Normal breath sounds.  Musculoskeletal:     Cervical back: Normal.     Thoracic back: Normal.     Lumbar back: Spasms and tenderness present. No bony tenderness. Decreased range of motion. Negative right straight leg raise test and negative left straight leg raise test.     Comments: ttp over right paraspinous muscle complex  Skin:    General: Skin is warm and dry.     Findings: No rash.  Neurological:     Cranial Nerves: Cranial nerves are intact.     Sensory: Sensation is intact.     Motor: Motor function is intact.     Coordination: Coordination is intact.     Gait: Gait abnormal.  Psychiatric:        Speech: Speech normal.        Behavior: Behavior normal.        Thought Content: Thought content normal.      Assessment and Plan   Low back strain, initial encounter Start low back stretches.  Start heat on low back.  Hold any ibuprofen or aleve.  Start diclofenac twice daily.  Start muscle relaxant at night.  Call if not improving as expected in 2 weeks.       Eliezer Lofts, MD

## 2019-11-14 NOTE — Patient Instructions (Signed)
Start low back stretches.  Start heat on low back.  Hold any ibuprofen or aleve.  Start diclofenac twice daily.  Start muscle relaxant at night.  Call if not improving as expected in 2 weeks.

## 2019-11-29 DIAGNOSIS — L814 Other melanin hyperpigmentation: Secondary | ICD-10-CM | POA: Diagnosis not present

## 2019-11-29 DIAGNOSIS — D0471 Carcinoma in situ of skin of right lower limb, including hip: Secondary | ICD-10-CM | POA: Diagnosis not present

## 2019-11-29 DIAGNOSIS — D485 Neoplasm of uncertain behavior of skin: Secondary | ICD-10-CM | POA: Diagnosis not present

## 2019-11-29 DIAGNOSIS — L719 Rosacea, unspecified: Secondary | ICD-10-CM | POA: Diagnosis not present

## 2019-11-29 DIAGNOSIS — Z23 Encounter for immunization: Secondary | ICD-10-CM | POA: Diagnosis not present

## 2019-11-29 DIAGNOSIS — I878 Other specified disorders of veins: Secondary | ICD-10-CM | POA: Diagnosis not present

## 2019-11-29 DIAGNOSIS — L578 Other skin changes due to chronic exposure to nonionizing radiation: Secondary | ICD-10-CM | POA: Diagnosis not present

## 2019-11-29 DIAGNOSIS — D1801 Hemangioma of skin and subcutaneous tissue: Secondary | ICD-10-CM | POA: Diagnosis not present

## 2019-11-29 DIAGNOSIS — L821 Other seborrheic keratosis: Secondary | ICD-10-CM | POA: Diagnosis not present

## 2019-12-09 ENCOUNTER — Other Ambulatory Visit: Payer: Self-pay

## 2019-12-09 ENCOUNTER — Encounter: Payer: Self-pay | Admitting: Family Medicine

## 2019-12-09 ENCOUNTER — Ambulatory Visit (INDEPENDENT_AMBULATORY_CARE_PROVIDER_SITE_OTHER): Payer: Medicare Other | Admitting: Family Medicine

## 2019-12-09 VITALS — BP 160/100 | HR 73 | Temp 98.3°F | Ht 68.0 in | Wt 225.2 lb

## 2019-12-09 DIAGNOSIS — M542 Cervicalgia: Secondary | ICD-10-CM | POA: Diagnosis not present

## 2019-12-09 MED ORDER — PREDNISONE 20 MG PO TABS: ORAL_TABLET | ORAL | 0 refills | Status: DC

## 2019-12-09 MED ORDER — CYCLOBENZAPRINE HCL 10 MG PO TABS: 10.0000 mg | ORAL_TABLET | Freq: Every evening | ORAL | 0 refills | Status: DC | PRN

## 2019-12-09 NOTE — Progress Notes (Signed)
Tyisha Cressy T. Evvie Behrmann, MD, [place]  [facility] and [place] at [facility] [place], [address]  Phone: [phone]  FAX: [place] - 74 y.o. male  MRN [id number]  Date of Birth: 21-Apr-1945  Date: 12/09/2019  PCP: Ria Bush, MD  Referral: Ria Bush, MD  Chief Complaint  Patient presents with  . Neck Pain    This visit occurred during the SARS-CoV-2 public health emergency.  Safety protocols were in place, including screening questions prior to the visit, additional usage of staff PPE, and extensive cleaning of exam room while observing appropriate contact time as indicated for disinfecting solutions.   Subjective:   Ryan Mathews is a 74 y.o. very pleasant male patient with Body mass index is 34.25 kg/m. who presents with the following:  Friday night, thought that his neck was not feeling.  Feeling ok until Sunday morning and it was really sore both sides.  Progressed and it has worsened and cannot turn at all.  He did not have any kind of specific injury at all that he knows.  He does not have any radiculopathy or numbness.  No significant prior injuries, no surgery or other interventions in the past.  He does have remarkably restricted motion.  Just in the back of his neck.   Motrin 600 mg  Works with [employer].     Review of Systems is noted in the HPI, as appropriate   Objective:   BP (!) 160/100   Pulse 73   Temp 98.3 F (36.8 C) (Temporal)   Ht 5\' 8"  (1.727 m)   Wt 225 lb 4 oz (102.2 kg)   SpO2 95%   BMI 34.25 kg/m    GEN: Well-developed,well-nourished,in no acute distress; alert,appropriate and cooperative throughout examination HEENT: Normocephalic and atraumatic without obvious abnormalities. Ears, externally no deformities PULM: Breathing comfortably in no respiratory distress EXT: No clubbing, cyanosis, or edema PSYCH: Normally  interactive. Cooperative during the interview. Pleasant. Friendly and conversant. Not anxious or depressed appearing. Normal, full affect.  CERVICAL SPINE EXAM Range of motion: Flexion, extension, lateral bending, and rotation: He is limited to about 60% loss of motion in all directions Pain with terminal motion: Yes Spinous Processes: NT SCM: NT Upper paracervical muscles: Quite tender posteriorly Upper traps: NT C5-T1 intact, sensation and motor   Radiology: No results found.  Assessment and Plan:     ICD-10-CM   1. Cervicalgia  M54.2    No cervicalgia, unclear prognosis.  For now, range of motion, steroids, Flexeril at night.  Without improvement, PT, then intervention could be considered if no improvement.  Meds ordered this encounter  Medications  . predniSONE (DELTASONE) 20 MG tablet    Sig: 2 tabs po daily for 5 days, then 1 tab po daily for 5 days    Dispense:  15 tablet    Refill:  0  . cyclobenzaprine (FLEXERIL) 10 MG tablet    Sig: Take 1 tablet (10 mg total) by mouth at bedtime as needed for muscle spasms.    Dispense:  30 tablet    Refill:  0   Medications Discontinued During This Encounter  Medication Reason  . cyclobenzaprine (FLEXERIL) 10 MG tablet Reorder   No orders of the defined types were placed in this encounter.   Follow-up: No follow-ups on file.  Signed,  Maud Deed. Alyas Creary, MD   Outpatient Encounter Medications as of 12/09/2019  Medication Sig  .  b complex vitamins tablet Take 1 tablet by mouth daily.  . cyclobenzaprine (FLEXERIL) 10 MG tablet Take 1 tablet (10 mg total) by mouth at bedtime as needed for muscle spasms.  . diclofenac (VOLTAREN) 75 MG EC tablet Take 1 tablet (75 mg total) by mouth 2 (two) times daily.  Earney Navy Bicarbonate (ZEGERID OTC) 20-1100 MG CAPS capsule Take 1 capsule by mouth daily before breakfast.  . vitamin C (ASCORBIC ACID) 500 MG tablet Take 500 mg by mouth daily.  . [DISCONTINUED] cyclobenzaprine  (FLEXERIL) 10 MG tablet Take 1 tablet (10 mg total) by mouth at bedtime as needed for muscle spasms.  . predniSONE (DELTASONE) 20 MG tablet 2 tabs po daily for 5 days, then 1 tab po daily for 5 days   No facility-administered encounter medications on file as of 12/09/2019.

## 2019-12-19 DIAGNOSIS — Z23 Encounter for immunization: Secondary | ICD-10-CM | POA: Diagnosis not present

## 2019-12-20 ENCOUNTER — Other Ambulatory Visit: Payer: Self-pay

## 2019-12-20 ENCOUNTER — Ambulatory Visit
Admission: RE | Admit: 2019-12-20 | Discharge: 2019-12-20 | Disposition: A | Discharge status: Home / Self Care | Payer: Medicare Other | Source: Ambulatory Visit | Attending: Acute Care | Admitting: Acute Care

## 2019-12-20 DIAGNOSIS — Z87891 Personal history of nicotine dependence: Secondary | ICD-10-CM | POA: Diagnosis not present

## 2019-12-20 DIAGNOSIS — Z122 Encounter for screening for malignant neoplasm of respiratory organs: Secondary | ICD-10-CM

## 2019-12-20 DIAGNOSIS — K449 Diaphragmatic hernia without obstruction or gangrene: Secondary | ICD-10-CM | POA: Diagnosis not present

## 2019-12-20 DIAGNOSIS — J432 Centrilobular emphysema: Secondary | ICD-10-CM | POA: Diagnosis not present

## 2019-12-20 DIAGNOSIS — I700 Atherosclerosis of aorta: Secondary | ICD-10-CM | POA: Diagnosis not present

## 2019-12-26 NOTE — Progress Notes (Signed)
Please call patient and let them  know their  low dose Ct was read as a Lung RADS 2: nodules that are benign in appearance and behavior with a very low likelihood of becoming a clinically active cancer due to size or lack of growth. Recommendation per radiology is for a repeat LDCT in 12 months. .Please let them  know we will order and schedule their  annual screening scan for 12/2020. Please let them  know there was notation of CAD on their  scan.  Please remind the patient  that this is a non-gated exam therefore degree or severity of disease  cannot be determined. Please have them  follow up with their PCP regarding potential risk factor modification, dietary therapy or pharmacologic therapy if clinically indicated. Pt.  is not  currently on statin therapy. Please place order for annual  screening scan for  10/202 and fax results to PCP. Thanks so much.  Please let patient know thee was notation of Hepatic steatosis. This  is a term that describes the build up of fat in the liver. It is normal to have small amounts of fat in your liver, but when the proportion of liver cells that contain fat exceeds more than 5% it is indicative of early stage fatty liver.Treatment often involves reducing risk factors through a diet and exercise plan. It is generally a benign condition, but in a small percentage of patients it does require follow up. Please have the patient follow up with PCP regarding potential risk factor modification, dietary therapy or pharmacologic therapy if clinically indicated.

## 2019-12-30 ENCOUNTER — Other Ambulatory Visit: Payer: Self-pay | Admitting: *Deleted

## 2019-12-30 DIAGNOSIS — Z87891 Personal history of nicotine dependence: Secondary | ICD-10-CM

## 2020-04-24 ENCOUNTER — Other Ambulatory Visit: Payer: Self-pay

## 2020-04-24 ENCOUNTER — Ambulatory Visit (INDEPENDENT_AMBULATORY_CARE_PROVIDER_SITE_OTHER)
Admission: RE | Admit: 2020-04-24 | Discharge: 2020-04-24 | Disposition: A | Discharge status: Home / Self Care | Payer: Medicare Other | Source: Ambulatory Visit | Attending: Family Medicine | Admitting: Family Medicine

## 2020-04-24 ENCOUNTER — Ambulatory Visit (INDEPENDENT_AMBULATORY_CARE_PROVIDER_SITE_OTHER): Payer: Medicare Other | Admitting: Family Medicine

## 2020-04-24 ENCOUNTER — Encounter: Payer: Self-pay | Admitting: Family Medicine

## 2020-04-24 VITALS — BP 140/82 | HR 83 | Temp 98.0°F | Ht 68.0 in | Wt 225.5 lb

## 2020-04-24 DIAGNOSIS — M5442 Lumbago with sciatica, left side: Secondary | ICD-10-CM

## 2020-04-24 DIAGNOSIS — M25552 Pain in left hip: Secondary | ICD-10-CM | POA: Diagnosis not present

## 2020-04-24 DIAGNOSIS — M545 Low back pain, unspecified: Secondary | ICD-10-CM | POA: Diagnosis not present

## 2020-04-24 MED ORDER — CYCLOBENZAPRINE HCL 10 MG PO TABS: 10.0000 mg | ORAL_TABLET | Freq: Every evening | ORAL | 0 refills | Status: DC | PRN

## 2020-04-24 MED ORDER — PREDNISONE 20 MG PO TABS: ORAL_TABLET | ORAL | 0 refills | Status: DC

## 2020-04-24 MED ORDER — TRAMADOL HCL 50 MG PO TABS: 50.0000 mg | ORAL_TABLET | Freq: Three times a day (TID) | ORAL | 0 refills | Status: AC | PRN

## 2020-04-24 NOTE — Patient Instructions (Signed)
You have left sciatica as well as possible hip bursitis from favoring that side for over a month. Xrays today.  Treat with prednisone taper sent to pharmacy, flexeril muscle relaxant refilled.  May use tramadol for breakthrough pain despite prednisone.  Let us know how you do with this treatment. If ongoing pain despite treatment, we may proceed with lumbar MRI to further evaluate for herniated disc.

## 2020-04-24 NOTE — Assessment & Plan Note (Addendum)
1+ month duration of left lower back pain with radiation down left leg to knee suspicious for L sciatica ?HNP. Exam also with evidence of trochanteric bursitis on left and ?hip pathology (arthritis). Given acutely worsening pain, check lumbar films as well as L hip films.  Rx prednisone course, refill flexeril, tramadol for breakthrough pain. Update with effect. If no improvement, low threshold for MRI to further evaluate for lumbar HNP.  Provided with exercises for bursitis and sciatica Pt agrees with plan.

## 2020-04-24 NOTE — Progress Notes (Signed)
Patient ID: Ryan Mathews, male    DOB: 25-Jun-1945, 75 y.o.   MRN: [id number]  This visit was conducted in person.  BP 140/82   Pulse 83   Temp 98 F (36.7 C) (Temporal)   Ht 5\' 8"  (1.727 m)   Wt 225 lb 8 oz (102.3 kg)   SpO2 95%   BMI 34.29 kg/m    CC: lower back pain  Subjective:   HPI: Ryan Mathews is a 75 y.o. male presenting on 04/24/2020 for Back Pain (C/o low back pain due to injury about 5 mos ago.  Seen and treated but pain has worsened.  Starts in lower left side and radiates down left leg to knee. )   1 mo h/o left lower back pain into buttock and yesterday with new radiation down into L groin and into left knee. Trouble getting out of the bed this morning. Started while working on front yard around Christmas moving small rocks. Pain progressively worsening. Had to miss work this morning.   Denies inciting trauma/injury or falls to start this.  No fevers/chills, numbness or paresthesias down leg, saddle anesthesia, no bowel/bladder accidents.   Treating with ibuprofen, flexeril with limited benefit. Also tried wife's pain pill which didn't help either.   [employer], knows how to lift properly.      Relevant past medical, surgical, family and social history reviewed and updated as indicated. Interim medical history since our last visit reviewed. Allergies and medications reviewed and updated. Outpatient Medications Prior to Visit  Medication Sig Dispense Refill  . b complex vitamins tablet Take 1 tablet by mouth daily.    . Omega-3 Fatty Acids (FISH OIL) 1000 MG CAPS Take by mouth in the morning and at bedtime.    Earney [place] Bicarbonate (ZEGERID OTC) 20-1100 MG CAPS capsule Take 1 capsule by mouth daily before breakfast. 28 each 0  . vitamin C (ASCORBIC ACID) 500 MG tablet Take 500 mg by mouth daily.    . cyclobenzaprine (FLEXERIL) 10 MG tablet Take 1 tablet (10 mg total) by mouth at bedtime as needed for muscle spasms. 30 tablet 0  .  diclofenac (VOLTAREN) 75 MG EC tablet Take 1 tablet (75 mg total) by mouth 2 (two) times daily. 30 tablet 0  . predniSONE (DELTASONE) 20 MG tablet 2 tabs po daily for 5 days, then 1 tab po daily for 5 days 15 tablet 0   No facility-administered medications prior to visit.     Per HPI unless specifically indicated in ROS section below Review of Systems Objective:  BP 140/82   Pulse 83   Temp 98 F (36.7 C) (Temporal)   Ht 5\' 8"  (1.727 m)   Wt 225 lb 8 oz (102.3 kg)   SpO2 95%   BMI 34.29 kg/m   Wt Readings from Last 3 Encounters:  04/24/20 225 lb 8 oz (102.3 kg)  12/09/19 225 lb 4 oz (102.2 kg)  11/14/19 226 lb 12 oz (102.9 kg)      Physical Exam Vitals and nursing note reviewed.  Constitutional:      Appearance: Normal appearance. He is not ill-appearing.     Comments: Antalgic gait, ambulates with assistance of walker  Musculoskeletal:        General: Tenderness present. Normal range of motion.     Right lower leg: No edema.     Left lower leg: No edema.     Comments:  Discomfort midline mid lumbar spine + L lumbar paraspinous  mm tenderness Pain with SLR bilaterally, L>R. Discomfort with int/ext rotation at hip. No pain at SIJ, or sciatic notch bilaterally. Discomfort at L GTB  Skin:    General: Skin is warm and dry.     Findings: No rash.  Neurological:     Mental Status: He is alert.     Sensory: Sensation is intact.     Deep Tendon Reflexes:     Reflex Scores:      Patellar reflexes are 2+ on the right side and 2+ on the left side.      Achilles reflexes are 2+ on the right side and 2+ on the left side.    Comments: Limited strength on testing LLE due to pain   Psychiatric:        Mood and Affect: Mood normal.        Behavior: Behavior normal.       Assessment & Plan:  This visit occurred during the SARS-CoV-2 public health emergency.  Safety protocols were in place, including screening questions prior to the visit, additional usage of staff PPE, and  extensive cleaning of exam room while observing appropriate contact time as indicated for disinfecting solutions.   Problem List Items Addressed This Visit    Acute left-sided low back pain with left-sided sciatica - Primary    1+ month duration of left lower back pain with radiation down left leg to knee suspicious for L sciatica ?HNP. Exam also with evidence of trochanteric bursitis on left and ?hip pathology (arthritis). Given acutely worsening pain, check lumbar films as well as L hip films.  Rx prednisone course, refill flexeril, tramadol for breakthrough pain. Update with effect. If no improvement, low threshold for MRI to further evaluate for lumbar HNP.  Provided with exercises for bursitis and sciatica Pt agrees with plan.       Relevant Medications   predniSONE (DELTASONE) 20 MG tablet   cyclobenzaprine (FLEXERIL) 10 MG tablet   traMADol (ULTRAM) 50 MG tablet   Other Relevant Orders   DG Hip Unilat W OR W/O Pelvis 2-3 Views Left   DG Lumbar Spine Complete       Meds ordered this encounter  Medications  . predniSONE (DELTASONE) 20 MG tablet    Sig: Take two tablets daily for 4 days followed by one tablet daily for 4 days    Dispense:  12 tablet    Refill:  0  . cyclobenzaprine (FLEXERIL) 10 MG tablet    Sig: Take 1 tablet (10 mg total) by mouth at bedtime as needed for muscle spasms.    Dispense:  30 tablet    Refill:  0  . traMADol (ULTRAM) 50 MG tablet    Sig: Take 1 tablet (50 mg total) by mouth every 8 (eight) hours as needed for up to 5 days.    Dispense:  15 tablet    Refill:  0   Orders Placed This Encounter  Procedures  . DG Hip Unilat W OR W/O Pelvis 2-3 Views Left    Standing Status:   Future    Number of Occurrences:   1    Standing Expiration Date:   04/24/2021    Order Specific Question:   Reason for Exam (SYMPTOM  OR DIAGNOSIS REQUIRED)    Answer:   L hip pain with ROM    Order Specific Question:   Preferred imaging location?    Answer:   [facility]  . DG Lumbar Spine Complete  Standing Status:   Future    Number of Occurrences:   1    Standing Expiration Date:   04/24/2021    Order Specific Question:   Reason for Exam (SYMPTOM  OR DIAGNOSIS REQUIRED)    Answer:   lower back pain with L radiculopathy x22mo, worsening    Order Specific Question:   Preferred imaging location?    Answer:   [facility]    Patient Instructions  You have left sciatica as well as possible hip bursitis from favoring that side for over a month. Xrays today.  Treat with prednisone taper sent to pharmacy, flexeril muscle relaxant refilled.  May use tramadol for breakthrough pain despite prednisone.  Let us know how you do with this treatment. If ongoing pain despite treatment, we may proceed with lumbar MRI to further evaluate for herniated disc.    Follow up plan: Return if symptoms worsen or fail to improve.  Ria Bush, MD

## 2020-04-28 DIAGNOSIS — H5203 Hypermetropia, bilateral: Secondary | ICD-10-CM | POA: Diagnosis not present

## 2020-04-28 DIAGNOSIS — H2513 Age-related nuclear cataract, bilateral: Secondary | ICD-10-CM | POA: Diagnosis not present

## 2020-04-28 DIAGNOSIS — H52203 Unspecified astigmatism, bilateral: Secondary | ICD-10-CM | POA: Diagnosis not present

## 2020-05-05 ENCOUNTER — Other Ambulatory Visit: Payer: Self-pay | Admitting: Family Medicine

## 2020-05-05 DIAGNOSIS — K760 Fatty (change of) liver, not elsewhere classified: Secondary | ICD-10-CM

## 2020-05-05 DIAGNOSIS — E785 Hyperlipidemia, unspecified: Secondary | ICD-10-CM

## 2020-05-05 DIAGNOSIS — Z125 Encounter for screening for malignant neoplasm of prostate: Secondary | ICD-10-CM

## 2020-05-06 ENCOUNTER — Other Ambulatory Visit: Payer: Self-pay

## 2020-05-06 ENCOUNTER — Other Ambulatory Visit (INDEPENDENT_AMBULATORY_CARE_PROVIDER_SITE_OTHER): Payer: Medicare Other

## 2020-05-06 DIAGNOSIS — Z125 Encounter for screening for malignant neoplasm of prostate: Secondary | ICD-10-CM

## 2020-05-06 DIAGNOSIS — K760 Fatty (change of) liver, not elsewhere classified: Secondary | ICD-10-CM | POA: Diagnosis not present

## 2020-05-06 DIAGNOSIS — E785 Hyperlipidemia, unspecified: Secondary | ICD-10-CM

## 2020-05-06 LAB — CBC WITH DIFFERENTIAL/PLATELET
Basophils Absolute: 0.1 10*3/uL (ref 0.0–0.1)
Basophils Relative: 0.6 % (ref 0.0–3.0)
Eosinophils Absolute: 0.3 10*3/uL (ref 0.0–0.7)
Eosinophils Relative: 2.9 % (ref 0.0–5.0)
HCT: 49.9 % (ref 39.0–52.0)
Hemoglobin: 17.1 g/dL — ABNORMAL HIGH (ref 13.0–17.0)
Lymphocytes Relative: 14.1 % (ref 12.0–46.0)
Lymphs Abs: 1.6 10*3/uL (ref 0.7–4.0)
MCHC: 34.4 g/dL (ref 30.0–36.0)
MCV: 94.5 fl (ref 78.0–100.0)
Monocytes Absolute: 1 10*3/uL (ref 0.1–1.0)
Monocytes Relative: 8.4 % (ref 3.0–12.0)
Neutro Abs: 8.5 10*3/uL — ABNORMAL HIGH (ref 1.4–7.7)
Neutrophils Relative %: 74 % (ref 43.0–77.0)
Platelets: 183 10*3/uL (ref 150.0–400.0)
RBC: 5.28 Mil/uL (ref 4.22–5.81)
RDW: 13.2 % (ref 11.5–15.5)
WBC: 11.5 10*3/uL — ABNORMAL HIGH (ref 4.0–10.5)

## 2020-05-06 LAB — COMPREHENSIVE METABOLIC PANEL
ALT: 30 U/L (ref 0–53)
AST: 25 U/L (ref 0–37)
Albumin: 3.9 g/dL (ref 3.5–5.2)
Alkaline Phosphatase: 67 U/L (ref 39–117)
BUN: 22 mg/dL (ref 6–23)
CO2: 29 mEq/L (ref 19–32)
Calcium: 9.2 mg/dL (ref 8.4–10.5)
Chloride: 102 mEq/L (ref 96–112)
Creatinine, Ser: 1.15 mg/dL (ref 0.40–1.50)
GFR: 63.31 mL/min (ref >60.00)
Glucose, Bld: 141 mg/dL — ABNORMAL HIGH (ref 70–99)
Potassium: 4.4 mEq/L (ref 3.5–5.1)
Sodium: 138 mEq/L (ref 135–145)
Total Bilirubin: 0.6 mg/dL (ref 0.2–1.2)
Total Protein: 6.8 g/dL (ref 6.0–8.3)

## 2020-05-06 LAB — LIPID PANEL
Cholesterol: 137 mg/dL (ref 0–200)
HDL: 35.1 mg/dL — ABNORMAL LOW (ref >39.00)
LDL Cholesterol: 77 mg/dL (ref 0–99)
NonHDL: 102.19
Total CHOL/HDL Ratio: 4
Triglycerides: 124 mg/dL (ref 0.0–149.0)
VLDL: 24.8 mg/dL (ref 0.0–40.0)

## 2020-05-06 LAB — PSA, MEDICARE: PSA: 0.84 ng/ml (ref 0.10–4.00)

## 2020-05-08 ENCOUNTER — Ambulatory Visit (INDEPENDENT_AMBULATORY_CARE_PROVIDER_SITE_OTHER): Payer: Medicare Other

## 2020-05-08 ENCOUNTER — Other Ambulatory Visit: Payer: Self-pay

## 2020-05-08 DIAGNOSIS — Z0000 Encounter for general adult medical examination without abnormal findings: Secondary | ICD-10-CM

## 2020-05-08 NOTE — Progress Notes (Signed)
Subjective:   Ryan Mathews is a 75 y.o. male who presents for Medicare Annual/Subsequent preventive examination.  Review of Systems: N/A      I connected with the patient today by telephone and verified that I am speaking with the correct person using two identifiers. Location patient: home Location nurse: work Persons participating in the telephone visit: patient, nurse.   I discussed the limitations, risks, security and privacy concerns of performing an evaluation and management service by telephone and the availability of in person appointments. I also discussed with the patient that there may be a patient responsible charge related to this service. The patient expressed understanding and verbally consented to this telephonic visit.        Cardiac Risk Factors include: advanced age (>39men, >57 women);male gender;dyslipidemia     Objective:    Today's Vitals   There is no height or weight on file to calculate BMI.  Advanced Directives 05/08/2020 05/04/2018 07/21/2017 10/15/2015 09/06/2012 09/06/2012  Does Patient Have a Medical Advance Directive? No No No No Patient does not have advance directive -  Does patient want to make changes to medical advance directive? No - Patient declined - - - - -  Would patient like information on creating a medical advance directive? - No - Patient declined No - Patient declined No - patient declined information - -  Pre-existing out of facility DNR order (yellow form or pink MOST form) - - - - No No    Current Medications (verified) Outpatient Encounter Medications as of 05/08/2020  Medication Sig  . b complex vitamins tablet Take 1 tablet by mouth daily.  . cyclobenzaprine (FLEXERIL) 10 MG tablet Take 1 tablet (10 mg total) by mouth at bedtime as needed for muscle spasms.  . Omega-3 Fatty Acids (FISH OIL) 1000 MG CAPS Take by mouth in the morning and at bedtime.  Earney Navy Bicarbonate (ZEGERID OTC) 20-1100 MG CAPS capsule Take 1  capsule by mouth daily before breakfast.  . vitamin C (ASCORBIC ACID) 500 MG tablet Take 500 mg by mouth daily.  . predniSONE (DELTASONE) 20 MG tablet Take two tablets daily for 4 days followed by one tablet daily for 4 days (Patient not taking: Reported on 05/08/2020)   No facility-administered encounter medications on file as of 05/08/2020.    Allergies (verified) Patient has no known allergies.   History: Past Medical History:  Diagnosis Date  . Bilateral inguinal hernia (BIH) s/p lap repair 09/06/2012 08/09/2012  . Cataract   . CMC arthritis, thumb, degenerative    right  . COPD (chronic obstructive pulmonary disease) ([place])   . Emphysema of lung ([place])   . Ex-smoker   . GERD (gastroesophageal reflux disease)   . History of chicken pox   . History of pneumonia 08/06/2009       . Nasal septal deviation 04/14/2017   Marked  . Numbness    R cheek stays numb  . Obesity (BMI 30-39.9) 08/09/2012   Past Surgical History:  Procedure Laterality Date  . ANKLE SURGERY Left 1966   with tendon repair with stainless steel wire  . COLONOSCOPY  07/2017   multiple tubular adenomas, rpt 3 yrs ([place])  . INSERTION OF MESH Bilateral 09/06/2012   Procedure: INSERTION OF MESH;  Surgeon: Adin Hector, MD  . LAPAROSCOPIC INGUINAL HERNIA WITH UMBILICAL HERNIA Bilateral 09/06/2012   Procedure: LAPAROSCOPIC exploration and repair of hernias in bellybutton and bilateral groins with mesh;  Surgeon: Adin Hector, MD  Family History  Problem Relation Age of Onset  . Cancer Mother        breast  . Cancer Sister        lung (minimal smoker)  . Colon cancer Neg Hx   . Esophageal cancer Neg Hx   . Liver cancer Neg Hx   . Rectal cancer Neg Hx   . Stomach cancer Neg Hx   . Pancreatic cancer Neg Hx    Social History   Socioeconomic History  . Marital status: Married    Spouse name: Not on file  . Number of children: Not on file  . Years of education: Not on file  . Highest education level: Not  on file  Occupational History  . Not on file  Tobacco Use  . Smoking status: Former Smoker    Packs/day: 2.00    Years: 40.00    Pack years: 80.00    Types: Cigarettes    Start date: 03/14/1968    Quit date: 03/14/2006    Years since quitting: 14.1  . Smokeless tobacco: Never Used  Vaping Use  . Vaping Use: Never used  Substance and Sexual Activity  . Alcohol use: Yes    Comment: Occasionally  . Drug use: No  . Sexual activity: Yes  Other Topics Concern  . Not on file  Social History Narrative   Caffeine: 2 coffee, 2 tea/day   Lives with wife and 4 cats and 1 dog, grown children   Occupation: Solicitor   Edu: HS   Activity: stays active at work and in garden   Diet: some water, fruits/vegetables daily   Social Determinants of Radio broadcast assistant Strain: [place]   . Difficulty of Paying Living Expenses: Not hard at all  Food Insecurity: No Food Insecurity  . Worried About Charity fundraiser in the Last Year: Never true  . Ran Out of Food in the Last Year: Never true  Transportation Needs: No Transportation Needs  . Lack of Transportation (Medical): No  . Lack of Transportation (Non-Medical): No  Physical Activity: Inactive  . Days of Exercise per Week: 0 days  . Minutes of Exercise per Session: 0 min  Stress: No Stress Concern Present  . Feeling of Stress : Not at all  Social Connections: Not on file    Tobacco Counseling Counseling given: Not Answered   Clinical Intake:  Pre-visit preparation completed: Yes  Pain : No/denies pain     Nutritional Risks: None Diabetes: No  How often do you need to have someone help you when you read instructions, pamphlets, or other written materials from your doctor or pharmacy?: 1 - Never  Diabetic: No Nutrition Risk Assessment:  Has the patient had any N/V/D within the last 2 months?  No  Does the patient have any non-healing wounds?  No  Has the patient had any unintentional weight loss or weight  gain?  No   Diabetes:  Is the patient diabetic?  No  If diabetic, was a CBG obtained today?  N/A Did the patient bring in their glucometer from home?  N/A How often do you monitor your CBG's? N/A.   Financial Strains and Diabetes Management:  Are you having any financial strains with the device, your supplies or your medication? N/A.  Does the patient want to be seen by Chronic Care Management for management of their diabetes?  N/A Would the patient like to be referred to a Nutritionist or for Diabetic Management?  N/A  Interpreter Needed?: No  Information entered by :: CJohnson, LPN   Activities of Daily Living In your present state of health, do you have any difficulty performing the following activities: 05/08/2020  Hearing? N  Vision? N  Difficulty concentrating or making decisions? N  Walking or climbing stairs? N  Dressing or bathing? N  Doing errands, shopping? N  Preparing Food and eating ? N  Using the Toilet? N  In the past six months, have you accidently leaked urine? N  Do you have problems with loss of bowel control? N  Managing your Medications? N  Managing your Finances? N  Housekeeping or managing your Housekeeping? N  Some recent data might be hidden    Patient Care Team: Ria Bush, MD as PCP - General (Family Medicine) Rigoberto Noel, MD as Consulting Physician (Pulmonary Disease)  Indicate any recent Medical Services you may have received from other than [facility] providers in the past year (date may be approximate).     Assessment:   This is a routine wellness examination for [facility].  Hearing/Vision screen  Hearing Screening   125Hz  250Hz  500Hz  1000Hz  2000Hz  3000Hz  4000Hz  6000Hz  8000Hz   Right ear:           Left ear:           Vision Screening Comments: Patient gets annual eye exams   Dietary issues and exercise activities discussed: Current Exercise Habits: The patient does not participate in regular exercise at present, Exercise  limited by: None identified  Goals    . Patient Stated     Starting 05/04/2018, I will continue to sleep at least 7 hours daily.     . Patient Stated     05/08/2020, I will maintain and continue medications as prescribed.       Depression Screen PHQ 2/9 Scores 05/08/2020 05/10/2019 05/04/2018 04/21/2017 10/15/2015  PHQ - 2 Score 0 0 0 0 0  PHQ- 9 Score 0 - 0 - -    Fall Risk Fall Risk  05/08/2020 05/10/2019 05/04/2018 04/21/2017 10/15/2015  Falls in the past year? 0 0 0 No No  Number falls in past yr: 0 - - - -  Injury with Fall? 0 - - - -  Risk for fall due to : No Fall Risks - - - -  Follow up Falls evaluation completed;Falls prevention discussed - - - -    FALL RISK PREVENTION PERTAINING TO THE HOME:  Any stairs in or around the home? Yes  If so, are there any without handrails? No  Home free of loose throw rugs in walkways, pet beds, electrical cords, etc? Yes  Adequate lighting in your home to reduce risk of falls? Yes   ASSISTIVE DEVICES UTILIZED TO PREVENT FALLS:  Life alert? No  Use of a cane, walker or w/c? No  Grab bars in the bathroom? No  Shower chair or bench in shower? No  Elevated toilet seat or a handicapped toilet? No   TIMED UP AND GO:  Was the test performed? N/A telephone visit.   Cognitive Function: MMSE - Mini Mental State Exam 05/08/2020 05/04/2018 10/15/2015  Not completed: Refused - -  Orientation to time - 5 5  Orientation to Place - 5 5  Registration - 3 3  Attention/ Calculation - 0 0  Recall - 1 3  Recall-comments - unable to recall 2 of 3 words -  Language- name 2 objects - 0 0  Language- repeat - 1 1  Language- follow 3  step command - 3 3  Language- read & follow direction - 0 0  Write a sentence - 0 0  Copy design - 0 0  Total score - 18 20  Mini Cog  Mini-Cog screen was not completed. Patient does not feel the need to complete this. Maximum score is 22. A value of 0 denotes this part of the MMSE was not completed or the patient failed this  part of the Mini-Cog screening.       Immunizations Immunization History  Administered Date(s) Administered  . Influenza Split 11/13/2010  . Influenza Whole 12/13/2011  . Influenza, High Dose Seasonal PF 12/22/2016, 01/25/2018, 11/23/2018, 11/29/2019  . Influenza,inj,Quad PF,6+ Mos 12/22/2015  . PFIZER(Purple Top)SARS-COV-2 Vaccination 04/05/2019, 04/26/2019, 12/16/2019  . Pneumococcal Conjugate-13 10/15/2015  . Pneumococcal Polysaccharide-23 05/03/2011, 04/21/2017  . Zoster 01/13/2012    TDAP status: Up to date  Flu Vaccine status: Up to date  Pneumococcal vaccine status: Up to date  Covid-19 vaccine status: Completed vaccines  Qualifies for Shingles Vaccine? Yes   Zostavax completed Yes   Shingrix Completed?: No.    Education has been provided regarding the importance of this vaccine. Patient has been advised to call insurance company to determine out of pocket expense if they have not yet received this vaccine. Advised may also receive vaccine at local pharmacy or Health Dept. Verbalized acceptance and understanding.  Screening Tests Health Maintenance  Topic Date Due  . COLONOSCOPY (Pts 45-15yrs Insurance coverage will need to be confirmed)  07/21/2020  . TETANUS/TDAP  03/14/2022  . INFLUENZA VACCINE  Completed  . COVID-19 Vaccine  Completed  . Hepatitis C Screening  Completed  . PNA vac Low Risk Adult  Completed    Health Maintenance  There are no preventive care reminders to display for this patient.  Colorectal cancer screening: Type of screening: Colonoscopy. Completed 07/21/2017. Repeat every 3 years  Lung Cancer Screening: (Low Dose CT Chest recommended if Age 42-80 years, 30 pack-year currently smoking OR have quit w/in 15years.) does not qualify.    Additional Screening:  Hepatitis C Screening: does qualify; Completed 10/15/2015   Vision Screening: Recommended annual ophthalmology exams for early detection of glaucoma and other disorders of the eye. Is  the patient up to date with their annual eye exam?  Yes  Who is the provider or what is the name of the office in which the patient attends annual eye exams? Dr. Carmell Austria If pt is not established with a provider, would they like to be referred to a provider to establish care? No .   Dental Screening: Recommended annual dental exams for proper oral hygiene  Community Resource Referral / Chronic Care Management: CRR required this visit?  No   CCM required this visit?  No      Plan:     I have personally reviewed and noted the following in the patient's chart:   . Medical and social history . Use of alcohol, tobacco or illicit drugs  . Current medications and supplements . Functional ability and status . Nutritional status . Physical activity . Advanced directives . List of other physicians . Hospitalizations, surgeries, and ER visits in previous 12 months . Vitals . Screenings to include cognitive, depression, and falls . Referrals and appointments  In addition, I have reviewed and discussed with patient certain preventive protocols, quality metrics, and best practice recommendations. A written personalized care plan for preventive services as well as general preventive health recommendations were provided to patient.   Due  to this being a telephonic visit, the after visit summary with patients personalized plan was offered to patient via office or my-chart. Patient preferred to pick up at office at next visit or via mychart.   Andrez Grime, LPN   [id number]

## 2020-05-08 NOTE — Progress Notes (Signed)
PCP notes:  Health Maintenance: No gaps noted   Abnormal Screenings: none   Patient concerns: none   Nurse concerns: none   Next PCP appt.: 05/12/2020 @ 2:30 pm

## 2020-05-08 NOTE — Patient Instructions (Signed)
Ryan Mathews , Thank you for taking time to come for your Medicare Wellness Visit. I appreciate your ongoing commitment to your health goals. Please review the following plan we discussed and let me know if I can assist you in the future.   Screening recommendations/referrals: Colonoscopy: Up to date, completed 07/21/2017, due 07/2020 Recommended yearly ophthalmology/optometry visit for glaucoma screening and checkup Recommended yearly dental visit for hygiene and checkup  Vaccinations: Influenza vaccine: Up to date, completed 11/29/2019, due 10/2020 Pneumococcal vaccine: Completed series Tdap vaccine: Up to date, completed 03/14/2012, due 03/2022 Shingles vaccine: due, check with your insurance regarding coverage if interested    Covid-19: Completed series  Advanced directives: Advance directive discussed with you today. Even though you declined this today please call our office should you change your mind and we can give you the proper paperwork for you to fill out.   Conditions/risks identified: dyslipidemia   Next appointment: Follow up in one year for your annual wellness visit.   Preventive Care 52 Years and Older, Male Preventive care refers to lifestyle choices and visits with your health care provider that can promote health and wellness. What does preventive care include?  A yearly physical exam. This is also called an annual well check.  Dental exams once or twice a year.  Routine eye exams. Ask your health care provider how often you should have your eyes checked.  Personal lifestyle choices, including:  Daily care of your teeth and gums.  Regular physical activity.  Eating a healthy diet.  Avoiding tobacco and drug use.  Limiting alcohol use.  Practicing safe sex.  Taking low doses of aspirin every day.  Taking vitamin and mineral supplements as recommended by your health care provider. What happens during an annual well check? The services and screenings done  by your health care provider during your annual well check will depend on your age, overall health, lifestyle risk factors, and family history of disease. Counseling  Your health care provider may ask you questions about your:  Alcohol use.  Tobacco use.  Drug use.  Emotional well-being.  Home and relationship well-being.  Sexual activity.  Eating habits.  History of falls.  Memory and ability to understand (cognition).  Work and work Statistician. Screening  You may have the following tests or measurements:  Height, weight, and BMI.  Blood pressure.  Lipid and cholesterol levels. These may be checked every 5 years, or more frequently if you are over 56 years old.  Skin check.  Lung cancer screening. You may have this screening every year starting at age 34 if you have a 30-pack-year history of smoking and currently smoke or have quit within the past 15 years.  Fecal occult blood test (FOBT) of the stool. You may have this test every year starting at age 67.  Flexible sigmoidoscopy or colonoscopy. You may have a sigmoidoscopy every 5 years or a colonoscopy every 10 years starting at age 18.  Prostate cancer screening. Recommendations will vary depending on your family history and other risks.  Hepatitis C blood test.  Hepatitis B blood test.  Sexually transmitted disease (STD) testing.  Diabetes screening. This is done by checking your blood sugar (glucose) after you have not eaten for a while (fasting). You may have this done every 1-3 years.  Abdominal aortic aneurysm (AAA) screening. You may need this if you are a current or former smoker.  Osteoporosis. You may be screened starting at age 28 if you are at high risk.  Talk with your health care provider about your test results, treatment options, and if necessary, the need for more tests. Vaccines  Your health care provider may recommend certain vaccines, such as:  Influenza vaccine. This is recommended every  year.  Tetanus, diphtheria, and acellular pertussis (Tdap, Td) vaccine. You may need a Td booster every 10 years.  Zoster vaccine. You may need this after age 42.  Pneumococcal 13-valent conjugate (PCV13) vaccine. One dose is recommended after age 87.  Pneumococcal polysaccharide (PPSV23) vaccine. One dose is recommended after age 55. Talk to your health care provider about which screenings and vaccines you need and how often you need them. This information is not intended to replace advice given to you by your health care provider. Make sure you discuss any questions you have with your health care provider. Document Released: 03/27/2015 Document Revised: 11/18/2015 Document Reviewed: 12/30/2014 Elsevier Interactive Patient Education  2017 [place] Prevention in the Home Falls can cause injuries. They can happen to people of all ages. There are many things you can do to make your home safe and to help prevent falls. What can I do on the outside of my home?  Regularly fix the edges of walkways and driveways and fix any cracks.  Remove anything that might make you trip as you walk through a door, such as a raised step or threshold.  Trim any bushes or trees on the path to your home.  Use bright outdoor lighting.  Clear any walking paths of anything that might make someone trip, such as rocks or tools.  Regularly check to see if handrails are loose or broken. Make sure that both sides of any steps have handrails.  Any raised decks and porches should have guardrails on the edges.  Have any leaves, snow, or ice cleared regularly.  Use sand or salt on walking paths during winter.  Clean up any spills in your garage right away. This includes oil or grease spills. What can I do in the bathroom?  Use night lights.  Install grab bars by the toilet and in the tub and shower. Do not use towel bars as grab bars.  Use non-skid mats or decals in the tub or shower.  If you  need to sit down in the shower, use a plastic, non-slip stool.  Keep the floor dry. Clean up any water that spills on the floor as soon as it happens.  Remove soap buildup in the tub or shower regularly.  Attach bath mats securely with double-sided non-slip rug tape.  Do not have throw rugs and other things on the floor that can make you trip. What can I do in the bedroom?  Use night lights.  Make sure that you have a light by your bed that is easy to reach.  Do not use any sheets or blankets that are too big for your bed. They should not hang down onto the floor.  Have a firm chair that has side arms. You can use this for support while you get dressed.  Do not have throw rugs and other things on the floor that can make you trip. What can I do in the kitchen?  Clean up any spills right away.  Avoid walking on wet floors.  Keep items that you use a lot in easy-to-reach places.  If you need to reach something above you, use a strong step stool that has a grab bar.  Keep electrical cords out of the way.  Do not use floor polish or wax that makes floors slippery. If you must use wax, use non-skid floor wax.  Do not have throw rugs and other things on the floor that can make you trip. What can I do with my stairs?  Do not leave any items on the stairs.  Make sure that there are handrails on both sides of the stairs and use them. Fix handrails that are broken or loose. Make sure that handrails are as long as the stairways.  Check any carpeting to make sure that it is firmly attached to the stairs. Fix any carpet that is loose or worn.  Avoid having throw rugs at the top or bottom of the stairs. If you do have throw rugs, attach them to the floor with carpet tape.  Make sure that you have a light switch at the top of the stairs and the bottom of the stairs. If you do not have them, ask someone to add them for you. What else can I do to help prevent falls?  Wear shoes  that:  Do not have high heels.  Have rubber bottoms.  Are comfortable and fit you well.  Are closed at the toe. Do not wear sandals.  If you use a stepladder:  Make sure that it is fully opened. Do not climb a closed stepladder.  Make sure that both sides of the stepladder are locked into place.  Ask someone to hold it for you, if possible.  Clearly mark and make sure that you can see:  Any grab bars or handrails.  First and last steps.  Where the edge of each step is.  Use tools that help you move around (mobility aids) if they are needed. These include:  Canes.  Walkers.  Scooters.  Crutches.  Turn on the lights when you go into a dark area. Replace any light bulbs as soon as they burn out.  Set up your furniture so you have a clear path. Avoid moving your furniture around.  If any of your floors are uneven, fix them.  If there are any pets around you, be aware of where they are.  Review your medicines with your doctor. Some medicines can make you feel dizzy. This can increase your chance of falling. Ask your doctor what other things that you can do to help prevent falls. This information is not intended to replace advice given to you by your health care provider. Make sure you discuss any questions you have with your health care provider. Document Released: 12/25/2008 Document Revised: 08/06/2015 Document Reviewed: 04/04/2014 Elsevier Interactive Patient Education  2017 [employer].

## 2020-05-12 ENCOUNTER — Other Ambulatory Visit: Payer: Self-pay

## 2020-05-12 ENCOUNTER — Ambulatory Visit (INDEPENDENT_AMBULATORY_CARE_PROVIDER_SITE_OTHER): Payer: Medicare Other | Admitting: Family Medicine

## 2020-05-12 ENCOUNTER — Encounter: Payer: Self-pay | Admitting: Family Medicine

## 2020-05-12 VITALS — BP 148/86 | HR 80 | Temp 97.7°F | Ht 68.5 in | Wt 222.1 lb

## 2020-05-12 DIAGNOSIS — J439 Emphysema, unspecified: Secondary | ICD-10-CM | POA: Diagnosis not present

## 2020-05-12 DIAGNOSIS — Z87891 Personal history of nicotine dependence: Secondary | ICD-10-CM

## 2020-05-12 DIAGNOSIS — E785 Hyperlipidemia, unspecified: Secondary | ICD-10-CM | POA: Diagnosis not present

## 2020-05-12 DIAGNOSIS — I700 Atherosclerosis of aorta: Secondary | ICD-10-CM | POA: Diagnosis not present

## 2020-05-12 DIAGNOSIS — K219 Gastro-esophageal reflux disease without esophagitis: Secondary | ICD-10-CM

## 2020-05-12 DIAGNOSIS — K760 Fatty (change of) liver, not elsewhere classified: Secondary | ICD-10-CM | POA: Diagnosis not present

## 2020-05-12 DIAGNOSIS — E669 Obesity, unspecified: Secondary | ICD-10-CM | POA: Diagnosis not present

## 2020-05-12 DIAGNOSIS — R030 Elevated blood-pressure reading, without diagnosis of hypertension: Secondary | ICD-10-CM

## 2020-05-12 DIAGNOSIS — Z7189 Other specified counseling: Secondary | ICD-10-CM | POA: Diagnosis not present

## 2020-05-12 MED ORDER — FISH OIL 1000 MG PO CAPS: 1.0000 | ORAL_CAPSULE | Freq: Every day | ORAL | Status: DC

## 2020-05-12 NOTE — Assessment & Plan Note (Signed)
Chronic, stable on otc zegerid and black licorice.

## 2020-05-12 NOTE — Assessment & Plan Note (Signed)
Advanced directive discussion: working with Manufacturing systems engineer to set this up. Would like wife to be HCPOA. Doesn't want prolonged life support. Asked to bring me a copy.

## 2020-05-12 NOTE — Assessment & Plan Note (Signed)
Chronic, stable off meds. Continue to monitor. Discussed fish oil dosing.  The 10-year ASCVD risk score Mikey Bussing DC Brooke Bonito., et al., 2013) is: 26.9%   Values used to calculate the score:     Age: 75 years     Sex: Male     Is Non-Hispanic African American: No     Diabetic: No     Tobacco smoker: No     Systolic Blood Pressure: 914 mmHg     Is BP treated: No     HDL Cholesterol: 35.1 mg/dL     Total Cholesterol: 137 mg/dL

## 2020-05-12 NOTE — Assessment & Plan Note (Signed)
BP elevation again noted. Discussed black licorice effect on blood pressure. rec low sodium/salt diet, increased water, start monitoring BP at home and let me know if consistently elevated to consider medication. Agrees to return 3 mo HTN recheck.

## 2020-05-12 NOTE — Assessment & Plan Note (Signed)
Consider statin.  ?

## 2020-05-12 NOTE — Assessment & Plan Note (Signed)
Asxs. Will monitor.

## 2020-05-12 NOTE — Assessment & Plan Note (Addendum)
Ex smoker  Continues lung cancer screening CT program.

## 2020-05-12 NOTE — Assessment & Plan Note (Signed)
Encouraged ongoing healthy diet and lifestyle choices to affect sustainable weight loss.  

## 2020-05-12 NOTE — Progress Notes (Signed)
Patient ID: Ryan Mathews, male    DOB: [birth year], 75 y.o.   MRN: [id number]  This visit was conducted in person.  BP (!) 148/86 (BP Location: Right Arm, Cuff Size: Large)   Pulse 80   Temp 97.7 F (36.5 C) (Temporal)   Ht 5' 8.5" (1.74 m)   Wt 222 lb 2 oz (100.8 kg)   SpO2 95%   BMI 33.28 kg/m   BP Readings from Last 3 Encounters:  05/12/20 (!) 148/86  04/24/20 140/82  12/09/19 (!) 160/100   CC: AMW Subjective:   HPI: Ryan Mathews is a 75 y.o. male presenting on 05/12/2020 for Annual Exam (Prt 2. )   Saw health advisor last week for medicare wellness visit. Note reviewed.    No exam data present  Flowsheet Row Clinical Support from 05/08/2020 in [place] at [facility]  PHQ-2 Total Score 0      Fall Risk  05/08/2020 05/10/2019 05/04/2018 04/21/2017 10/15/2015  Falls in the past year? 0 0 0 No No  Number falls in past yr: 0 - - - -  Injury with Fall? 0 - - - -  Risk for fall due to : No Fall Risks - - - -  Follow up Falls evaluation completed;Falls prevention discussed - - - -  Acute L sciatica suffered last month, treated with prednisone, tramadol, flexeril.   Doesn't check BP at home.  He enjoys black licorice - helps reflux symptoms but worried it may increase blood pressure.  He started taking fish oil 1000mg  bid about 2 wks ago.   Preventative: COLONOSCOPY 05/2019multiple tubular adenomas, rpt 3 yrs ([place])  Prostate cancer screen - PSA reassuring.DRE reassuring 2019, 2021.  Lung cancer screening - ex smoker quit 2008. Has had 3 lung cancer screening CTs (first 2 were 6 months apart)  Flu shotyearly COVID vaccine Pfizer 03/2019, 04/2019, 12/2019 Tetanusunsure Pneumovax 2013, prevnar 10/2015, pneumovax2/2019 zostavax2013 shingrix - discussed.declines. Wife had bad reaction to it.  Advanced directive discussion: working with Manufacturing systems engineer to set this up. Would like wife to be HCPOA. Doesn't want prolonged life support. Asked to bring me a  copy.  Seat belt use discussed.  Sunscreen use discussed. No changing moles on skin.Sees derm.  Ex smoker - quit 2008. >30 PY hx Alcohol - seldom  Dentist - hasn't seen recently - wants to schedule Eye exam yearly([facility]) - discussing cataract surgery L>R  Bowel - no constipation Bladder - no incontinence  Caffeine: 2 coffee, 2 tea/day Lives with wife and 4 cats and 1 dog, grown children Occupation: [employer]  Edu: HS Activity: stays active at work and in garden  Diet:poorwaterintake, fruits/vegetables daily     Relevant past medical, surgical, family and social history reviewed and updated as indicated. Interim medical history since our last visit reviewed. Allergies and medications reviewed and updated. Outpatient Medications Prior to Visit  Medication Sig Dispense Refill  . b complex vitamins tablet Take 1 tablet by mouth daily.    [place] Bicarbonate (ZEGERID OTC) 20-1100 MG CAPS capsule Take 1 capsule by mouth daily before breakfast. 28 each 0  . vitamin C (ASCORBIC ACID) 500 MG tablet Take 500 mg by mouth daily.    . Omega-3 Fatty Acids (FISH OIL) 1000 MG CAPS Take by mouth in the morning and at bedtime.    . cyclobenzaprine (FLEXERIL) 10 MG tablet Take 1 tablet (10 mg total) by mouth at bedtime as needed for muscle spasms. [place]  tablet 0  . predniSONE (DELTASONE) 20 MG tablet Take two tablets daily for 4 days followed by one tablet daily for 4 days (Patient not taking: Reported on 05/08/2020) 12 tablet 0   No facility-administered medications prior to visit.     Per HPI unless specifically indicated in ROS section below Review of Systems Objective:  BP (!) 148/86 (BP Location: Right Arm, Cuff Size: Large)   Pulse 80   Temp 97.7 F (36.5 C) (Temporal)   Ht 5' 8.5" (1.74 m)   Wt 222 lb 2 oz (100.8 kg)   SpO2 95%   BMI 33.28 kg/m   Wt Readings from Last 3 Encounters:  05/12/20 222 lb 2 oz (100.8 kg)  04/24/20 225 lb 8 oz (102.3 kg)   12/09/19 225 lb 4 oz (102.2 kg)      Physical Exam Vitals and nursing note reviewed.  Constitutional:      General: He is not in acute distress.    Appearance: Normal appearance. He is well-developed and well-nourished. He is obese. He is not ill-appearing.  HENT:     Head: Normocephalic and atraumatic.     Right Ear: Hearing, tympanic membrane, ear canal and external ear normal.     Left Ear: Hearing, tympanic membrane, ear canal and external ear normal. There is impacted cerumen.     Mouth/Throat:     Mouth: Oropharynx is clear and moist and mucous membranes are normal.     Pharynx: No posterior oropharyngeal edema.  Eyes:     General: No scleral icterus.    Extraocular Movements: Extraocular movements intact and EOM normal.     Conjunctiva/sclera: Conjunctivae normal.     Pupils: Pupils are equal, round, and reactive to light.  Neck:     Thyroid: No thyroid mass or thyromegaly.     Vascular: No carotid bruit.  Cardiovascular:     Rate and Rhythm: Normal rate and regular rhythm.     Pulses: Normal pulses and intact distal pulses.          Radial pulses are 2+ on the right side and 2+ on the left side.     Heart sounds: Normal heart sounds. No murmur heard.   Pulmonary:     Effort: Pulmonary effort is normal. No respiratory distress.     Breath sounds: Normal breath sounds. No wheezing, rhonchi or rales.  Abdominal:     General: Bowel sounds are normal. There is no distension.     Palpations: Abdomen is soft. There is no mass.     Tenderness: There is no abdominal tenderness. There is no guarding or rebound.     Hernia: No hernia is present.     Comments: Diastasis recti  Musculoskeletal:        General: No edema. Normal range of motion.     Cervical back: Normal range of motion and neck supple.     Right lower leg: No edema.     Left lower leg: No edema.  Lymphadenopathy:     Cervical: No cervical adenopathy.  Skin:    General: Skin is warm and dry.     Findings:  No rash.  Neurological:     General: No focal deficit present.     Mental Status: He is alert and oriented to person, place, and time.     Comments: CN grossly intact, station and gait intact  Psychiatric:        Mood and Affect: Mood and affect and mood normal.  Behavior: Behavior normal.        Thought Content: Thought content normal.        Judgment: Judgment normal.       Results for orders placed or performed in visit on 05/06/20  PSA, Medicare  Result Value Ref Range   PSA 0.84 0.10 - 4.00 ng/ml  CBC with Differential/Platelet  Result Value Ref Range   WBC 11.5 (H) 4.0 - 10.5 K/uL   RBC 5.28 4.22 - 5.81 Mil/uL   Hemoglobin 17.1 (H) 13.0 - 17.0 g/dL   HCT 49.9 39.0 - 52.0 %   MCV 94.5 78.0 - 100.0 fl   MCHC 34.4 30.0 - 36.0 g/dL   RDW 13.2 11.5 - 15.5 %   Platelets 183.0 150.0 - 400.0 K/uL   Neutrophils Relative % 74.0 43.0 - 77.0 %   Lymphocytes Relative 14.1 12.0 - 46.0 %   Monocytes Relative 8.4 3.0 - 12.0 %   Eosinophils Relative 2.9 0.0 - 5.0 %   Basophils Relative 0.6 0.0 - 3.0 %   Neutro Abs 8.5 (H) 1.4 - 7.7 K/uL   Lymphs Abs 1.6 0.7 - 4.0 K/uL   Monocytes Absolute 1.0 0.1 - 1.0 K/uL   Eosinophils Absolute 0.3 0.0 - 0.7 K/uL   Basophils Absolute 0.1 0.0 - 0.1 K/uL  Comprehensive metabolic panel  Result Value Ref Range   Sodium 138 135 - 145 mEq/L   Potassium 4.4 3.5 - 5.1 mEq/L   Chloride 102 96 - 112 mEq/L   CO2 29 19 - 32 mEq/L   Glucose, Bld 141 (H) 70 - 99 mg/dL   BUN 22 6 - 23 mg/dL   Creatinine, Ser 1.15 0.40 - 1.50 mg/dL   Total Bilirubin 0.6 0.2 - 1.2 mg/dL   Alkaline Phosphatase 67 39 - 117 U/L   AST 25 0 - 37 U/L   ALT 30 0 - 53 U/L   Total Protein 6.8 6.0 - 8.3 g/dL   Albumin 3.9 3.5 - 5.2 g/dL   GFR 63.31 >60.00 mL/min   Calcium 9.2 8.4 - 10.5 mg/dL  Lipid panel  Result Value Ref Range   Cholesterol 137 0 - 200 mg/dL   Triglycerides 124.0 0.0 - 149.0 mg/dL   HDL 35.10 (L) >39.00 mg/dL   VLDL 24.8 0.0 - 40.0 mg/dL   LDL  Cholesterol 77 0 - 99 mg/dL   Total CHOL/HDL Ratio 4    NonHDL 102.19    Assessment & Plan:  This visit occurred during the SARS-CoV-2 public health emergency.  Safety protocols were in place, including screening questions prior to the visit, additional usage of staff PPE, and extensive cleaning of exam room while observing appropriate contact time as indicated for disinfecting solutions.   Problem List Items Addressed This Visit    Thoracic aorta atherosclerosis ([place])    Consider statin.       Obesity, Class I, BMI 30-34.9    Encouraged ongoing healthy diet and lifestyle choices to affect sustainable weight loss.       Hepatic steatosis   GERD (gastroesophageal reflux disease)    Chronic, stable on otc zegerid and black licorice.       Ex-smoker    Ex smoker  Continues lung cancer screening CT program.       Elevated blood pressure reading    BP elevation again noted. Discussed black licorice effect on blood pressure. rec low sodium/salt diet, increased water, start monitoring BP at home and let me know if consistently elevated to  consider medication. Agrees to return 3 mo HTN recheck.       Dyslipidemia    Chronic, stable off meds. Continue to monitor. Discussed fish oil dosing.  The 10-year ASCVD risk score Mikey Bussing DC Brooke Bonito., et al., 2013) is: 26.9%   Values used to calculate the score:     Age: 73 years     Sex: Male     Is Non-Hispanic African American: No     Diabetic: No     Tobacco smoker: No     Systolic Blood Pressure: 161 mmHg     Is BP treated: No     HDL Cholesterol: 35.1 mg/dL     Total Cholesterol: 137 mg/dL       Bullous emphysema ([facility])    Asxs. Will monitor.       Advanced care planning/counseling discussion - Primary    Advanced directive discussion: working with estate planner to set this up. Would like wife to be HCPOA. Doesn't want prolonged life support. Asked to bring me a copy.           Meds ordered this encounter  Medications  . Omega-3  Fatty Acids (FISH OIL) 1000 MG CAPS    Sig: Take 1 capsule (1,000 mg total) by mouth daily.   No orders of the defined types were placed in this encounter.   Patient instructions: You will be due for colonoscopy later this year.  Bring [place] copy of your living will.  Your blood pressure was too high - increase water intake, limit salt/sodium in the diet. Watch black licorice intake as this can increase blood pressures. Start monitoring blood pressures at home, let me know if consistently >140/90.  Return as needed or in 3 months for blood pressure recheck.   Follow up plan: Return in about 3 months (around 08/12/2020), or if symptoms worsen or fail to improve, for follow up visit.  Ria Bush, MD

## 2020-05-12 NOTE — Patient Instructions (Addendum)
You will be due for colonoscopy later this year.  Bring [place] copy of your living will.  Your blood pressure was too high - increase water intake, limit salt/sodium in the diet. Watch black licorice intake as this can increase blood pressures. Start monitoring blood pressures at home, let me know if consistently >140/90.  Return as needed or in 3 months for blood pressure recheck.   [email or web address]">  DASH Eating Plan DASH stands for Dietary Approaches to Stop Hypertension. The DASH eating plan is a healthy eating plan that has been shown to:  Reduce high blood pressure (hypertension).  Reduce your risk for type 2 diabetes, heart disease, and stroke.  Help with weight loss. What are tips for following this plan? Reading food labels  Check food labels for the amount of salt (sodium) per serving. Choose foods with less than 5 percent of the Daily Value of sodium. Generally, foods with less than 300 milligrams (mg) of sodium per serving fit into this eating plan.  To find whole grains, look for the word "whole" as the first word in the ingredient list. Shopping  Buy products labeled as "low-sodium" or "no salt added."  Buy fresh foods. Avoid canned foods and pre-made or frozen meals. Cooking  Avoid adding salt when cooking. Use salt-free seasonings or herbs instead of table salt or sea salt. Check with your health care provider or pharmacist before using salt substitutes.  Do not fry foods. Cook foods using healthy methods such as baking, boiling, grilling, roasting, and broiling instead.  Cook with heart-healthy oils, such as olive, canola, avocado, soybean, or sunflower oil. Meal planning  Eat a balanced diet that includes: ? 4 or more servings of fruits and 4 or more servings of vegetables each day. Try to fill one-half of your plate with fruits and vegetables. ? 6-8 servings of whole grains each day. ? Less than 6 oz (170 g) of lean  meat, poultry, or fish each day. A 3-oz (85-g) serving of meat is about the same size as a deck of cards. One egg equals 1 oz (28 g). ? 2-3 servings of low-fat dairy each day. One serving is 1 cup (237 mL). ? 1 serving of nuts, seeds, or beans 5 times each week. ? 2-3 servings of heart-healthy fats. Healthy fats called omega-3 fatty acids are found in foods such as walnuts, flaxseeds, fortified milks, and eggs. These fats are also found in cold-water fish, such as sardines, salmon, and mackerel.  Limit how much you eat of: ? Canned or prepackaged foods. ? Food that is high in trans fat, such as some fried foods. ? Food that is high in saturated fat, such as fatty meat. ? Desserts and other sweets, sugary drinks, and other foods with added sugar. ? Full-fat dairy products.  Do not salt foods before eating.  Do not eat more than 4 egg yolks a week.  Try to eat at least 2 vegetarian meals a week.  Eat more home-cooked food and less restaurant, buffet, and fast food.   Lifestyle  When eating at a restaurant, ask that your food be prepared with less salt or no salt, if possible.  If you drink alcohol: ? Limit how much you use to:  0-1 drink a day for women who are not pregnant.  0-2 drinks a day for men. ? Be aware of how much alcohol is in your drink. In the [place] one drink equals one 12 oz bottle of beer (355 mL), one 5 oz  glass of wine (148 mL), or one 1 oz glass of hard liquor (44 mL). General information  Avoid eating more than 2,300 mg of salt a day. If you have hypertension, you may need to reduce your sodium intake to 1,500 mg a day.  Work with your health care provider to maintain a healthy body weight or to lose weight. Ask what an ideal weight is for you.  Get at least 30 minutes of exercise that causes your heart to beat faster (aerobic exercise) most days of the week. Activities may include walking, swimming, or biking.  Work with your health care provider or dietitian  to adjust your eating plan to your individual calorie needs. What foods should I eat? Fruits All fresh, dried, or frozen fruit. Canned fruit in natural juice (without added sugar). Vegetables Fresh or frozen vegetables (raw, steamed, roasted, or grilled). Low-sodium or reduced-sodium tomato and vegetable juice. Low-sodium or reduced-sodium tomato sauce and tomato paste. Low-sodium or reduced-sodium canned vegetables. Grains Whole-grain or whole-wheat bread. Whole-grain or whole-wheat pasta. Brown rice. [place]. Bulgur. Whole-grain and low-sodium cereals. Pita bread. Low-fat, low-sodium crackers. Whole-wheat flour tortillas. Meats and other proteins Skinless chicken or [place]. Ground chicken or [place]. Pork with fat trimmed off. Fish and seafood. Egg whites. Dried beans, peas, or lentils. Unsalted nuts, nut butters, and seeds. Unsalted canned beans. Lean cuts of beef with fat trimmed off. Low-sodium, lean precooked or cured meat, such as sausages or meat loaves. Dairy Low-fat (1%) or fat-free (skim) milk. Reduced-fat, low-fat, or fat-free cheeses. Nonfat, low-sodium ricotta or cottage cheese. Low-fat or nonfat yogurt. Low-fat, low-sodium cheese. Fats and oils Soft margarine without trans fats. Vegetable oil. Reduced-fat, low-fat, or light mayonnaise and salad dressings (reduced-sodium). Canola, safflower, olive, avocado, soybean, and sunflower oils. Avocado. Seasonings and condiments Herbs. Spices. Seasoning mixes without salt. Other foods Unsalted popcorn and pretzels. Fat-free sweets. The items listed above may not be a complete list of foods and beverages you can eat. Contact a dietitian for more information. What foods should I avoid? Fruits Canned fruit in a light or heavy syrup. Fried fruit. Fruit in cream or butter sauce. Vegetables Creamed or fried vegetables. Vegetables in a cheese sauce. Regular canned vegetables (not low-sodium or reduced-sodium). Regular canned tomato sauce  and paste (not low-sodium or reduced-sodium). Regular tomato and vegetable juice (not low-sodium or reduced-sodium). Angie Fava. Olives. Grains Baked goods made with fat, such as croissants, muffins, or some breads. Dry pasta or rice meal packs. Meats and other proteins Fatty cuts of meat. Ribs. Fried meat. Berniece Salines. Bologna, salami, and other precooked or cured meats, such as sausages or meat loaves. Fat from the back of a pig (fatback). Bratwurst. Salted nuts and seeds. Canned beans with added salt. Canned or smoked fish. Whole eggs or egg yolks. Chicken or [place] with skin. Dairy Whole or 2% milk, cream, and half-and-half. Whole or full-fat cream cheese. Whole-fat or sweetened yogurt. Full-fat cheese. Nondairy creamers. Whipped toppings. Processed cheese and cheese spreads. Fats and oils Butter. Stick margarine. Lard. Shortening. Ghee. Bacon fat. Tropical oils, such as coconut, palm kernel, or palm oil. Seasonings and condiments Onion salt, garlic salt, seasoned salt, table salt, and sea salt. Worcestershire sauce. Tartar sauce. Barbecue sauce. Teriyaki sauce. Soy sauce, including reduced-sodium. Steak sauce. Canned and packaged gravies. Fish sauce. Oyster sauce. Cocktail sauce. Store-bought horseradish. Ketchup. Mustard. Meat flavorings and tenderizers. Bouillon cubes. Hot sauces. Pre-made or packaged marinades. Pre-made or packaged taco seasonings. Relishes. Regular salad dressings. Other foods Salted popcorn and pretzels.  The items listed above may not be a complete list of foods and beverages you should avoid. Contact a dietitian for more information. Where to find more information  National Heart, Lung, and Blood Institute: https://wilson-eaton.com/  American Heart Association: www.heart.org  Academy of Nutrition and Dietetics: www.eatright.[place]: www.kidney.org Summary  The DASH eating plan is a healthy eating plan that has been shown to reduce high blood pressure  (hypertension). It may also reduce your risk for type 2 diabetes, heart disease, and stroke.  When on the DASH eating plan, aim to eat more fresh fruits and vegetables, whole grains, lean proteins, low-fat dairy, and heart-healthy fats.  With the DASH eating plan, you should limit salt (sodium) intake to 2,300 mg a day. If you have hypertension, you may need to reduce your sodium intake to 1,500 mg a day.  Work with your health care provider or dietitian to adjust your eating plan to your individual calorie needs. This information is not intended to replace advice given to you by your health care provider. Make sure you discuss any questions you have with your health care provider. Document Revised: 02/01/2019 Document Reviewed: 02/01/2019 Elsevier Patient Education  [place] Maintenance After Age 2 After age 54, you are at a higher risk for certain long-term diseases and infections as well as injuries from falls. Falls are a major cause of broken bones and head injuries in people who are older than age 83. Getting regular preventive care can help to keep you healthy and well. Preventive care includes getting regular testing and making lifestyle changes as recommended by your health care provider. Talk with your health care provider about:  Which screenings and tests you should have. A screening is a test that checks for a disease when you have no symptoms.  A diet and exercise plan that is right for you. What should I know about screenings and tests to prevent falls? Screening and testing are the best ways to find a health problem early. Early diagnosis and treatment give you the best chance of managing medical conditions that are common after age 87. Certain conditions and lifestyle choices may make you more likely to have a fall. Your health care provider may recommend:  Regular vision checks. Poor vision and conditions such as cataracts can make you more likely to have a  fall. If you wear glasses, make sure to get your prescription updated if your vision changes.  Medicine review. Work with your health care provider to regularly review all of the medicines you are taking, including over-the-counter medicines. Ask your health care provider about any side effects that may make you more likely to have a fall. Tell your health care provider if any medicines that you take make you feel dizzy or sleepy.  Osteoporosis screening. Osteoporosis is a condition that causes the bones to get weaker. This can make the bones weak and cause them to break more easily.  Blood pressure screening. Blood pressure changes and medicines to control blood pressure can make you feel dizzy.  Strength and balance checks. Your health care provider may recommend certain tests to check your strength and balance while standing, walking, or changing positions.  Foot health exam. Foot pain and numbness, as well as not wearing proper footwear, can make you more likely to have a fall.  Depression screening. You may be more likely to have a fall if you have a fear of falling, feel emotionally low, or feel  unable to do activities that you used to do.  Alcohol use screening. Using too much alcohol can affect your balance and may make you more likely to have a fall. What actions can I take to lower my risk of falls? General instructions  Talk with your health care provider about your risks for falling. Tell your health care provider if: ? You fall. Be sure to tell your health care provider about all falls, even ones that seem minor. ? You feel dizzy, sleepy, or off-balance.  Take over-the-counter and prescription medicines only as told by your health care provider. These include any supplements.  Eat a healthy diet and maintain a healthy weight. A healthy diet includes low-fat dairy products, low-fat (lean) meats, and fiber from whole grains, beans, and lots of fruits and vegetables. Home  safety  Remove any tripping hazards, such as rugs, cords, and clutter.  Install safety equipment such as grab bars in bathrooms and safety rails on stairs.  Keep rooms and walkways well-lit. Activity  Follow a regular exercise program to stay fit. This will help you maintain your balance. Ask your health care provider what types of exercise are appropriate for you.  If you need a cane or walker, use it as recommended by your health care provider.  Wear supportive shoes that have nonskid soles.   Lifestyle  Do not drink alcohol if your health care provider tells you not to drink.  If you drink alcohol, limit how much you have: ? 0-1 drink a day for women. ? 0-2 drinks a day for men.  Be aware of how much alcohol is in your drink. In the [place].S., one drink equals one typical bottle of beer (12 oz), one-half glass of wine (5 oz), or one shot of hard liquor (1 oz).  Do not use any products that contain nicotine or tobacco, such as cigarettes and e-cigarettes. If you need help quitting, ask your health care provider. Summary  Having a healthy lifestyle and getting preventive care can help to protect your health and wellness after age 8.  Screening and testing are the best way to find a health problem early and help you avoid having a fall. Early diagnosis and treatment give you the best chance for managing medical conditions that are more common for people who are older than age 3.  Falls are a major cause of broken bones and head injuries in people who are older than age 38. Take precautions to prevent a fall at home.  Work with your health care provider to learn what changes you can make to improve your health and wellness and to prevent falls. This information is not intended to replace advice given to you by your health care provider. Make sure you discuss any questions you have with your health care provider. Document Revised: 06/21/2018 Document Reviewed: 01/11/2017 Elsevier  Patient Education  2021 [employer].

## 2020-05-28 ENCOUNTER — Telehealth: Payer: Self-pay

## 2020-05-28 DIAGNOSIS — H25811 Combined forms of age-related cataract, right eye: Secondary | ICD-10-CM | POA: Diagnosis not present

## 2020-05-28 DIAGNOSIS — H2511 Age-related nuclear cataract, right eye: Secondary | ICD-10-CM | POA: Diagnosis not present

## 2020-05-28 MED ORDER — AMLODIPINE BESYLATE 5 MG PO TABS: 5.0000 mg | ORAL_TABLET | Freq: Every day | ORAL | 6 refills | Status: DC

## 2020-05-28 NOTE — Telephone Encounter (Signed)
Called pt and wife back to see what his BP was. Most recent was 169/81. He does not feel bad. No headache or dizziness.

## 2020-05-28 NOTE — Telephone Encounter (Addendum)
Pt's wife called stating he had cataract surgery this morning. The anesthesiologist advised her to contact his PCP due to his elevated BP during and after the procedure: 170/100, 200/100, 157/107, 179/120, and 179/95.  BP at home has been 150s-160s/90s prior to surgery. Tried checking it at home while I was on the phone but the batteries were dying. I will call him back later to see what it is.  She said at a recent OV she believes he mentioned his BP.  Asking what he should do. Please advise wife at [phone] or you can call pt at [phone].

## 2020-05-28 NOTE — Addendum Note (Signed)
Addended by: Ria Bush on: 05/28/2020 05:08 PM   Modules accepted: Orders

## 2020-05-28 NOTE — Telephone Encounter (Signed)
Plan at last OV was to stop black licorice to see if BP would improve off this.  Seems BP has continued to stay elevated.  Would recommend starting amlodipine 5mg  daily for blood pressure control - take in am. Start at 1/2 tablet daily for the first week and if BP staying elevated, increase to 5mg  whole dose daily.  Update [place] with BP readings in 2-3 wks, sooner if any trouble. Watch for ankle swelling on this medication.

## 2020-05-29 NOTE — Telephone Encounter (Signed)
Pt wants to know what goal BP Dr Darnell Level has for pt; pt is picking up amlodipine today and will start with 1/2 tab as instructed. Pt notified as instructed DR G's instructions and pt voiced understanding. Pt took BP this morning and BP was 146/81 P 70 something. Pt had eye surgery on 05/28/20 and pt said BP went up and came back down. Pt did not have BP actual readings from eye dr office. Pt will keep BP log and will cb in  2- 3 wks with readings; pt will cb sooner if needed. Pt does request cb with goal for BP. Sending note to DR G.

## 2020-05-29 NOTE — Telephone Encounter (Signed)
Ideal goal BP <140/90, definitely want <150/100.

## 2020-05-29 NOTE — Telephone Encounter (Signed)
Spoke with pt relaying Dr. G's message. Pt verbalizes understanding.  

## 2020-06-04 DIAGNOSIS — H25812 Combined forms of age-related cataract, left eye: Secondary | ICD-10-CM | POA: Diagnosis not present

## 2020-06-04 DIAGNOSIS — H2512 Age-related nuclear cataract, left eye: Secondary | ICD-10-CM | POA: Diagnosis not present

## 2020-07-17 ENCOUNTER — Encounter: Payer: Self-pay | Admitting: Family Medicine

## 2020-07-17 DIAGNOSIS — H35033 Hypertensive retinopathy, bilateral: Secondary | ICD-10-CM | POA: Insufficient documentation

## 2020-07-20 ENCOUNTER — Ambulatory Visit (INDEPENDENT_AMBULATORY_CARE_PROVIDER_SITE_OTHER): Payer: Medicare Other | Admitting: Family Medicine

## 2020-07-20 ENCOUNTER — Encounter: Payer: Self-pay | Admitting: Family Medicine

## 2020-07-20 ENCOUNTER — Other Ambulatory Visit: Payer: Self-pay

## 2020-07-20 VITALS — BP 162/86 | HR 70 | Temp 98.3°F | Ht 68.5 in | Wt 223.2 lb

## 2020-07-20 DIAGNOSIS — M5416 Radiculopathy, lumbar region: Secondary | ICD-10-CM | POA: Diagnosis not present

## 2020-07-20 DIAGNOSIS — G8929 Other chronic pain: Secondary | ICD-10-CM | POA: Diagnosis not present

## 2020-07-20 MED ORDER — TRAMADOL HCL 50 MG PO TABS: 50.0000 mg | ORAL_TABLET | Freq: Three times a day (TID) | ORAL | 0 refills | Status: DC | PRN

## 2020-07-20 MED ORDER — CYCLOBENZAPRINE HCL 10 MG PO TABS: 5.0000 mg | ORAL_TABLET | Freq: Every evening | ORAL | 0 refills | Status: DC | PRN

## 2020-07-20 MED ORDER — PREDNISONE 20 MG PO TABS: ORAL_TABLET | ORAL | 0 refills | Status: DC

## 2020-07-20 NOTE — Progress Notes (Addendum)
[name] Liberman, MD, [place]  Primary Care and Sports Medicine [facility] [place], [address]  Phone: [phone]  FAX: [place] - 75 y.o. male  MRN [id number]  Date of Birth: [birth year]  Date: 07/20/2020  PCP: Ria Bush, MD  Referral: Ria Bush, MD  Chief Complaint  Patient presents with  . Back Pain    Radiates down right hip/leg    This visit occurred during the SARS-CoV-2 public health emergency.  Safety protocols were in place, including screening questions prior to the visit, additional usage of staff PPE, and extensive cleaning of exam room while observing appropriate contact time as indicated for disinfecting solutions.   Subjective:   Ryan Mathews is a 75 y.o. very pleasant male patient who presents with the following: Back Pain  ongoing for approximately: 10-14 d The patient has had back pain before, but this is the worst ever. The back pain is localized into the lumbar spine area. They also describe R sided radiculopathy.  Will have some lightning-like strike of pain.  Works a controller.  Manage insulation.  At this point, he is not doing any manual labor.   No numbness or tingling. No bowel or bladder incontinence. No focal weakness. Prior interventions: None Over-the-counter ibuprofen Physical therapy: No Chiropractic manipulations: No Acupuncture: No Osteopathic manipulation: No Heat or cold: Minimal effect  Addendum history, 08/04/2020: Please see my addendum in his assessment and plan.  Review of Systems is noted in the HPI, as appropriate  Objective:   Blood pressure (!) 162/86, pulse 70, temperature 98.3 F (36.8 C), temperature source Temporal, height 5' 8.5" (1.74 m), weight 223 lb 4 oz (101.3 kg), SpO2 94 %.  GEN: No acute distress; alert,appropriate. PULM: Breathing comfortably in no respiratory distress PSYCH: Normally  interactive.   Range of motion at  the waist: Flexion, rotation and lateral bending: Forward flexion to 40 degrees.  No extension.  Lateral bending is minimal and provokes pain in all directions.  No echymosis or edema Rises to examination table with no difficulty Gait: minimally antalgic  Inspection/Deformity: No abnormality Paraspinus T: L1-S1 bilaterally  B Ankle Dorsiflexion (L5,4): 5/5 B Great Toe Dorsiflexion (L5,4): 5/5 Rise/Squat (L4): WNL, mild pain  SENSORY B Medial Foot (L4): WNL B Dorsum (L5): WNL B Lateral (S1): WNL Light Touch: WNL Pinprick: WNL  B SLR, seated: neg B SLR, supine: neg B Greater Troch: NT B Log Roll: neg B Sciatic Notch: NT Full range of motion at the hip.  Does not provoke any radicular symptoms.  Radiology: DG Lumbar Spine Complete  Result Date: 04/24/2020 CLINICAL DATA:  Low back and left hip pain. EXAM: LUMBAR SPINE - COMPLETE 4+ VIEW COMPARISON:  None. FINDINGS: There is no acute bony or joint abnormality. Mild convex left curvature with the apex at L4 is seen. There is some loss of disc space height and endplate spurring at N3-Z7. Facet degenerative disease is also seen at L5-S1. Paraspinous structures demonstrate aortic atherosclerosis. IMPRESSION: Degenerative disease at L5-S1. Mild convex left curvature. Aortic Atherosclerosis (ICD10-I70.0). Electronically Signed   By: Inge Rise M.D.   On: 04/24/2020 15:46   DG Hip Unilat W OR W/O Pelvis 2-3 Views Left  Result Date: 04/24/2020 CLINICAL DATA:  Left hip pain and limited range of motion. No known injury. EXAM: DG HIP (WITH OR WITHOUT PELVIS) 2-3V LEFT COMPARISON:  None. FINDINGS: There is no evidence of hip fracture or  dislocation. There is no evidence of arthropathy or other focal bone abnormality. IMPRESSION: Negative exam. Electronically Signed   By: Inge Rise M.D.   On: 04/24/2020 15:44   Assessment and Plan:     ICD-10-CM   1. Lumbar radiculopathy, acute  M54.16 MR Lumbar  Spine Wo Contrast    CANCELED: DG Lumbar Spine Complete  2. Chronic radicular lumbar pain  M54.16 MR Lumbar Spine Wo Contrast   G89.29    Consistent with acute lumbar radiculopathy, with lightninglike onset consistent with disc herniation.  Anatomy reviewed. Conservative algorithms for acute back pain generally begin with the following: Muscle Relaxants, Mild pain medication, and at this point I am also going to pulse him with 14 days of some steroids.  3 to 4-week follow-up if not improved  Addendum, 08/04/2020, 10:40 AM. The patient has acute on chronic back pain with radiculopathy on the right side that began in December 2021 and has exacerbation now for approximately 6 weeks, but this is in total almost 6 months of back pain that is now recalcitrant.  This is ebbed and flowed over time, and now it is the worst it has ever been.  He is called me multiple times since his last office visit with severe, recalcitrant pain and he is worsening.  He has been on multiple rounds of steroids, and his pain medications have escalated from tramadol to Tylenol #3 to Vicodin.  Is also tried general anti-inflammatories, muscle relaxants, and these have been of no benefit.  On extensive chart review as well as recent interaction, the patient has at times had radicular symptoms into the left leg and in the right leg.  On prior chart review, the patient did have a positive pain with straight leg raise and radicular symptoms on the left.  My partner the patient's primary care doctor did note this in February 2022.  At the time of his last office visit, I felt that he was in so much pain that additional rehab would be of limited utility.  Now, his pain is escalated.  Lumbar spine x-rays show some mild degenerative changes only.  Obtain an MRI of the lumbar spine without contrast to evaluate for disc herniation with probable nerve impingement from foraminal disc herniation and spinal cord compression given  intermittent bilateral symptoms.    Meds ordered this encounter  Medications  . predniSONE (DELTASONE) 20 MG tablet    Sig: 2 tabs po for 7 days, then 1 tab po for 7 days    Dispense:  21 tablet    Refill:  0  . traMADol (ULTRAM) 50 MG tablet    Sig: Take 1 tablet (50 mg total) by mouth every 8 (eight) hours as needed for moderate pain.    Dispense:  20 tablet    Refill:  0  . cyclobenzaprine (FLEXERIL) 10 MG tablet    Sig: Take 0.5-1 tablets (5-10 mg total) by mouth at bedtime as needed for muscle spasms.    Dispense:  30 tablet    Refill:  0  . HYDROcodone-acetaminophen (NORCO/VICODIN) 5-325 MG tablet    Sig: Take 1 tablet by mouth every 6 (six) hours as needed for moderate pain or severe pain.    Dispense:  30 tablet    Refill:  0    Escalation of pain medication requirements from Tramadol to Tylenol #3 and now to Norco   There are no discontinued medications. Orders Placed This Encounter  Procedures  . MR Lumbar Spine Wo Contrast  Signed,  Maud Deed. Leena Tiede, MD   Outpatient Encounter Medications as of 07/20/2020  Medication Sig  . amLODipine (NORVASC) 5 MG tablet Take 1 tablet (5 mg total) by mouth daily.  Marland Kitchen b complex vitamins tablet Take 1 tablet by mouth daily.  . cyclobenzaprine (FLEXERIL) 10 MG tablet Take 0.5-1 tablets (5-10 mg total) by mouth at bedtime as needed for muscle spasms.  Marland Kitchen HYDROcodone-acetaminophen (NORCO/VICODIN) 5-325 MG tablet Take 1 tablet by mouth every 6 (six) hours as needed for moderate pain or severe pain.  . Omega-3 Fatty Acids (FISH OIL) 1000 MG CAPS Take 1 capsule (1,000 mg total) by mouth daily.  Earney Navy Bicarbonate (ZEGERID OTC) 20-1100 MG CAPS capsule Take 1 capsule by mouth daily before breakfast.  . predniSONE (DELTASONE) 20 MG tablet 2 tabs po for 7 days, then 1 tab po for 7 days  . traMADol (ULTRAM) 50 MG tablet Take 1 tablet (50 mg total) by mouth every 8 (eight) hours as needed for moderate pain.  . vitamin C (ASCORBIC  ACID) 500 MG tablet Take 500 mg by mouth daily.   No facility-administered encounter medications on file as of 07/20/2020.

## 2020-07-29 ENCOUNTER — Telehealth: Payer: Self-pay | Admitting: Family Medicine

## 2020-07-29 DIAGNOSIS — M5416 Radiculopathy, lumbar region: Secondary | ICD-10-CM

## 2020-07-29 MED ORDER — ACETAMINOPHEN-CODEINE #3 300-30 MG PO TABS: 1.0000 | ORAL_TABLET | ORAL | 0 refills | Status: DC | PRN

## 2020-07-29 NOTE — Telephone Encounter (Signed)
Mr. Ryan Mathews and stated the medication that Dr. Edilia Bo prescribed is not working and wanted to know about what to do for the pain.   Please advise

## 2020-07-29 NOTE — Telephone Encounter (Signed)
I sent him in some Tylenol with Codeine - should be stronger.     Meds ordered this encounter  Medications  . acetaminophen-codeine (TYLENOL #3) 300-30 MG tablet    Sig: Take 1 tablet by mouth every 4 (four) hours as needed for moderate pain.    Dispense:  30 tablet    Refill:  0    Acute disc herniation.  Previously on acute use of Tramadol with minimal effect.

## 2020-07-29 NOTE — Telephone Encounter (Signed)
Called and informed patient of this information. Patient verbalized understanding.

## 2020-08-04 MED ORDER — HYDROCODONE-ACETAMINOPHEN 5-325 MG PO TABS: 1.0000 | ORAL_TABLET | Freq: Four times a day (QID) | ORAL | 0 refills | Status: DC | PRN

## 2020-08-04 NOTE — Addendum Note (Signed)
Addended by: Owens Loffler on: 08/04/2020 10:55 AM   Modules accepted: Orders

## 2020-08-04 NOTE — Telephone Encounter (Signed)
Ryan Mathews notified as instructed by telephone.  Patient states understanding.   Patient is agreeable with Vicodin Rx and MRI.

## 2020-08-04 NOTE — Telephone Encounter (Signed)
Please call:  I am going to send him in some Vicodin.    I think it is reasonable to get an MRI of his back.  I will try to get it approved based on his last office note.    If his insurance denies the MRI, then I will have to reexamine him and reassess with an updated note.

## 2020-08-04 NOTE — Telephone Encounter (Signed)
Pt called triage back because the pain medications are still not helping his pain. His back and leg pain are worsening despite the medications Dr. Lorelei Pont has prescribed. Pt said he isn't sleeping due to the severity of the pain. His main concern is "why" is his back and legs hurting so much, pt said he thinks it's time for a MRI or some type of imaging to see exactly why he is in so much pain.

## 2020-08-12 HISTORY — PX: MICRODISCECTOMY LUMBAR: SUR864

## 2020-08-14 ENCOUNTER — Other Ambulatory Visit: Payer: Self-pay

## 2020-08-14 ENCOUNTER — Encounter: Payer: Self-pay | Admitting: Family Medicine

## 2020-08-14 ENCOUNTER — Ambulatory Visit (INDEPENDENT_AMBULATORY_CARE_PROVIDER_SITE_OTHER): Payer: Medicare Other | Admitting: Family Medicine

## 2020-08-14 VITALS — BP 158/72 | HR 78 | Temp 97.7°F | Ht 68.5 in | Wt 219.2 lb

## 2020-08-14 DIAGNOSIS — M5441 Lumbago with sciatica, right side: Secondary | ICD-10-CM | POA: Insufficient documentation

## 2020-08-14 DIAGNOSIS — I10 Essential (primary) hypertension: Secondary | ICD-10-CM | POA: Diagnosis not present

## 2020-08-14 HISTORY — DX: Lumbago with sciatica, right side: M54.41

## 2020-08-14 MED ORDER — HYDROCODONE-ACETAMINOPHEN 5-325 MG PO TABS: 1.0000 | ORAL_TABLET | Freq: Four times a day (QID) | ORAL | 0 refills | Status: DC | PRN

## 2020-08-14 MED ORDER — GABAPENTIN 300 MG PO CAPS: ORAL_CAPSULE | ORAL | 1 refills | Status: DC

## 2020-08-14 NOTE — Assessment & Plan Note (Signed)
New, severe, ongoing >1 month, with anterior shin numbness and painful activity limiting radiculopathy. Suspect due to HNP. Fortunately has upcoming MRI scheduled for next month. I will refill vicodin in interim and start gabapentin for neuropathic component of pain. Sedation precautions reviewed with gabapentin and hydrocodone.  [place] CSRS reviewed.

## 2020-08-14 NOTE — Patient Instructions (Addendum)
Take full amlodipine dose 5mg  daily given blood pressures are higher (likely due to pain). If pain improves and blood pressure drops, then many return to 1/2 tablet daily.  vicodin refilled today.  Try gabapentin 300mg  nightly for 3 days and if tolerating well, may increase to twice daily, and after 3 days if needing more may increase to three times daily. Update me with how gabapentin works.

## 2020-08-14 NOTE — Progress Notes (Signed)
Patient ID: Ryan Mathews, male    DOB: 05-Mar-1946, 75 y.o.   MRN: [id number]  This visit was conducted in person.  BP (!) 158/72   Pulse 78   Temp 97.7 F (36.5 C) (Temporal)   Ht 5' 8.5" (1.74 m)   Wt 219 lb 4 oz (99.5 kg)   SpO2 97%   BMI 32.85 kg/m   160/74 on recheck  CC: HTN f/u visit  Subjective:   HPI: Ryan Mathews is a 75 y.o. male presenting on 08/14/2020 for Follow-up (Here for 3 mo BP chk. ) and Back Pain (C/o continued low back pain radiating into right leg.  Seen recently for same pain.  MRI scheduled on 08/19/20.  )   L lumbar radiculopathy started 03/2020. Seen 04/2020 s/p treatment with prednisone course, flexeril, tramadol --> tylenol #3 --> vicodin for breakthrough pain. Seen again last month with acute R radiculopathy s/p another prednisone 2 wk course, pain medications - pending MRI next week. Ongoing severe leg aches waking him up at night, burning pain starts in R hip and travels down lateral leg. Vicodin only effective for about 6 hours. Flexeril, prednisone. Worse pain getting in and out of bed. Denies inciting trauma/injury or falls. R ankle swelling noted. Unable to put pressure on right side. Now needs to walk with walker.   He continues working - seated all day. Last vicodin was at 11am this morning - now completely out.   HTN - Compliant with current antihypertensive regimen of amlodipine 5mg  daily, also decreased black licorice use. Does check blood pressures at home: notes wide fluctuations. Already limiting salt intake. No low blood pressure readings or symptoms of dizziness/syncope. Denies HA, vision changes, CP/tightness, SOB, leg swelling.       Relevant past medical, surgical, family and social history reviewed and updated as indicated. Interim medical history since our last visit reviewed. Allergies and medications reviewed and updated. Outpatient Medications Prior to Visit  Medication Sig Dispense Refill  . amLODipine (NORVASC) 5 MG  tablet Take 1 tablet (5 mg total) by mouth daily. (Patient taking differently: Take 5 mg by mouth daily. Takes 1/2 tablet daily) 30 tablet 6  . b complex vitamins tablet Take 1 tablet by mouth daily.    . cyclobenzaprine (FLEXERIL) 10 MG tablet Take 0.5-1 tablets (5-10 mg total) by mouth at bedtime as needed for muscle spasms. 30 tablet 0  . Omega-3 Fatty Acids (FISH OIL) 1000 MG CAPS Take 1 capsule (1,000 mg total) by mouth daily.    Earney Navy Bicarbonate (ZEGERID OTC) 20-1100 MG CAPS capsule Take 1 capsule by mouth daily before breakfast. 28 each 0  . vitamin C (ASCORBIC ACID) 500 MG tablet Take 500 mg by mouth daily.    Marland Kitchen HYDROcodone-acetaminophen (NORCO/VICODIN) 5-325 MG tablet Take 1 tablet by mouth every 6 (six) hours as needed for moderate pain or severe pain. 30 tablet 0  . acetaminophen-codeine (TYLENOL #3) 300-30 MG tablet Take 1 tablet by mouth every 4 (four) hours as needed for moderate pain. 30 tablet 0  . predniSONE (DELTASONE) 20 MG tablet 2 tabs po for 7 days, then 1 tab po for 7 days 21 tablet 0  . traMADol (ULTRAM) 50 MG tablet Take 1 tablet (50 mg total) by mouth every 8 (eight) hours as needed for moderate pain. 20 tablet 0   No facility-administered medications prior to visit.     Per HPI unless specifically indicated in ROS section below Review of Systems Objective:  BP (!) 158/72   Pulse 78   Temp 97.7 F (36.5 C) (Temporal)   Ht 5' 8.5" (1.74 m)   Wt 219 lb 4 oz (99.5 kg)   SpO2 97%   BMI 32.85 kg/m   Wt Readings from Last 3 Encounters:  08/14/20 219 lb 4 oz (99.5 kg)  07/20/20 223 lb 4 oz (101.3 kg)  05/12/20 222 lb 2 oz (100.8 kg)      Physical Exam Vitals and nursing note reviewed.  Constitutional:      Appearance: Normal appearance. He is not ill-appearing.     Comments: Uncomfortable appearing, walks with walker  Musculoskeletal:        General: Tenderness present.     Right lower leg: Edema (1+ at ankle) present.     Left lower leg:  Edema (tr at ankle) present.     Comments:  Pain with palpation midline ~L3  ++ R lumbar paraspinous mm tenderness ++ seated SLR on right  Skin:    General: Skin is warm and dry.  Neurological:     Mental Status: He is alert.     Sensory: Sensory deficit present.     Comments:  Antalgic gait due to pain Diminished sensation to light touch anterior R shin       Assessment & Plan:  This visit occurred during the SARS-CoV-2 public health emergency.  Safety protocols were in place, including screening questions prior to the visit, additional usage of staff PPE, and extensive cleaning of exam room while observing appropriate contact time as indicated for disinfecting solutions.   Problem List Items Addressed This Visit    Hypertension    Persistent bp elevation despite amlodipine 2.5mg  (had been taking 1/2 tab). Will increase to 5mg  daily, monitoring for worsening pedal edema. Anticipate significant ongoing back pain exacerbating hypertension.       Acute midline low back pain with right-sided sciatica - Primary    New, severe, ongoing >1 month, with anterior shin numbness and painful activity limiting radiculopathy. Suspect due to HNP. Fortunately has upcoming MRI scheduled for next month. I will refill vicodin in interim and start gabapentin for neuropathic component of pain. Sedation precautions reviewed with gabapentin and hydrocodone.  [place] CSRS reviewed.       Relevant Medications   HYDROcodone-acetaminophen (NORCO/VICODIN) 5-325 MG tablet   gabapentin (NEURONTIN) 300 MG capsule       Meds ordered this encounter  Medications  . HYDROcodone-acetaminophen (NORCO/VICODIN) 5-325 MG tablet    Sig: Take 1 tablet by mouth every 6 (six) hours as needed for moderate pain or severe pain.    Dispense:  30 tablet    Refill:  0    Escalation of pain medication requirements from Tramadol to Tylenol #3 and now to Norco  . gabapentin (NEURONTIN) 300 MG capsule    Sig: Take 1 capsule (300 mg  total) by mouth at bedtime for 3 days, THEN 1 capsule (300 mg total) 2 (two) times daily for 3 days, THEN 1 capsule (300 mg total) 3 (three) times daily.    Dispense:  90 capsule    Refill:  1   No orders of the defined types were placed in this encounter.   Patient Instructions  Take full amlodipine dose 5mg  daily given blood pressures are higher (likely due to pain). If pain improves and blood pressure drops, then many return to 1/2 tablet daily.  vicodin refilled today.  Try gabapentin 300mg  nightly for 3 days and if tolerating well, may  increase to twice daily, and after 3 days if needing more may increase to three times daily. Update me with how gabapentin works.   Follow up plan: Return if symptoms worsen or fail to improve.  Ria Bush, MD

## 2020-08-14 NOTE — Assessment & Plan Note (Signed)
Persistent bp elevation despite amlodipine 2.5mg  (had been taking 1/2 tab). Will increase to 5mg  daily, monitoring for worsening pedal edema. Anticipate significant ongoing back pain exacerbating hypertension.

## 2020-08-19 ENCOUNTER — Ambulatory Visit
Admission: RE | Admit: 2020-08-19 | Discharge: 2020-08-19 | Disposition: A | Discharge status: Home / Self Care | Payer: Medicare Other | Source: Ambulatory Visit | Attending: Family Medicine | Admitting: Family Medicine

## 2020-08-19 ENCOUNTER — Other Ambulatory Visit: Payer: Self-pay

## 2020-08-19 ENCOUNTER — Other Ambulatory Visit: Payer: Self-pay | Admitting: Family Medicine

## 2020-08-19 DIAGNOSIS — M5416 Radiculopathy, lumbar region: Secondary | ICD-10-CM

## 2020-08-19 DIAGNOSIS — R200 Anesthesia of skin: Secondary | ICD-10-CM

## 2020-08-19 DIAGNOSIS — G8929 Other chronic pain: Secondary | ICD-10-CM

## 2020-08-19 DIAGNOSIS — M545 Low back pain, unspecified: Secondary | ICD-10-CM | POA: Diagnosis not present

## 2020-08-19 DIAGNOSIS — M48061 Spinal stenosis, lumbar region without neurogenic claudication: Secondary | ICD-10-CM

## 2020-08-19 DIAGNOSIS — M5126 Other intervertebral disc displacement, lumbar region: Secondary | ICD-10-CM

## 2020-08-19 NOTE — Progress Notes (Signed)
Noted. Thank you for placing referral.

## 2020-08-19 NOTE — Progress Notes (Signed)
Severe spinal stenosis and foraminal stenosis at L4-L5 due to large disc herniation.  Discussed with patient and will involve Neurosurgery.     ICD-10-CM   1. Severe herniation of intervertebral disc between L4 and L5  M51.26 Ambulatory referral to Neurosurgery  2. Spinal stenosis at L4-L5 level  M48.061 Ambulatory referral to Neurosurgery  3. Foraminal stenosis of lumbar region  M48.061 Ambulatory referral to Neurosurgery  4. Numbness and tingling of left lower extremity  R20.0 Ambulatory referral to Neurosurgery   R20.2     Orders Placed This Encounter  Procedures  . Ambulatory referral to Neurosurgery    MR Lumbar Spine Wo Contrast  Result Date: 08/19/2020 CLINICAL DATA:  Low back pain.  Lumbar radiculopathy. EXAM: MRI LUMBAR SPINE WITHOUT CONTRAST TECHNIQUE: Multiplanar, multisequence MR imaging of the lumbar spine was performed. No intravenous contrast was administered. COMPARISON:  Radiographs April 24, 2020 FINDINGS: Segmentation:  Standard. Alignment: Straightening of the lumbar lordosis. Small retrolisthesis of L3 over L4 and L5 over S1. Vertebrae: No fracture, evidence of discitis, or bone lesion. Hemangioma at T12. Endplate degenerative changes at L5-S1. Conus medullaris and cauda equina: Conus extends to the L1-2 level. Conus and cauda equina appear normal. Paraspinal and other soft tissues: Negative. Disc levels: T12-L1: No spinal canal or neural foraminal stenosis. L1-2: No spinal canal or neural foraminal stenosis. L2-3: Shallow disc bulge. No spinal canal or neural foraminal stenosis. L3-4: Disc bulge and mild facet degenerative changes resulting in mild bilateral neural foraminal narrowing, right greater than left. No spinal canal stenosis. L4-5: Disc bulge with large superiorly migrating right central/subarticular disc extrusion resulting in severe spinal canal stenosis with narrowing of the bilateral subarticular zones, right greater than left, likely impinging on the exiting  right L4 and traversing right L5 nerve roots. Mild facet degenerative changes. Mild bilateral neural foraminal narrowing, right greater than left. L5-S1: Loss of disc height, disc bulge and mild facet degenerative changes resulting in mild-to-moderate bilateral neural foraminal narrowing. No spinal canal stenosis. IMPRESSION: 1. Large right central/subarticular superiorly migrating disc extrusion at L4-5 resulting in severe spinal canal stenosis and likely impinging on the exiting right L4 and traversing right L5 nerve roots. 2. Mild bilateral neural foraminal narrowing at L3-4 and L4-5. 3. Moderate bilateral neural foraminal narrowing at L5-S1. These results will be called to the ordering clinician or representative by the Radiologist Assistant, and communication documented in the PACS or [employer]. Electronically Signed   By: Pedro Earls M.D.   On: 08/19/2020 15:13

## 2020-08-21 ENCOUNTER — Other Ambulatory Visit: Payer: Self-pay | Admitting: Family Medicine

## 2020-08-21 ENCOUNTER — Telehealth: Payer: Self-pay | Admitting: *Deleted

## 2020-08-21 MED ORDER — HYDROCODONE-ACETAMINOPHEN 5-325 MG PO TABS: 1.0000 | ORAL_TABLET | Freq: Four times a day (QID) | ORAL | 0 refills | Status: DC | PRN

## 2020-08-21 NOTE — Telephone Encounter (Signed)
Patient left a voicemail stating that he needs to talk with Dr. Danise Mina before the weekend.  Called patient back and got  his voicemail. Left a message on voicemail for patient to call the office back.

## 2020-08-21 NOTE — Addendum Note (Signed)
Addended by: Brenton Grills on: 11/10/5619 30:86 PM   Modules accepted: Orders

## 2020-08-21 NOTE — Telephone Encounter (Signed)
Name of Medication: Hydrocodone-APAP Name of Pharmacy: [facility] Last Fill or Written Date and Quantity: 08/14/20, #30 Last Office Visit and Type: 08/14/20, chronic low back pain Next Office Visit and Type: 05/14/21, AWV prt 2 Last Controlled Substance Agreement Date: none Last UDS: none

## 2020-08-21 NOTE — Telephone Encounter (Signed)
Mr. Ryan Mathews called in and stated that he is out of the medication Hydrocodone.   LAST APPOINTMENT DATE: 08/21/2020   NEXT APPOINTMENT DATE:@Visit  date not found  MEDICATION: hydrocodone  PHARMACY: [facility]   Let patient know to contact pharmacy at the end of the day to make sure medication is ready.  Please notify patient to allow 48-72 hours to process  Encourage patient to contact the pharmacy for refills or they can request refills through [place]:   LAST REFILL:  QTY:  REFILL DATE:    OTHER COMMENTS:    Okay for refill?  Please advise

## 2020-08-21 NOTE — Telephone Encounter (Signed)
ERx 

## 2020-08-24 NOTE — Telephone Encounter (Signed)
See refill request.

## 2020-08-26 DIAGNOSIS — M5126 Other intervertebral disc displacement, lumbar region: Secondary | ICD-10-CM | POA: Diagnosis not present

## 2020-08-26 DIAGNOSIS — M5416 Radiculopathy, lumbar region: Secondary | ICD-10-CM | POA: Diagnosis not present

## 2020-08-28 DIAGNOSIS — M5126 Other intervertebral disc displacement, lumbar region: Secondary | ICD-10-CM | POA: Diagnosis not present

## 2020-08-28 DIAGNOSIS — M5116 Intervertebral disc disorders with radiculopathy, lumbar region: Secondary | ICD-10-CM | POA: Diagnosis not present

## 2020-09-25 DIAGNOSIS — D0471 Carcinoma in situ of skin of right lower limb, including hip: Secondary | ICD-10-CM | POA: Diagnosis not present

## 2020-09-25 DIAGNOSIS — D485 Neoplasm of uncertain behavior of skin: Secondary | ICD-10-CM | POA: Diagnosis not present

## 2020-11-01 ENCOUNTER — Encounter: Payer: Self-pay | Admitting: Internal Medicine

## 2020-11-09 ENCOUNTER — Encounter: Payer: Self-pay | Admitting: Family Medicine

## 2020-11-20 DIAGNOSIS — D0471 Carcinoma in situ of skin of right lower limb, including hip: Secondary | ICD-10-CM | POA: Diagnosis not present

## 2020-12-18 DIAGNOSIS — Z23 Encounter for immunization: Secondary | ICD-10-CM | POA: Diagnosis not present

## 2021-01-08 DIAGNOSIS — Z23 Encounter for immunization: Secondary | ICD-10-CM | POA: Diagnosis not present

## 2021-01-19 ENCOUNTER — Other Ambulatory Visit: Payer: Self-pay | Admitting: *Deleted

## 2021-01-19 DIAGNOSIS — Z87891 Personal history of nicotine dependence: Secondary | ICD-10-CM

## 2021-01-25 ENCOUNTER — Other Ambulatory Visit: Payer: Self-pay

## 2021-01-25 ENCOUNTER — Telehealth: Payer: Self-pay

## 2021-01-25 ENCOUNTER — Encounter: Payer: Self-pay | Admitting: Family Medicine

## 2021-01-25 ENCOUNTER — Ambulatory Visit (INDEPENDENT_AMBULATORY_CARE_PROVIDER_SITE_OTHER)
Admission: RE | Admit: 2021-01-25 | Discharge: 2021-01-25 | Disposition: A | Discharge status: Home / Self Care | Payer: Medicare Other | Source: Ambulatory Visit | Attending: Family Medicine | Admitting: Family Medicine

## 2021-01-25 ENCOUNTER — Ambulatory Visit (INDEPENDENT_AMBULATORY_CARE_PROVIDER_SITE_OTHER): Payer: Medicare Other | Admitting: Family Medicine

## 2021-01-25 VITALS — BP 136/84 | HR 82 | Temp 97.4°F | Ht 68.5 in | Wt 231.2 lb

## 2021-01-25 DIAGNOSIS — R600 Localized edema: Secondary | ICD-10-CM

## 2021-01-25 DIAGNOSIS — R011 Cardiac murmur, unspecified: Secondary | ICD-10-CM

## 2021-01-25 DIAGNOSIS — I10 Essential (primary) hypertension: Secondary | ICD-10-CM

## 2021-01-25 DIAGNOSIS — I511 Rupture of chordae tendineae, not elsewhere classified: Secondary | ICD-10-CM | POA: Insufficient documentation

## 2021-01-25 DIAGNOSIS — I700 Atherosclerosis of aorta: Secondary | ICD-10-CM | POA: Diagnosis not present

## 2021-01-25 DIAGNOSIS — R0609 Other forms of dyspnea: Secondary | ICD-10-CM | POA: Insufficient documentation

## 2021-01-25 DIAGNOSIS — I340 Nonrheumatic mitral (valve) insufficiency: Secondary | ICD-10-CM | POA: Insufficient documentation

## 2021-01-25 DIAGNOSIS — J439 Emphysema, unspecified: Secondary | ICD-10-CM

## 2021-01-25 DIAGNOSIS — R918 Other nonspecific abnormal finding of lung field: Secondary | ICD-10-CM | POA: Diagnosis not present

## 2021-01-25 DIAGNOSIS — Z87891 Personal history of nicotine dependence: Secondary | ICD-10-CM

## 2021-01-25 LAB — CBC WITH DIFFERENTIAL/PLATELET
Basophils Absolute: 0.1 10*3/uL (ref 0.0–0.1)
Basophils Relative: 0.7 % (ref 0.0–3.0)
Eosinophils Absolute: 0.2 10*3/uL (ref 0.0–0.7)
Eosinophils Relative: 2.5 % (ref 0.0–5.0)
HCT: 46.6 % (ref 39.0–52.0)
Hemoglobin: 15.8 g/dL (ref 13.0–17.0)
Lymphocytes Relative: 26.1 % (ref 12.0–46.0)
Lymphs Abs: 2.3 10*3/uL (ref 0.7–4.0)
MCHC: 34 g/dL (ref 30.0–36.0)
MCV: 93.5 fl (ref 78.0–100.0)
Monocytes Absolute: 0.7 10*3/uL (ref 0.1–1.0)
Monocytes Relative: 7.9 % (ref 3.0–12.0)
Neutro Abs: 5.4 10*3/uL (ref 1.4–7.7)
Neutrophils Relative %: 62.8 % (ref 43.0–77.0)
Platelets: 188 10*3/uL (ref 150.0–400.0)
RBC: 4.99 Mil/uL (ref 4.22–5.81)
RDW: 13.1 % (ref 11.5–15.5)
WBC: 8.7 10*3/uL (ref 4.0–10.5)

## 2021-01-25 LAB — BASIC METABOLIC PANEL
BUN: 20 mg/dL (ref 6–23)
CO2: 26 mEq/L (ref 19–32)
Calcium: 9.2 mg/dL (ref 8.4–10.5)
Chloride: 106 mEq/L (ref 96–112)
Creatinine, Ser: 0.91 mg/dL (ref 0.40–1.50)
GFR: 83.41 mL/min (ref >60.00)
Glucose, Bld: 101 mg/dL — ABNORMAL HIGH (ref 70–99)
Potassium: 3.3 mEq/L — ABNORMAL LOW (ref 3.5–5.1)
Sodium: 143 mEq/L (ref 135–145)

## 2021-01-25 LAB — BRAIN NATRIURETIC PEPTIDE: Pro B Natriuretic peptide (BNP): 112 pg/mL — ABNORMAL HIGH (ref 0.0–100.0)

## 2021-01-25 LAB — TSH: TSH: 3.91 u[IU]/mL (ref 0.35–5.50)

## 2021-01-25 MED ORDER — FUROSEMIDE 20 MG PO TABS: 20.0000 mg | ORAL_TABLET | Freq: Every day | ORAL | 0 refills | Status: DC | PRN

## 2021-01-25 NOTE — Telephone Encounter (Signed)
Seen today. 

## 2021-01-25 NOTE — Assessment & Plan Note (Signed)
Upcoming lung cancer screening CT planned for next month.

## 2021-01-25 NOTE — Assessment & Plan Note (Addendum)
Anticipate new mitral regurgitation causing above symptoms. Will expedite echocardiogram and discussed possible cardiology evaluation.

## 2021-01-25 NOTE — Addendum Note (Signed)
Addended by: Ria Bush on: 01/25/2021 04:43 PM   Modules accepted: Orders

## 2021-01-25 NOTE — Telephone Encounter (Signed)
I spoke with pt; pt said starting on 01/24/21 when awoke pt was SOB; like pt could not breathe. Pt said last night he had a lot of wheezing and felt tightness in his chest but no CP and no cough; pt has hx of pneumonia and pt thinks he has pneumonia again. Pt said 3 wks ago had a cold and took home covid test 2 wks ago which was neg. Pt is presently at work and cannot ck another covid test. Pt said right now he feels "fine". Pt said if he breathes deep he feels like he is out of air. Pt refuses to go to UC or ED but said he would go see Dr Darnell Level if he could get appt. I spoke with Lattie Haw CMA and Dr Darnell Level will add pt on schedule 01/25/21 at 12:30. Pt said he will be at [facility] today at 12:20 to get cked in; [facility] & ED precautions given and pt voiced understanding. Sending note to DR Baldwin Crown CMA.

## 2021-01-25 NOTE — Assessment & Plan Note (Addendum)
New over the past 24 hours in setting of recently getting over viral URI.  EKG non-acute.  Check labs and CXR.  Anticipate new murmur causing symptoms - see below  Rx lasix 20mg  PRN leg swelling.

## 2021-01-25 NOTE — Patient Instructions (Addendum)
Chest xray, EKG and labs today  Take lasix 20mg  daily for 3 days then as needed for leg swelling.  I am suspicious that possible heart valve issue may be causing your breathing issues. Next step is heart ultrasound and possible cardiology evaluation depending on results of ultrasound.   Heart Murmur A heart murmur is an extra sound that is caused by turbulent blood flow through the valves of the heart. The murmur can be heard as a "hum" or "whoosh" sound when blood flows through the heart. The heart has four areas called chambers. There is a valve for each chamber for the heart. Blood passes through a valve before leaving the chamber. Two of the valves move the blood from the upper chambers of the heart to the lower chambers of the heart (tricuspid valve and mitral valve). The other two valves (aortic valve and pulmonary valve) move the blood to the lungs and the rest of the body. The valves keep blood moving through the heart in the right direction. When the heart valves are not working properly it may cause a murmur. There are two types of heart murmurs: Innocent (benign) murmurs. Most people with this type of heart murmur do not have a heart problem. Many children have innocent heart murmurs. Your health care provider may suggest some basic tests to find out whether your murmur is an innocent murmur. If an innocent heart murmur is found, there is no need for further tests or treatment and no need to restrict activities or stop playing sports. Abnormal murmurs. These types of murmurs can occur in children and adults. Abnormal murmurs may be a sign of a more serious heart condition, such as a heart abnormality present at birth (congenital defect) or heart valve disease. What are the causes? This condition may also be caused by: Pregnancy. Fever. Overactive thyroid gland. Anemia. Exercise. Rapid growth spurts in children. What are the signs or symptoms? Innocent murmurs do not cause symptoms,  and many people with abnormal murmurs may not have symptoms. If symptoms do develop, they may include: Shortness of breath or persistent cough. Blue coloring of the skin, especially on the fingertips. Chest pain. Palpitations, or feeling a fluttering or skipped heartbeat. Fainting. Getting tired much faster than expected. Swelling in the abdomen, feet, or ankles. How is this diagnosed? This condition may be diagnosed during a routine physical or other exam. If your health care provider hears a murmur with a stethoscope, he or she will listen for: Where the murmur is located in your heart. How loud the murmur is. This may help the health care provider figure out what is causing the murmur. You may be referred to a heart specialist (cardiologist). You may also have other tests, including: Electrocardiogram (ECG or EKG). This test measures the electrical activity of your heart. Echocardiogram. This test uses high frequency sound waves to make pictures of your heart. Chest X-ray. Magnetic resonance imaging (MRI). Cardiac catheterization. This test looks at blood flow through the arteries around the heart. For children and adults who have an abnormal heart murmur and want to stay active, it is important to: Complete testing. Review test results. Receive recommendations from your health care provider. If heart disease is present, it may not be safe to play or be involved in activities that require a lot of effort and energy (are strenuous). How is this treated? Heart murmurs themselves do not need treatment. In some cases, a heart murmur may go away on its own. If an underlying  problem or disease is causing the murmur, you may need treatment. If treatment is needed, it will depend on the type and severity of the disease or heart problem causing the murmur. Treatment may include: Medicine. Surgery. Changes to your lifestyle and diet. Follow these instructions at home: Talk with your health care  provider before participating in sports or other activities that are strenuous. Learn as much as possible about your condition and any related diseases. Ask your health care provider if you may be at risk for any medical emergencies. Talk with your health care provider about what symptoms you should look out for. It is up to you to get your test results. Ask your health care provider, or the department that is doing the test, when your results will be ready. Keep all follow-up visits. This is important. Contact a health care provider if: You are frequently short of breath. You feel more tired than usual. You are having a hard time keeping up with normal activities or fitness routines. You have swelling in your ankles or feet. You notice that your heart often beats irregularly. You develop any new symptoms. Get help right away if: You have chest pain. You are having trouble breathing. You feel light-headed or you faint. Your symptoms suddenly get worse. These symptoms may represent a serious problem that is an emergency. Do not wait to see if the symptoms will go away. Get medical help right away. Call your local emergency services (911 in the [place] Do not drive yourself to the hospital. Summary Heart valves keep blood moving through the heart in the right direction. When the valves are not working properly it may cause a murmur. Innocent murmurs do not cause symptoms, and many people with abnormal murmurs may not have symptoms. You may need treatment if an underlying problem or disease is causing the heart murmur. Treatment may include medicine, surgery, and changes to your lifestyle and diet. Talk with your health care provider before participating in sports or other activities that are strenuous. Get help right away if you have chest pain or trouble breathing. This information is not intended to replace advice given to you by your health care provider. Make sure you discuss any questions  you have with your health care provider. Document Revised: 06/08/2020 Document Reviewed: 06/08/2020 Elsevier Patient Education  [place].

## 2021-01-25 NOTE — Progress Notes (Addendum)
Patient ID: Ryan Mathews, male    DOB: 08/25/1945, 75 y.o.   MRN: [id number]  This visit was conducted in person.  BP 136/84   Pulse 82   Temp (!) 97.4 F (36.3 C) (Temporal)   Ht 5' 8.5" (1.74 m)   Wt 231 lb 4 oz (104.9 kg)   SpO2 94%   BMI 34.65 kg/m    CC: dyspnea with chest tightness and wheezing Subjective:   HPI: Ryan Mathews is a 75 y.o. male presenting on 01/25/2021 for Shortness of Breath (C/o SOB, chest tightness and wheezing.  Sxs started 01/24/21.  Denies chest pain or cough. Pt concerned due to h/o pneumonia.   )   1d h/o cough with dyspnea more noticeable with exertion, chest tightness and wheezing. Some leg swelling and palpitations recently as well as some lightheadedness while walking grocery store yesterday. No orthopnea.  No significant cough, fevers/chills, head or chest congestion.   3 wks ago had cold symptoms which have since improved.  Took negative COVID test 2 wks ago.   Known COPD but not on respiratory medications. Upcoming lung cancer screening CT next month.      Relevant past medical, surgical, family and social history reviewed and updated as indicated. Interim medical history since our last visit reviewed. Allergies and medications reviewed and updated. Outpatient Medications Prior to Visit  Medication Sig Dispense Refill   amLODipine (NORVASC) 5 MG tablet TAKE 1 TABLET (5 MG TOTAL) BY MOUTH DAILY. 90 tablet 2   b complex vitamins tablet Take 1 tablet by mouth daily.     Omega-3 Fatty Acids (FISH OIL) 1000 MG CAPS Take 1 capsule (1,000 mg total) by mouth daily.     Omeprazole-Sodium Bicarbonate (ZEGERID OTC) 20-1100 MG CAPS capsule Take 1 capsule by mouth daily before breakfast. 28 each 0   vitamin C (ASCORBIC ACID) 500 MG tablet Take 500 mg by mouth daily.     cyclobenzaprine (FLEXERIL) 10 MG tablet Take 0.5-1 tablets (5-10 mg total) by mouth at bedtime as needed for muscle spasms. 30 tablet 0   gabapentin (NEURONTIN) 300 MG  capsule Take 1 capsule (300 mg total) by mouth at bedtime for 3 days, THEN 1 capsule (300 mg total) 2 (two) times daily for 3 days, THEN 1 capsule (300 mg total) 3 (three) times daily. 90 capsule 1   HYDROcodone-acetaminophen (NORCO/VICODIN) 5-325 MG tablet Take 1 tablet by mouth every 6 (six) hours as needed for moderate pain or severe pain. 30 tablet 0   No facility-administered medications prior to visit.     Per HPI unless specifically indicated in ROS section below Review of Systems  Objective:  BP 136/84   Pulse 82   Temp (!) 97.4 F (36.3 C) (Temporal)   Ht 5' 8.5" (1.74 m)   Wt 231 lb 4 oz (104.9 kg)   SpO2 94%   BMI 34.65 kg/m   Wt Readings from Last 3 Encounters:  01/25/21 231 lb 4 oz (104.9 kg)  08/14/20 219 lb 4 oz (99.5 kg)  07/20/20 223 lb 4 oz (101.3 kg)      Physical Exam Vitals and nursing note reviewed.  Constitutional:      Appearance: Normal appearance. He is not ill-appearing.  Cardiovascular:     Rate and Rhythm: Normal rate. Rhythm irregular. FrequentExtrasystoles are present.    Heart sounds: Murmur (4/6 systolic best at apex) heard.  Pulmonary:     Effort: Pulmonary effort is normal. No respiratory distress.  Breath sounds: Normal breath sounds. No wheezing, rhonchi or rales.  Musculoskeletal:     Right lower leg: Edema (1-2+) present.     Left lower leg: Edema (1-2+) present.  Skin:    General: Skin is warm and dry.     Coloration: Skin is not pale.     Findings: No rash.  Neurological:     Mental Status: He is alert.  Psychiatric:        Mood and Affect: Mood normal.        Behavior: Behavior normal.      Results for orders placed or performed in visit on 05/06/20  PSA, Medicare  Result Value Ref Range   PSA 0.84 0.10 - 4.00 ng/ml  CBC with Differential/Platelet  Result Value Ref Range   WBC 11.5 (H) 4.0 - 10.5 K/uL   RBC 5.28 4.22 - 5.81 Mil/uL   Hemoglobin 17.1 (H) 13.0 - 17.0 g/dL   HCT 49.9 39.0 - 52.0 %   MCV 94.5 78.0 -  100.0 fl   MCHC 34.4 30.0 - 36.0 g/dL   RDW 13.2 11.5 - 15.5 %   Platelets 183.0 150.0 - 400.0 K/uL   Neutrophils Relative % 74.0 43.0 - 77.0 %   Lymphocytes Relative 14.1 12.0 - 46.0 %   Monocytes Relative 8.4 3.0 - 12.0 %   Eosinophils Relative 2.9 0.0 - 5.0 %   Basophils Relative 0.6 0.0 - 3.0 %   Neutro Abs 8.5 (H) 1.4 - 7.7 K/uL   Lymphs Abs 1.6 0.7 - 4.0 K/uL   Monocytes Absolute 1.0 0.1 - 1.0 K/uL   Eosinophils Absolute 0.3 0.0 - 0.7 K/uL   Basophils Absolute 0.1 0.0 - 0.1 K/uL  Comprehensive metabolic panel  Result Value Ref Range   Sodium 138 135 - 145 mEq/L   Potassium 4.4 3.5 - 5.1 mEq/L   Chloride 102 96 - 112 mEq/L   CO2 29 19 - 32 mEq/L   Glucose, Bld 141 (H) 70 - 99 mg/dL   BUN 22 6 - 23 mg/dL   Creatinine, Ser 1.15 0.40 - 1.50 mg/dL   Total Bilirubin 0.6 0.2 - 1.2 mg/dL   Alkaline Phosphatase 67 39 - 117 U/L   AST 25 0 - 37 U/L   ALT 30 0 - 53 U/L   Total Protein 6.8 6.0 - 8.3 g/dL   Albumin 3.9 3.5 - 5.2 g/dL   GFR 63.31 >60.00 mL/min   Calcium 9.2 8.4 - 10.5 mg/dL  Lipid panel  Result Value Ref Range   Cholesterol 137 0 - 200 mg/dL   Triglycerides 124.0 0.0 - 149.0 mg/dL   HDL 35.10 (L) >39.00 mg/dL   VLDL 24.8 0.0 - 40.0 mg/dL   LDL Cholesterol 77 0 - 99 mg/dL   Total CHOL/HDL Ratio 4    NonHDL 102.19    EKG - NSR rate 80, normal axis, intervals, no hypertrophy or acute ST/T changes   Assessment & Plan:  This visit occurred during the SARS-CoV-2 public health emergency.  Safety protocols were in place, including screening questions prior to the visit, additional usage of staff PPE, and extensive cleaning of exam room while observing appropriate contact time as indicated for disinfecting solutions.   Problem List Items Addressed This Visit     Bullous emphysema ([place])    Don't think current symptoms due to COPD as no significant cough.       Ex-smoker    Upcoming lung cancer screening CT planned for next month.  Hypertension    BP stable  on amlodipine 5mg  daily.       Relevant Medications   furosemide (LASIX) 20 MG tablet   Exertional dyspnea - Primary    New over the past 24 hours in setting of recently getting over viral URI.  EKG non-acute.  Check labs and CXR.  Anticipate new murmur causing symptoms - see below  Rx lasix 20mg  PRN leg swelling.       Relevant Orders   DG Chest 2 View (Completed)   Basic metabolic panel   CBC with Differential/Platelet   Brain natriuretic peptide   TSH   ECHOCARDIOGRAM COMPLETE   Systolic murmur    Anticipate new mitral regurgitation causing above symptoms. Will expedite echocardiogram and discussed possible cardiology evaluation.       Relevant Orders   DG Chest 2 View (Completed)   EKG 12-Lead (Completed)   ECHOCARDIOGRAM COMPLETE   Other Visit Diagnoses     Pedal edema            Meds ordered this encounter  Medications   furosemide (LASIX) 20 MG tablet    Sig: Take 1 tablet (20 mg total) by mouth daily as needed for edema (leg swelling).    Dispense:  30 tablet    Refill:  0    Orders Placed This Encounter  Procedures   DG Chest 2 View    Standing Status:   Future    Number of Occurrences:   1    Standing Expiration Date:   01/25/2022    Order Specific Question:   Reason for Exam (SYMPTOM  OR DIAGNOSIS REQUIRED)    Answer:   exertional dyspnea with new murmur    Order Specific Question:   Preferred imaging location?    Answer:   [facility]   Basic metabolic panel   CBC with Differential/Platelet   Brain natriuretic peptide   TSH   EKG 12-Lead   ECHOCARDIOGRAM COMPLETE    Standing Status:   Future    Standing Expiration Date:   01/25/2022    Scheduling Instructions:     Please schedule as soon as able for new murmur and dyspnea eval for mitral regurg.    Order Specific Question:   Where should this test be performed    Answer:   [facility])    Order Specific Question:   Does the patient weigh less than or greater  than 250 lbs?    Answer:   Patient weighs less than 250 lbs    Order Specific Question:   Perflutren DEFINITY (image enhancing agent) should be administered unless hypersensitivity or allergy exist    Answer:   Administer Perflutren    Order Specific Question:   Is a special reader required? (athlete or structural heart)    Answer:   No    Order Specific Question:   Does this study need to be read by the Structural team/Level 3 readers?    Answer:   No    Order Specific Question:   Reason for exam-Echo    Answer:   Murmur R01.1    Order Specific Question:   Reason for exam-Echo    Answer:   Dyspnea  R06.00     Patient instructions: Chest xray, EKG and labs today  Take lasix 20mg  daily for 3 days then as needed for leg swelling.  I am suspicious that possible heart valve issue may be causing your breathing issues. Next step is heart  ultrasound and possible cardiology evaluation depending on results of ultrasound.   Follow up plan: Return if symptoms worsen or fail to improve.  Ria Bush, MD

## 2021-01-25 NOTE — Assessment & Plan Note (Signed)
BP stable on amlodipine 5mg  daily.

## 2021-01-25 NOTE — Assessment & Plan Note (Signed)
Don't think current symptoms due to COPD as no significant cough.

## 2021-01-25 NOTE — Telephone Encounter (Signed)
[facility] - Client TELEPHONE ADVICE RECORD [facility] Patient Name: Ryan Mathews [facility] Gender: Male DOB: Oct 20, 1945 Age: 75 Y 71 M 7 D Return Phone Number: [phone] (Primary) Address[place]  [address] Client [facility] - Client Client Site [place] Physician Ria Bush - MD Contact Type Call Who Is Calling Patient / Member / Family / Caregiver Call Type Triage / Clinical Relationship To Patient Self Return Phone Number [phone] (Primary) Chief Complaint BREATHING - shortness of breath or sounds breathless Reason for Call Symptomatic / Request for Health Information Initial Comment Caller states he's having shortness of breath, he says he has ran out of air. Translation No Nurse Assessment Nurse: Zorita Pang, RN, Deborah Date/Time (Eastern Time): 01/25/2021 9:40:36 AM Confirm and document reason for call. If symptomatic, describe symptoms. ---Caller states that he woke up with increased SOB yesterday. Hx of mild COPD. No cough. He hears wheezing. No sick exposures. Does the patient have any new or worsening symptoms? ---Yes Will a triage be completed? ---Yes Related visit to physician within the last 2 weeks? ---No Does the PT have any chronic conditions? (i.e. diabetes, asthma, this includes High risk factors for pregnancy, etc.) ---Yes List chronic conditions. ---COPD Is this a behavioral health or substance abuse call? ---No Guidelines Guideline Title Affirmed Question Affirmed Notes Nurse Date/Time (Eastern Time) Breathing Difficulty [1] MILD difficulty breathing (e.g., minimal/no SOB at rest, SOB with walking, pulse <100) AND [2] NEW-onset or WORSE than normal [place], RN, Neoma Laming 01/25/2021 9:42:54 AM PLEASE NOTE: All timestamps contained within this report are represented as [place] Standard Time. CONFIDENTIALTY NOTICE: This fax  transmission is intended only for the addressee. It contains information that is legally privileged, confidential or otherwise protected from use or disclosure. If you are not the intended recipient, you are strictly prohibited from reviewing, disclosing, copying using or disseminating any of this information or taking any action in reliance on or regarding this information. If you have received this fax in error, please notify us immediately by telephone so that we can arrange for its return to [place]. Phone: [phone], Toll-Free: [phone], Fax: [phone] Page: 2 of 2 Call Id: [id number] [place]. Time Eilene Ghazi Time) Disposition Final User 01/25/2021 9:38:16 AM Send to Urgent Queue Baruch Goldmann 01/25/2021 9:54:09 AM See HCP within 4 Hours (or PCP triage) Yes Zorita Pang, RN, Garrel Ridgel Disagree/Comply Comply Caller Understands Yes PreDisposition Call Doctor Care Advice Given Per Guideline SEE HCP (OR PCP TRIAGE) WITHIN 4 HOURS: Comments User: [name], RN Date/Time Eilene Ghazi Time): 01/25/2021 9:56:54 AM This nurse attempted the back line and no answer. This nurse explained that he could call the [place] station location or [place] to see if his MD could see him. He declined going to the ED. The phone number for [place] station and the address was given to the caller. This nurse stressed that he needs to be seen since this is something new and he verbalized understanding. Referrals REFERRED TO PCP OFFIC

## 2021-01-26 ENCOUNTER — Other Ambulatory Visit: Payer: Self-pay | Admitting: Family Medicine

## 2021-01-26 ENCOUNTER — Ambulatory Visit (HOSPITAL_COMMUNITY): Payer: Medicare Other | Attending: Cardiovascular Disease

## 2021-01-26 DIAGNOSIS — R011 Cardiac murmur, unspecified: Secondary | ICD-10-CM

## 2021-01-26 DIAGNOSIS — R0609 Other forms of dyspnea: Secondary | ICD-10-CM

## 2021-01-26 LAB — ECHOCARDIOGRAM COMPLETE
Area-P 1/2: 4.2 cm2
MV M vel: 4.79 m/s
MV Peak grad: 91.8 mmHg
MV VTI: 1.36 cm2
S' Lateral: 2.95 cm

## 2021-01-26 MED ORDER — POTASSIUM CHLORIDE CRYS ER 20 MEQ PO TBCR: 20.0000 meq | EXTENDED_RELEASE_TABLET | Freq: Every day | ORAL | 0 refills | Status: DC | PRN

## 2021-01-27 ENCOUNTER — Other Ambulatory Visit: Payer: Self-pay | Admitting: Family Medicine

## 2021-01-27 DIAGNOSIS — R0609 Other forms of dyspnea: Secondary | ICD-10-CM

## 2021-01-27 DIAGNOSIS — I511 Rupture of chordae tendineae, not elsewhere classified: Secondary | ICD-10-CM

## 2021-01-27 DIAGNOSIS — I340 Nonrheumatic mitral (valve) insufficiency: Secondary | ICD-10-CM

## 2021-01-29 ENCOUNTER — Ambulatory Visit: Payer: Medicare Other | Admitting: Family Medicine

## 2021-02-01 ENCOUNTER — Ambulatory Visit: Payer: Medicare Other | Admitting: Cardiology

## 2021-02-01 ENCOUNTER — Encounter: Payer: Self-pay | Admitting: Cardiology

## 2021-02-01 ENCOUNTER — Other Ambulatory Visit: Payer: Self-pay

## 2021-02-01 VITALS — BP 141/75 | HR 74 | Temp 98.3°F | Resp 16 | Ht 68.5 in | Wt 219.2 lb

## 2021-02-01 DIAGNOSIS — R0609 Other forms of dyspnea: Secondary | ICD-10-CM | POA: Diagnosis not present

## 2021-02-01 DIAGNOSIS — I340 Nonrheumatic mitral (valve) insufficiency: Secondary | ICD-10-CM | POA: Diagnosis not present

## 2021-02-01 DIAGNOSIS — I10 Essential (primary) hypertension: Secondary | ICD-10-CM

## 2021-02-01 DIAGNOSIS — R0989 Other specified symptoms and signs involving the circulatory and respiratory systems: Secondary | ICD-10-CM

## 2021-02-01 DIAGNOSIS — I700 Atherosclerosis of aorta: Secondary | ICD-10-CM | POA: Diagnosis not present

## 2021-02-01 DIAGNOSIS — I5031 Acute diastolic (congestive) heart failure: Secondary | ICD-10-CM | POA: Diagnosis not present

## 2021-02-01 MED ORDER — FUROSEMIDE 40 MG PO TABS: 40.0000 mg | ORAL_TABLET | Freq: Two times a day (BID) | ORAL | 1 refills | Status: DC

## 2021-02-01 MED ORDER — SPIRONOLACTONE 25 MG PO TABS: 25.0000 mg | ORAL_TABLET | Freq: Every day | ORAL | 2 refills | Status: DC

## 2021-02-01 MED ORDER — ATORVASTATIN CALCIUM 20 MG PO TABS: 20.0000 mg | ORAL_TABLET | Freq: Every day | ORAL | 2 refills | Status: DC

## 2021-02-01 MED ORDER — LOSARTAN POTASSIUM 50 MG PO TABS: 50.0000 mg | ORAL_TABLET | Freq: Every day | ORAL | 2 refills | Status: DC

## 2021-02-01 NOTE — Patient Instructions (Signed)
I will be seeing you back in the office in 1 week.  I have placed orders for blood work, do this 1 day prior to the office visit.

## 2021-02-01 NOTE — Progress Notes (Signed)
Primary Physician/Referring:  Ria Bush, MD  Patient ID: Ryan Mathews, male    DOB: [birth year], 75 y.o.   MRN: [id number]  Chief Complaint  Patient presents with   Hypertension   Hyperlipidemia   Mitral Regurgitation   New Patient (Initial Visit)    Referred by Ria Bush, MD   HPI:    Ryan Mathews  is a 75 y.o. Caucasian male with history of smoking and COPD, hypertension.  Patient presented to PCPs office 01/25/2021 with exertional dyspnea, PCP subsequently ordered echocardiogram which revealed hyperdynamic LV with LVEF of 84-13%, grade 2 diastolic dysfunction and severe mitral valve regurgitation with flail middle scallop of the posterior mitral leaflet concerning for chordal rupture. Patient was therefore referred to our office for urgent evaluation and management.   Patient is markedly dyspneic, states that symptoms started about 2 months ago with gradual worsening dyspnea, but on Sunday that is 7-8 days ago he got acutely short of breath.  Since then he has noticed orthopnea and also PND.  No chest pain or palpitations.  He does have chronic leg edema and states that this is gotten worse since he had back surgery in July 2022.  He denies any fever or chills.  He is accompanied by his wife.  Past Medical History:  Diagnosis Date   Bilateral inguinal hernia (BIH) s/p lap repair 09/06/2012 08/09/2012   Cataract    CMC arthritis, thumb, degenerative    right   COPD (chronic obstructive pulmonary disease) (HCC)    Emphysema of lung (HCC)    Ex-smoker    GERD (gastroesophageal reflux disease)    History of chicken pox    History of pneumonia 08/06/2009        Nasal septal deviation 04/14/2017   Marked   Numbness    R cheek stays numb   Obesity (BMI 30-39.9) 08/09/2012   Past Surgical History:  Procedure Laterality Date   ANKLE SURGERY Left 1966   with tendon repair with stainless steel wire   COLONOSCOPY  07/2017   multiple tubular adenomas, rpt 3 yrs  ([facility])   INSERTION OF MESH Bilateral 09/06/2012   Procedure: INSERTION OF MESH;  Surgeon: Adin Hector, MD   LAPAROSCOPIC INGUINAL HERNIA WITH UMBILICAL HERNIA Bilateral 09/06/2012   Procedure: LAPAROSCOPIC exploration and repair of hernias in bellybutton and bilateral groins with mesh;  Surgeon: Adin Hector, MD   MICRODISCECTOMY LUMBAR Right 08/2020   L4/5 (Dr Zada Finders)   Family History  Problem Relation Age of Onset   Cancer Mother        breast   Congenital heart disease Mother    Diabetes Mother    COPD Father    Cancer Sister        lung (minimal smoker)   Colon cancer Neg Hx    Esophageal cancer Neg Hx    Liver cancer Neg Hx    Rectal cancer Neg Hx    Stomach cancer Neg Hx    Pancreatic cancer Neg Hx     Social History   Tobacco Use   Smoking status: Former    Packs/day: 2.00    Years: 40.00    Pack years: 80.00    Types: Cigarettes    Start date: 03/14/1968    Quit date: 03/14/2006    Years since quitting: 14.8   Smokeless tobacco: Never  Substance Use Topics   Alcohol use: Yes    Comment: Occasionally   Marital Status: Married   ROS  Review  of Systems  Cardiovascular:  Positive for dyspnea on exertion, leg swelling (chronic), orthopnea and paroxysmal nocturnal dyspnea. Negative for chest pain.  Musculoskeletal:  Positive for back pain.  Gastrointestinal:  Negative for melena.   Objective  Blood pressure (!) 141/75, pulse 74, temperature 98.3 F (36.8 C), temperature source Temporal, resp. rate 16, height 5' 8.5" (1.74 m), weight 219 lb 3.2 oz (99.4 kg), SpO2 95 %.  Vitals with BMI 02/01/2021 01/25/2021 08/14/2020  Height 5' 8.5" 5' 8.5" -  Weight 219 lbs 3 oz 231 lbs 4 oz -  BMI 19.37 90.24 -  Systolic 097 353 299  Diastolic 75 84 72  Pulse 74 82 -     Physical Exam Constitutional:      Appearance: He is obese.  Neck:     Vascular: Carotid bruit (right) and JVD present.  Cardiovascular:     Rate and Rhythm: Normal rate and regular rhythm.      Pulses: Intact distal pulses.     Heart sounds: Murmur heard.  High-pitched blowing holosystolic murmur is present with a grade of 3/6 at the apex.    No gallop.  Pulmonary:     Effort: Pulmonary effort is normal.     Breath sounds: Examination of the right-lower field reveals rales. Examination of the left-lower field reveals rales. Rales present.  Abdominal:     General: Bowel sounds are normal.     Palpations: Abdomen is soft.  Musculoskeletal:     Right lower leg: Edema (2+ bilateral pitting edema below the knee) present.     Left lower leg: Edema (2+ bilateral pitting edema below the knee) present.    Laboratory examination:   Recent Labs    05/06/20 0733 01/25/21 1300  NA 138 143  K 4.4 3.3*  CL 102 106  CO2 29 26  GLUCOSE 141* 101*  BUN 22 20  CREATININE 1.15 0.91  CALCIUM 9.2 9.2   estimated creatinine clearance is 83.3 mL/min (by C-G formula based on SCr of 0.91 mg/dL).  CMP Latest Ref Rng & Units 01/25/2021 05/06/2020 05/17/2019  Glucose 70 - 99 mg/dL 101(H) 141(H) 87  BUN 6 - 23 mg/dL 20 22 22   Creatinine 0.40 - 1.50 mg/dL 0.91 1.15 1.05  Sodium 135 - 145 mEq/L 143 138 139  Potassium 3.5 - 5.1 mEq/L 3.3(L) 4.4 4.2  Chloride 96 - 112 mEq/L 106 102 101  CO2 19 - 32 mEq/L 26 29 29   Calcium 8.4 - 10.5 mg/dL 9.2 9.2 10.0  Total Protein 6.0 - 8.3 g/dL - 6.8 7.5  Total Bilirubin 0.2 - 1.2 mg/dL - 0.6 0.5  Alkaline Phos 39 - 117 U/L - 67 78  AST 0 - 37 U/L - 25 28  ALT 0 - 53 U/L - 30 38   CBC Latest Ref Rng & Units 01/25/2021 05/06/2020 09/17/2015  WBC 4.0 - 10.5 K/uL 8.7 11.5(H) 8.5  Hemoglobin 13.0 - 17.0 g/dL 15.8 17.1(H) 15.5  Hematocrit 39.0 - 52.0 % 46.6 49.9 45.6  Platelets 150.0 - 400.0 K/uL 188.0 183.0 226.0    Lipid Panel Recent Labs    05/06/20 0733  CHOL 137  TRIG 124.0  LDLCALC 77  VLDL 24.8  HDL 35.10*  CHOLHDL 4    HEMOGLOBIN A1C No results found for: HGBA1C, MPG TSH Recent Labs    01/25/21 1300  TSH 3.91    External labs:   None   Allergies  No Known Allergies    Medications Prior to Visit:  Outpatient Medications Prior to Visit  Medication Sig Dispense Refill   b complex vitamins tablet Take 1 tablet by mouth daily.     Omega-3 Fatty Acids (FISH OIL) 1000 MG CAPS Take 1 capsule (1,000 mg total) by mouth daily.     Omeprazole-Sodium Bicarbonate (ZEGERID OTC) 20-1100 MG CAPS capsule Take 1 capsule by mouth daily before breakfast. 28 each 0   vitamin C (ASCORBIC ACID) 500 MG tablet Take 500 mg by mouth daily.     amLODipine (NORVASC) 5 MG tablet TAKE 1 TABLET (5 MG TOTAL) BY MOUTH DAILY. 90 tablet 2   furosemide (LASIX) 20 MG tablet Take 1 tablet (20 mg total) by mouth daily as needed for edema (leg swelling). 30 tablet 0   potassium chloride SA (KLOR-CON) 20 MEQ tablet Take 1 tablet (20 mEq total) by mouth daily as needed (with lasix). 30 tablet 0   No facility-administered medications prior to visit.   Final Medications at End of Visit    Current Meds  Medication Sig   atorvastatin (LIPITOR) 20 MG tablet Take 1 tablet (20 mg total) by mouth daily.   b complex vitamins tablet Take 1 tablet by mouth daily.   losartan (COZAAR) 50 MG tablet Take 1 tablet (50 mg total) by mouth daily.   Omega-3 Fatty Acids (FISH OIL) 1000 MG CAPS Take 1 capsule (1,000 mg total) by mouth daily.   Omeprazole-Sodium Bicarbonate (ZEGERID OTC) 20-1100 MG CAPS capsule Take 1 capsule by mouth daily before breakfast.   spironolactone (ALDACTONE) 25 MG tablet Take 1 tablet (25 mg total) by mouth daily.   vitamin C (ASCORBIC ACID) 500 MG tablet Take 500 mg by mouth daily.   [DISCONTINUED] amLODipine (NORVASC) 5 MG tablet TAKE 1 TABLET (5 MG TOTAL) BY MOUTH DAILY.   [DISCONTINUED] furosemide (LASIX) 20 MG tablet Take 1 tablet (20 mg total) by mouth daily as needed for edema (leg swelling).   [DISCONTINUED] potassium chloride SA (KLOR-CON) 20 MEQ tablet Take 1 tablet (20 mEq total) by mouth daily as needed (with lasix).    Radiology:   No results found.  Chest x-ray 01/25/2021: 1. Dramatic change in the appearance of the lungs, which could be indicative of interstitial lung disease, although lung volumes are normal. Accordingly, acute atypical infection is not excluded. Further clinical evaluation is recommended. Consideration for follow-up high-resolution chest CT is suggested if clinically appropriate. 2. Aortic atherosclerosis  Cardiac Studies:   Echocardiogram 01/26/2021:  1. Left ventricular ejection fraction, by estimation, is 70 to 75%. The left ventricle has hyperdynamic function. The left ventricle has no regional wall motion abnormalities. Left ventricular diastolic parameters are consistent with Grade II diastolic dysfunction (pseudonormalization).   2. Right ventricular systolic function is normal. The right ventricular size is normal. There is severely elevated pulmonary artery systolic pressure.   3. Left atrial size was moderately dilated.   4. Suspect flail middle scallop of the posterior mitral leaflet due to chordal rupture. The mitral valve is myxomatous. Severe mitral valve regurgitation. No evidence of mitral stenosis.   5. Tricuspid valve regurgitation is mild to moderate.   6. The aortic valve is tricuspid. Aortic valve regurgitation is not visualized. No aortic stenosis is present.   EKG:   EKG 02/01/2021: Normal sinus rhythm at rate of 73 bpm, left axis deviation, left anterior fascicular block.  Incomplete right bundle branch block.   Assessment     ICD-10-CM   1. Primary hypertension  I10     2. Dyspnea on exertion  R06.09     3. Severe mitral regurgitation  I34.0 EKG 12-Lead    4. Bruit of right carotid artery  R09.89 PCV CAROTID DUPLEX (BILATERAL)    5. Aortic atherosclerosis (HCC)  I70.0 atorvastatin (LIPITOR) 20 MG tablet    6. Acute diastolic heart failure (HCC)  I50.31 furosemide (LASIX) 40 MG tablet    losartan (COZAAR) 50 MG tablet    spironolactone  (ALDACTONE) 25 MG tablet    CBC    Basic metabolic panel    Brain natriuretic peptide       Medications Discontinued During This Encounter  Medication Reason   amLODipine (NORVASC) 5 MG tablet Change in therapy   furosemide (LASIX) 20 MG tablet    potassium chloride SA (KLOR-CON) 20 MEQ tablet Change in therapy    Meds ordered this encounter  Medications   furosemide (LASIX) 40 MG tablet    Sig: Take 1 tablet (40 mg total) by mouth 2 (two) times daily. AM and 3 PM    Dispense:  60 tablet    Refill:  1   losartan (COZAAR) 50 MG tablet    Sig: Take 1 tablet (50 mg total) by mouth daily.    Dispense:  30 tablet    Refill:  2   spironolactone (ALDACTONE) 25 MG tablet    Sig: Take 1 tablet (25 mg total) by mouth daily.    Dispense:  30 tablet    Refill:  2   atorvastatin (LIPITOR) 20 MG tablet    Sig: Take 1 tablet (20 mg total) by mouth daily.    Dispense:  30 tablet    Refill:  2    Recommendations:   Ryan Mathews is a 75 y.o. hypertension, obesity referred to me on an urgent basis for evaluation of mitral regurgitation and heart failure.  Patient with worsening dyspnea on exertion for the past 2 months it got suddenly worse a week ago with PND and orthopnea.  He also has chronic leg edema that is remained stable.  He is in acute decompensated diastolic heart failure with severe mitral regurgitation.  I have personally reviewed the results of the echocardiogram and also the images of the echocardiogram, flail mitral leaflet cannot be excluded but clearly he does have severe MR.  Advised him not to return to work, advised him to stay at home and not do any yard work and to take it completely easy until seen by me.  Will increase furosemide to 40 mg twice daily, discontinue amlodipine and switch him to losartan 50 mg daily along with spironolactone 25 mg in the morning, will obtain a BMP, CBC and a BNP in 1 week and I would like to see him then.  I will also set him up for  carotid artery duplex for bruit, a urgent TEE and right and left heart catheterization as well.  He will need mitral valve repair.  But I would like to see him back in a week prior to doing this test to medically stabilize him.  In view of aortic atherosclerosis, noted on the chest x-ray, I started him on atorvastatin.  His chest x-ray findings are consistent with pulmonary edema.  Extensive discussion regarding diet, fluid restriction, salt restriction with the patient and his wife at the bedside.  This was a >60-minute office visit encounter, discussions regarding acute heart failure, valvular heart disease, evaluation of external medical records/labs and also personally reviewed the echocardiogram.   Adrian Prows, MD, [facility] 02/01/2021, 3:43 PM Office:  [phone] Fax: [phone] Pager: [phone]

## 2021-02-03 ENCOUNTER — Encounter (HOSPITAL_COMMUNITY): Payer: Self-pay | Admitting: Cardiology

## 2021-02-08 DIAGNOSIS — I5031 Acute diastolic (congestive) heart failure: Secondary | ICD-10-CM | POA: Diagnosis not present

## 2021-02-08 NOTE — H&P (View-Only) (Signed)
Primary Physician/Referring:  Ria Bush, MD  Patient ID: Ryan Mathews, male    DOB: [birth year], 75 y.o.   MRN: [id number]  Chief Complaint  Patient presents with   carotid artery duplex    Hypertension   Follow-up   HPI:    Ryan Mathews  is a 75 y.o. Caucasian male with history of smoking and COPD, hypertension.  Patient presented to PCPs office 01/25/2021 with exertional dyspnea, PCP subsequently ordered echocardiogram which revealed hyperdynamic LV with LVEF of 27-78%, grade 2 diastolic dysfunction and severe mitral valve regurgitation with flail middle scallop of the posterior mitral leaflet concerning for chordal rupture. Patient was therefore referred to our office for urgent evaluation and management.   Patient establish care with our office with Dr. Einar Gip 02/01/2021.  Dr. Einar Gip had personally reviewed echocardiogram and felt he is in acute decompensated diastolic heart failure with severe MR, flail mitral leaflet cannot be excluded.  At last office visit patient was advised not to exert himself, increased Lasix to 40 mg twice daily, switched from amlodipine to losartan 50 mg daily and added spironolactone 25 mg daily.  Unfortunately repeat BMP and CBC as well as BNP have not been done.  Patient is recommended for urgent TEE as well as right and left heart catheterization as he likely needs mitral valve repair.  Also given aortic atherosclerosis the last office visit started patient on atorvastatin and set him up for carotid artery duplex due to bruit on exam.  Patient has not had any further testing done from a cardiac standpoint as wanted to see him for today's visit first to stabilize him medically.  Patient's symptoms have improved since last office visit.  He continues to have dyspnea on exertion, but this is gotten better and bilateral leg edema has essentially resolved.  Patient is presently scheduled for left and right heart catheterization as well as TEE in the  next 1 week.  Past Medical History:  Diagnosis Date   Bilateral inguinal hernia (BIH) s/p lap repair 09/06/2012 08/09/2012   Cataract    CMC arthritis, thumb, degenerative    right   COPD (chronic obstructive pulmonary disease) (HCC)    Emphysema of lung (HCC)    Ex-smoker    GERD (gastroesophageal reflux disease)    History of chicken pox    History of pneumonia 08/06/2009        Nasal septal deviation 04/14/2017   Marked   Numbness    R cheek stays numb   Obesity (BMI 30-39.9) 08/09/2012   Past Surgical History:  Procedure Laterality Date   ANKLE SURGERY Left 1966   with tendon repair with stainless steel wire   COLONOSCOPY  07/2017   multiple tubular adenomas, rpt 3 yrs ([facility])   INSERTION OF MESH Bilateral 09/06/2012   Procedure: INSERTION OF MESH;  Surgeon: Adin Hector, MD   LAPAROSCOPIC INGUINAL HERNIA WITH UMBILICAL HERNIA Bilateral 09/06/2012   Procedure: LAPAROSCOPIC exploration and repair of hernias in bellybutton and bilateral groins with mesh;  Surgeon: Adin Hector, MD   MICRODISCECTOMY LUMBAR Right 08/2020   L4/5 (Dr Zada Finders)   Family History  Problem Relation Age of Onset   Cancer Mother        breast   Congenital heart disease Mother    Diabetes Mother    COPD Father    Cancer Sister        lung (minimal smoker)   Colon cancer Neg Hx    Esophageal cancer Neg Hx  Liver cancer Neg Hx    Rectal cancer Neg Hx    Stomach cancer Neg Hx    Pancreatic cancer Neg Hx     Social History   Tobacco Use   Smoking status: Former    Packs/day: 2.00    Years: 40.00    Pack years: 80.00    Types: Cigarettes    Start date: 03/14/1968    Quit date: 03/14/2006    Years since quitting: 14.9   Smokeless tobacco: Never  Substance Use Topics   Alcohol use: Yes    Comment: Occasionally   Marital Status: Married   ROS  Review of Systems  Cardiovascular:  Positive for dyspnea on exertion (improving), leg swelling (improved), orthopnea (improved) and  paroxysmal nocturnal dyspnea. Negative for chest pain.  Musculoskeletal:  Positive for back pain.  Gastrointestinal:  Negative for melena.   Objective  Blood pressure 132/80, pulse 72, resp. rate 16, height 5\' 8"  (1.727 m), weight 210 lb (95.3 kg), SpO2 94 %.  Vitals with BMI 02/09/2021 02/01/2021 01/25/2021  Height 5\' 8"  5' 8.5" 5' 8.5"  Weight 210 lbs 219 lbs 3 oz 231 lbs 4 oz  BMI 31.94 53.61 44.31  Systolic 540 086 761  Diastolic 80 75 84  Pulse 72 74 82     Physical Exam Vitals reviewed.  Constitutional:      Appearance: He is obese.  Neck:     Vascular: Carotid bruit (right) and JVD present.  Cardiovascular:     Rate and Rhythm: Normal rate and regular rhythm.     Pulses: Intact distal pulses.     Heart sounds: Murmur heard.  High-pitched blowing holosystolic murmur is present with a grade of 3/6 at the apex.    No gallop.  Pulmonary:     Effort: Pulmonary effort is normal.     Breath sounds: No rales.  Abdominal:     General: Bowel sounds are normal.     Palpations: Abdomen is soft.  Musculoskeletal:     Right lower leg: Edema (trace) present.     Left lower leg: Edema (trace) present.    Laboratory examination:   Recent Labs    05/06/20 0733 01/25/21 1300 02/08/21 0908  NA 138 143 136  K 4.4 3.3* 4.4  CL 102 106 98  CO2 29 26 23   GLUCOSE 141* 101* 112*  BUN 22 20 25   CREATININE 1.15 0.91 1.24  CALCIUM 9.2 9.2 9.9   estimated creatinine clearance is 59.4 mL/min (by C-G formula based on SCr of 1.24 mg/dL).  CMP Latest Ref Rng & Units 02/08/2021 01/25/2021 05/06/2020  Glucose 70 - 99 mg/dL 112(H) 101(H) 141(H)  BUN 8 - 27 mg/dL 25 20 22   Creatinine 0.76 - 1.27 mg/dL 1.24 0.91 1.15  Sodium 134 - 144 mmol/L 136 143 138  Potassium 3.5 - 5.2 mmol/L 4.4 3.3(L) 4.4  Chloride 96 - 106 mmol/L 98 106 102  CO2 20 - 29 mmol/L 23 26 29   Calcium 8.6 - 10.2 mg/dL 9.9 9.2 9.2  Total Protein 6.0 - 8.3 g/dL - - 6.8  Total Bilirubin 0.2 - 1.2 mg/dL - - 0.6   Alkaline Phos 39 - 117 U/L - - 67  AST 0 - 37 U/L - - 25  ALT 0 - 53 U/L - - 30   CBC Latest Ref Rng & Units 02/08/2021 01/25/2021 05/06/2020  WBC 3.4 - 10.8 x10E3/uL 9.7 8.7 11.5(H)  Hemoglobin 13.0 - 17.7 g/dL 18.4(H) 15.8 17.1(H)  Hematocrit 37.5 -  51.0 % 53.6(H) 46.6 49.9  Platelets 150 - 450 x10E3/uL 245 188.0 183.0    Lipid Panel Recent Labs    05/06/20 0733  CHOL 137  TRIG 124.0  LDLCALC 77  VLDL 24.8  HDL 35.10*  CHOLHDL 4    HEMOGLOBIN A1C No results found for: HGBA1C, MPG TSH Recent Labs    01/25/21 1300  TSH 3.91    External labs:  None   Allergies  No Known Allergies    Medications Prior to Visit:   Outpatient Medications Prior to Visit  Medication Sig Dispense Refill   atorvastatin (LIPITOR) 20 MG tablet Take 1 tablet (20 mg total) by mouth daily. 30 tablet 2   b complex vitamins tablet Take 1 tablet by mouth daily.     furosemide (LASIX) 40 MG tablet Take 1 tablet (40 mg total) by mouth 2 (two) times daily. AM and 3 PM 60 tablet 1   losartan (COZAAR) 50 MG tablet Take 1 tablet (50 mg total) by mouth daily. 30 tablet 2   Omega-3 Fatty Acids (FISH OIL) 1000 MG CAPS Take 1 capsule (1,000 mg total) by mouth daily.     Omeprazole-Sodium Bicarbonate (ZEGERID OTC) 20-1100 MG CAPS capsule Take 1 capsule by mouth daily before breakfast. (Patient taking differently: Take 1 capsule by mouth daily as needed (Heartburn).) 28 each 0   spironolactone (ALDACTONE) 25 MG tablet Take 1 tablet (25 mg total) by mouth daily. 30 tablet 2   vitamin C (ASCORBIC ACID) 500 MG tablet Take 500 mg by mouth daily.     No facility-administered medications prior to visit.   Final Medications at End of Visit    Current Meds  Medication Sig   atorvastatin (LIPITOR) 20 MG tablet Take 1 tablet (20 mg total) by mouth daily.   b complex vitamins tablet Take 1 tablet by mouth daily.   furosemide (LASIX) 40 MG tablet Take 1 tablet (40 mg total) by mouth 2 (two) times daily. AM and 3  PM   losartan (COZAAR) 50 MG tablet Take 1 tablet (50 mg total) by mouth daily.   Omega-3 Fatty Acids (FISH OIL) 1000 MG CAPS Take 1 capsule (1,000 mg total) by mouth daily.   Omeprazole-Sodium Bicarbonate (ZEGERID OTC) 20-1100 MG CAPS capsule Take 1 capsule by mouth daily before breakfast. (Patient taking differently: Take 1 capsule by mouth daily as needed (Heartburn).)   spironolactone (ALDACTONE) 25 MG tablet Take 1 tablet (25 mg total) by mouth daily.   vitamin C (ASCORBIC ACID) 500 MG tablet Take 500 mg by mouth daily.   Radiology:   No results found.  Chest x-ray 01/25/2021: 1. Dramatic change in the appearance of the lungs, which could be indicative of interstitial lung disease, although lung volumes are normal. Accordingly, acute atypical infection is not excluded. Further clinical evaluation is recommended. Consideration for follow-up high-resolution chest CT is suggested if clinically appropriate. 2. Aortic atherosclerosis  Cardiac Studies:   Echocardiogram 01/26/2021:  1. Left ventricular ejection fraction, by estimation, is 70 to 75%. The left ventricle has hyperdynamic function. The left ventricle has no regional wall motion abnormalities. Left ventricular diastolic parameters are consistent with Grade II diastolic dysfunction (pseudonormalization).   2. Right ventricular systolic function is normal. The right ventricular size is normal. There is severely elevated pulmonary artery systolic pressure.   3. Left atrial size was moderately dilated.   4. Suspect flail middle scallop of the posterior mitral leaflet due to chordal rupture. The mitral valve is myxomatous. Severe mitral  valve regurgitation. No evidence of mitral stenosis.   5. Tricuspid valve regurgitation is mild to moderate.   6. The aortic valve is tricuspid. Aortic valve regurgitation is not visualized. No aortic stenosis is present.   EKG:   EKG 02/01/2021: Normal sinus rhythm at rate of 73 bpm, left axis  deviation, left anterior fascicular block.  Incomplete right bundle branch block.   Assessment     ICD-10-CM   1. Severe mitral regurgitation  I34.0     2. Acute diastolic heart failure (HCC)  I50.31     3. Primary hypertension  I10     4. Aortic atherosclerosis (HCC)  I70.0        There are no discontinued medications.   No orders of the defined types were placed in this encounter.   Recommendations:   SAMAEL BLADES is a 75 y.o. hypertension, obesity referred to me on an urgent basis for evaluation of mitral regurgitation and heart failure.  Patient establish care with our office with Dr. Einar Gip 02/01/2021.  Dr. Einar Gip had personally reviewed echocardiogram and felt he is in acute decompensated diastolic heart failure with severe MR, flail mitral leaflet cannot be excluded.  At last office visit patient was advised not to exert himself, increased Lasix to 40 mg twice daily, switched from amlodipine to losartan 50 mg daily and added spironolactone 25 mg daily.  Unfortunately repeat BMP and CBC as well as BNP have not been done.  Patient is recommended for urgent TEE as well as right and left heart catheterization as he likely needs mitral valve repair.  Also given aortic atherosclerosis the last office visit started patient on atorvastatin and set him up for carotid artery duplex due to bruit on exam.  Patient presents for 1 week follow-up.  Clinically patient's volume status has significantly improved.  Bilateral leg edema has essentially resolved and BNP is now normal.  We will proceed with TEE and right and left heart catheterization as scheduled given stable CBC and BMP.  Advised patient to reduce Lasix to 40 mg once daily as symptoms and edema have significantly improved.  Suspect patient's dyspnea is a factorial including underlying pulmonary etiology given patient's history of COPD.  Patient's chest x-ray and CT abnormalities are concerning for pulmonary edema, however symptoms  are disproportionate to findings.  Suspect patient may have component of underlying pulmonary fibrosis.  Given suspicion for pulmonary etiology contributing to patient's symptoms as well as potential need for atrial valve repair in the future will refer patient for pulmonology evaluation at this time.  Follow-up after cardiac catheterization.  Patient was seen in collaboration with Dr. Einar Gip and he is in agreement with the plan.     Alethia Berthold, PA-C 02/09/2021, 4:47 PM Office: [phone]

## 2021-02-08 NOTE — Progress Notes (Signed)
Primary Physician/Referring:  Ria Bush, MD  Patient ID: Ryan Mathews, male    DOB: 06-04-1945, 75 y.o.   MRN: [id number]  Chief Complaint  Patient presents with   carotid artery duplex    Hypertension   Follow-up   HPI:    Ryan Mathews  is a 75 y.o. Caucasian male with history of smoking and COPD, hypertension.  Patient presented to PCPs office 01/25/2021 with exertional dyspnea, PCP subsequently ordered echocardiogram which revealed hyperdynamic LV with LVEF of 25-42%, grade 2 diastolic dysfunction and severe mitral valve regurgitation with flail middle scallop of the posterior mitral leaflet concerning for chordal rupture. Patient was therefore referred to our office for urgent evaluation and management.   Patient establish care with our office with Dr. Einar Gip 02/01/2021.  Dr. Einar Gip had personally reviewed echocardiogram and felt he is in acute decompensated diastolic heart failure with severe MR, flail mitral leaflet cannot be excluded.  At last office visit patient was advised not to exert himself, increased Lasix to 40 mg twice daily, switched from amlodipine to losartan 50 mg daily and added spironolactone 25 mg daily.  Unfortunately repeat BMP and CBC as well as BNP have not been done.  Patient is recommended for urgent TEE as well as right and left heart catheterization as he likely needs mitral valve repair.  Also given aortic atherosclerosis the last office visit started patient on atorvastatin and set him up for carotid artery duplex due to bruit on exam.  Patient has not had any further testing done from a cardiac standpoint as wanted to see him for today's visit first to stabilize him medically.  Patient's symptoms have improved since last office visit.  He continues to have dyspnea on exertion, but this is gotten better and bilateral leg edema has essentially resolved.  Patient is presently scheduled for left and right heart catheterization as well as TEE in the  next 1 week.  Past Medical History:  Diagnosis Date   Bilateral inguinal hernia (BIH) s/p lap repair 09/06/2012 08/09/2012   Cataract    CMC arthritis, thumb, degenerative    right   COPD (chronic obstructive pulmonary disease) (HCC)    Emphysema of lung (HCC)    Ex-smoker    GERD (gastroesophageal reflux disease)    History of chicken pox    History of pneumonia 08/06/2009        Nasal septal deviation 04/14/2017   Marked   Numbness    R cheek stays numb   Obesity (BMI 30-39.9) 08/09/2012   Past Surgical History:  Procedure Laterality Date   ANKLE SURGERY Left 1966   with tendon repair with stainless steel wire   COLONOSCOPY  07/2017   multiple tubular adenomas, rpt 3 yrs ([facility])   INSERTION OF MESH Bilateral 09/06/2012   Procedure: INSERTION OF MESH;  Surgeon: Adin Hector, MD   LAPAROSCOPIC INGUINAL HERNIA WITH UMBILICAL HERNIA Bilateral 09/06/2012   Procedure: LAPAROSCOPIC exploration and repair of hernias in bellybutton and bilateral groins with mesh;  Surgeon: Adin Hector, MD   MICRODISCECTOMY LUMBAR Right 08/2020   L4/5 (Dr Zada Finders)   Family History  Problem Relation Age of Onset   Cancer Mother        breast   Congenital heart disease Mother    Diabetes Mother    COPD Father    Cancer Sister        lung (minimal smoker)   Colon cancer Neg Hx    Esophageal cancer Neg Hx  Liver cancer Neg Hx    Rectal cancer Neg Hx    Stomach cancer Neg Hx    Pancreatic cancer Neg Hx     Social History   Tobacco Use   Smoking status: Former    Packs/day: 2.00    Years: 40.00    Pack years: 80.00    Types: Cigarettes    Start date: 03/14/1968    Quit date: 03/14/2006    Years since quitting: 14.9   Smokeless tobacco: Never  Substance Use Topics   Alcohol use: Yes    Comment: Occasionally   Marital Status: Married   ROS  Review of Systems  Cardiovascular:  Positive for dyspnea on exertion (improving), leg swelling (improved), orthopnea (improved) and  paroxysmal nocturnal dyspnea. Negative for chest pain.  Musculoskeletal:  Positive for back pain.  Gastrointestinal:  Negative for melena.   Objective  Blood pressure 132/80, pulse 72, resp. rate 16, height 5\' 8"  (1.727 m), weight 210 lb (95.3 kg), SpO2 94 %.  Vitals with BMI 02/09/2021 02/01/2021 01/25/2021  Height 5\' 8"  5' 8.5" 5' 8.5"  Weight 210 lbs 219 lbs 3 oz 231 lbs 4 oz  BMI 31.94 42.87 68.11  Systolic 572 620 355  Diastolic 80 75 84  Pulse 72 74 82     Physical Exam Vitals reviewed.  Constitutional:      Appearance: He is obese.  Neck:     Vascular: Carotid bruit (right) and JVD present.  Cardiovascular:     Rate and Rhythm: Normal rate and regular rhythm.     Pulses: Intact distal pulses.     Heart sounds: Murmur heard.  High-pitched blowing holosystolic murmur is present with a grade of 3/6 at the apex.    No gallop.  Pulmonary:     Effort: Pulmonary effort is normal.     Breath sounds: No rales.  Abdominal:     General: Bowel sounds are normal.     Palpations: Abdomen is soft.  Musculoskeletal:     Right lower leg: Edema (trace) present.     Left lower leg: Edema (trace) present.    Laboratory examination:   Recent Labs    05/06/20 0733 01/25/21 1300 02/08/21 0908  NA 138 143 136  K 4.4 3.3* 4.4  CL 102 106 98  CO2 29 26 23   GLUCOSE 141* 101* 112*  BUN 22 20 25   CREATININE 1.15 0.91 1.24  CALCIUM 9.2 9.2 9.9   estimated creatinine clearance is 59.4 mL/min (by C-G formula based on SCr of 1.24 mg/dL).  CMP Latest Ref Rng & Units 02/08/2021 01/25/2021 05/06/2020  Glucose 70 - 99 mg/dL 112(H) 101(H) 141(H)  BUN 8 - 27 mg/dL 25 20 22   Creatinine 0.76 - 1.27 mg/dL 1.24 0.91 1.15  Sodium 134 - 144 mmol/L 136 143 138  Potassium 3.5 - 5.2 mmol/L 4.4 3.3(L) 4.4  Chloride 96 - 106 mmol/L 98 106 102  CO2 20 - 29 mmol/L 23 26 29   Calcium 8.6 - 10.2 mg/dL 9.9 9.2 9.2  Total Protein 6.0 - 8.3 g/dL - - 6.8  Total Bilirubin 0.2 - 1.2 mg/dL - - 0.6   Alkaline Phos 39 - 117 U/L - - 67  AST 0 - 37 U/L - - 25  ALT 0 - 53 U/L - - 30   CBC Latest Ref Rng & Units 02/08/2021 01/25/2021 05/06/2020  WBC 3.4 - 10.8 x10E3/uL 9.7 8.7 11.5(H)  Hemoglobin 13.0 - 17.7 g/dL 18.4(H) 15.8 17.1(H)  Hematocrit 37.5 -  51.0 % 53.6(H) 46.6 49.9  Platelets 150 - 450 x10E3/uL 245 188.0 183.0    Lipid Panel Recent Labs    05/06/20 0733  CHOL 137  TRIG 124.0  LDLCALC 77  VLDL 24.8  HDL 35.10*  CHOLHDL 4    HEMOGLOBIN A1C No results found for: HGBA1C, MPG TSH Recent Labs    01/25/21 1300  TSH 3.91    External labs:  None   Allergies  No Known Allergies    Medications Prior to Visit:   Outpatient Medications Prior to Visit  Medication Sig Dispense Refill   atorvastatin (LIPITOR) 20 MG tablet Take 1 tablet (20 mg total) by mouth daily. 30 tablet 2   b complex vitamins tablet Take 1 tablet by mouth daily.     furosemide (LASIX) 40 MG tablet Take 1 tablet (40 mg total) by mouth 2 (two) times daily. AM and 3 PM 60 tablet 1   losartan (COZAAR) 50 MG tablet Take 1 tablet (50 mg total) by mouth daily. 30 tablet 2   Omega-3 Fatty Acids (FISH OIL) 1000 MG CAPS Take 1 capsule (1,000 mg total) by mouth daily.     Omeprazole-Sodium Bicarbonate (ZEGERID OTC) 20-1100 MG CAPS capsule Take 1 capsule by mouth daily before breakfast. (Patient taking differently: Take 1 capsule by mouth daily as needed (Heartburn).) 28 each 0   spironolactone (ALDACTONE) 25 MG tablet Take 1 tablet (25 mg total) by mouth daily. 30 tablet 2   vitamin C (ASCORBIC ACID) 500 MG tablet Take 500 mg by mouth daily.     No facility-administered medications prior to visit.   Final Medications at End of Visit    Current Meds  Medication Sig   atorvastatin (LIPITOR) 20 MG tablet Take 1 tablet (20 mg total) by mouth daily.   b complex vitamins tablet Take 1 tablet by mouth daily.   furosemide (LASIX) 40 MG tablet Take 1 tablet (40 mg total) by mouth 2 (two) times daily. AM and 3  PM   losartan (COZAAR) 50 MG tablet Take 1 tablet (50 mg total) by mouth daily.   Omega-3 Fatty Acids (FISH OIL) 1000 MG CAPS Take 1 capsule (1,000 mg total) by mouth daily.   Omeprazole-Sodium Bicarbonate (ZEGERID OTC) 20-1100 MG CAPS capsule Take 1 capsule by mouth daily before breakfast. (Patient taking differently: Take 1 capsule by mouth daily as needed (Heartburn).)   spironolactone (ALDACTONE) 25 MG tablet Take 1 tablet (25 mg total) by mouth daily.   vitamin C (ASCORBIC ACID) 500 MG tablet Take 500 mg by mouth daily.   Radiology:   No results found.  Chest x-ray 01/25/2021: 1. Dramatic change in the appearance of the lungs, which could be indicative of interstitial lung disease, although lung volumes are normal. Accordingly, acute atypical infection is not excluded. Further clinical evaluation is recommended. Consideration for follow-up high-resolution chest CT is suggested if clinically appropriate. 2. Aortic atherosclerosis  Cardiac Studies:   Echocardiogram 01/26/2021:  1. Left ventricular ejection fraction, by estimation, is 70 to 75%. The left ventricle has hyperdynamic function. The left ventricle has no regional wall motion abnormalities. Left ventricular diastolic parameters are consistent with Grade II diastolic dysfunction (pseudonormalization).   2. Right ventricular systolic function is normal. The right ventricular size is normal. There is severely elevated pulmonary artery systolic pressure.   3. Left atrial size was moderately dilated.   4. Suspect flail middle scallop of the posterior mitral leaflet due to chordal rupture. The mitral valve is myxomatous. Severe mitral  valve regurgitation. No evidence of mitral stenosis.   5. Tricuspid valve regurgitation is mild to moderate.   6. The aortic valve is tricuspid. Aortic valve regurgitation is not visualized. No aortic stenosis is present.   EKG:   EKG 02/01/2021: Normal sinus rhythm at rate of 73 bpm, left axis  deviation, left anterior fascicular block.  Incomplete right bundle branch block.   Assessment     ICD-10-CM   1. Severe mitral regurgitation  I34.0     2. Acute diastolic heart failure (HCC)  I50.31     3. Primary hypertension  I10     4. Aortic atherosclerosis (HCC)  I70.0        There are no discontinued medications.   No orders of the defined types were placed in this encounter.   Recommendations:   Ryan Mathews is a 75 y.o. hypertension, obesity referred to me on an urgent basis for evaluation of mitral regurgitation and heart failure.  Patient establish care with our office with Dr. Einar Gip 02/01/2021.  Dr. Einar Gip had personally reviewed echocardiogram and felt he is in acute decompensated diastolic heart failure with severe MR, flail mitral leaflet cannot be excluded.  At last office visit patient was advised not to exert himself, increased Lasix to 40 mg twice daily, switched from amlodipine to losartan 50 mg daily and added spironolactone 25 mg daily.  Unfortunately repeat BMP and CBC as well as BNP have not been done.  Patient is recommended for urgent TEE as well as right and left heart catheterization as he likely needs mitral valve repair.  Also given aortic atherosclerosis the last office visit started patient on atorvastatin and set him up for carotid artery duplex due to bruit on exam.  Patient presents for 1 week follow-up.  Clinically patient's volume status has significantly improved.  Bilateral leg edema has essentially resolved and BNP is now normal.  We will proceed with TEE and right and left heart catheterization as scheduled given stable CBC and BMP.  Advised patient to reduce Lasix to 40 mg once daily as symptoms and edema have significantly improved.  Suspect patient's dyspnea is a factorial including underlying pulmonary etiology given patient's history of COPD.  Patient's chest x-ray and CT abnormalities are concerning for pulmonary edema, however symptoms  are disproportionate to findings.  Suspect patient may have component of underlying pulmonary fibrosis.  Given suspicion for pulmonary etiology contributing to patient's symptoms as well as potential need for atrial valve repair in the future will refer patient for pulmonology evaluation at this time.  Follow-up after cardiac catheterization.  Patient was seen in collaboration with Dr. Einar Gip and he is in agreement with the plan.     Alethia Berthold, PA-C 02/09/2021, 4:47 PM Office: [phone]

## 2021-02-08 NOTE — H&P (View-Only) (Signed)
Primary Physician/Referring:  Ria Bush, MD  Patient ID: Ryan Mathews, male    DOB: 10/31/1945, 75 y.o.   MRN: [id number]  Chief Complaint  Patient presents with   carotid artery duplex    Hypertension   Follow-up   HPI:    ALBINO BUFFORD  is a 75 y.o. Caucasian male with history of smoking and COPD, hypertension.  Patient presented to PCPs office 01/25/2021 with exertional dyspnea, PCP subsequently ordered echocardiogram which revealed hyperdynamic LV with LVEF of 76-54%, grade 2 diastolic dysfunction and severe mitral valve regurgitation with flail middle scallop of the posterior mitral leaflet concerning for chordal rupture. Patient was therefore referred to our office for urgent evaluation and management.   Patient establish care with our office with Dr. Einar Gip 02/01/2021.  Dr. Einar Gip had personally reviewed echocardiogram and felt he is in acute decompensated diastolic heart failure with severe MR, flail mitral leaflet cannot be excluded.  At last office visit patient was advised not to exert himself, increased Lasix to 40 mg twice daily, switched from amlodipine to losartan 50 mg daily and added spironolactone 25 mg daily.  Unfortunately repeat BMP and CBC as well as BNP have not been done.  Patient is recommended for urgent TEE as well as right and left heart catheterization as he likely needs mitral valve repair.  Also given aortic atherosclerosis the last office visit started patient on atorvastatin and set him up for carotid artery duplex due to bruit on exam.  Patient has not had any further testing done from a cardiac standpoint as wanted to see him for today's visit first to stabilize him medically.  Patient's symptoms have improved since last office visit.  He continues to have dyspnea on exertion, but this is gotten better and bilateral leg edema has essentially resolved.  Patient is presently scheduled for left and right heart catheterization as well as TEE in the  next 1 week.  Past Medical History:  Diagnosis Date   Bilateral inguinal hernia (BIH) s/p lap repair 09/06/2012 08/09/2012   Cataract    CMC arthritis, thumb, degenerative    right   COPD (chronic obstructive pulmonary disease) (HCC)    Emphysema of lung (HCC)    Ex-smoker    GERD (gastroesophageal reflux disease)    History of chicken pox    History of pneumonia 08/06/2009        Nasal septal deviation 04/14/2017   Marked   Numbness    R cheek stays numb   Obesity (BMI 30-39.9) 08/09/2012   Past Surgical History:  Procedure Laterality Date   ANKLE SURGERY Left 1966   with tendon repair with stainless steel wire   COLONOSCOPY  07/2017   multiple tubular adenomas, rpt 3 yrs ([facility])   INSERTION OF MESH Bilateral 09/06/2012   Procedure: INSERTION OF MESH;  Surgeon: Adin Hector, MD   LAPAROSCOPIC INGUINAL HERNIA WITH UMBILICAL HERNIA Bilateral 09/06/2012   Procedure: LAPAROSCOPIC exploration and repair of hernias in bellybutton and bilateral groins with mesh;  Surgeon: Adin Hector, MD   MICRODISCECTOMY LUMBAR Right 08/2020   L4/5 (Dr Zada Finders)   Family History  Problem Relation Age of Onset   Cancer Mother        breast   Congenital heart disease Mother    Diabetes Mother    COPD Father    Cancer Sister        lung (minimal smoker)   Colon cancer Neg Hx    Esophageal cancer Neg Hx  Liver cancer Neg Hx    Rectal cancer Neg Hx    Stomach cancer Neg Hx    Pancreatic cancer Neg Hx     Social History   Tobacco Use   Smoking status: Former    Packs/day: 2.00    Years: 40.00    Pack years: 80.00    Types: Cigarettes    Start date: 03/14/1968    Quit date: 03/14/2006    Years since quitting: 14.9   Smokeless tobacco: Never  Substance Use Topics   Alcohol use: Yes    Comment: Occasionally   Marital Status: Married   ROS  Review of Systems  Cardiovascular:  Positive for dyspnea on exertion (improving), leg swelling (improved), orthopnea (improved) and  paroxysmal nocturnal dyspnea. Negative for chest pain.  Musculoskeletal:  Positive for back pain.  Gastrointestinal:  Negative for melena.   Objective  Blood pressure 132/80, pulse 72, resp. rate 16, height 5\' 8"  (1.727 m), weight 210 lb (95.3 kg), SpO2 94 %.  Vitals with BMI 02/09/2021 02/01/2021 01/25/2021  Height 5\' 8"  5' 8.5" 5' 8.5"  Weight 210 lbs 219 lbs 3 oz 231 lbs 4 oz  BMI 31.94 16.60 63.01  Systolic 601 093 235  Diastolic 80 75 84  Pulse 72 74 82     Physical Exam Vitals reviewed.  Constitutional:      Appearance: He is obese.  Neck:     Vascular: Carotid bruit (right) and JVD present.  Cardiovascular:     Rate and Rhythm: Normal rate and regular rhythm.     Pulses: Intact distal pulses.     Heart sounds: Murmur heard.  High-pitched blowing holosystolic murmur is present with a grade of 3/6 at the apex.    No gallop.  Pulmonary:     Effort: Pulmonary effort is normal.     Breath sounds: No rales.  Abdominal:     General: Bowel sounds are normal.     Palpations: Abdomen is soft.  Musculoskeletal:     Right lower leg: Edema (trace) present.     Left lower leg: Edema (trace) present.    Laboratory examination:   Recent Labs    05/06/20 0733 01/25/21 1300 02/08/21 0908  NA 138 143 136  K 4.4 3.3* 4.4  CL 102 106 98  CO2 29 26 23   GLUCOSE 141* 101* 112*  BUN 22 20 25   CREATININE 1.15 0.91 1.24  CALCIUM 9.2 9.2 9.9   estimated creatinine clearance is 59.4 mL/min (by C-G formula based on SCr of 1.24 mg/dL).  CMP Latest Ref Rng & Units 02/08/2021 01/25/2021 05/06/2020  Glucose 70 - 99 mg/dL 112(H) 101(H) 141(H)  BUN 8 - 27 mg/dL 25 20 22   Creatinine 0.76 - 1.27 mg/dL 1.24 0.91 1.15  Sodium 134 - 144 mmol/L 136 143 138  Potassium 3.5 - 5.2 mmol/L 4.4 3.3(L) 4.4  Chloride 96 - 106 mmol/L 98 106 102  CO2 20 - 29 mmol/L 23 26 29   Calcium 8.6 - 10.2 mg/dL 9.9 9.2 9.2  Total Protein 6.0 - 8.3 g/dL - - 6.8  Total Bilirubin 0.2 - 1.2 mg/dL - - 0.6   Alkaline Phos 39 - 117 U/L - - 67  AST 0 - 37 U/L - - 25  ALT 0 - 53 U/L - - 30   CBC Latest Ref Rng & Units 02/08/2021 01/25/2021 05/06/2020  WBC 3.4 - 10.8 x10E3/uL 9.7 8.7 11.5(H)  Hemoglobin 13.0 - 17.7 g/dL 18.4(H) 15.8 17.1(H)  Hematocrit 37.5 -  51.0 % 53.6(H) 46.6 49.9  Platelets 150 - 450 x10E3/uL 245 188.0 183.0    Lipid Panel Recent Labs    05/06/20 0733  CHOL 137  TRIG 124.0  LDLCALC 77  VLDL 24.8  HDL 35.10*  CHOLHDL 4    HEMOGLOBIN A1C No results found for: HGBA1C, MPG TSH Recent Labs    01/25/21 1300  TSH 3.91    External labs:  None   Allergies  No Known Allergies    Medications Prior to Visit:   Outpatient Medications Prior to Visit  Medication Sig Dispense Refill   atorvastatin (LIPITOR) 20 MG tablet Take 1 tablet (20 mg total) by mouth daily. 30 tablet 2   b complex vitamins tablet Take 1 tablet by mouth daily.     furosemide (LASIX) 40 MG tablet Take 1 tablet (40 mg total) by mouth 2 (two) times daily. AM and 3 PM 60 tablet 1   losartan (COZAAR) 50 MG tablet Take 1 tablet (50 mg total) by mouth daily. 30 tablet 2   Omega-3 Fatty Acids (FISH OIL) 1000 MG CAPS Take 1 capsule (1,000 mg total) by mouth daily.     Omeprazole-Sodium Bicarbonate (ZEGERID OTC) 20-1100 MG CAPS capsule Take 1 capsule by mouth daily before breakfast. (Patient taking differently: Take 1 capsule by mouth daily as needed (Heartburn).) 28 each 0   spironolactone (ALDACTONE) 25 MG tablet Take 1 tablet (25 mg total) by mouth daily. 30 tablet 2   vitamin C (ASCORBIC ACID) 500 MG tablet Take 500 mg by mouth daily.     No facility-administered medications prior to visit.   Final Medications at End of Visit    Current Meds  Medication Sig   atorvastatin (LIPITOR) 20 MG tablet Take 1 tablet (20 mg total) by mouth daily.   b complex vitamins tablet Take 1 tablet by mouth daily.   furosemide (LASIX) 40 MG tablet Take 1 tablet (40 mg total) by mouth 2 (two) times daily. AM and 3  PM   losartan (COZAAR) 50 MG tablet Take 1 tablet (50 mg total) by mouth daily.   Omega-3 Fatty Acids (FISH OIL) 1000 MG CAPS Take 1 capsule (1,000 mg total) by mouth daily.   Omeprazole-Sodium Bicarbonate (ZEGERID OTC) 20-1100 MG CAPS capsule Take 1 capsule by mouth daily before breakfast. (Patient taking differently: Take 1 capsule by mouth daily as needed (Heartburn).)   spironolactone (ALDACTONE) 25 MG tablet Take 1 tablet (25 mg total) by mouth daily.   vitamin C (ASCORBIC ACID) 500 MG tablet Take 500 mg by mouth daily.   Radiology:   No results found.  Chest x-ray 01/25/2021: 1. Dramatic change in the appearance of the lungs, which could be indicative of interstitial lung disease, although lung volumes are normal. Accordingly, acute atypical infection is not excluded. Further clinical evaluation is recommended. Consideration for follow-up high-resolution chest CT is suggested if clinically appropriate. 2. Aortic atherosclerosis  Cardiac Studies:   Echocardiogram 01/26/2021:  1. Left ventricular ejection fraction, by estimation, is 70 to 75%. The left ventricle has hyperdynamic function. The left ventricle has no regional wall motion abnormalities. Left ventricular diastolic parameters are consistent with Grade II diastolic dysfunction (pseudonormalization).   2. Right ventricular systolic function is normal. The right ventricular size is normal. There is severely elevated pulmonary artery systolic pressure.   3. Left atrial size was moderately dilated.   4. Suspect flail middle scallop of the posterior mitral leaflet due to chordal rupture. The mitral valve is myxomatous. Severe mitral  valve regurgitation. No evidence of mitral stenosis.   5. Tricuspid valve regurgitation is mild to moderate.   6. The aortic valve is tricuspid. Aortic valve regurgitation is not visualized. No aortic stenosis is present.   EKG:   EKG 02/01/2021: Normal sinus rhythm at rate of 73 bpm, left axis  deviation, left anterior fascicular block.  Incomplete right bundle branch block.   Assessment     ICD-10-CM   1. Severe mitral regurgitation  I34.0     2. Acute diastolic heart failure (HCC)  I50.31     3. Primary hypertension  I10     4. Aortic atherosclerosis (HCC)  I70.0        There are no discontinued medications.   No orders of the defined types were placed in this encounter.   Recommendations:   TYQWAN PINK is a 75 y.o. hypertension, obesity referred to me on an urgent basis for evaluation of mitral regurgitation and heart failure.  Patient establish care with our office with Dr. Einar Gip 02/01/2021.  Dr. Einar Gip had personally reviewed echocardiogram and felt he is in acute decompensated diastolic heart failure with severe MR, flail mitral leaflet cannot be excluded.  At last office visit patient was advised not to exert himself, increased Lasix to 40 mg twice daily, switched from amlodipine to losartan 50 mg daily and added spironolactone 25 mg daily.  Unfortunately repeat BMP and CBC as well as BNP have not been done.  Patient is recommended for urgent TEE as well as right and left heart catheterization as he likely needs mitral valve repair.  Also given aortic atherosclerosis the last office visit started patient on atorvastatin and set him up for carotid artery duplex due to bruit on exam.  Patient presents for 1 week follow-up.  Clinically patient's volume status has significantly improved.  Bilateral leg edema has essentially resolved and BNP is now normal.  We will proceed with TEE and right and left heart catheterization as scheduled given stable CBC and BMP.  Advised patient to reduce Lasix to 40 mg once daily as symptoms and edema have significantly improved.  Suspect patient's dyspnea is a factorial including underlying pulmonary etiology given patient's history of COPD.  Patient's chest x-ray and CT abnormalities are concerning for pulmonary edema, however symptoms  are disproportionate to findings.  Suspect patient may have component of underlying pulmonary fibrosis.  Given suspicion for pulmonary etiology contributing to patient's symptoms as well as potential need for atrial valve repair in the future will refer patient for pulmonology evaluation at this time.  Follow-up after cardiac catheterization.  Patient was seen in collaboration with Dr. Einar Gip and he is in agreement with the plan.     Alethia Berthold, PA-C 02/09/2021, 4:47 PM Office: [phone]

## 2021-02-09 ENCOUNTER — Other Ambulatory Visit: Payer: Self-pay

## 2021-02-09 ENCOUNTER — Encounter: Payer: Self-pay | Admitting: Student

## 2021-02-09 ENCOUNTER — Ambulatory Visit: Payer: Medicare Other | Admitting: Student

## 2021-02-09 ENCOUNTER — Ambulatory Visit: Payer: Medicare Other

## 2021-02-09 VITALS — BP 132/80 | HR 72 | Resp 16 | Ht 68.0 in | Wt 210.0 lb

## 2021-02-09 DIAGNOSIS — J449 Chronic obstructive pulmonary disease, unspecified: Secondary | ICD-10-CM | POA: Diagnosis not present

## 2021-02-09 DIAGNOSIS — I5031 Acute diastolic (congestive) heart failure: Secondary | ICD-10-CM | POA: Diagnosis not present

## 2021-02-09 DIAGNOSIS — I340 Nonrheumatic mitral (valve) insufficiency: Secondary | ICD-10-CM | POA: Diagnosis not present

## 2021-02-09 DIAGNOSIS — R0609 Other forms of dyspnea: Secondary | ICD-10-CM

## 2021-02-09 DIAGNOSIS — R0989 Other specified symptoms and signs involving the circulatory and respiratory systems: Secondary | ICD-10-CM

## 2021-02-09 DIAGNOSIS — I700 Atherosclerosis of aorta: Secondary | ICD-10-CM

## 2021-02-09 DIAGNOSIS — I10 Essential (primary) hypertension: Secondary | ICD-10-CM | POA: Diagnosis not present

## 2021-02-09 LAB — BRAIN NATRIURETIC PEPTIDE: BNP: 11 pg/mL (ref 0.0–100.0)

## 2021-02-09 LAB — CBC
Hematocrit: 53.6 % — ABNORMAL HIGH (ref 37.5–51.0)
Hemoglobin: 18.4 g/dL — ABNORMAL HIGH (ref 13.0–17.7)
MCH: 32 pg (ref 26.6–33.0)
MCHC: 34.3 g/dL (ref 31.5–35.7)
MCV: 93 fL (ref 79–97)
Platelets: 245 10*3/uL (ref 150–450)
RBC: 5.75 x10E6/uL (ref 4.14–5.80)
RDW: 12.2 % (ref 11.6–15.4)
WBC: 9.7 10*3/uL (ref 3.4–10.8)

## 2021-02-09 LAB — BASIC METABOLIC PANEL
BUN/Creatinine Ratio: 20 (ref 10–24)
BUN: 25 mg/dL (ref 8–27)
CO2: 23 mmol/L (ref 20–29)
Calcium: 9.9 mg/dL (ref 8.6–10.2)
Chloride: 98 mmol/L (ref 96–106)
Creatinine, Ser: 1.24 mg/dL (ref 0.76–1.27)
Glucose: 112 mg/dL — ABNORMAL HIGH (ref 70–99)
Potassium: 4.4 mmol/L (ref 3.5–5.2)
Sodium: 136 mmol/L (ref 134–144)
eGFR: 61 mL/min/{1.73_m2} (ref >59)

## 2021-02-09 MED ORDER — FUROSEMIDE 40 MG PO TABS: 40.0000 mg | ORAL_TABLET | Freq: Every day | ORAL | 1 refills | Status: DC

## 2021-02-09 NOTE — Patient Instructions (Signed)
Mediterranean Diet °A Mediterranean diet refers to food and lifestyle choices that are based on the traditions of countries located on the [place]. It focuses on eating more fruits, vegetables, whole grains, beans, nuts, seeds, and heart-healthy fats, and eating less dairy, meat, eggs, and processed foods with added sugar, salt, and fat. This way of eating has been shown to help prevent certain conditions and improve outcomes for people who have chronic diseases, like kidney disease and heart disease. °What are tips for following this plan? °Reading food labels °Check the serving size of packaged foods. For foods such as rice and pasta, the serving size refers to the amount of cooked product, not dry. °Check the total fat in packaged foods. Avoid foods that have saturated fat or trans fats. °Check the ingredient list for added sugars, such as corn syrup. °Shopping ° °Buy a variety of foods that offer a balanced diet, including: °Fresh fruits and vegetables (produce). °Grains, beans, nuts, and seeds. Some of these may be available in unpackaged forms or large amounts (in bulk). °Fresh seafood. °Poultry and eggs. °Low-fat dairy products. °Buy whole ingredients instead of prepackaged foods. °Buy fresh fruits and vegetables in-season from local farmers markets. °Buy plain frozen fruits and vegetables. °If you do not have access to quality fresh seafood, buy precooked frozen shrimp or canned fish, such as tuna, salmon, or sardines. °Stock your pantry so you always have certain foods on hand, such as olive oil, canned tuna, canned tomatoes, rice, pasta, and beans. °Cooking °Cook foods with extra-virgin olive oil instead of using butter or other vegetable oils. °Have meat as a side dish, and have vegetables or grains as your main dish. This means having meat in small portions or adding small amounts of meat to foods like pasta or stew. °Use beans or vegetables instead of meat in common dishes like chili or  lasagna. °Experiment with different cooking methods. Try roasting, broiling, steaming, and sautéing vegetables. °Add frozen vegetables to soups, stews, pasta, or rice. °Add nuts or seeds for added healthy fats and plant protein at each meal. You can add these to yogurt, salads, or vegetable dishes. °Marinate fish or vegetables using olive oil, lemon juice, garlic, and fresh herbs. °Meal planning °Plan to eat one vegetarian meal one day each week. Try to work up to two vegetarian meals, if possible. °Eat seafood two or more times a week. °Have healthy snacks readily available, such as: °Vegetable sticks with hummus. °Greek yogurt. °Fruit and nut trail mix. °Eat balanced meals throughout the week. This includes: °Fruit: 2-3 servings a day. °Vegetables: 4-5 servings a day. °Low-fat dairy: 2 servings a day. °Fish, poultry, or lean meat: 1 serving a day. °Beans and legumes: 2 or more servings a week. °Nuts and seeds: 1-2 servings a day. °Whole grains: 6-8 servings a day. °Extra-virgin olive oil: 3-4 servings a day. °Limit red meat and sweets to only a few servings a month. °Lifestyle ° °Cook and eat meals together with your family, when possible. °Drink enough fluid to keep your urine pale yellow. °Be physically active every day. This includes: °Aerobic exercise like running or swimming. °Leisure activities like gardening, walking, or housework. °Get 7-8 hours of sleep each night. °If recommended by your health care provider, drink red wine in moderation. This means 1 glass a day for nonpregnant women and 2 glasses a day for men. A glass of wine equals 5 oz (150 mL). °What foods should I eat? °Fruits °Apples. Apricots. Avocado. Berries. Bananas. Cherries. Dates.   Figs. Grapes. Lemons. Melon. Oranges. Peaches. Plums. Pomegranate. °Vegetables °Artichokes. Beets. Broccoli. Cabbage. Carrots. Eggplant. Green beans. Chard. Kale. Spinach. Onions. Leeks. Peas. Squash. Tomatoes. Peppers. Radishes. °Grains °Whole-grain pasta. Brown  rice. Bulgur wheat. Polenta. Couscous. Whole-wheat bread. Oatmeal. Quinoa. °Meats and other proteins °Beans. Almonds. Sunflower seeds. Pine nuts. Peanuts. Cod. Salmon. Scallops. Shrimp. Tuna. Tilapia. Clams. Oysters. Eggs. Poultry without skin. °Dairy °Low-fat milk. Cheese. Greek yogurt. °Fats and oils °Extra-virgin olive oil. Avocado oil. Grapeseed oil. °Beverages °Water. Red wine. Herbal tea. °Sweets and desserts °Greek yogurt with honey. Baked apples. Poached pears. Trail mix. °Seasonings and condiments °Basil. Cilantro. Coriander. Cumin. Mint. Parsley. Sage. Rosemary. Tarragon. Garlic. Oregano. Thyme. Pepper. Balsamic vinegar. Tahini. Hummus. Tomato sauce. Olives. Mushrooms. °The items listed above may not be a complete list of foods and beverages you can eat. Contact a dietitian for more information. °What foods should I limit? °This is a list of foods that should be eaten rarely or only on special occasions. °Fruits °Fruit canned in syrup. °Vegetables °Deep-fried potatoes (french fries). °Grains °Prepackaged pasta or rice dishes. Prepackaged cereal with added sugar. Prepackaged snacks with added sugar. °Meats and other proteins °Beef. Pork. Lamb. Poultry with skin. Hot dogs. Bacon. °Dairy °Ice cream. Sour cream. Whole milk. °Fats and oils °Butter. Canola oil. Vegetable oil. Beef fat (tallow). Lard. °Beverages °Juice. Sugar-sweetened soft drinks. Beer. Liquor and spirits. °Sweets and desserts °Cookies. Cakes. Pies. Candy. °Seasonings and condiments °Mayonnaise. Pre-made sauces and marinades. °The items listed above may not be a complete list of foods and beverages you should limit. Contact a dietitian for more information. °Summary °The Mediterranean diet includes both food and lifestyle choices. °Eat a variety of fresh fruits and vegetables, beans, nuts, seeds, and whole grains. °Limit the amount of red meat and sweets that you eat. °If recommended by your health care provider, drink red wine in moderation.  This means 1 glass a day for nonpregnant women and 2 glasses a day for men. A glass of wine equals 5 oz (150 mL). °This information is not intended to replace advice given to you by your health care provider. Make sure you discuss any questions you have with your health care provider. °Document Revised: 04/05/2019 Document Reviewed: 01/31/2019 °Elsevier Patient Education © 2022 [employer] ° °

## 2021-02-09 NOTE — Progress Notes (Signed)
Labs are stable for coronary angiography.  Elevated hemoglobin could be related to underlying COPD.

## 2021-02-11 NOTE — Anesthesia Preprocedure Evaluation (Addendum)
Anesthesia Evaluation  Patient identified by MRN, date of birth, ID band Patient awake    Reviewed: Allergy & Precautions, NPO status , Patient's Chart, lab work & pertinent test results  Airway Mallampati: II  TM Distance: >3 FB Neck ROM: Full    Dental  (+) Dental Advisory Given, Upper Dentures   Pulmonary COPD, former smoker,    Pulmonary exam normal breath sounds clear to auscultation       Cardiovascular hypertension, Pt. on medications + Valvular Problems/Murmurs MR  Rhythm:Regular Rate:Normal + Systolic murmurs    Neuro/Psych negative neurological ROS     GI/Hepatic Neg liver ROS, GERD  ,  Endo/Other  negative endocrine ROSObesity   Renal/GU negative Renal ROS     Musculoskeletal  (+) Arthritis ,   Abdominal   Peds  Hematology negative hematology ROS (+)   Anesthesia Other Findings   Reproductive/Obstetrics                            Anesthesia Physical Anesthesia Plan  ASA: 3  Anesthesia Plan: MAC   Post-op Pain Management:    Induction: Intravenous  PONV Risk Score and Plan: 1 and Propofol infusion and Treatment may vary due to age or medical condition  Airway Management Planned: Natural Airway and Nasal Cannula  Additional Equipment:   Intra-op Plan:   Post-operative Plan:   Informed Consent: I have reviewed the patients History and Physical, chart, labs and discussed the procedure including the risks, benefits and alternatives for the proposed anesthesia with the patient or authorized representative who has indicated his/her understanding and acceptance.     Dental advisory given  Plan Discussed with: CRNA  Anesthesia Plan Comments:        Anesthesia Quick Evaluation

## 2021-02-12 ENCOUNTER — Ambulatory Visit (HOSPITAL_COMMUNITY): Payer: Medicare Other

## 2021-02-12 ENCOUNTER — Ambulatory Visit (HOSPITAL_COMMUNITY): Payer: Medicare Other | Admitting: Anesthesiology

## 2021-02-12 ENCOUNTER — Other Ambulatory Visit: Payer: Self-pay

## 2021-02-12 ENCOUNTER — Encounter (HOSPITAL_COMMUNITY): Payer: Self-pay | Admitting: Cardiology

## 2021-02-12 ENCOUNTER — Ambulatory Visit: Payer: Medicare Other

## 2021-02-12 ENCOUNTER — Ambulatory Visit (HOSPITAL_COMMUNITY)
Admission: RE | Admit: 2021-02-12 | Discharge: 2021-02-12 | Disposition: A | Discharge status: Home / Self Care | Payer: Medicare Other | Attending: Cardiology | Admitting: Cardiology

## 2021-02-12 ENCOUNTER — Encounter (HOSPITAL_COMMUNITY)
Admission: RE | Disposition: A | Discharge status: Home / Self Care | Payer: Self-pay | Source: Home / Self Care | Attending: Cardiology

## 2021-02-12 DIAGNOSIS — I700 Atherosclerosis of aorta: Secondary | ICD-10-CM | POA: Diagnosis not present

## 2021-02-12 DIAGNOSIS — I110 Hypertensive heart disease with heart failure: Secondary | ICD-10-CM | POA: Diagnosis not present

## 2021-02-12 DIAGNOSIS — Z6831 Body mass index (BMI) 31.0-31.9, adult: Secondary | ICD-10-CM | POA: Diagnosis not present

## 2021-02-12 DIAGNOSIS — Z87891 Personal history of nicotine dependence: Secondary | ICD-10-CM | POA: Diagnosis not present

## 2021-02-12 DIAGNOSIS — J449 Chronic obstructive pulmonary disease, unspecified: Secondary | ICD-10-CM | POA: Diagnosis not present

## 2021-02-12 DIAGNOSIS — E669 Obesity, unspecified: Secondary | ICD-10-CM | POA: Insufficient documentation

## 2021-02-12 DIAGNOSIS — I340 Nonrheumatic mitral (valve) insufficiency: Secondary | ICD-10-CM | POA: Insufficient documentation

## 2021-02-12 DIAGNOSIS — I5031 Acute diastolic (congestive) heart failure: Secondary | ICD-10-CM | POA: Insufficient documentation

## 2021-02-12 DIAGNOSIS — I511 Rupture of chordae tendineae, not elsewhere classified: Secondary | ICD-10-CM | POA: Diagnosis present

## 2021-02-12 DIAGNOSIS — I342 Nonrheumatic mitral (valve) stenosis: Secondary | ICD-10-CM | POA: Diagnosis not present

## 2021-02-12 DIAGNOSIS — E785 Hyperlipidemia, unspecified: Secondary | ICD-10-CM | POA: Diagnosis not present

## 2021-02-12 DIAGNOSIS — I081 Rheumatic disorders of both mitral and tricuspid valves: Secondary | ICD-10-CM | POA: Diagnosis not present

## 2021-02-12 DIAGNOSIS — K219 Gastro-esophageal reflux disease without esophagitis: Secondary | ICD-10-CM | POA: Diagnosis not present

## 2021-02-12 HISTORY — PX: TEE WITHOUT CARDIOVERSION: SHX5443

## 2021-02-12 SURGERY — ECHOCARDIOGRAM, TRANSESOPHAGEAL
Anesthesia: Monitor Anesthesia Care

## 2021-02-12 MED ORDER — SODIUM CHLORIDE 0.9 % IV SOLN: INTRAVENOUS | Status: DC

## 2021-02-12 MED ORDER — LACTATED RINGERS IV SOLN: INTRAVENOUS | Status: DC | PRN

## 2021-02-12 MED ORDER — LACTATED RINGERS IV SOLN
INTRAVENOUS | Status: AC | PRN
  Administered 2021-02-12: 1000 mL via INTRAVENOUS

## 2021-02-12 MED ORDER — PROPOFOL 500 MG/50ML IV EMUL
INTRAVENOUS | Status: DC | PRN
  Administered 2021-02-12: 50 ug/kg/min via INTRAVENOUS

## 2021-02-12 NOTE — Transfer of Care (Signed)
Immediate Anesthesia Transfer of Care Note  Patient: Ryan Mathews  Procedure(s) Performed: TRANSESOPHAGEAL ECHOCARDIOGRAM (TEE)  Patient Location: PACU  Anesthesia Type:MAC  Level of Consciousness: awake, alert  and patient cooperative  Airway & Oxygen Therapy: Patient Spontanous Breathing  Post-op Assessment: Report given to RN and Post -op Vital signs reviewed and stable  Post vital signs: Reviewed and stable  Last Vitals:  Vitals Value Taken Time  BP 115/75 02/12/21 0826  Temp    Pulse 64 02/12/21 0826  Resp 15 02/12/21 0826  SpO2 96 % 02/12/21 0826  Vitals shown include unvalidated device data.  Last Pain:  Vitals:   02/12/21 0639  TempSrc: Oral  PainSc: 0-No pain         Complications: No notable events documented.

## 2021-02-12 NOTE — CV Procedure (Signed)
TEE: Under deep sedation, TEE was performed without complications: LV: Normal. Normal EF. RV: Normal LA: Enlarged Left atrial appendage: Normal without thrombus. Normal function. Inter atrial septum is intact without defect. Double contrast study negative for atrial level shunting. No late appearance of bubbles either. RA: Normal  MV: Severe prolapse of the posterior MV leaflet with flail leaflet and resultant eccentric anteriorly directed severe MR with reversal of flow in the right upper and lower and left upper pulmonary veins.  TV: Normal Trace TR AV: Normal. No AI or AS. PV: Normal. Trace PI.  Thoracic and ascending aorta: Mild atheromatous plaque throughout the arch and descending thoracic aorta.   Adrian Prows, MD, [facility] 02/12/2021, 8:21 AM Office: [phone] Fax: [phone] Pager: [phone]

## 2021-02-12 NOTE — Anesthesia Postprocedure Evaluation (Signed)
Anesthesia Post Note  Patient: [name]  Procedure(s) Performed: TRANSESOPHAGEAL ECHOCARDIOGRAM (TEE)     Patient location during evaluation: PACU Anesthesia Type: MAC Level of consciousness: awake and alert Pain management: pain level controlled Vital Signs Assessment: post-procedure vital signs reviewed and stable Respiratory status: spontaneous breathing, nonlabored ventilation, respiratory function stable and patient connected to nasal cannula oxygen Cardiovascular status: stable and blood pressure returned to baseline Postop Assessment: no apparent nausea or vomiting Anesthetic complications: no   No notable events documented.  Last Vitals:  Vitals:   02/12/21 0841 02/12/21 0856  BP: (!) 121/54 124/80  Pulse: 62 62  Resp: 16 20  Temp:  36.4 C  SpO2: 97% 97%    Last Pain:  Vitals:   02/12/21 0856  TempSrc:   PainSc: 0-No pain                 Catalina Gravel

## 2021-02-12 NOTE — Interval H&P Note (Signed)
History and Physical Interval Note:  02/12/2021 7:50 AM  Ryan Mathews  has presented today for surgery, with the diagnosis of ACUTE MITRAL REGURGITATION.  The various methods of treatment have been discussed with the patient and family. After consideration of risks, benefits and other options for treatment, the patient has consented to  Procedure(s): TRANSESOPHAGEAL ECHOCARDIOGRAM (TEE) (N/A) as a surgical intervention.  The patient's history has been reviewed, patient examined, no change in status, stable for surgery.  I have reviewed the patient's chart and labs.  Questions were answered to the patient's satisfaction.     Adrian Prows

## 2021-02-14 ENCOUNTER — Encounter (HOSPITAL_COMMUNITY): Payer: Self-pay | Admitting: Cardiology

## 2021-02-16 ENCOUNTER — Telehealth: Payer: Self-pay | Admitting: Cardiology

## 2021-02-16 ENCOUNTER — Encounter (HOSPITAL_COMMUNITY)
Admission: RE | Disposition: A | Discharge status: Home / Self Care | Payer: Self-pay | Source: Home / Self Care | Attending: Cardiology

## 2021-02-16 ENCOUNTER — Other Ambulatory Visit: Payer: Self-pay | Admitting: Family Medicine

## 2021-02-16 ENCOUNTER — Other Ambulatory Visit: Payer: Self-pay

## 2021-02-16 ENCOUNTER — Ambulatory Visit (HOSPITAL_COMMUNITY)
Admission: RE | Admit: 2021-02-16 | Discharge: 2021-02-16 | Disposition: A | Discharge status: Home / Self Care | Payer: Medicare Other | Attending: Cardiology | Admitting: Cardiology

## 2021-02-16 DIAGNOSIS — Z6831 Body mass index (BMI) 31.0-31.9, adult: Secondary | ICD-10-CM | POA: Diagnosis not present

## 2021-02-16 DIAGNOSIS — Z87891 Personal history of nicotine dependence: Secondary | ICD-10-CM | POA: Insufficient documentation

## 2021-02-16 DIAGNOSIS — Z79899 Other long term (current) drug therapy: Secondary | ICD-10-CM | POA: Insufficient documentation

## 2021-02-16 DIAGNOSIS — E669 Obesity, unspecified: Secondary | ICD-10-CM | POA: Insufficient documentation

## 2021-02-16 DIAGNOSIS — I110 Hypertensive heart disease with heart failure: Secondary | ICD-10-CM | POA: Insufficient documentation

## 2021-02-16 DIAGNOSIS — I340 Nonrheumatic mitral (valve) insufficiency: Secondary | ICD-10-CM | POA: Diagnosis not present

## 2021-02-16 DIAGNOSIS — I700 Atherosclerosis of aorta: Secondary | ICD-10-CM | POA: Diagnosis not present

## 2021-02-16 DIAGNOSIS — I5031 Acute diastolic (congestive) heart failure: Secondary | ICD-10-CM | POA: Diagnosis not present

## 2021-02-16 DIAGNOSIS — I349 Nonrheumatic mitral valve disorder, unspecified: Secondary | ICD-10-CM | POA: Diagnosis not present

## 2021-02-16 DIAGNOSIS — R079 Chest pain, unspecified: Secondary | ICD-10-CM | POA: Insufficient documentation

## 2021-02-16 HISTORY — PX: RIGHT/LEFT HEART CATH AND CORONARY ANGIOGRAPHY: CATH118266

## 2021-02-16 LAB — POCT I-STAT EG7
Acid-base deficit: 3 mmol/L — ABNORMAL HIGH (ref 0.0–2.0)
Acid-base deficit: 4 mmol/L — ABNORMAL HIGH (ref 0.0–2.0)
Acid-base deficit: 4 mmol/L — ABNORMAL HIGH (ref 0.0–2.0)
Bicarbonate: 21.8 mmol/L (ref 20.0–28.0)
Bicarbonate: 22.4 mmol/L (ref 20.0–28.0)
Bicarbonate: 22.7 mmol/L (ref 20.0–28.0)
Calcium, Ion: 1.2 mmol/L (ref 1.15–1.40)
Calcium, Ion: 1.21 mmol/L (ref 1.15–1.40)
Calcium, Ion: 1.25 mmol/L (ref 1.15–1.40)
HCT: 49 % (ref 39.0–52.0)
HCT: 49 % (ref 39.0–52.0)
HCT: 49 % (ref 39.0–52.0)
Hemoglobin: 16.7 g/dL (ref 13.0–17.0)
Hemoglobin: 16.7 g/dL (ref 13.0–17.0)
Hemoglobin: 16.7 g/dL (ref 13.0–17.0)
O2 Saturation: 69 %
O2 Saturation: 70 %
O2 Saturation: 70 %
Potassium: 4.3 mmol/L (ref 3.5–5.1)
Potassium: 4.3 mmol/L (ref 3.5–5.1)
Potassium: 4.6 mmol/L (ref 3.5–5.1)
Sodium: 139 mmol/L (ref 135–145)
Sodium: 140 mmol/L (ref 135–145)
Sodium: 140 mmol/L (ref 135–145)
TCO2: 23 mmol/L (ref 22–32)
TCO2: 24 mmol/L (ref 22–32)
TCO2: 24 mmol/L (ref 22–32)
pCO2, Ven: 40.6 mmHg — ABNORMAL LOW (ref 44.0–60.0)
pCO2, Ven: 40.8 mmHg — ABNORMAL LOW (ref 44.0–60.0)
pCO2, Ven: 43.2 mmHg — ABNORMAL LOW (ref 44.0–60.0)
pH, Ven: 7.322 (ref 7.250–7.430)
pH, Ven: 7.338 (ref 7.250–7.430)
pH, Ven: 7.353 (ref 7.250–7.430)
pO2, Ven: 37 mmHg (ref 32.0–45.0)
pO2, Ven: 39 mmHg (ref 32.0–45.0)
pO2, Ven: 40 mmHg (ref 32.0–45.0)

## 2021-02-16 LAB — POCT I-STAT 7, (LYTES, BLD GAS, ICA,H+H)
Acid-base deficit: 4 mmol/L — ABNORMAL HIGH (ref 0.0–2.0)
Bicarbonate: 21.9 mmol/L (ref 20.0–28.0)
Calcium, Ion: 1.26 mmol/L (ref 1.15–1.40)
HCT: 48 % (ref 39.0–52.0)
Hemoglobin: 16.3 g/dL (ref 13.0–17.0)
O2 Saturation: 100 %
Potassium: 4.6 mmol/L (ref 3.5–5.1)
Sodium: 138 mmol/L (ref 135–145)
TCO2: 23 mmol/L (ref 22–32)
pCO2 arterial: 41.5 mmHg (ref 32.0–48.0)
pH, Arterial: 7.33 — ABNORMAL LOW (ref 7.350–7.450)
pO2, Arterial: 183 mmHg — ABNORMAL HIGH (ref 83.0–108.0)

## 2021-02-16 SURGERY — RIGHT/LEFT HEART CATH AND CORONARY ANGIOGRAPHY
Anesthesia: LOCAL

## 2021-02-16 MED ORDER — VERAPAMIL HCL 2.5 MG/ML IV SOLN
INTRAVENOUS | Status: DC | PRN
  Administered 2021-02-16: 10 mL via INTRA_ARTERIAL

## 2021-02-16 MED ORDER — SODIUM CHLORIDE 0.9% FLUSH: 3.0000 mL | Freq: Two times a day (BID) | INTRAVENOUS | Status: DC

## 2021-02-16 MED ORDER — VERAPAMIL HCL 2.5 MG/ML IV SOLN
INTRAVENOUS | Status: AC
  Filled 2021-02-16: qty 2

## 2021-02-16 MED ORDER — ONDANSETRON HCL 4 MG/2ML IJ SOLN: 4.0000 mg | Freq: Four times a day (QID) | INTRAMUSCULAR | Status: DC | PRN

## 2021-02-16 MED ORDER — MIDAZOLAM HCL 2 MG/2ML IJ SOLN
INTRAMUSCULAR | Status: AC
  Filled 2021-02-16: qty 2

## 2021-02-16 MED ORDER — FENTANYL CITRATE (PF) 100 MCG/2ML IJ SOLN
INTRAMUSCULAR | Status: AC
  Filled 2021-02-16: qty 2

## 2021-02-16 MED ORDER — SODIUM CHLORIDE 0.9 % IV SOLN: 250.0000 mL | INTRAVENOUS | Status: DC | PRN

## 2021-02-16 MED ORDER — SODIUM CHLORIDE 0.9% FLUSH: 3.0000 mL | INTRAVENOUS | Status: DC | PRN

## 2021-02-16 MED ORDER — ASPIRIN 81 MG PO CHEW: 81.0000 mg | CHEWABLE_TABLET | ORAL | Status: DC

## 2021-02-16 MED ORDER — HEPARIN (PORCINE) IN NACL 1000-0.9 UT/500ML-% IV SOLN
INTRAVENOUS | Status: AC
  Filled 2021-02-16: qty 1000

## 2021-02-16 MED ORDER — ASPIRIN 81 MG PO CHEW
CHEWABLE_TABLET | ORAL | Status: AC
  Filled 2021-02-16: qty 1

## 2021-02-16 MED ORDER — IOHEXOL 350 MG/ML SOLN
INTRAVENOUS | Status: DC | PRN
  Administered 2021-02-16: 50 mL

## 2021-02-16 MED ORDER — ACETAMINOPHEN 325 MG PO TABS: 650.0000 mg | ORAL_TABLET | ORAL | Status: DC | PRN

## 2021-02-16 MED ORDER — LIDOCAINE HCL (PF) 1 % IJ SOLN
INTRAMUSCULAR | Status: AC
  Filled 2021-02-16: qty 30

## 2021-02-16 MED ORDER — SODIUM CHLORIDE 0.9 % IV SOLN: INTRAVENOUS | Status: DC

## 2021-02-16 MED ORDER — ASPIRIN 81 MG PO CHEW
81.0000 mg | CHEWABLE_TABLET | ORAL | Status: AC
  Administered 2021-02-16: 81 mg via ORAL

## 2021-02-16 MED ORDER — FENTANYL CITRATE (PF) 100 MCG/2ML IJ SOLN
INTRAMUSCULAR | Status: DC | PRN
  Administered 2021-02-16: 25 ug via INTRAVENOUS

## 2021-02-16 MED ORDER — HEPARIN (PORCINE) IN NACL 1000-0.9 UT/500ML-% IV SOLN
INTRAVENOUS | Status: DC | PRN
  Administered 2021-02-16 (×2): 500 mL

## 2021-02-16 MED ORDER — HEPARIN SODIUM (PORCINE) 1000 UNIT/ML IJ SOLN
INTRAMUSCULAR | Status: DC | PRN
  Administered 2021-02-16: 4000 [IU] via INTRAVENOUS

## 2021-02-16 MED ORDER — HEPARIN SODIUM (PORCINE) 1000 UNIT/ML IJ SOLN
INTRAMUSCULAR | Status: AC
  Filled 2021-02-16: qty 10

## 2021-02-16 MED ORDER — MIDAZOLAM HCL 2 MG/2ML IJ SOLN
INTRAMUSCULAR | Status: DC | PRN
  Administered 2021-02-16: 1 mg via INTRAVENOUS

## 2021-02-16 MED ORDER — LIDOCAINE HCL (PF) 1 % IJ SOLN
INTRAMUSCULAR | Status: DC | PRN
  Administered 2021-02-16 (×2): 2 mL via INTRADERMAL

## 2021-02-16 MED ORDER — SODIUM CHLORIDE 0.9 % WEIGHT BASED INFUSION: 1.0000 mL/kg/h | INTRAVENOUS | Status: DC

## 2021-02-16 SURGICAL SUPPLY — 11 items
CATH BALLN WEDGE 5F 110CM (CATHETERS) ×2 IMPLANT
CATH OPTITORQUE TIG 4.0 5F (CATHETERS) ×2 IMPLANT
DEVICE RAD COMP TR BAND LRG (VASCULAR PRODUCTS) ×2 IMPLANT
GLIDESHEATH SLEND A-KIT 6F 22G (SHEATH) ×2 IMPLANT
GUIDEWIRE INQWIRE 1.5J.035X260 (WIRE) ×1 IMPLANT
INQWIRE 1.5J .035X260CM (WIRE) ×2
KIT HEART LEFT (KITS) ×2 IMPLANT
PACK CARDIAC CATHETERIZATION (CUSTOM PROCEDURE TRAY) ×2 IMPLANT
SHEATH GLIDE SLENDER 4/5FR (SHEATH) ×2 IMPLANT
TRANSDUCER W/STOPCOCK (MISCELLANEOUS) ×2 IMPLANT
TUBING CIL FLEX 10 FLL-RA (TUBING) ×2 IMPLANT

## 2021-02-16 NOTE — Interval H&P Note (Signed)
History and Physical Interval Note:  02/16/2021 11:14 AM  Ryan Mathews  has presented today for surgery, with the diagnosis of chest pain - hf.  The various methods of treatment have been discussed with the patient and family. After consideration of risks, benefits and other options for treatment, the patient has consented to  Procedure(s): RIGHT/LEFT HEART CATH AND CORONARY ANGIOGRAPHY (N/A) as a surgical intervention.  The patient's history has been reviewed, patient examined, no change in status, stable for surgery.  I have reviewed the patient's chart and labs.  Questions were answered to the patient's satisfaction.     Adrian Prows

## 2021-02-16 NOTE — Telephone Encounter (Signed)
Discussed with patient's wife regarding the TEE findings and also cardiac catheterization.  Patient will be scheduled for mitral valve repair.    ICD-10-CM   1. Severe mitral regurgitation  I34.0 Ambulatory referral to Cardiothoracic Surgery    2. Acute diastolic heart failure ([place])  I50.31 Ambulatory referral to Cardiothoracic Surgery     Orders Placed This Encounter  Procedures   Ambulatory referral to Cardiothoracic Surgery    Referral Priority:   Urgent    Referral Type:   Surgical    Referral Reason:   Specialty Services Required    Referred to Provider:   Phylis Bougie, MD    Requested Specialty:   Cardiothoracic Surgery    Number of Visits Requested:   1     Adrian Prows, MD, [facility] 02/16/2021, 12:16 PM Office: [phone] Fax: [phone] Pager: [phone]

## 2021-02-17 ENCOUNTER — Encounter (HOSPITAL_COMMUNITY): Payer: Self-pay | Admitting: Cardiology

## 2021-02-19 ENCOUNTER — Encounter: Payer: Self-pay | Admitting: Cardiology

## 2021-02-19 ENCOUNTER — Telehealth: Payer: Self-pay | Admitting: Student

## 2021-02-19 NOTE — Telephone Encounter (Signed)
Patient had questions regarding who would call to set up cardiothoracic surgery consultation, advised him to look for a call from the surgeon's office to set up consultation.  He will notify us if he does not hear from CT surgery office in the next 1 week.

## 2021-02-19 NOTE — Telephone Encounter (Signed)
Patient is asking you (CC) to call him when you can. He says Lavone Nian spoke w/his wife about referring him to either [facility] or [facility] for surgery, and he would like more information about this. Phone number [phone].

## 2021-02-22 ENCOUNTER — Other Ambulatory Visit: Payer: Self-pay | Admitting: Family Medicine

## 2021-02-22 NOTE — Telephone Encounter (Signed)
I think this was stopped by cardiology when they started spironolactone. Plz check to see if pt still taking potassium.  Also it seems he's being referred to [facility] cardiothoracic surgery for valve surgery - how is he feeling/breathing?

## 2021-02-24 ENCOUNTER — Other Ambulatory Visit: Payer: Self-pay | Admitting: Cardiology

## 2021-02-24 DIAGNOSIS — I5031 Acute diastolic (congestive) heart failure: Secondary | ICD-10-CM

## 2021-02-24 NOTE — Telephone Encounter (Signed)
Noted! Thank you

## 2021-03-01 ENCOUNTER — Encounter: Payer: Self-pay | Admitting: Student

## 2021-03-01 ENCOUNTER — Ambulatory Visit: Payer: Medicare Other | Admitting: Student

## 2021-03-01 ENCOUNTER — Other Ambulatory Visit: Payer: Self-pay

## 2021-03-01 ENCOUNTER — Other Ambulatory Visit: Payer: Self-pay | Admitting: Student

## 2021-03-01 VITALS — BP 117/70 | HR 61 | Temp 97.9°F | Ht 68.0 in | Wt 205.0 lb

## 2021-03-01 DIAGNOSIS — I10 Essential (primary) hypertension: Secondary | ICD-10-CM

## 2021-03-01 DIAGNOSIS — I340 Nonrheumatic mitral (valve) insufficiency: Secondary | ICD-10-CM | POA: Diagnosis not present

## 2021-03-01 DIAGNOSIS — R0989 Other specified symptoms and signs involving the circulatory and respiratory systems: Secondary | ICD-10-CM

## 2021-03-01 DIAGNOSIS — I5031 Acute diastolic (congestive) heart failure: Secondary | ICD-10-CM

## 2021-03-01 DIAGNOSIS — J449 Chronic obstructive pulmonary disease, unspecified: Secondary | ICD-10-CM

## 2021-03-01 NOTE — Progress Notes (Signed)
Primary Physician/Referring:  Ria Bush, MD  Patient ID: Ryan Mathews, male    DOB: Dec 30, 1945, 75 y.o.   MRN: [id number]  Chief Complaint  Patient presents with   Severe mitral regurgitation   Follow-up   Hospitalization Follow-up   HPI:    Ryan Mathews  is a 75 y.o. Caucasian male with history of smoking and COPD, hypertension.  Patient presented to PCPs office 01/25/2021 with exertional dyspnea, PCP subsequently ordered echocardiogram which revealed hyperdynamic LV with LVEF of 27-25%, grade 2 diastolic dysfunction and severe mitral valve regurgitation with flail middle scallop of the posterior mitral leaflet concerning for chordal rupture. Patient was therefore referred to our office for urgent evaluation and management.   Patient establish care with our office with Dr. Einar Gip 02/01/2021.  Dr. Einar Gip had personally reviewed echocardiogram and felt he is in acute decompensated diastolic heart failure with severe MR, flail mitral leaflet cannot be excluded.  Patient subsequently underwent TEE and left/right heart catheterization, details below.  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 Einar Gip sent referral to [facility] cardiothoracic surgery for consideration of mitral valve repair/replacement.   Patient continues to complain of dyspnea on exertion which remained stable and fatigue.  Bilateral leg edema has essentially resolved.  He is scheduled to see pulmonology later this month and tentatively scheduled to see cardiothoracic surgery in January.  Past Medical History:  Diagnosis Date   Bilateral inguinal hernia (BIH) s/p lap repair 09/06/2012 08/09/2012   Cataract    CMC arthritis, thumb, degenerative    right   COPD (chronic obstructive pulmonary disease) (HCC)    Emphysema of lung (HCC)    Ex-smoker    GERD (gastroesophageal reflux disease)    History of chicken pox    History of pneumonia 08/06/2009        Nasal septal deviation 04/14/2017    Marked   Numbness    R cheek stays numb   Obesity (BMI 30-39.9) 08/09/2012   Past Surgical History:  Procedure Laterality Date   ANKLE SURGERY Left 1966   with tendon repair with stainless steel wire   COLONOSCOPY  07/2017   multiple tubular adenomas, rpt 3 yrs ([place])   INSERTION OF MESH Bilateral 09/06/2012   Procedure: INSERTION OF MESH;  Surgeon: Adin Hector, MD   LAPAROSCOPIC INGUINAL HERNIA WITH UMBILICAL HERNIA Bilateral 09/06/2012   Procedure: LAPAROSCOPIC exploration and repair of hernias in bellybutton and bilateral groins with mesh;  Surgeon: Adin Hector, MD   MICRODISCECTOMY LUMBAR Right 08/2020   L4/5 (Dr Zada Finders)   RIGHT/LEFT HEART CATH AND CORONARY ANGIOGRAPHY N/A 02/16/2021   Procedure: RIGHT/LEFT HEART CATH AND CORONARY ANGIOGRAPHY;  Surgeon: Adrian Prows, MD;  Location: [place] [facility];  Service: Cardiovascular;  Laterality: N/A;   TEE WITHOUT CARDIOVERSION N/A 02/12/2021   Procedure: TRANSESOPHAGEAL ECHOCARDIOGRAM (TEE);  Surgeon: Adrian Prows, MD;  Location: [facility] ENDOSCOPY;  Service: Cardiovascular;  Laterality: N/A;   Family History  Problem Relation Age of Onset   Cancer Mother        breast   Congenital heart disease Mother    Diabetes Mother    COPD Father    Cancer Sister        lung (minimal smoker)   Colon cancer Neg Hx    Esophageal cancer Neg Hx    Liver cancer Neg Hx    Rectal cancer Neg Hx    Stomach cancer Neg Hx    Pancreatic cancer Neg Hx  Social History   Tobacco Use   Smoking status: Former    Packs/day: 2.00    Years: 40.00    Pack years: 80.00    Types: Cigarettes    Start date: 03/14/1968    Quit date: 03/14/2006    Years since quitting: 14.9   Smokeless tobacco: Never  Substance Use Topics   Alcohol use: Yes    Comment: Occasionally   Marital Status: Married   ROS  Review of Systems  Cardiovascular:  Positive for dyspnea on exertion (stable) and paroxysmal nocturnal dyspnea. Negative for chest pain, leg swelling and  orthopnea.  Musculoskeletal:  Positive for back pain.  Gastrointestinal:  Negative for melena.   Objective  Blood pressure 117/70, pulse 61, temperature 97.9 F (36.6 C), temperature source Temporal, height 5\' 8"  (1.727 m), weight 205 lb (93 kg), SpO2 97 %.  Vitals with BMI 03/01/2021 02/16/2021 02/16/2021  Height 5\' 8"  - -  Weight 205 lbs - -  BMI 02.58 - -  Systolic 527 782 423  Diastolic 70 76 66  Pulse 61 67 66     Physical Exam Vitals reviewed.  Constitutional:      Appearance: He is obese.  Neck:     Vascular: Carotid bruit (right) present. No JVD.  Cardiovascular:     Rate and Rhythm: Normal rate and regular rhythm.     Pulses: Intact distal pulses.     Heart sounds: Murmur heard.  High-pitched blowing holosystolic murmur is present with a grade of 3/6 at the apex.    No gallop.     Comments: Radial access site well-healed with mild surrounding ecchymosis.  No evidence of hematoma or bruit. Pulmonary:     Effort: Pulmonary effort is normal.     Breath sounds: No rales.  Abdominal:     General: Bowel sounds are normal.     Palpations: Abdomen is soft.  Musculoskeletal:     Right lower leg: Edema (trace) present.     Left lower leg: Edema (trace) present.   Laboratory examination:   Recent Labs    05/06/20 0733 01/25/21 1300 02/08/21 0908 02/16/21 1133 02/16/21 1142 02/16/21 1147  NA 138 143 136 140   140 139 138  K 4.4 3.3* 4.4 4.3   4.3 4.6 4.6  CL 102 106 98  --   --   --   CO2 29 26 23   --   --   --   GLUCOSE 141* 101* 112*  --   --   --   BUN 22 20 25   --   --   --   CREATININE 1.15 0.91 1.24  --   --   --   CALCIUM 9.2 9.2 9.9  --   --   --    CrCl cannot be calculated (Patient's most recent lab result is older than the maximum 21 days allowed.).  CMP Latest Ref Rng & Units 02/16/2021 02/16/2021 02/16/2021  Glucose 70 - 99 mg/dL - - -  BUN 8 - 27 mg/dL - - -  Creatinine 0.76 - 1.27 mg/dL - - -  Sodium 135 - 145 mmol/L 138 139 140  Potassium 3.5 -  5.1 mmol/L 4.6 4.6 4.3  Chloride 96 - 106 mmol/L - - -  CO2 20 - 29 mmol/L - - -  Calcium 8.6 - 10.2 mg/dL - - -  Total Protein 6.0 - 8.3 g/dL - - -  Total Bilirubin 0.2 - 1.2 mg/dL - - -  Alkaline  Phos 39 - 117 U/L - - -  AST 0 - 37 U/L - - -  ALT 0 - 53 U/L - - -   CBC Latest Ref Rng & Units 02/16/2021 02/16/2021 02/16/2021  WBC 3.4 - 10.8 x10E3/uL - - -  Hemoglobin 13.0 - 17.0 g/dL 16.3 16.7 16.7  Hematocrit 39.0 - 52.0 % 48.0 49.0 49.0  Platelets 150 - 450 x10E3/uL - - -    Lipid Panel Recent Labs    05/06/20 0733  CHOL 137  TRIG 124.0  LDLCALC 77  VLDL 24.8  HDL 35.10*  CHOLHDL 4    HEMOGLOBIN A1C No results found for: HGBA1C, MPG TSH Recent Labs    01/25/21 1300  TSH 3.91    External labs:  None   Allergies  No Known Allergies   Medications Prior to Visit:   Outpatient Medications Prior to Visit  Medication Sig Dispense Refill   atorvastatin (LIPITOR) 20 MG tablet Take 1 tablet (20 mg total) by mouth daily. 30 tablet 2   b complex vitamins tablet Take 1 tablet by mouth daily.     furosemide (LASIX) 40 MG tablet TAKE 1 TABLET (40 MG TOTAL) BY MOUTH 2 (TWO) TIMES DAILY (IN THE MORNING AND AT 3PM) (Patient taking differently: Take 40 mg by mouth daily.) 60 tablet 1   losartan (COZAAR) 50 MG tablet Take 1 tablet (50 mg total) by mouth daily. 30 tablet 2   Omega-3 Fatty Acids (FISH OIL) 1000 MG CAPS Take 1 capsule (1,000 mg total) by mouth daily.     Omeprazole-Sodium Bicarbonate (ZEGERID OTC) 20-1100 MG CAPS capsule Take 1 capsule by mouth daily before breakfast. (Patient taking differently: Take 1 capsule by mouth daily as needed (Heartburn).) 28 each 0   spironolactone (ALDACTONE) 25 MG tablet Take 1 tablet (25 mg total) by mouth daily. 30 tablet 2   vitamin C (ASCORBIC ACID) 500 MG tablet Take 500 mg by mouth daily.     No facility-administered medications prior to visit.   Final Medications at End of Visit    Current Meds  Medication Sig    atorvastatin (LIPITOR) 20 MG tablet Take 1 tablet (20 mg total) by mouth daily.   b complex vitamins tablet Take 1 tablet by mouth daily.   furosemide (LASIX) 40 MG tablet TAKE 1 TABLET (40 MG TOTAL) BY MOUTH 2 (TWO) TIMES DAILY (IN THE MORNING AND AT 3PM) (Patient taking differently: Take 40 mg by mouth daily.)   losartan (COZAAR) 50 MG tablet Take 1 tablet (50 mg total) by mouth daily.   Omega-3 Fatty Acids (FISH OIL) 1000 MG CAPS Take 1 capsule (1,000 mg total) by mouth daily.   Omeprazole-Sodium Bicarbonate (ZEGERID OTC) 20-1100 MG CAPS capsule Take 1 capsule by mouth daily before breakfast. (Patient taking differently: Take 1 capsule by mouth daily as needed (Heartburn).)   spironolactone (ALDACTONE) 25 MG tablet Take 1 tablet (25 mg total) by mouth daily.   vitamin C (ASCORBIC ACID) 500 MG tablet Take 500 mg by mouth daily.   Radiology:   No results found.  Chest x-ray 01/25/2021: 1. Dramatic change in the appearance of the lungs, which could be indicative of interstitial lung disease, although lung volumes are normal. Accordingly, acute atypical infection is not excluded. Further clinical evaluation is recommended. Consideration for follow-up high-resolution chest CT is suggested if clinically appropriate. 2. Aortic atherosclerosis  Cardiac Studies:   Right and left heart catheterization 02/16/2021: RA 5/5, mean 2 mmHg, RA saturation 70%. RV 32/5,  EDP 8 mmHg. PA 34/5, mean 18 mmHg.  PA saturation 69%. PW 7/23, mean 12 mmHg.  Giant V waves evident suggestive of severe MR.  Aortic saturation 100%. LV 122/-1, EDP 8 mmHg.  Ao 109/58, mean 75 mmHg.  No pressure gradient across the aortic valve. LM: Large vessel, mid to distal segment is mildly ectatic. LAD: Large vessel, gives origin to small diagonals, mild disease evident. LCx: Moderate caliber vessel, minimal disease. RI: Moderate caliber vessel.  Minimal ostial disease. RCA: Large-caliber vessel, dominant.  Minimal disease  present.  Impression and recommendation: Patient will need mitral valve repair.  50 mL contrast utilized.  No immediate complications.  TEE 02/12/2021:   1. Left ventricular ejection fraction, by estimation, is 60 to 65%. The left ventricle has normal function. The left ventricle has no regional wall motion abnormalities.   2. Right ventricular systolic function is normal. The right ventricular size is normal.   3. Left atrial size was moderately dilated. No left atrial/left atrial appendage thrombus was detected.   4. The mitral valve is myxomatous. Severe mitral valve regurgitation. No evidence of mitral stenosis. There is severe holosystolic prolapse of multiple scallops of the posterior leaflet of the mitral valve.   5. The aortic valve is normal in structure. Aortic valve regurgitation is not visualized. No aortic stenosis is present.   6. There is mild (Grade II) plaque involving the aortic arch and descending aorta.   7. There is reversal of flow in both upper and lower right pulmonary veins and left upper pulmonary vein.   Carotid artery duplex  02/09/2021:  The bifurcation, internal, external and common carotid arteries reveal no  evidence of significant stenosis, bilaterally.  Antegrade right vertebral artery flow. Antegrade left vertebral artery  flow.  Echocardiogram 01/26/2021:  1. Left ventricular ejection fraction, by estimation, is 70 to 75%. The left ventricle has hyperdynamic function. The left ventricle has no regional wall motion abnormalities. Left ventricular diastolic parameters are consistent with Grade II diastolic dysfunction (pseudonormalization).   2. Right ventricular systolic function is normal. The right ventricular size is normal. There is severely elevated pulmonary artery systolic pressure.   3. Left atrial size was moderately dilated.   4. Suspect flail middle scallop of the posterior mitral leaflet due to chordal rupture. The mitral valve is myxomatous.  Severe mitral valve regurgitation. No evidence of mitral stenosis.   5. Tricuspid valve regurgitation is mild to moderate.   6. The aortic valve is tricuspid. Aortic valve regurgitation is not visualized. No aortic stenosis is present.   EKG:   EKG 02/01/2021: Normal sinus rhythm at rate of 73 bpm, left axis deviation, left anterior fascicular block.  Incomplete right bundle branch block.   Assessment     ICD-10-CM   1. Severe mitral regurgitation  I34.0     2. Primary hypertension  I10     3. Bruit of right carotid artery  R09.89     4. Chronic obstructive pulmonary disease, unspecified COPD type ([place])  J44.9        There are no discontinued medications.   No orders of the defined types were placed in this encounter.    Recommendations:   Ryan Mathews is a 75 y.o. hypertension, obesity referred to me on an urgent basis for evaluation of mitral regurgitation and heart failure.  Patient establish care with our office with Dr. Einar Gip 02/01/2021.  Dr. Einar Gip had personally reviewed echocardiogram and felt he is in acute decompensated diastolic heart failure with  severe MR, flail mitral leaflet cannot be excluded.  Patient subsequently underwent TEE and left/right heart catheterization, details below.  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 Einar Gip sent referral to [facility] cardiothoracic surgery for consideration of mitral valve repair/replacement.   Patient's dyspnea and fatigue remain stable and bilateral leg edema is very minimal.  Overall he is clinically euvolemic without evidence of acute decompensated heart failure.  Advised patient to continue Lasix 40 mg once daily with additional doses as needed.  Recommend evaluation by cardiothoracic surgery for consideration of mitral valve surgery.  Patient is scheduled to be evaluated by pulmonology given suspicion for underlying pulmonary fibrosis contributing to his dyspnea.  No changes are made at today's  office visit.  There is no evidence of carotid artery stenosis.  Follow-up in 3 months, sooner if needed.   Alethia Berthold, PA-C 03/01/2021, 11:09 AM Office: [phone]

## 2021-03-01 NOTE — Progress Notes (Deleted)
Primary Physician/Referring:  Ria Bush, MD  Patient ID: Ryan Mathews, male    DOB: 07-24-1945, 75 y.o.   MRN: [id number]  No chief complaint on file.  HPI:    Ryan Mathews  is a 75 y.o. Caucasian male with history of smoking and COPD, hypertension.  Patient presented to PCPs office 01/25/2021 with exertional dyspnea, PCP subsequently ordered echocardiogram which revealed hyperdynamic LV with LVEF of 81-44%, grade 2 diastolic dysfunction and severe mitral valve regurgitation with flail middle scallop of the posterior mitral leaflet concerning for chordal rupture. Patient was therefore referred to our office for urgent evaluation and management.   Patient establish care with our office with Dr. Einar Gip 02/01/2021.  Dr. Einar Gip had personally reviewed echocardiogram and felt he is in acute decompensated diastolic heart failure with severe MR, flail mitral leaflet cannot be excluded.  At last office visit patient was advised not to exert himself, increased Lasix to 40 mg twice daily, switched from amlodipine to losartan 50 mg daily and added spironolactone 25 mg daily.  Unfortunately repeat BMP and CBC as well as BNP have not been done.  Patient is recommended for urgent TEE as well as right and left heart catheterization as he likely needs mitral valve repair.  Also given aortic atherosclerosis the last office visit started patient on atorvastatin and set him up for carotid artery duplex due to bruit on exam.  Patient has not had any further testing done from a cardiac standpoint as wanted to see him for today's visit first to stabilize him medically.  Patient's symptoms have improved since last office visit.  He continues to have dyspnea on exertion, but this is gotten better and bilateral leg edema has essentially resolved.  Patient is presently scheduled for left and right heart catheterization as well as TEE in the next 1 week.  Past Medical History:  Diagnosis Date   Bilateral  inguinal hernia (BIH) s/p lap repair 09/06/2012 08/09/2012   Cataract    CMC arthritis, thumb, degenerative    right   COPD (chronic obstructive pulmonary disease) (HCC)    Emphysema of lung (HCC)    Ex-smoker    GERD (gastroesophageal reflux disease)    History of chicken pox    History of pneumonia 08/06/2009        Nasal septal deviation 04/14/2017   Marked   Numbness    R cheek stays numb   Obesity (BMI 30-39.9) 08/09/2012   Past Surgical History:  Procedure Laterality Date   ANKLE SURGERY Left 1966   with tendon repair with stainless steel wire   COLONOSCOPY  07/2017   multiple tubular adenomas, rpt 3 yrs ([facility])   INSERTION OF MESH Bilateral 09/06/2012   Procedure: INSERTION OF MESH;  Surgeon: Adin Hector, MD   LAPAROSCOPIC INGUINAL HERNIA WITH UMBILICAL HERNIA Bilateral 09/06/2012   Procedure: LAPAROSCOPIC exploration and repair of hernias in bellybutton and bilateral groins with mesh;  Surgeon: Adin Hector, MD   MICRODISCECTOMY LUMBAR Right 08/2020   L4/5 (Dr Zada Finders)   RIGHT/LEFT HEART CATH AND CORONARY ANGIOGRAPHY N/A 02/16/2021   Procedure: RIGHT/LEFT HEART CATH AND CORONARY ANGIOGRAPHY;  Surgeon: Adrian Prows, MD;  Location: [facility];  Service: Cardiovascular;  Laterality: N/A;   TEE WITHOUT CARDIOVERSION N/A 02/12/2021   Procedure: TRANSESOPHAGEAL ECHOCARDIOGRAM (TEE);  Surgeon: Adrian Prows, MD;  Location: [facility];  Service: Cardiovascular;  Laterality: N/A;   Family History  Problem Relation Age of Onset   Cancer Mother  breast   Congenital heart disease Mother    Diabetes Mother    COPD Father    Cancer Sister        lung (minimal smoker)   Colon cancer Neg Hx    Esophageal cancer Neg Hx    Liver cancer Neg Hx    Rectal cancer Neg Hx    Stomach cancer Neg Hx    Pancreatic cancer Neg Hx     Social History   Tobacco Use   Smoking status: Former    Packs/day: 2.00    Years: 40.00    Pack years:  80.00    Types: Cigarettes    Start date: 03/14/1968    Quit date: 03/14/2006    Years since quitting: 14.9   Smokeless tobacco: Never  Substance Use Topics   Alcohol use: Yes    Comment: Occasionally   Marital Status: Married   ROS  Review of Systems  Cardiovascular:  Positive for dyspnea on exertion (improving), leg swelling (improved), orthopnea (improved) and paroxysmal nocturnal dyspnea. Negative for chest pain.  Musculoskeletal:  Positive for back pain.  Gastrointestinal:  Negative for melena.   Objective  There were no vitals taken for this visit.  Vitals with BMI 02/16/2021 02/16/2021 02/16/2021  Height - - -  Weight - - -  BMI - - -  Systolic 237 628 315  Diastolic 76 66 72  Pulse 67 66 64     Physical Exam Vitals reviewed.  Constitutional:      Appearance: He is obese.  Neck:     Vascular: Carotid bruit (right) and JVD present.  Cardiovascular:     Rate and Rhythm: Normal rate and regular rhythm.     Pulses: Intact distal pulses.     Heart sounds: Murmur heard.  High-pitched blowing holosystolic murmur is present with a grade of 3/6 at the apex.    No gallop.  Pulmonary:     Effort: Pulmonary effort is normal.     Breath sounds: No rales.  Abdominal:     General: Bowel sounds are normal.     Palpations: Abdomen is soft.  Musculoskeletal:     Right lower leg: Edema (trace) present.     Left lower leg: Edema (trace) present.    Laboratory examination:   Recent Labs    05/06/20 0733 01/25/21 1300 02/08/21 0908 02/16/21 1133 02/16/21 1142 02/16/21 1147  NA 138 143 136 140   140 139 138  K 4.4 3.3* 4.4 4.3   4.3 4.6 4.6  CL 102 106 98  --   --   --   CO2 29 26 23   --   --   --   GLUCOSE 141* 101* 112*  --   --   --   BUN 22 20 25   --   --   --   CREATININE 1.15 0.91 1.24  --   --   --   CALCIUM 9.2 9.2 9.9  --   --   --     CrCl cannot be calculated (Patient's most recent lab result is older than the maximum 21 days allowed.).  CMP Latest Ref  Rng & Units 02/16/2021 02/16/2021 02/16/2021  Glucose 70 - 99 mg/dL - - -  BUN 8 - 27 mg/dL - - -  Creatinine 0.76 - 1.27 mg/dL - - -  Sodium 135 - 145 mmol/L 138 139 140  Potassium 3.5 - 5.1 mmol/L 4.6 4.6 4.3  Chloride 96 - 106 mmol/L - - -  CO2 20 - 29 mmol/L - - -  Calcium 8.6 - 10.2 mg/dL - - -  Total Protein 6.0 - 8.3 g/dL - - -  Total Bilirubin 0.2 - 1.2 mg/dL - - -  Alkaline Phos 39 - 117 U/L - - -  AST 0 - 37 U/L - - -  ALT 0 - 53 U/L - - -   CBC Latest Ref Rng & Units 02/16/2021 02/16/2021 02/16/2021  WBC 3.4 - 10.8 x10E3/uL - - -  Hemoglobin 13.0 - 17.0 g/dL 16.3 16.7 16.7  Hematocrit 39.0 - 52.0 % 48.0 49.0 49.0  Platelets 150 - 450 x10E3/uL - - -    Lipid Panel Recent Labs    05/06/20 0733  CHOL 137  TRIG 124.0  LDLCALC 77  VLDL 24.8  HDL 35.10*  CHOLHDL 4     HEMOGLOBIN A1C No results found for: HGBA1C, MPG TSH Recent Labs    01/25/21 1300  TSH 3.91     External labs:  None   Allergies  No Known Allergies    Medications Prior to Visit:   Outpatient Medications Prior to Visit  Medication Sig Dispense Refill   atorvastatin (LIPITOR) 20 MG tablet Take 1 tablet (20 mg total) by mouth daily. 30 tablet 2   b complex vitamins tablet Take 1 tablet by mouth daily.     furosemide (LASIX) 40 MG tablet TAKE 1 TABLET (40 MG TOTAL) BY MOUTH 2 (TWO) TIMES DAILY (IN THE MORNING AND AT 3PM) 60 tablet 1   losartan (COZAAR) 50 MG tablet Take 1 tablet (50 mg total) by mouth daily. 30 tablet 2   Omega-3 Fatty Acids (FISH OIL) 1000 MG CAPS Take 1 capsule (1,000 mg total) by mouth daily.     Omeprazole-Sodium Bicarbonate (ZEGERID OTC) 20-1100 MG CAPS capsule Take 1 capsule by mouth daily before breakfast. (Patient taking differently: Take 1 capsule by mouth daily as needed (Heartburn).) 28 each 0   spironolactone (ALDACTONE) 25 MG tablet Take 1 tablet (25 mg total) by mouth daily. 30 tablet 2   vitamin C (ASCORBIC ACID) 500 MG tablet Take 500 mg by mouth daily.      No facility-administered medications prior to visit.   Final Medications at End of Visit    No outpatient medications have been marked as taking for the 03/01/21 encounter (Appointment) with Rayetta Pigg, Argenis Kumari C, PA-C.   Radiology:   No results found.  Chest x-ray 01/25/2021: 1. Dramatic change in the appearance of the lungs, which could be indicative of interstitial lung disease, although lung volumes are normal. Accordingly, acute atypical infection is not excluded. Further clinical evaluation is recommended. Consideration for follow-up high-resolution chest CT is suggested if clinically appropriate. 2. Aortic atherosclerosis  Cardiac Studies:   Echocardiogram 01/26/2021:  1. Left ventricular ejection fraction, by estimation, is 70 to 75%. The left ventricle has hyperdynamic function. The left ventricle has no regional wall motion abnormalities. Left ventricular diastolic parameters are consistent with Grade II diastolic dysfunction (pseudonormalization).   2. Right ventricular systolic function is normal. The right ventricular size is normal. There is severely elevated pulmonary artery systolic pressure.   3. Left atrial size was moderately dilated.   4. Suspect flail middle scallop of the posterior mitral leaflet due to chordal rupture. The mitral valve is myxomatous. Severe mitral valve regurgitation. No evidence of mitral stenosis.   5. Tricuspid valve regurgitation is mild to moderate.   6. The aortic valve is tricuspid. Aortic valve regurgitation is not visualized. No  aortic stenosis is present.   EKG:   EKG 02/01/2021: Normal sinus rhythm at rate of 73 bpm, left axis deviation, left anterior fascicular block.  Incomplete right bundle branch block.   Assessment   No diagnosis found.    There are no discontinued medications.   No orders of the defined types were placed in this encounter.   Recommendations:   Ryan Mathews is a 75 y.o. hypertension, obesity  referred to me on an urgent basis for evaluation of mitral regurgitation and heart failure.  Patient establish care with our office with Dr. Einar Gip 02/01/2021.  Dr. Einar Gip had personally reviewed echocardiogram and felt he is in acute decompensated diastolic heart failure with severe MR, flail mitral leaflet cannot be excluded.  At last office visit patient was advised not to exert himself, increased Lasix to 40 mg twice daily, switched from amlodipine to losartan 50 mg daily and added spironolactone 25 mg daily.  Unfortunately repeat BMP and CBC as well as BNP have not been done.  Patient is recommended for urgent TEE as well as right and left heart catheterization as he likely needs mitral valve repair.  Also given aortic atherosclerosis the last office visit started patient on atorvastatin and set him up for carotid artery duplex due to bruit on exam.  Patient presents for 1 week follow-up.  Clinically patient's volume status has significantly improved.  Bilateral leg edema has essentially resolved and BNP is now normal.  We will proceed with TEE and right and left heart catheterization as scheduled given stable CBC and BMP.  Advised patient to reduce Lasix to 40 mg once daily as symptoms and edema have significantly improved.  Suspect patient's dyspnea is a factorial including underlying pulmonary etiology given patient's history of COPD.  Patient's chest x-ray and CT abnormalities are concerning for pulmonary edema, however symptoms are disproportionate to findings.  Suspect patient may have component of underlying pulmonary fibrosis.  Given suspicion for pulmonary etiology contributing to patient's symptoms as well as potential need for atrial valve repair in the future will refer patient for pulmonology evaluation at this time.  Follow-up after cardiac catheterization.  Patient was seen in collaboration with Dr. Einar Gip and he is in agreement with the plan.     Alethia Berthold, PA-C 03/01/2021,  9:26 AM Office: [phone]

## 2021-03-02 ENCOUNTER — Ambulatory Visit: Payer: Medicare Other | Admitting: Student

## 2021-03-10 DIAGNOSIS — M5126 Other intervertebral disc displacement, lumbar region: Secondary | ICD-10-CM | POA: Diagnosis not present

## 2021-03-10 DIAGNOSIS — Z6831 Body mass index (BMI) 31.0-31.9, adult: Secondary | ICD-10-CM | POA: Diagnosis not present

## 2021-03-11 ENCOUNTER — Ambulatory Visit (INDEPENDENT_AMBULATORY_CARE_PROVIDER_SITE_OTHER): Payer: Medicare Other

## 2021-03-11 ENCOUNTER — Ambulatory Visit (INDEPENDENT_AMBULATORY_CARE_PROVIDER_SITE_OTHER): Payer: Medicare Other | Admitting: Pulmonary Disease

## 2021-03-11 ENCOUNTER — Encounter: Payer: Self-pay | Admitting: Pulmonary Disease

## 2021-03-11 ENCOUNTER — Other Ambulatory Visit: Payer: Self-pay

## 2021-03-11 VITALS — BP 128/64 | HR 68 | Temp 97.0°F | Ht 68.0 in | Wt 205.0 lb

## 2021-03-11 DIAGNOSIS — I340 Nonrheumatic mitral (valve) insufficiency: Secondary | ICD-10-CM

## 2021-03-11 DIAGNOSIS — R0609 Other forms of dyspnea: Secondary | ICD-10-CM

## 2021-03-11 DIAGNOSIS — R0600 Dyspnea, unspecified: Secondary | ICD-10-CM | POA: Diagnosis not present

## 2021-03-11 NOTE — Patient Instructions (Signed)
Nice to meet you  I am glad you are feeling much better breathing wise after taking the Lasix and getting rid of all that water weight.  Your chest x-ray on 01/25/2021 in my opinion looks like volume overload or edema.  We will repeat a chest x-ray today to make sure that your chest x-ray has improved with your symptoms improving on the Lasix.  I ordered PFTs in anticipation of your upcoming surgery.  If you can contact the thoracic surgeon office, tell them that your lung doctor has ordered PFTs.  Ask if they would prefer the PFTs to be done at the office at [facility] and if so you can cancel the PFT appointment here in [place].  If there is an abnormality in the chest x-ray or PFTs that we need to follow-up on I will arrange follow-up.  Otherwise follow-up as needed.  Please contact us if we can help in any way.

## 2021-03-11 NOTE — Progress Notes (Signed)
@Patient  ID: Ryan Mathews, male    DOB: [birth year], 75 y.o.   MRN: [id number]  Chief Complaint  Patient presents with   Consult    Consult for COPD. Saw [facility] 10 years ago. Some SHOB at times if pt does too much. Does have heart issues noted. possible surgical clearance?     Referring provider: Ria Bush, MD  HPI:   75 y.o. man whom we are seeing in consultation for abnormal chest x-ray.  Most recent pulmonary note from Dr. Elsworth Soho 2014 reviewed.  Most recent PCP note reviewed.  Most recent cardiology note reviewed.  Patient was in usual state of health.  Developed worsening shortness of breath.  Presented to PCP 01/25/2021.  chest x-ray obtained that day is reviewed and interpreted as increased interstitial markings as well as bilateral pleural effusions consistent with pulmonary edema and volume overload.  Concern for interstitial lung disease per radiology report.  This prompted referral.  In the interim, patient was found to have severe mitral valve regurgitation.  He was placed on Lasix.  His shortness of breath is greatly improved.  He reports a 15 pound loss of water weight.  He has upcoming consultation with cardiothoracic surgeon at [facility] for what he reports is robotic surgical repair of the mitral valve.  Reviewed CT chest 12/2019 that on my review and interpretation reveals emphysema, no ILD or evidence of scarring.  Reviewed right heart catheterization 02/12/2021 that shows normal RA pressure, normal mean pulmonary pressure of 18, mean wedge of 12, LVEDP of 8 on the left heart catheterization.  PMH: Hypertension, hyperlipidemia, mitral valve regurg Surgical history: Heart valve surgery, Back surgery Family history: CAD in first relatives, no significant respiratory issues and close relatives Social history: Quit 2014, 80-pack-year history, lives in [place] / Pulmonary Flowsheets:   ACT:  No flowsheet data found.  MMRC: No flowsheet data  found.  Epworth:  No flowsheet data found.  Tests:   FENO:  No results found for: NITRICOXIDE  PFT: No flowsheet data found.  WALK:  No flowsheet data found.  Imaging: Personally reviewed and as per EMR discussion this note CARDIAC CATHETERIZATION  Result Date: 02/16/2021 Right and left heart catheterization 02/16/2021: RA 5/5, mean 2 mmHg, RA saturation 70%. RV 32/5, EDP 8 mmHg. PA 34/5, mean 18 mmHg.  PA saturation 69%. PW 7/23, mean 12 mmHg.  Giant V waves evident suggestive of severe MR.  Aortic saturation 100%. LV 122/-1, EDP 8 mmHg.  Ao 109/58, mean 75 mmHg.  No pressure gradient across the aortic valve. LM: Large vessel, mid to distal segment is mildly ectatic. LAD: Large vessel, gives origin to small diagonals, mild disease evident. LCx: Moderate caliber vessel, minimal disease. RI: Moderate caliber vessel.  Minimal ostial disease. RCA: Large-caliber vessel, dominant.  Minimal disease present. Impression and recommendation: Patient will need mitral valve repair.  50 mL contrast utilized.  No immediate complications.   ECHO TEE  Result Date: 02/13/2021    TRANSESOPHOGEAL ECHO REPORT   Patient Name:   Ryan Mathews Date of Exam: 02/12/2021 Medical Rec #:  [id number]          Height:       68.0 in Accession #:    [id number]         Weight:       210.1 lb Date of Birth:  08-Feb-1946           BSA:          2.087  m Patient Age:    75 years           BP:           124/80 mmHg Patient Gender: M                  HR:           76 bpm. Exam Location:  Inpatient Procedure: Transesophageal Echo, 3D Echo, Cardiac Doppler and Color Doppler Indications:    I34.0 Nonrheumatic mitral (valve) insufficiency  History:        Patient has prior history of Echocardiogram examinations, most                 recent 01/26/2021.  Sonographer:    Philipp Deputy RDCS Referring Phys: [id number] Adrian Prows PROCEDURE: After discussion of the risks and benefits of a TEE, an informed consent was obtained from the patient.  The transesophogeal probe was passed without difficulty through the esophogus of the patient. Imaged were obtained with the patient in a left lateral decubitus position. Sedation performed by different physician. The patient was monitored while under deep sedation. Anesthestetic sedation was provided intravenously by Anesthesiology: 76.24mg  of Propofol. Image quality was good. The patient  developed no complications during the procedure. IMPRESSIONS  1. Left ventricular ejection fraction, by estimation, is 60 to 65%. The left ventricle has normal function. The left ventricle has no regional wall motion abnormalities.  2. Right ventricular systolic function is normal. The right ventricular size is normal.  3. Left atrial size was moderately dilated. No left atrial/left atrial appendage thrombus was detected.  4. The mitral valve is myxomatous. Severe mitral valve regurgitation. No evidence of mitral stenosis. There is severe holosystolic prolapse of multiple scallops of the posterior leaflet of the mitral valve.  5. The aortic valve is normal in structure. Aortic valve regurgitation is not visualized. No aortic stenosis is present.  6. There is mild (Grade II) plaque involving the aortic arch and descending aorta.  7. There is reversal of flow in both upper and lower right pulmonary veins and left upper pulmonary vein. FINDINGS  Left Ventricle: Left ventricular ejection fraction, by estimation, is 60 to 65%. The left ventricle has normal function. The left ventricle has no regional wall motion abnormalities. The left ventricular internal cavity size was normal in size. There is  no left ventricular hypertrophy. Right Ventricle: The right ventricular size is normal. No increase in right ventricular wall thickness. Right ventricular systolic function is normal. Left Atrium: Left atrial size was moderately dilated. No left atrial/left atrial appendage thrombus was detected. Right Atrium: Right atrial size was normal in  size. Pericardium: There is no evidence of pericardial effusion. Mitral Valve: The mitral valve is myxomatous. There is severe holosystolic prolapse of multiple scallops of the posterior leaflet of the mitral valve. There is moderate thickening of the mitral valve leaflet(s). Severe mitral valve regurgitation, with anteriorly-directed jet. No evidence of mitral valve stenosis. Tricuspid Valve: The tricuspid valve is normal in structure. Tricuspid valve regurgitation is not demonstrated. No evidence of tricuspid stenosis. Aortic Valve: The aortic valve is normal in structure. Aortic valve regurgitation is not visualized. No aortic stenosis is present. Pulmonic Valve: The pulmonic valve was normal in structure. Pulmonic valve regurgitation is trivial. No evidence of pulmonic stenosis. Aorta: The aortic root is normal in size and structure. There is mild (Grade II) plaque involving the aortic arch and descending aorta. Venous: There is reversal of flow in both upper and lower  right pulmonary veins and left upper pulmonary vein. A pattern of systolic flow reversal, suggestive of severe mitral regurgitation is recorded from the left upper pulmonary vein, the right upper pulmonary vein and the right lower pulmonary vein. IAS/Shunts: No atrial level shunt detected by color flow Doppler. Agitated saline contrast was given intravenously to evaluate for intracardiac shunting. Adrian Prows MD Electronically signed by Adrian Prows MD Signature Date/Time: 02/13/2021/3:00:59 PM    Final    PCV CAROTID DUPLEX (BILATERAL)  Result Date: 02/13/2021 Carotid artery duplex  02/09/2021: The bifurcation, internal, external and common carotid arteries reveal no evidence of significant stenosis, bilaterally. Antegrade right vertebral artery flow. Antegrade left vertebral artery flow.   Lab Results: Personally reviewed CBC    Component Value Date/Time   WBC 9.7 02/08/2021 0908   WBC 8.7 01/25/2021 1300   RBC 5.75 02/08/2021 0908   RBC  4.99 01/25/2021 1300   HGB 16.3 02/16/2021 1147   HGB 18.4 (H) 02/08/2021 0908   HCT 48.0 02/16/2021 1147   HCT 53.6 (H) 02/08/2021 0908   PLT 245 02/08/2021 0908   MCV 93 02/08/2021 0908   MCH 32.0 02/08/2021 0908   MCH 31.9 08/27/2012 1500   MCHC 34.3 02/08/2021 0908   MCHC 34.0 01/25/2021 1300   RDW 12.2 02/08/2021 0908   LYMPHSABS 2.3 01/25/2021 1300   MONOABS 0.7 01/25/2021 1300   EOSABS 0.2 01/25/2021 1300   BASOSABS 0.1 01/25/2021 1300    BMET    Component Value Date/Time   NA 138 02/16/2021 1147   NA 136 02/08/2021 0908   K 4.6 02/16/2021 1147   CL 98 02/08/2021 0908   CO2 23 02/08/2021 0908   GLUCOSE 112 (H) 02/08/2021 0908   GLUCOSE 101 (H) 01/25/2021 1300   BUN 25 02/08/2021 0908   CREATININE 1.24 02/08/2021 0908   CALCIUM 9.9 02/08/2021 0908   GFRNONAA 88 (L) 03/19/2011 0025   GFRAA >90 03/19/2011 0025    BNP    Component Value Date/Time   BNP 11.0 02/08/2021 0909    ProBNP    Component Value Date/Time   PROBNP 112.0 (H) 01/25/2021 1300    Specialty Problems       Pulmonary Problems   Bullous emphysema (HCC)    Mild - FEv1 77% Marked biapical bullous type emphysema by CT 04/2017 Diffuse bronchial wall thickening with severe centrilobular and paraseptal emphysema by CT 12/2017 CT 11/2018 - moderate to advanced centrilobular and paraseptal emphysema. Bilateral peripheral subpleural reticulation and banding with a lower lung zone predominance is again noted. No frank honeycombing      Nasal septal deviation    Marked      Exertional dyspnea    No Known Allergies  Immunization History  Administered Date(s) Administered   Influenza Split 11/13/2010   Influenza Whole 12/13/2011   Influenza, High Dose Seasonal PF 12/22/2016, 01/25/2018, 11/23/2018, 11/29/2019, 12/18/2020   Influenza,inj,Quad PF,6+ Mos 12/22/2015   Influenza-Unspecified 01/16/2021   PFIZER(Purple Top)SARS-COV-2 Vaccination 04/05/2019, 04/26/2019, 12/16/2019   Pfizer  Covid-19 Vaccine Bivalent Booster 31yrs & up 01/08/2021   Pneumococcal Conjugate-13 10/15/2015   Pneumococcal Polysaccharide-23 05/03/2011, 04/21/2017   Zoster, Live 01/13/2012    Past Medical History:  Diagnosis Date   Bilateral inguinal hernia (BIH) s/p lap repair 09/06/2012 08/09/2012   Cataract    CMC arthritis, thumb, degenerative    right   COPD (chronic obstructive pulmonary disease) (HCC)    Emphysema of lung (HCC)    Ex-smoker    GERD (gastroesophageal reflux disease)  History of chicken pox    History of pneumonia 08/06/2009        Nasal septal deviation 04/14/2017   Marked   Numbness    R cheek stays numb   Obesity (BMI 30-39.9) 08/09/2012    Tobacco History: Social History   Tobacco Use  Smoking Status Former   Packs/day: 2.00   Years: 40.00   Pack years: 80.00   Types: Cigarettes   Start date: 03/14/1968   Quit date: 03/14/2006   Years since quitting: 15.0  Smokeless Tobacco Never   Counseling given: Not Answered   Continue to not smoke  Outpatient Encounter Medications as of 03/11/2021  Medication Sig   atorvastatin (LIPITOR) 20 MG tablet Take 1 tablet (20 mg total) by mouth daily.   b complex vitamins tablet Take 1 tablet by mouth daily.   furosemide (LASIX) 40 MG tablet TAKE 1 TABLET (40 MG TOTAL) BY MOUTH 2 (TWO) TIMES DAILY (IN THE MORNING AND AT 3PM)   losartan (COZAAR) 50 MG tablet Take 1 tablet (50 mg total) by mouth daily.   Omega-3 Fatty Acids (FISH OIL) 1000 MG CAPS Take 1 capsule (1,000 mg total) by mouth daily.   Omeprazole-Sodium Bicarbonate (ZEGERID OTC) 20-1100 MG CAPS capsule Take 1 capsule by mouth daily before breakfast. (Patient taking differently: Take 1 capsule by mouth daily as needed (Heartburn).)   spironolactone (ALDACTONE) 25 MG tablet Take 1 tablet (25 mg total) by mouth daily.   vitamin C (ASCORBIC ACID) 500 MG tablet Take 500 mg by mouth daily.   No facility-administered encounter medications on file as of 03/11/2021.      Review of Systems  Review of Systems  No chest pain with exertion.  No orthopnea or PND.  Comprehensive review of systems otherwise negative. Physical Exam  BP 128/64 (BP Location: Left Arm, Patient Position: Sitting, Cuff Size: Normal)    Pulse 68    Temp (!) 97 F (36.1 C) (Oral)    Ht 5\' 8"  (1.727 m)    Wt 205 lb (93 kg)    SpO2 98%    BMI 31.17 kg/m   Wt Readings from Last 5 Encounters:  03/11/21 205 lb (93 kg)  03/01/21 205 lb (93 kg)  02/16/21 206 lb (93.4 kg)  02/12/21 210 lb 1.6 oz (95.3 kg)  02/09/21 210 lb (95.3 kg)    BMI Readings from Last 5 Encounters:  03/11/21 31.17 kg/m  03/01/21 31.17 kg/m  02/16/21 31.32 kg/m  02/12/21 31.95 kg/m  02/09/21 31.93 kg/m     Physical Exam General: Well-appearing, no acute distress Eyes: EOMI, icterus Neck: Supple, no JVP Cardiovascular: Heart sounds distant, warm, no edema Pulmonary: Clear, normal work of breathing Abdomen: Nondistended, bowel sounds present MSK: No synovitis, no joint effusion Neuro: Normal gait, no weakness   Assessment & Plan:   Abnormal chest x-ray: In the setting of clinical volume overload per his report.  Chest x-ray on my review interpretation is as with pulmonary edema and small bilateral pleural effusions.  We will repeat chest x-ray today to evaluate for improvement with improved symptoms after Lasix.  Low suspicion for fibrosis or other ILD given normal CT scan 12/2019 and improvement in symptoms with Lasix.  The timing of possible rapid progression on imaging and the improvement in symptoms is not consistent with ILD.  Mitral valve needing repair: Planned robotic surgery at [facility].  Has upcoming consultation with thoracic surgery at [facility] 04/05/21.  PFTs ordered in anticipation of preoperative clearance given intrathoracic surgery.  He is to contact his surgeons office at [facility] and if they prefer to have the PFTs performed) that is fine, he can cancel PFT appointment here in  [place].  Tobacco abuse in remission: Quit 2014.  Low-dose CT screening scan was delayed given his recent cardiac work-up for mitral valve issues.  Encouraged him to reschedule screening CT scan.   Return if symptoms worsen or fail to improve.   Lanier Clam, MD 03/11/2021

## 2021-03-15 ENCOUNTER — Encounter (HOSPITAL_COMMUNITY): Payer: Self-pay | Admitting: Emergency Medicine

## 2021-03-15 ENCOUNTER — Other Ambulatory Visit: Payer: Self-pay

## 2021-03-15 ENCOUNTER — Emergency Department (HOSPITAL_COMMUNITY)
Admission: EM | Admit: 2021-03-15 | Discharge: 2021-03-16 | Discharge status: Against Medical Advice | Payer: Medicare Other | Attending: Emergency Medicine | Admitting: Emergency Medicine

## 2021-03-15 ENCOUNTER — Emergency Department (HOSPITAL_COMMUNITY): Payer: Medicare Other

## 2021-03-15 DIAGNOSIS — R0602 Shortness of breath: Secondary | ICD-10-CM | POA: Diagnosis not present

## 2021-03-15 DIAGNOSIS — R079 Chest pain, unspecified: Secondary | ICD-10-CM | POA: Insufficient documentation

## 2021-03-15 DIAGNOSIS — I509 Heart failure, unspecified: Secondary | ICD-10-CM | POA: Diagnosis not present

## 2021-03-15 DIAGNOSIS — R0789 Other chest pain: Secondary | ICD-10-CM | POA: Diagnosis not present

## 2021-03-15 DIAGNOSIS — J9811 Atelectasis: Secondary | ICD-10-CM | POA: Diagnosis not present

## 2021-03-15 DIAGNOSIS — Z5321 Procedure and treatment not carried out due to patient leaving prior to being seen by health care provider: Secondary | ICD-10-CM | POA: Insufficient documentation

## 2021-03-15 DIAGNOSIS — J449 Chronic obstructive pulmonary disease, unspecified: Secondary | ICD-10-CM | POA: Diagnosis not present

## 2021-03-15 LAB — CBC
HCT: 46.7 % (ref 39.0–52.0)
Hemoglobin: 16.3 g/dL (ref 13.0–17.0)
MCH: 32.6 pg (ref 26.0–34.0)
MCHC: 34.9 g/dL (ref 30.0–36.0)
MCV: 93.4 fL (ref 80.0–100.0)
Platelets: 207 10*3/uL (ref 150–400)
RBC: 5 MIL/uL (ref 4.22–5.81)
RDW: 12.2 % (ref 11.5–15.5)
WBC: 15.2 10*3/uL — ABNORMAL HIGH (ref 4.0–10.5)
nRBC: 0 % (ref 0.0–0.2)

## 2021-03-15 LAB — BASIC METABOLIC PANEL
Anion gap: 9 (ref 5–15)
BUN: 27 mg/dL — ABNORMAL HIGH (ref 8–23)
CO2: 22 mmol/L (ref 22–32)
Calcium: 9.4 mg/dL (ref 8.9–10.3)
Chloride: 103 mmol/L (ref 98–111)
Creatinine, Ser: 1.23 mg/dL (ref 0.61–1.24)
GFR, Estimated: >60 mL/min (ref >60)
Glucose, Bld: 166 mg/dL — ABNORMAL HIGH (ref 70–99)
Potassium: 4 mmol/L (ref 3.5–5.1)
Sodium: 134 mmol/L — ABNORMAL LOW (ref 135–145)

## 2021-03-15 LAB — BRAIN NATRIURETIC PEPTIDE: B Natriuretic Peptide: 33.7 pg/mL (ref 0.0–100.0)

## 2021-03-15 LAB — TROPONIN I (HIGH SENSITIVITY): Troponin I (High Sensitivity): 5 ng/L (ref <18)

## 2021-03-15 NOTE — ED Provider Notes (Signed)
Emergency Medicine Provider Triage Evaluation Note  Ryan Mathews , a 76 y.o. male  was evaluated in triage.  Pt complains of chest pain since earlier this afternoon.  Patient does have a history of heart failure.  He states that he had a late lunch with his wife and started getting some abdominal pain and back pain and got home and started vomiting.  Since then he has been having substernal chest pain.  Reports associated shortness of breath.  No fever, chills, leg pain, leg swelling.  Review of Systems  Positive:  Negative: See above   Physical Exam  BP (!) 157/70    Pulse 60    Temp 98.1 F (36.7 C) (Oral)    Resp (!) 24    Ht 5\' 8"  (1.727 m)    Wt 102 kg    SpO2 99%    BMI 34.19 kg/m  Gen:   Awake, no distress   Resp:  Tachypnic  MSK:   Moves extremities without difficulty  Other:    Medical Decision Making  Medically screening exam initiated at 9:54 PM.  Appropriate orders placed.  AZARIAH BONURA was informed that the remainder of the evaluation will be completed by another provider, this initial triage assessment does not replace that evaluation, and the importance of remaining in the ED until their evaluation is complete.     Hendricks Limes, PA-C 03/15/21 2158    Jeanell Sparrow, DO 03/16/21 [phone]

## 2021-03-15 NOTE — ED Triage Notes (Signed)
Patient reports central/left chest pain with SOB this evening , no cough or fever , denies emesis or diaphoresis . History of CHF/COPD .

## 2021-03-16 DIAGNOSIS — R079 Chest pain, unspecified: Secondary | ICD-10-CM | POA: Diagnosis not present

## 2021-03-16 LAB — TROPONIN I (HIGH SENSITIVITY): Troponin I (High Sensitivity): 8 ng/L (ref <18)

## 2021-03-16 NOTE — ED Notes (Signed)
Pt called for vitals, no response. 

## 2021-03-22 DIAGNOSIS — D0471 Carcinoma in situ of skin of right lower limb, including hip: Secondary | ICD-10-CM | POA: Diagnosis not present

## 2021-03-22 DIAGNOSIS — Z23 Encounter for immunization: Secondary | ICD-10-CM | POA: Diagnosis not present

## 2021-04-05 DIAGNOSIS — J449 Chronic obstructive pulmonary disease, unspecified: Secondary | ICD-10-CM | POA: Diagnosis not present

## 2021-04-05 DIAGNOSIS — I340 Nonrheumatic mitral (valve) insufficiency: Secondary | ICD-10-CM | POA: Diagnosis not present

## 2021-04-07 ENCOUNTER — Ambulatory Visit (INDEPENDENT_AMBULATORY_CARE_PROVIDER_SITE_OTHER): Payer: Medicare Other | Admitting: Pulmonary Disease

## 2021-04-07 ENCOUNTER — Other Ambulatory Visit: Payer: Self-pay

## 2021-04-07 DIAGNOSIS — R0609 Other forms of dyspnea: Secondary | ICD-10-CM

## 2021-04-07 DIAGNOSIS — I340 Nonrheumatic mitral (valve) insufficiency: Secondary | ICD-10-CM

## 2021-04-07 LAB — PULMONARY FUNCTION TEST
DL/VA % pred: 52 %
DL/VA: 2.1 ml/min/mmHg/L
DLCO cor % pred: 53 %
DLCO cor: 12.7 ml/min/mmHg
DLCO unc % pred: 55 %
DLCO unc: 13.27 ml/min/mmHg
FEF 25-75 Post: 1.9 L/sec
FEF 25-75 Pre: 1.24 L/sec
FEF2575-%Change-Post: 53 %
FEF2575-%Pred-Post: 90 %
FEF2575-%Pred-Pre: 59 %
FEV1-%Change-Post: 8 %
FEV1-%Pred-Post: 89 %
FEV1-%Pred-Pre: 82 %
FEV1-Post: 2.56 L
FEV1-Pre: 2.36 L
FEV1FVC-%Change-Post: -3 %
FEV1FVC-%Pred-Pre: 88 %
FEV6-%Change-Post: 9 %
FEV6-%Pred-Post: 107 %
FEV6-%Pred-Pre: 97 %
FEV6-Post: 3.97 L
FEV6-Pre: 3.61 L
FEV6FVC-%Change-Post: -2 %
FEV6FVC-%Pred-Post: 103 %
FEV6FVC-%Pred-Pre: 105 %
FVC-%Change-Post: 12 %
FVC-%Pred-Post: 104 %
FVC-%Pred-Pre: 92 %
FVC-Post: 4.1 L
FVC-Pre: 3.65 L
Post FEV1/FVC ratio: 62 %
Post FEV6/FVC ratio: 97 %
Pre FEV1/FVC ratio: 65 %
Pre FEV6/FVC Ratio: 99 %
RV % pred: 94 %
RV: 2.28 L
TLC % pred: 97 %
TLC: 6.44 L

## 2021-04-07 NOTE — Progress Notes (Signed)
Full PFT performed today. °

## 2021-04-07 NOTE — Patient Instructions (Signed)
Full PFT performed today. °

## 2021-04-07 NOTE — Progress Notes (Signed)
PFTs suggest he would benefit from inhaler to treat underlying asthma.  Rest of PFTs largely normal.  1 marker shows evidence of emphysema that we see on his CT scan most recently in 2021.  If he is having some shortness of breath will prescribe inhaler. If not, we can hold on new medication for now.

## 2021-04-09 ENCOUNTER — Telehealth: Payer: Self-pay | Admitting: Pulmonary Disease

## 2021-04-09 NOTE — Telephone Encounter (Signed)
Patient's wife filled out medical record release for records to be sent to [facility], Dr. Durward Mallard, however there is no signature on it. Left voicemail for patient to come in a sign form. Form was placed in the filing cabinet at front desk.

## 2021-04-14 DIAGNOSIS — I5032 Chronic diastolic (congestive) heart failure: Secondary | ICD-10-CM | POA: Insufficient documentation

## 2021-04-15 NOTE — Telephone Encounter (Signed)
Medical records faxed to Dr. Durward Mallard at [phone]. Nothing further needed.

## 2021-04-28 DIAGNOSIS — D62 Acute posthemorrhagic anemia: Secondary | ICD-10-CM | POA: Diagnosis not present

## 2021-04-28 DIAGNOSIS — I340 Nonrheumatic mitral (valve) insufficiency: Secondary | ICD-10-CM | POA: Diagnosis not present

## 2021-04-28 DIAGNOSIS — J811 Chronic pulmonary edema: Secondary | ICD-10-CM | POA: Diagnosis not present

## 2021-04-28 DIAGNOSIS — G8918 Other acute postprocedural pain: Secondary | ICD-10-CM | POA: Diagnosis not present

## 2021-04-28 DIAGNOSIS — R739 Hyperglycemia, unspecified: Secondary | ICD-10-CM | POA: Diagnosis not present

## 2021-04-28 DIAGNOSIS — Z87891 Personal history of nicotine dependence: Secondary | ICD-10-CM | POA: Diagnosis not present

## 2021-04-28 DIAGNOSIS — I361 Nonrheumatic tricuspid (valve) insufficiency: Secondary | ICD-10-CM | POA: Diagnosis not present

## 2021-04-28 DIAGNOSIS — T797XXA Traumatic subcutaneous emphysema, initial encounter: Secondary | ICD-10-CM | POA: Diagnosis not present

## 2021-04-28 DIAGNOSIS — I511 Rupture of chordae tendineae, not elsewhere classified: Secondary | ICD-10-CM | POA: Diagnosis not present

## 2021-04-28 DIAGNOSIS — J95821 Acute postprocedural respiratory failure: Secondary | ICD-10-CM | POA: Diagnosis not present

## 2021-04-28 DIAGNOSIS — J90 Pleural effusion, not elsewhere classified: Secondary | ICD-10-CM | POA: Diagnosis not present

## 2021-04-28 DIAGNOSIS — I517 Cardiomegaly: Secondary | ICD-10-CM | POA: Diagnosis not present

## 2021-04-28 DIAGNOSIS — E785 Hyperlipidemia, unspecified: Secondary | ICD-10-CM | POA: Diagnosis not present

## 2021-04-28 DIAGNOSIS — Z452 Encounter for adjustment and management of vascular access device: Secondary | ICD-10-CM | POA: Diagnosis not present

## 2021-04-28 DIAGNOSIS — I959 Hypotension, unspecified: Secondary | ICD-10-CM | POA: Diagnosis not present

## 2021-04-28 DIAGNOSIS — I5032 Chronic diastolic (congestive) heart failure: Secondary | ICD-10-CM | POA: Diagnosis not present

## 2021-04-28 DIAGNOSIS — I110 Hypertensive heart disease with heart failure: Secondary | ICD-10-CM | POA: Diagnosis not present

## 2021-04-28 DIAGNOSIS — D72828 Other elevated white blood cell count: Secondary | ICD-10-CM | POA: Diagnosis not present

## 2021-04-28 DIAGNOSIS — J984 Other disorders of lung: Secondary | ICD-10-CM | POA: Diagnosis not present

## 2021-04-28 DIAGNOSIS — J9811 Atelectasis: Secondary | ICD-10-CM | POA: Diagnosis not present

## 2021-04-28 DIAGNOSIS — Z7982 Long term (current) use of aspirin: Secondary | ICD-10-CM | POA: Diagnosis not present

## 2021-04-28 DIAGNOSIS — Z20822 Contact with and (suspected) exposure to covid-19: Secondary | ICD-10-CM | POA: Diagnosis not present

## 2021-04-28 DIAGNOSIS — J449 Chronic obstructive pulmonary disease, unspecified: Secondary | ICD-10-CM | POA: Diagnosis not present

## 2021-04-28 DIAGNOSIS — Z4682 Encounter for fitting and adjustment of non-vascular catheter: Secondary | ICD-10-CM | POA: Diagnosis not present

## 2021-04-29 DIAGNOSIS — Z9889 Other specified postprocedural states: Secondary | ICD-10-CM | POA: Insufficient documentation

## 2021-04-29 DIAGNOSIS — I340 Nonrheumatic mitral (valve) insufficiency: Secondary | ICD-10-CM | POA: Insufficient documentation

## 2021-04-29 HISTORY — PX: MITRAL VALVE REPAIR: SHX2039

## 2021-04-29 HISTORY — DX: Other specified postprocedural states: Z98.890

## 2021-04-30 DIAGNOSIS — Z9889 Other specified postprocedural states: Secondary | ICD-10-CM | POA: Insufficient documentation

## 2021-05-04 ENCOUNTER — Other Ambulatory Visit: Payer: Self-pay | Admitting: Cardiology

## 2021-05-04 DIAGNOSIS — I700 Atherosclerosis of aorta: Secondary | ICD-10-CM

## 2021-05-04 DIAGNOSIS — I5031 Acute diastolic (congestive) heart failure: Secondary | ICD-10-CM

## 2021-05-07 ENCOUNTER — Other Ambulatory Visit: Payer: Medicare Other

## 2021-05-10 ENCOUNTER — Ambulatory Visit: Payer: Medicare Other

## 2021-05-14 ENCOUNTER — Encounter: Payer: Medicare Other | Admitting: Family Medicine

## 2021-05-17 ENCOUNTER — Encounter (HOSPITAL_COMMUNITY): Payer: Self-pay | Admitting: *Deleted

## 2021-05-17 DIAGNOSIS — Z7982 Long term (current) use of aspirin: Secondary | ICD-10-CM | POA: Diagnosis not present

## 2021-05-17 DIAGNOSIS — Z9889 Other specified postprocedural states: Secondary | ICD-10-CM | POA: Diagnosis not present

## 2021-05-17 DIAGNOSIS — J90 Pleural effusion, not elsewhere classified: Secondary | ICD-10-CM | POA: Diagnosis not present

## 2021-05-17 DIAGNOSIS — Z48812 Encounter for surgical aftercare following surgery on the circulatory system: Secondary | ICD-10-CM | POA: Diagnosis not present

## 2021-05-17 DIAGNOSIS — R000 Tachycardia, unspecified: Secondary | ICD-10-CM | POA: Diagnosis not present

## 2021-05-17 DIAGNOSIS — Z952 Presence of prosthetic heart valve: Secondary | ICD-10-CM | POA: Diagnosis not present

## 2021-05-17 DIAGNOSIS — R918 Other nonspecific abnormal finding of lung field: Secondary | ICD-10-CM | POA: Diagnosis not present

## 2021-05-17 NOTE — Progress Notes (Signed)
Received referral notification from Dr. Cheree Ditto of this pt eligibility to participate in Cardiac Rehab s/p 2/16 MV Repair at [facility].  Reviewed notes of this admission in [place].  Pt has post hospitalization surgical follow up this morning 3/6.  Reviewed progress notes in [facility] for cardiology and PCP follow up.  Pt seen and referred to [facility] for MV Replacement/Repair by Dr. Einar Gip.  Pt has upcoming follow up appt on 3/20.  Will ask Dr. Einar Gip as the provider this pt will continue to follow for cardiology needs for formal MD referral for Cardiac rehab along with request for 12 lead ekg.  Once this pt has completed both appts satisfactorily, will be able to proceed with scheduling for cardiac rehab. Cherre Huger, BSN ?Cardiac and Pulmonary Rehab Nurse Navigator  ? ?

## 2021-05-20 ENCOUNTER — Telehealth (HOSPITAL_COMMUNITY): Payer: Self-pay

## 2021-05-20 NOTE — Telephone Encounter (Signed)
Pt called and stated that he wanted to schedule for cardiac rehab. I advised pt that he would have to complete his f/u with Dr. Einar Gip on 3/20 and to ask him to send a cardiac rehab referral to [place] and to ask for a copy of a 12 lead EKG pt stated that he would. ?

## 2021-05-31 ENCOUNTER — Encounter: Payer: Self-pay | Admitting: Student

## 2021-05-31 ENCOUNTER — Ambulatory Visit: Payer: Medicare Other | Admitting: Student

## 2021-05-31 ENCOUNTER — Other Ambulatory Visit: Payer: Self-pay

## 2021-05-31 VITALS — BP 125/78 | HR 97 | Temp 98.1°F | Resp 17 | Ht 68.0 in | Wt 202.8 lb

## 2021-05-31 DIAGNOSIS — I5030 Unspecified diastolic (congestive) heart failure: Secondary | ICD-10-CM

## 2021-05-31 DIAGNOSIS — I10 Essential (primary) hypertension: Secondary | ICD-10-CM | POA: Diagnosis not present

## 2021-05-31 DIAGNOSIS — Z9889 Other specified postprocedural states: Secondary | ICD-10-CM | POA: Diagnosis not present

## 2021-05-31 NOTE — Progress Notes (Signed)
? ?Primary Physician/Referring:  Ria Bush, MD ? ?Patient ID: Ryan Mathews, male    DOB: Jun 16, 1945, 76 y.o.   MRN: [id number] ? ?Chief Complaint  ?Patient presents with  ? MR  ? Hypertension  ? [name]  ?  3 MONTH  ? ?HPI:   ? ?Ryan Mathews  is a 76 y.o. Caucasian male with history of smoking and COPD, hypertension.  Patient presented to PCPs office 01/25/2021 with exertional dyspnea, PCP subsequently ordered echocardiogram which revealed hyperdynamic LV with LVEF of 10-25%, grade 2 diastolic dysfunction and severe mitral valve regurgitation with flail middle scallop of the posterior mitral leaflet concerning for chordal rupture. Patient was therefore referred to our office for urgent evaluation and management.  ? ?Patient establish care with our office with Dr. Einar Gip 02/01/2021.  Dr. Einar Gip had personally reviewed echocardiogram and felt he is in acute decompensated diastolic heart failure with severe MR, flail mitral leaflet cannot be excluded.  Patient subsequently underwent TEE and left/right heart catheterization, details below.  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 Einar Gip sent referral to [facility] cardiothoracic surgery for consideration of mitral valve repair/replacement.  Patient underwent mitral valve repair at [facility] by Dr. Cheree Ditto on 04/29/2021.  ? ?Patient now presents for follow up with our office.  Patient is feeling well overall since discharge from [facility] following surgery.  He has been slowly increasing physical activity, presently walking laps outside his house which are about 90 feet, most recently did 7 laps without issue.  Patient reports he is being very careful with sodium intake and following heart healthy diet.  He is aware of need for dental prophylaxis as well as no dental work until June 2023.  ? ?His minimal bilateral leg edema. Reports significant improvement of dyspnea on exertion and fatigue following surgery. ? ?Past  Medical History:  ?Diagnosis Date  ? Bilateral inguinal hernia (BIH) s/p lap repair 09/06/2012 08/09/2012  ? Cataract   ? CMC arthritis, thumb, degenerative   ? right  ? COPD (chronic obstructive pulmonary disease) ([place])   ? Emphysema of lung ([place])   ? Ex-smoker   ? GERD (gastroesophageal reflux disease)   ? History of chicken pox   ? History of pneumonia 08/06/2009  ?     ? Nasal septal deviation 04/14/2017  ? Marked  ? Numbness   ? R cheek stays numb  ? Obesity (BMI 30-39.9) 08/09/2012  ? ?Past Surgical History:  ?Procedure Laterality Date  ? ANKLE SURGERY Left 1966  ? with tendon repair with stainless steel wire  ? COLONOSCOPY  07/2017  ? multiple tubular adenomas, rpt 3 yrs ([place])  ? INSERTION OF MESH Bilateral 09/06/2012  ? Procedure: INSERTION OF MESH;  Surgeon: Adin Hector, MD  ? LAPAROSCOPIC INGUINAL HERNIA WITH UMBILICAL HERNIA Bilateral 09/06/2012  ? Procedure: LAPAROSCOPIC exploration and repair of hernias in bellybutton and bilateral groins with mesh;  Surgeon: Adin Hector, MD  ? MICRODISCECTOMY LUMBAR Right 08/2020  ? L4/5 (Dr Zada Finders)  ? RIGHT/LEFT HEART CATH AND CORONARY ANGIOGRAPHY N/A 02/16/2021  ? Procedure: RIGHT/LEFT HEART CATH AND CORONARY ANGIOGRAPHY;  Surgeon: Adrian Prows, MD;  Location: [place] [facility];  Service: Cardiovascular;  Laterality: N/A;  ? TEE WITHOUT CARDIOVERSION N/A 02/12/2021  ? Procedure: TRANSESOPHAGEAL ECHOCARDIOGRAM (TEE);  Surgeon: Adrian Prows, MD;  Location: [place];  Service: Cardiovascular;  Laterality: N/A;  ? ?Family History  ?Problem Relation Age of Onset  ? Cancer Mother   ?  breast  ? Congenital heart disease Mother   ? Diabetes Mother   ? COPD Father   ? Cancer Sister   ?     lung (minimal smoker)  ? Colon cancer Neg Hx   ? Esophageal cancer Neg Hx   ? Liver cancer Neg Hx   ? Rectal cancer Neg Hx   ? Stomach cancer Neg Hx   ? Pancreatic cancer Neg Hx   ?  ?Social History  ? ?Tobacco Use  ? Smoking status: Former  ?  Packs/day: 2.00  ?  Years: 40.00  ?   Pack years: 80.00  ?  Types: Cigarettes  ?  Start date: 03/14/1968  ?  Quit date: 03/14/2006  ?  Years since quitting: 15.2  ? Smokeless tobacco: Never  ?Substance Use Topics  ? Alcohol use: Yes  ?  Comment: Occasionally  ? ?Marital Status: Married  ? ?ROS  ?Review of Systems  ?Cardiovascular:  Positive for dyspnea on exertion (improved). Negative for chest pain, leg swelling, orthopnea and paroxysmal nocturnal dyspnea.  ?Musculoskeletal:  Positive for back pain.  ?Gastrointestinal:  Negative for melena.  ? ?Objective  ?Blood pressure 125/78, pulse 97, temperature 98.1 ?F (36.7 ?C), temperature source Temporal, resp. rate 17, height '5\' 8"'$  (1.727 m), weight 202 lb 12.8 oz (92 kg), SpO2 98 %.  ?Vitals with BMI 05/31/2021 03/16/2021 03/16/2021  ?Height '5\' 8"'$  - -  ?Weight 202 lbs 13 oz - -  ?BMI 30.84 - -  ?Systolic 660 630 160  ?Diastolic 78 77 84  ?Pulse 97 65 63  ?  ? ?Physical Exam ?Vitals reviewed.  ?Constitutional:   ?   Appearance: He is obese.  ?Neck:  ?   Vascular: Carotid bruit (right) present. No JVD.  ?Cardiovascular:  ?   Rate and Rhythm: Normal rate and regular rhythm.  ?   Pulses: Intact distal pulses.  ?   Heart sounds:  ?  No gallop.  ?   Comments: Thoracotomy incision well healed , clean, dry and intact , without drainage and without erythema ?Previously noted MR murmur not well appreciated following repair.  ?Pulmonary:  ?   Effort: Pulmonary effort is normal.  ?   Breath sounds: No rales.  ?Musculoskeletal:  ?   Right lower leg: Edema (trace) present.  ?   Left lower leg: Edema (trace) present.  ? ?Laboratory examination:  ? ?Recent Labs  ?  01/25/21 ?1300 02/08/21 ?0908 02/16/21 ?1133 02/16/21 ?1142 02/16/21 ?1147 03/15/21 ?2158  ?NA 143 136   < > 139 138 134*  ?K 3.3* 4.4   < > 4.6 4.6 4.0  ?CL 106 98  --   --   --  103  ?CO2 26 23  --   --   --  22  ?GLUCOSE 101* 112*  --   --   --  166*  ?BUN 20 25  --   --   --  27*  ?CREATININE 0.91 1.24  --   --   --  1.23  ?CALCIUM 9.2 9.9  --   --   --  9.4   ?GFRNONAA  --   --   --   --   --  >60  ? < > = values in this interval not displayed.  ? ?CrCl cannot be calculated (Patient's most recent lab result is older than the maximum 21 days allowed.).  ?CMP Latest Ref Rng & Units 03/15/2021 02/16/2021 02/16/2021  ?Glucose 70 - 99 mg/dL 166(H) - -  ?  BUN 8 - 23 mg/dL 27(H) - -  ?Creatinine 0.61 - 1.24 mg/dL 1.23 - -  ?Sodium 135 - 145 mmol/L 134(L) 138 139  ?Potassium 3.5 - 5.1 mmol/L 4.0 4.6 4.6  ?Chloride 98 - 111 mmol/L 103 - -  ?CO2 22 - 32 mmol/L 22 - -  ?Calcium 8.9 - 10.3 mg/dL 9.4 - -  ?Total Protein 6.0 - 8.3 g/dL - - -  ?Total Bilirubin 0.2 - 1.2 mg/dL - - -  ?Alkaline Phos 39 - 117 U/L - - -  ?AST 0 - 37 U/L - - -  ?ALT 0 - 53 U/L - - -  ? ?CBC Latest Ref Rng & Units 03/15/2021 02/16/2021 02/16/2021  ?WBC 4.0 - 10.5 K/uL 15.2(H) - -  ?Hemoglobin 13.0 - 17.0 g/dL 16.3 16.3 16.7  ?Hematocrit 39.0 - 52.0 % 46.7 48.0 49.0  ?Platelets 150 - 400 K/uL 207 - -  ? ? ?Lipid Panel ?No results for input(s): CHOL, TRIG, LDLCALC, VLDL, HDL, CHOLHDL, LDLDIRECT in the last 8760 hours. ? ? ?HEMOGLOBIN A1C ?No results found for: HGBA1C, MPG ?TSH ?Recent Labs  ?  01/25/21 ?1300  ?TSH 3.91  ? ? ?External labs:  ?None  ? ?Allergies  ?No Known Allergies  ? ?Medications Prior to Visit:  ? ?Outpatient Medications Prior to Visit  ?Medication Sig Dispense Refill  ? Ascorbic Acid 500 MG CHEW Chew 1 tablet by mouth daily.    ? aspirin 81 MG chewable tablet Chew by mouth.    ? atorvastatin (LIPITOR) 20 MG tablet Take 1 tablet by mouth daily.    ? b complex vitamins tablet Take 1 tablet by mouth daily.    ? DOCOSAHEXAENOIC ACID PO Take 120 mg by mouth daily.    ? furosemide (LASIX) 40 MG tablet TAKE 1 TABLET (40 MG TOTAL) BY MOUTH 2 (TWO) TIMES DAILY (IN THE MORNING AND AT 3PM) 180 tablet 1  ? Omega-3 Fatty Acids (FISH OIL) 1000 MG CAPS Take 1 capsule (1,000 mg total) by mouth daily.    ? Omeprazole-Sodium Bicarbonate (ZEGERID OTC) 20-1100 MG CAPS capsule Take 1 capsule by mouth daily before  breakfast. (Patient taking differently: Take 1 capsule by mouth daily as needed (Heartburn).) 28 each 0  ? atorvastatin (LIPITOR) 20 MG tablet TAKE 1 TABLET BY MOUTH EVERY DAY 90 tablet 1  ? losartan (COZAAR) 50 MG table

## 2021-07-03 ENCOUNTER — Other Ambulatory Visit: Payer: Self-pay | Admitting: Family Medicine

## 2021-07-03 DIAGNOSIS — I10 Essential (primary) hypertension: Secondary | ICD-10-CM

## 2021-07-03 DIAGNOSIS — E785 Hyperlipidemia, unspecified: Secondary | ICD-10-CM

## 2021-07-03 DIAGNOSIS — Z125 Encounter for screening for malignant neoplasm of prostate: Secondary | ICD-10-CM

## 2021-07-06 DIAGNOSIS — D485 Neoplasm of uncertain behavior of skin: Secondary | ICD-10-CM | POA: Insufficient documentation

## 2021-07-06 DIAGNOSIS — L719 Rosacea, unspecified: Secondary | ICD-10-CM | POA: Insufficient documentation

## 2021-07-06 DIAGNOSIS — M5126 Other intervertebral disc displacement, lumbar region: Secondary | ICD-10-CM | POA: Insufficient documentation

## 2021-07-06 DIAGNOSIS — D0471 Carcinoma in situ of skin of right lower limb, including hip: Secondary | ICD-10-CM | POA: Insufficient documentation

## 2021-07-06 DIAGNOSIS — M5416 Radiculopathy, lumbar region: Secondary | ICD-10-CM | POA: Insufficient documentation

## 2021-07-06 DIAGNOSIS — D099 Carcinoma in situ, unspecified: Secondary | ICD-10-CM | POA: Insufficient documentation

## 2021-07-07 ENCOUNTER — Ambulatory Visit (INDEPENDENT_AMBULATORY_CARE_PROVIDER_SITE_OTHER): Payer: Medicare Other

## 2021-07-07 ENCOUNTER — Other Ambulatory Visit (INDEPENDENT_AMBULATORY_CARE_PROVIDER_SITE_OTHER): Payer: Medicare Other

## 2021-07-07 VITALS — Ht 68.0 in | Wt 202.0 lb

## 2021-07-07 DIAGNOSIS — E785 Hyperlipidemia, unspecified: Secondary | ICD-10-CM | POA: Diagnosis not present

## 2021-07-07 DIAGNOSIS — Z125 Encounter for screening for malignant neoplasm of prostate: Secondary | ICD-10-CM

## 2021-07-07 DIAGNOSIS — Z0000 Encounter for general adult medical examination without abnormal findings: Secondary | ICD-10-CM

## 2021-07-07 DIAGNOSIS — I10 Essential (primary) hypertension: Secondary | ICD-10-CM

## 2021-07-07 LAB — COMPREHENSIVE METABOLIC PANEL
ALT: 15 U/L (ref 0–53)
AST: 20 U/L (ref 0–37)
Albumin: 3.9 g/dL (ref 3.5–5.2)
Alkaline Phosphatase: 87 U/L (ref 39–117)
BUN: 19 mg/dL (ref 6–23)
CO2: 27 mEq/L (ref 19–32)
Calcium: 9 mg/dL (ref 8.4–10.5)
Chloride: 107 mEq/L (ref 96–112)
Creatinine, Ser: 1.03 mg/dL (ref 0.40–1.50)
GFR: 71.66 mL/min (ref >60.00)
Glucose, Bld: 97 mg/dL (ref 70–99)
Potassium: 4.5 mEq/L (ref 3.5–5.1)
Sodium: 141 mEq/L (ref 135–145)
Total Bilirubin: 0.3 mg/dL (ref 0.2–1.2)
Total Protein: 6.8 g/dL (ref 6.0–8.3)

## 2021-07-07 LAB — LIPID PANEL
Cholesterol: 94 mg/dL (ref 0–200)
HDL: 35.1 mg/dL — ABNORMAL LOW (ref >39.00)
LDL Cholesterol: 37 mg/dL (ref 0–99)
NonHDL: 59.37
Total CHOL/HDL Ratio: 3
Triglycerides: 114 mg/dL (ref 0.0–149.0)
VLDL: 22.8 mg/dL (ref 0.0–40.0)

## 2021-07-07 LAB — PSA, MEDICARE: PSA: 1.37 ng/ml (ref 0.10–4.00)

## 2021-07-07 LAB — MICROALBUMIN / CREATININE URINE RATIO
Creatinine,U: 145.4 mg/dL
Microalb Creat Ratio: 1.9 mg/g (ref 0.0–30.0)
Microalb, Ur: 2.8 mg/dL — ABNORMAL HIGH (ref 0.0–1.9)

## 2021-07-07 NOTE — Progress Notes (Signed)
? ?Subjective:  ? Ryan Mathews is a 76 y.o. male who presents for Medicare Annual/Subsequent preventive examination. ?Virtual Visit via Telephone Note ? ?I connected with  [name] on 07/07/21 at 10:30 AM EDT by telephone and verified that I am speaking with the correct person using two identifiers. ? ?Location: ?Patient: HOME ?Provider: [facility] ?Persons participating in the virtual visit: patient/Nurse Health Advisor ?  ?I discussed the limitations, risks, security and privacy concerns of performing an evaluation and management service by telephone and the availability of in person appointments. The patient expressed understanding and agreed to proceed. ? ?Interactive audio and video telecommunications were attempted between this nurse and patient, however failed, due to patient having technical difficulties OR patient did not have access to video capability.  We continued and completed visit with audio only. ? ?Some vital signs may be absent or patient reported.  ? ?Chriss Driver, LPN ? ?Review of Systems    ? ?Cardiac Risk Factors include: advanced age (>13mn, >>75women);hypertension;male gender;sedentary lifestyle;obesity (BMI >30kg/m2);Other (see comment), Risk factor comments: CHF ? ?   ?Objective:  ?  ?Today's Vitals  ? 07/07/21 1026  ?Weight: 202 lb (91.6 kg)  ?Height: '5\' 8"'$  (1.727 m)  ? ?Body mass index is 30.71 kg/m?. ? ? ?  07/07/2021  ? 10:35 AM 03/15/2021  ?  9:54 PM 02/16/2021  ?  8:57 AM 02/12/2021  ?  6:37 AM 05/08/2020  ?  1:59 PM 05/04/2018  ? 10:50 AM 07/21/2017  ? 10:11 AM  ?Advanced Directives  ?Does Patient Have a Medical Advance Directive? Yes No No No No No No  ?Type of AParamedicof ACullenLiving will        ?Does patient want to make changes to medical advance directive?     No - Patient declined    ?Copy of [place] Chart? No - copy requested        ?Would patient like information on creating a medical advance directive?   No  - Patient declined No - Patient declined  No - Patient declined No - Patient declined  ? ? ?Current Medications (verified) ?Outpatient Encounter Medications as of 07/07/2021  ?Medication Sig  ? Ascorbic Acid 500 MG CHEW Chew 1 tablet by mouth daily.  ? aspirin 81 MG chewable tablet Chew by mouth.  ? b complex vitamins tablet Take 1 tablet by mouth daily.  ? DOCOSAHEXAENOIC ACID PO Take 120 mg by mouth daily.  ? Omega-3 Fatty Acids (FISH OIL) 1000 MG CAPS Take 1 capsule (1,000 mg total) by mouth daily.  ? Omeprazole-Sodium Bicarbonate (ZEGERID OTC) 20-1100 MG CAPS capsule Take 1 capsule by mouth daily before breakfast. (Patient taking differently: Take 1 capsule by mouth daily as needed (Heartburn).)  ? atorvastatin (LIPITOR) 20 MG tablet Take 1 tablet by mouth daily. (Patient not taking: Reported on 07/07/2021)  ? furosemide (LASIX) 40 MG tablet TAKE 1 TABLET (40 MG TOTAL) BY MOUTH 2 (TWO) TIMES DAILY (IN THE MORNING AND AT 3PM) (Patient not taking: Reported on 07/07/2021)  ? ?No facility-administered encounter medications on file as of 07/07/2021.  ? ? ?Allergies (verified) ?Patient has no known allergies.  ? ?History: ?Past Medical History:  ?Diagnosis Date  ? Bilateral inguinal hernia (BIH) s/p lap repair 09/06/2012 08/09/2012  ? Cataract   ? CMC arthritis, thumb, degenerative   ? right  ? [place]   ? [place]   ?  Ex-smoker   ? GERD (gastroesophageal reflux disease)   ? History of chicken pox   ? History of pneumonia 08/06/2009  ?     ? Nasal septal deviation 04/14/2017  ? Marked  ? Numbness   ? R cheek stays numb  ? Obesity (BMI 30-39.9) 08/09/2012  ? ?Past Surgical History:  ?Procedure Laterality Date  ? ANKLE SURGERY Left 1966  ? with tendon repair with stainless steel wire  ? COLONOSCOPY  07/2017  ? multiple tubular adenomas, rpt 3 yrs ([place])  ? INSERTION OF MESH Bilateral 09/06/2012  ? Procedure: INSERTION OF MESH;  Surgeon: Adin Hector, MD  ?  LAPAROSCOPIC INGUINAL HERNIA WITH UMBILICAL HERNIA Bilateral 09/06/2012  ? Procedure: LAPAROSCOPIC exploration and repair of hernias in bellybutton and bilateral groins with mesh;  Surgeon: Adin Hector, MD  ? MICRODISCECTOMY LUMBAR Right 08/2020  ? L4/5 (Dr Zada Finders)  ? RIGHT/LEFT HEART CATH AND CORONARY ANGIOGRAPHY N/A 02/16/2021  ? Procedure: RIGHT/LEFT HEART CATH AND CORONARY ANGIOGRAPHY;  Surgeon: Adrian Prows, MD;  Location: [place] [facility];  Service: Cardiovascular;  Laterality: N/A;  ? TEE WITHOUT CARDIOVERSION N/A 02/12/2021  ? Procedure: TRANSESOPHAGEAL ECHOCARDIOGRAM (TEE);  Surgeon: Adrian Prows, MD;  Location: [place];  Service: Cardiovascular;  Laterality: N/A;  ? ?Family History  ?Problem Relation Age of Onset  ? Cancer Mother   ?     breast  ? Congenital heart disease Mother   ? Diabetes Mother   ? COPD Father   ? Cancer Sister   ?     lung (minimal smoker)  ? Colon cancer Neg Hx   ? Esophageal cancer Neg Hx   ? Liver cancer Neg Hx   ? Rectal cancer Neg Hx   ? Stomach cancer Neg Hx   ? Pancreatic cancer Neg Hx   ? ?Social History  ? ?Socioeconomic History  ? Marital status: Married  ?  Spouse name: Not on file  ? Number of children: 4  ? Years of education: Not on file  ? Highest education level: Not on file  ?Occupational History  ? Not on file  ?Tobacco Use  ? Smoking status: Former  ?  Packs/day: 2.00  ?  Years: 40.00  ?  Pack years: 80.00  ?  Types: Cigarettes  ?  Start date: 03/14/1968  ?  Quit date: 03/14/2006  ?  Years since quitting: 15.3  ? Smokeless tobacco: Never  ?Vaping Use  ? Vaping Use: Never used  ?Substance and Sexual Activity  ? Alcohol use: Yes  ?  Comment: Occasionally  ? Drug use: No  ? Sexual activity: Yes  ?Other Topics Concern  ? Not on file  ?Social History Narrative  ? Caffeine: 2 coffee, 2 tea/day  ? Lives with wife and 4 cats and 1 dog, grown children  ? Occupation: Solicitor  ? Edu: HS  ? Activity: stays active at work and in garden  ? Diet: some water,  fruits/vegetables daily  ? ?Social Determinants of Health  ? ?Financial Resource Strain: Low Risk   ? Difficulty of Paying Living Expenses: Not hard at all  ?Food Insecurity: No Food Insecurity  ? Worried About Charity fundraiser in the Last Year: Never true  ? Ran Out of Food in the Last Year: Never true  ?Transportation Needs: No Transportation Needs  ? Lack of Transportation (Medical): No  ? Lack of Transportation (Non-Medical): No  ?Physical Activity: Sufficiently Active  ? Days of Exercise per Week: 5  days  ? Minutes of Exercise per Session: 30 min  ?Stress: No Stress Concern Present  ? Feeling of Stress : Not at all  ?Social Connections: Unknown  ? Frequency of Communication with Friends and Family: More than three times a week  ? Frequency of Social Gatherings with Friends and Family: More than three times a week  ? Attends Religious Services: Not on file  ? Active Member of Clubs or Organizations: No  ? Attends Archivist Meetings: Never  ? Marital Status: Married  ? ? ?Tobacco Counseling ?Counseling given: Not Answered ? ? ?Clinical Intake: ? ?Pre-visit preparation completed: Yes ? ?Pain : No/denies pain ? ?  ? ?BMI - recorded: 30.71 ?Nutritional Status: BMI > 30  Obese ?Nutritional Risks: None ?Diabetes: No ? ?How often do you need to have someone help you when you read instructions, pamphlets, or other written materials from your doctor or pharmacy?: 1 - Never ? ?Diabetic?No ? ?Interpreter Needed?: No ? ?Information entered by :: mj Shandreka Dante, lpn ? ? ?Activities of Daily Living ? ?  07/07/2021  ? 10:37 AM  ?In your present state of health, do you have any difficulty performing the following activities:  ?Hearing? 0  ?Vision? 0  ?Difficulty concentrating or making decisions? 1  ?Comment Memory at times.  ?Walking or climbing stairs? 0  ?Dressing or bathing? 0  ?Doing errands, shopping? 0  ?Preparing Food and eating ? N  ?Using the Toilet? N  ?In the past six months, have you accidently leaked  urine? N  ?Do you have problems with loss of bowel control? N  ?Managing your Medications? N  ?Managing your Finances? N  ?Housekeeping or managing your Housekeeping? N  ? ? ?Patient Care Team: ?Ria Bush, MD as PCP

## 2021-07-07 NOTE — Patient Instructions (Signed)
Ryan Mathews , ?Thank you for taking time to come for your Medicare Wellness Visit. I appreciate your ongoing commitment to your health goals. Please review the following plan we discussed and let me know if I can assist you in the future.  ? ?Screening recommendations/referrals: ?Colonoscopy: Deferred x 6 months per Cardiologist advise. ? ?Recommended yearly ophthalmology/optometry visit for glaucoma screening and checkup ?Recommended yearly dental visit for hygiene and checkup ? ?Vaccinations: ?Influenza vaccine: Done 12/18/2020 Repeat annually ? ?Pneumococcal vaccine: Done 10/15/2015 and 04/21/2017 ?Tdap vaccine: Due Repeat in 10 years ? ?Shingles vaccine: Discussed.   ?Covid-19: Done 01/08/2021, 12/16/2019, 04/26/2019 and 04/05/2019. ? ?Advanced directives: Please bring a copy of your health care power of attorney and living will to the office to be added to your chart at your convenience. ? ? ?Conditions/risks identified: Aim for 30 minutes of exercise or brisk walking, 6-8 glasses of water, and 5 servings of fruits and vegetables each day. ?KEEP UP THE GOOD WORK!! ? ?Next appointment: Follow up in one year for your annual wellness visit. 07/11/2022 @ 10:30 AM. ? ?Preventive Care 39 Years and Older, Male ? ?Preventive care refers to lifestyle choices and visits with your health care provider that can promote health and wellness. ?What does preventive care include? ?A yearly physical exam. This is also called an annual well check. ?Dental exams once or twice a year. ?Routine eye exams. Ask your health care provider how often you should have your eyes checked. ?Personal lifestyle choices, including: ?Daily care of your teeth and gums. ?Regular physical activity. ?Eating a healthy diet. ?Avoiding tobacco and drug use. ?Limiting alcohol use. ?Practicing safe sex. ?Taking low doses of aspirin every day. ?Taking vitamin and mineral supplements as recommended by your health care provider. ?What happens during an annual well  check? ?The services and screenings done by your health care provider during your annual well check will depend on your age, overall health, lifestyle risk factors, and family history of disease. ?Counseling  ?Your health care provider may ask you questions about your: ?Alcohol use. ?Tobacco use. ?Drug use. ?Emotional well-being. ?Home and relationship well-being. ?Sexual activity. ?Eating habits. ?History of falls. ?Memory and ability to understand (cognition). ?Work and work Statistician. ?Screening  ?You may have the following tests or measurements: ?Height, weight, and BMI. ?Blood pressure. ?Lipid and cholesterol levels. These may be checked every 5 years, or more frequently if you are over 61 years old. ?Skin check. ?Lung cancer screening. You may have this screening every year starting at age 59 if you have a 30-pack-year history of smoking and currently smoke or have quit within the past 15 years. ?Fecal occult blood test (FOBT) of the stool. You may have this test every year starting at age 50. ?Flexible sigmoidoscopy or colonoscopy. You may have a sigmoidoscopy every 5 years or a colonoscopy every 10 years starting at age 50. ?Prostate cancer screening. Recommendations will vary depending on your family history and other risks. ?Hepatitis C blood test. ?Hepatitis B blood test. ?Sexually transmitted disease (STD) testing. ?Diabetes screening. This is done by checking your blood sugar (glucose) after you have not eaten for a while (fasting). You may have this done every 1-3 years. ?Abdominal aortic aneurysm (AAA) screening. You may need this if you are a current or former smoker. ?Osteoporosis. You may be screened starting at age 56 if you are at high risk. ?Talk with your health care provider about your test results, treatment options, and if necessary, the need for more tests. ?  Vaccines  ?Your health care provider may recommend certain vaccines, such as: ?Influenza vaccine. This is recommended every  year. ?Tetanus, diphtheria, and acellular pertussis (Tdap, Td) vaccine. You may need a Td booster every 10 years. ?Zoster vaccine. You may need this after age 73. ?Pneumococcal 13-valent conjugate (PCV13) vaccine. One dose is recommended after age 68. ?Pneumococcal polysaccharide (PPSV23) vaccine. One dose is recommended after age 13. ?Talk to your health care provider about which screenings and vaccines you need and how often you need them. ?This information is not intended to replace advice given to you by your health care provider. Make sure you discuss any questions you have with your health care provider. ?Document Released: 03/27/2015 Document Revised: 11/18/2015 Document Reviewed: 12/30/2014 ?Elsevier Interactive Patient Education ? 2017 [place]. ? ?Fall Prevention in the Home ?Falls can cause injuries. They can happen to people of all ages. There are many things you can do to make your home safe and to help prevent falls. ?What can I do on the outside of my home? ?Regularly fix the edges of walkways and driveways and fix any cracks. ?Remove anything that might make you trip as you walk through a door, such as a raised step or threshold. ?Trim any bushes or trees on the path to your home. ?Use bright outdoor lighting. ?Clear any walking paths of anything that might make someone trip, such as rocks or tools. ?Regularly check to see if handrails are loose or broken. Make sure that both sides of any steps have handrails. ?Any raised decks and porches should have guardrails on the edges. ?Have any leaves, snow, or ice cleared regularly. ?Use sand or salt on walking paths during winter. ?Clean up any spills in your garage right away. This includes oil or grease spills. ?What can I do in the bathroom? ?Use night lights. ?Install grab bars by the toilet and in the tub and shower. Do not use towel bars as grab bars. ?Use non-skid mats or decals in the tub or shower. ?If you need to sit down in the shower, use a  plastic, non-slip stool. ?Keep the floor dry. Clean up any water that spills on the floor as soon as it happens. ?Remove soap buildup in the tub or shower regularly. ?Attach bath mats securely with double-sided non-slip rug tape. ?Do not have throw rugs and other things on the floor that can make you trip. ?What can I do in the bedroom? ?Use night lights. ?Make sure that you have a light by your bed that is easy to reach. ?Do not use any sheets or blankets that are too big for your bed. They should not hang down onto the floor. ?Have a firm chair that has side arms. You can use this for support while you get dressed. ?Do not have throw rugs and other things on the floor that can make you trip. ?What can I do in the kitchen? ?Clean up any spills right away. ?Avoid walking on wet floors. ?Keep items that you use a lot in easy-to-reach places. ?If you need to reach something above you, use a strong step stool that has a grab bar. ?Keep electrical cords out of the way. ?Do not use floor polish or wax that makes floors slippery. If you must use wax, use non-skid floor wax. ?Do not have throw rugs and other things on the floor that can make you trip. ?What can I do with my stairs? ?Do not leave any items on the stairs. ?Make sure that  there are handrails on both sides of the stairs and use them. Fix handrails that are broken or loose. Make sure that handrails are as long as the stairways. ?Check any carpeting to make sure that it is firmly attached to the stairs. Fix any carpet that is loose or worn. ?Avoid having throw rugs at the top or bottom of the stairs. If you do have throw rugs, attach them to the floor with carpet tape. ?Make sure that you have a light switch at the top of the stairs and the bottom of the stairs. If you do not have them, ask someone to add them for you. ?What else can I do to help prevent falls? ?Wear shoes that: ?Do not have high heels. ?Have rubber bottoms. ?Are comfortable and fit you  well. ?Are closed at the toe. Do not wear sandals. ?If you use a stepladder: ?Make sure that it is fully opened. Do not climb a closed stepladder. ?Make sure that both sides of the stepladder are locked into place. ?A

## 2021-07-09 DIAGNOSIS — Z961 Presence of intraocular lens: Secondary | ICD-10-CM | POA: Diagnosis not present

## 2021-07-14 ENCOUNTER — Telehealth (HOSPITAL_COMMUNITY): Payer: Self-pay

## 2021-07-14 ENCOUNTER — Encounter: Payer: Self-pay | Admitting: Family Medicine

## 2021-07-14 ENCOUNTER — Ambulatory Visit (INDEPENDENT_AMBULATORY_CARE_PROVIDER_SITE_OTHER): Payer: Medicare Other | Admitting: Family Medicine

## 2021-07-14 VITALS — BP 150/90 | HR 85 | Temp 97.8°F | Ht 68.0 in | Wt 201.0 lb

## 2021-07-14 DIAGNOSIS — Z87891 Personal history of nicotine dependence: Secondary | ICD-10-CM | POA: Diagnosis not present

## 2021-07-14 DIAGNOSIS — Z9889 Other specified postprocedural states: Secondary | ICD-10-CM | POA: Diagnosis not present

## 2021-07-14 DIAGNOSIS — Z7189 Other specified counseling: Secondary | ICD-10-CM

## 2021-07-14 DIAGNOSIS — K219 Gastro-esophageal reflux disease without esophagitis: Secondary | ICD-10-CM

## 2021-07-14 DIAGNOSIS — E785 Hyperlipidemia, unspecified: Secondary | ICD-10-CM | POA: Diagnosis not present

## 2021-07-14 DIAGNOSIS — I10 Essential (primary) hypertension: Secondary | ICD-10-CM | POA: Diagnosis not present

## 2021-07-14 DIAGNOSIS — E669 Obesity, unspecified: Secondary | ICD-10-CM

## 2021-07-14 DIAGNOSIS — J439 Emphysema, unspecified: Secondary | ICD-10-CM

## 2021-07-14 DIAGNOSIS — I700 Atherosclerosis of aorta: Secondary | ICD-10-CM | POA: Diagnosis not present

## 2021-07-14 NOTE — Assessment & Plan Note (Addendum)
Chronic, stable on fish oil and atorvastatin '20mg'$  daily.  ?The ASCVD Risk score (Arnett DK, et al., 2019) failed to calculate for the following reasons: ?  The valid total cholesterol range is 130 to 320 mg/dL  ?

## 2021-07-14 NOTE — Telephone Encounter (Signed)
Called patient to see if he was interested in participating in the Cardiac Rehab Program. Patient stated yes. Patient will come in for orientation on 07/15/21 @ 1:15PM and will attend the 7AM (W&F) exercise class. Went over insurance, patient verbalized understanding.  ?  ?Tourist information centre manager. ?

## 2021-07-14 NOTE — Assessment & Plan Note (Signed)
Continue aspirin, statin.  

## 2021-07-14 NOTE — Assessment & Plan Note (Addendum)
Continues yearly lung cancer screening with CT scan.  ?

## 2021-07-14 NOTE — Assessment & Plan Note (Addendum)
Appreciate cards and surgeon care.  ?Planning to start cardiac rehab next week.  ?

## 2021-07-14 NOTE — Assessment & Plan Note (Signed)
Encouraged healthy diet and lifestyle choices to affect sustainable weight loss.  ?

## 2021-07-14 NOTE — Telephone Encounter (Signed)
Pt insurance is active and benefits verified through Medicare A/B. Co-pay $0.00, DED $226.00/$226.00 met, out of pocket $0.00/$0.00 met, co-insurance 20%. No pre-authorization required. Passport, 07/14/21 @ 2:04PM, DKE#[id number] ?  ?2ndary insurance is active and benefits verified through [place]. Co-pay $0.00, DED $0.00/$0.00 met, out of pocket $0.00/$0.00 met, co-insurance 0%. No pre-authorization required.  ?  ?Will contact patient to see if he is interested in the Cardiac Rehab Program. ?

## 2021-07-14 NOTE — Patient Instructions (Signed)
You are doing well today ?Bring [place] a copy of your living will. ?Blood pressure was elevated today - continue monitoring at home and call us or cardiology with a log in the next week.  ?You may need to start BP medicine if staying elevated at home.  ?Continue drinking plenty of water, limit added salt/sodium in the diet.  ?Return as needed or in 1 year for next physical.  ? ?Health Maintenance After Age 74 ?After age 82, you are at a higher risk for certain long-term diseases and infections as well as injuries from falls. Falls are a major cause of broken bones and head injuries in people who are older than age 24. Getting regular preventive care can help to keep you healthy and well. Preventive care includes getting regular testing and making lifestyle changes as recommended by your health care provider. Talk with your health care provider about: ?Which screenings and tests you should have. A screening is a test that checks for a disease when you have no symptoms. ?A diet and exercise plan that is right for you. ?What should I know about screenings and tests to prevent falls? ?Screening and testing are the best ways to find a health problem early. Early diagnosis and treatment give you the best chance of managing medical conditions that are common after age 53. Certain conditions and lifestyle choices may make you more likely to have a fall. Your health care provider may recommend: ?Regular vision checks. Poor vision and conditions such as cataracts can make you more likely to have a fall. If you wear glasses, make sure to get your prescription updated if your vision changes. ?Medicine review. Work with your health care provider to regularly review all of the medicines you are taking, including over-the-counter medicines. Ask your health care provider about any side effects that may make you more likely to have a fall. Tell your health care provider if any medicines that you take make you feel dizzy or  sleepy. ?Strength and balance checks. Your health care provider may recommend certain tests to check your strength and balance while standing, walking, or changing positions. ?Foot health exam. Foot pain and numbness, as well as not wearing proper footwear, can make you more likely to have a fall. ?Screenings, including: ?Osteoporosis screening. Osteoporosis is a condition that causes the bones to get weaker and break more easily. ?Blood pressure screening. Blood pressure changes and medicines to control blood pressure can make you feel dizzy. ?Depression screening. You may be more likely to have a fall if you have a fear of falling, feel depressed, or feel unable to do activities that you used to do. ?Alcohol use screening. Using too much alcohol can affect your balance and may make you more likely to have a fall. ?Follow these instructions at home: ?Lifestyle ?Do not drink alcohol if: ?Your health care provider tells you not to drink. ?If you drink alcohol: ?Limit how much you have to: ?0-1 drink a day for women. ?0-2 drinks a day for men. ?Know how much alcohol is in your drink. In the [place] one drink equals one 12 oz bottle of beer (355 mL), one 5 oz glass of wine (148 mL), or one 1? oz glass of hard liquor (44 mL). ?Do not use any products that contain nicotine or tobacco. These products include cigarettes, chewing tobacco, and vaping devices, such as e-cigarettes. If you need help quitting, ask your health care provider. ?Activity ? ?Follow a regular exercise program to stay fit.  This will help you maintain your balance. Ask your health care provider what types of exercise are appropriate for you. ?If you need a cane or walker, use it as recommended by your health care provider. ?Wear supportive shoes that have nonskid soles. ?Safety ? ?Remove any tripping hazards, such as rugs, cords, and clutter. ?Install safety equipment such as grab bars in bathrooms and safety rails on stairs. ?Keep rooms and walkways  well-lit. ?General instructions ?Talk with your health care provider about your risks for falling. Tell your health care provider if: ?You fall. Be sure to tell your health care provider about all falls, even ones that seem minor. ?You feel dizzy, tiredness (fatigue), or off-balance. ?Take over-the-counter and prescription medicines only as told by your health care provider. These include supplements. ?Eat a healthy diet and maintain a healthy weight. A healthy diet includes low-fat dairy products, low-fat (lean) meats, and fiber from whole grains, beans, and lots of fruits and vegetables. ?Stay current with your vaccines. ?Schedule regular health, dental, and eye exams. ?Summary ?Having a healthy lifestyle and getting preventive care can help to protect your health and wellness after age 53. ?Screening and testing are the best way to find a health problem early and help you avoid having a fall. Early diagnosis and treatment give you the best chance for managing medical conditions that are more common for people who are older than age 19. ?Falls are a major cause of broken bones and head injuries in people who are older than age 36. Take precautions to prevent a fall at home. ?Work with your health care provider to learn what changes you can make to improve your health and wellness and to prevent falls. ?This information is not intended to replace advice given to you by your health care provider. Make sure you discuss any questions you have with your health care provider. ?Document Revised: 07/20/2020 Document Reviewed: 07/20/2020 ?Elsevier Patient Education ? [place]. ? ?

## 2021-07-14 NOTE — Assessment & Plan Note (Signed)
BP elevated, currently off antihypertensive.  ?He states home readings predominantly run 037Q systolic. ?Reviewed dietary choices to control hypertension.  ?He will start monitoring regularly and keep BP log and call us or cardiology in 1 wk with readings.  ?He did recently restart working which may contribute to increased BP due to increased stress.  ?

## 2021-07-14 NOTE — Assessment & Plan Note (Signed)
asxs from respiratory standpoint.  ?

## 2021-07-14 NOTE — Assessment & Plan Note (Addendum)
Advanced directive discussion: has this at home. Would like wife to be HCPOA. Doesn't want prolonged life support. Asked to bring me a copy.  ?

## 2021-07-14 NOTE — Assessment & Plan Note (Signed)
Stable period on zegerid PRN.  ?

## 2021-07-14 NOTE — Progress Notes (Signed)
? ? Patient ID: Ryan Mathews, male    DOB: [birth year], 76 y.o.   MRN: [id number] ? ?This visit was conducted in person. ? ?BP (!) 150/90   Pulse 85   Temp 97.8 ?F (36.6 ?C) (Temporal)   Ht '5\' 8"'$  (1.727 m)   Wt 201 lb (91.2 kg)   SpO2 97%   BMI 30.56 kg/m?   ?BP Readings from Last 3 Encounters:  ?07/14/21 (!) 150/90  ?05/31/21 125/78  ?03/16/21 (!) 152/77  ?160/90 on retesting ? ?CC: AMW ?Subjective:  ? ?HPI: ?Ryan Mathews is a 76 y.o. male presenting on 07/14/2021 for Medicare Wellness ? ? ?Saw health advisor last month for medicare wellness visit. Note reviewed.   ? ?Hearing Screening  ? '250Hz'$  '500Hz'$  '1000Hz'$  '2000Hz'$  '4000Hz'$   ?Right ear 25 0 40 40 0  ?Left ear 25 25 40 40 0  ?Vision Screening - Comments:: Pt's last eye exam in April 2023 at [facility].   ?Flowsheet Row Clinical Support from 07/07/2021 in [place] at [facility]  ?PHQ-2 Total Score 0  ? ?  ?  ? ?  07/07/2021  ? 10:37 AM 05/08/2020  ?  2:00 PM 05/10/2019  ?  2:14 PM 05/04/2018  ? 10:50 AM 04/21/2017  ? 10:36 AM  ?Fall Risk   ?Falls in the past year? 0 0 0 0 No  ?Number falls in past yr: 0 0     ?Injury with Fall? 0 0     ?Risk for fall due to : No Fall Risks No Fall Risks     ?Follow up Falls prevention discussed Falls evaluation completed;Falls prevention discussed     ? ?Fully retired 02/2021.  ? ?[facility] valve [facility] 17[facility] Sim[facility]. Followed locally by Dr GEinar Gip Has been recommended dental ppx. Planing to start cardiac rehab next week.  ? ?BP elevated today - he's been off BP meds recently.  ?He's limiting salt/sodium. Drinking water regularly.  ? ?S/p ruptured disc 08/2020.  ? ?Preventative: ?COLONOSCOPY 07/2017 multiple tubular adenomas, rpt 3 yrs ([facility]) - postponing for 6 months after recent heart surgery.  ?Prostate cancer screen - PSA reassuring. DRE reassuring 2019, 2021.  ?Lung cancer screening - ex smoker quit 2008. Yearly screens. ?Flu  shot yearly  ?[place]/2021, 04/2019, booster 12/2019, bivalent 12/2020 ?Tetanus - unsure  ?Pneumovax 2013, prevnar-13 10/2015, pneumovax 04/2017 ?zostavax 2013 ?shingrix - discussed, declines. Wife had bad reaction to this.  ?Advanced directive discussion: has this at home. Would like wife to be HCPOA. Doesn't want prolonged life support. Asked to bring me a copy.  ?Seat belt use discussed.  ?Sunscreen use discussed. No changing moles on skin. Sees derm.  ?Ex smoker - quit 2008. >30 PY hx ?Alcohol - seldom  ?Dentist - due ?Eye exam yearly ([facility] - s/p cataract surgery spring 2022  ?Bowel - no constipation ?Bladder - no incontinence ?  ?Caffeine: 2 coffee, 2 tea/day ?Lives with wife and 4 cats and 1 dog, grown children ?Occupation: [employer] ?Edu: HS ?Activity: stays active at work and in garden  ?Diet: poor water intake, fruits/vegetables daily  ?   ? ?Relevant past medical, surgical, family and social history reviewed and updated as indicated. Interim medical history since our last visit reviewed. ?Allergies and medications reviewed and updated. ?Outpatient Medications Prior to Visit  ?Medication Sig Dispense Refill  ? Ascorbic Acid 500 MG CHEW Chew 1 tablet by  mouth daily.    ? aspirin 81 MG chewable tablet Chew by mouth.    ? atorvastatin (LIPITOR) 20 MG tablet Take 1 tablet by mouth daily.    ? b complex vitamins tablet Take 1 tablet by mouth daily.    ? Omega-3 Fatty Acids (FISH OIL) 1000 MG CAPS Take 1 capsule (1,000 mg total) by mouth daily.    ? Omeprazole-Sodium Bicarbonate (ZEGERID OTC) 20-1100 MG CAPS capsule Take 1 capsule by mouth daily before breakfast. (Patient taking differently: Take 1 capsule by mouth daily as needed (Heartburn).) 28 each 0  ? DOCOSAHEXAENOIC ACID PO Take 120 mg by mouth daily.    ? furosemide (LASIX) 40 MG tablet TAKE 1 TABLET (40 MG TOTAL) BY MOUTH 2 (TWO) TIMES DAILY (IN THE MORNING AND AT 3PM) (Patient not taking: Reported on 07/07/2021) 180  tablet 1  ? ?No facility-administered medications prior to visit.  ?  ? ?Per HPI unless specifically indicated in ROS section below ?Review of Systems ? ?Objective:  ?BP (!) 150/90   Pulse 85   Temp 97.8 ?F (36.6 ?C) (Temporal)   Ht '5\' 8"'$  (1.727 m)   Wt 201 lb (91.2 kg)   SpO2 97%   BMI 30.56 kg/m?   ?Wt Readings from Last 3 Encounters:  ?07/14/21 201 lb (91.2 kg)  ?07/07/21 202 lb (91.6 kg)  ?05/31/21 202 lb 12.8 oz (92 kg)  ?  ?  ?Physical Exam ?Vitals and nursing note reviewed.  ?Constitutional:   ?   General: He is not in acute distress. ?   Appearance: Normal appearance. He is well-developed. He is not ill-appearing.  ?HENT:  ?   Head: Normocephalic and atraumatic.  ?   Right Ear: Hearing, tympanic membrane, ear canal and external ear normal.  ?   Left Ear: Hearing, tympanic membrane, ear canal and external ear normal.  ?Eyes:  ?   General: No scleral icterus. ?   Extraocular Movements: Extraocular movements intact.  ?   Conjunctiva/sclera: Conjunctivae normal.  ?   Pupils: Pupils are equal, round, and reactive to light.  ?Neck:  ?   Thyroid: No thyroid mass or thyromegaly.  ?   Vascular: No carotid bruit.  ?Cardiovascular:  ?   Rate and Rhythm: Normal rate and regular rhythm.  ?   Pulses: Normal pulses.     ?     Radial pulses are 2+ on the right side and 2+ on the left side.  ?   Heart sounds: Normal heart sounds. No murmur heard. ?Pulmonary:  ?   Effort: Pulmonary effort is normal. No respiratory distress.  ?   Breath sounds: Normal breath sounds. No wheezing, rhonchi or rales.  ?Abdominal:  ?   General: Bowel sounds are normal. There is no distension.  ?   Palpations: Abdomen is soft. There is no mass.  ?   Tenderness: There is no abdominal tenderness. There is no guarding or rebound.  ?   Hernia: No hernia is present.  ?Musculoskeletal:     ?   General: Normal range of motion.  ?   Cervical back: Normal range of motion and neck supple.  ?   Right lower leg: No edema.  ?   Left lower leg: No edema.   ?Lymphadenopathy:  ?   Cervical: No cervical adenopathy.  ?Skin: ?   General: Skin is warm and dry.  ?   Findings: No rash.  ?Neurological:  ?   General: No focal deficit present.  ?  Mental Status: He is alert and oriented to person, place, and time.  ?Psychiatric:     ?   Mood and Affect: Mood normal.     ?   Behavior: Behavior normal.     ?   Thought Content: Thought content normal.     ?   Judgment: Judgment normal.  ? ?   ?Results for orders placed or performed in visit on 07/07/21  ?PSA, Medicare  ?Result Value Ref Range  ? PSA 1.37 0.10 - 4.00 ng/ml  ?Microalbumin / creatinine urine ratio  ?Result Value Ref Range  ? Microalb, Ur 2.8 (H) 0.0 - 1.9 mg/dL  ? Creatinine,U 145.4 mg/dL  ? Microalb Creat Ratio 1.9 0.0 - 30.0 mg/g  ?Comprehensive metabolic panel  ?Result Value Ref Range  ? Sodium 141 135 - 145 mEq/L  ? Potassium 4.5 3.5 - 5.1 mEq/L  ? Chloride 107 96 - 112 mEq/L  ? CO2 27 19 - 32 mEq/L  ? Glucose, Bld 97 70 - 99 mg/dL  ? BUN 19 6 - 23 mg/dL  ? Creatinine, Ser 1.03 0.40 - 1.50 mg/dL  ? Total Bilirubin 0.3 0.2 - 1.2 mg/dL  ? Alkaline Phosphatase 87 39 - 117 U/L  ? AST 20 0 - 37 U/L  ? ALT 15 0 - 53 U/L  ? Total Protein 6.8 6.0 - 8.3 g/dL  ? Albumin 3.9 3.5 - 5.2 g/dL  ? GFR 71.66 >60.00 mL/min  ? Calcium 9.0 8.4 - 10.5 mg/dL  ?Lipid panel  ?Result Value Ref Range  ? Cholesterol 94 0 - 200 mg/dL  ? Triglycerides 114.0 0.0 - 149.0 mg/dL  ? HDL 35.10 (L) >39.00 mg/dL  ? VLDL 22.8 0.0 - 40.0 mg/dL  ? LDL Cholesterol 37 0 - 99 mg/dL  ? Total CHOL/HDL Ratio 3   ? NonHDL 59.37   ? ? ?Assessment & Plan:  ? ?Problem List Items Addressed This Visit   ? ? Advanced care planning/counseling discussion (Chronic)  ?  Advanced directive discussion: has this at home. Would like wife to be HCPOA. Doesn't want prolonged life support. Asked to bring me a copy.  ?  ?  ? Bullous emphysema ([place])  ?  asxs from respiratory standpoint.  ? ?  ?  ? Obesity, Class I, BMI 30-34.9  ?  Encouraged healthy diet and lifestyle choices  to affect sustainable weight loss.  ? ?  ?  ? Ex-smoker  ?  Continues yearly lung cancer screening with CT scan.  ? ?  ?  ? GERD (gastroesophageal reflux disease)  ?  Stable period on zegerid PRN.  ? ?  ?  ? Dyslipidem

## 2021-07-15 ENCOUNTER — Encounter (HOSPITAL_COMMUNITY)
Admission: RE | Admit: 2021-07-15 | Discharge: 2021-07-15 | Disposition: A | Discharge status: Home / Self Care | Payer: Medicare Other | Source: Ambulatory Visit | Attending: Cardiology | Admitting: Cardiology

## 2021-07-15 ENCOUNTER — Encounter (HOSPITAL_COMMUNITY): Payer: Self-pay

## 2021-07-15 ENCOUNTER — Telehealth (HOSPITAL_COMMUNITY): Payer: Self-pay | Admitting: *Deleted

## 2021-07-15 VITALS — BP 144/70 | HR 86 | Ht 68.0 in | Wt 206.8 lb

## 2021-07-15 DIAGNOSIS — Z4889 Encounter for other specified surgical aftercare: Secondary | ICD-10-CM | POA: Insufficient documentation

## 2021-07-15 DIAGNOSIS — Z9889 Other specified postprocedural states: Secondary | ICD-10-CM | POA: Insufficient documentation

## 2021-07-15 HISTORY — DX: Essential (primary) hypertension: I10

## 2021-07-15 HISTORY — DX: Hyperlipidemia, unspecified: E78.5

## 2021-07-15 NOTE — Progress Notes (Signed)
Cardiac Rehab Medication Review by a Nurse ? ?Does the patient  feel that his/her medications are working for him/her?  yes ? ?Has the patient been experiencing any side effects to the medications prescribed?  no ? ?Does the patient measure his/her own blood pressure or blood glucose at home?  yes  ? ?Does the patient have any problems obtaining medications due to transportation or finances?   no ? ?Understanding of regimen: good ?Understanding of indications: good ?Potential of compliance: good ? ? ? ?Nurse comments: Ryan Mathews is taking his medications as prescribed and has a good understanding of what his medicatons are for. Tommy checks his blood pressures on a regular basis at home. ? ? ? ?Harrell Gave RN ?07/15/2021 1:33 PM ?  ?

## 2021-07-15 NOTE — Telephone Encounter (Signed)
Confirmed appointment. Completed health history.Barnet Pall, RN,BSN ?07/15/2021 8:29 AM  ?

## 2021-07-15 NOTE — Progress Notes (Signed)
Cardiac Individual Treatment Plan ? ?Patient Details  ?Name: Ryan Mathews ?MRN: [id number] ?Date of Birth: [birth year] ?Referring Provider:   ?Flowsheet Row CARDIAC REHAB PHASE II ORIENTATION from 07/15/2021 in [place]  ?Referring Provider Dr. Adrian Prows, MD  ? ?  ? ? ?Initial Encounter Date:  ?Flowsheet Row CARDIAC REHAB PHASE II ORIENTATION from 07/15/2021 in [place]  ?Date 07/15/21  ? ?  ? ? ?Visit Diagnosis: 04/29/21 S/P MVR at [facility] Dr Cheree Ditto ? ?Patient's Home Medications on Admission: ? ?Current Outpatient Medications:  ?  Ascorbic Acid 500 MG CHEW, Chew 1 tablet by mouth daily., Disp: , Rfl:  ?  aspirin 81 MG chewable tablet, Chew by mouth., Disp: , Rfl:  ?  atorvastatin (LIPITOR) 20 MG tablet, Take 1 tablet by mouth daily., Disp: , Rfl:  ?  b complex vitamins tablet, Take 1 tablet by mouth daily., Disp: , Rfl:  ?  Omega-3 Fatty Acids (FISH OIL) 1000 MG CAPS, Take 1 capsule (1,000 mg total) by mouth daily., Disp: , Rfl:  ?  Omeprazole-Sodium Bicarbonate (ZEGERID OTC) 20-1100 MG CAPS capsule, Take 1 capsule by mouth daily before breakfast. (Patient taking differently: Take 1 capsule by mouth daily as needed (Heartburn).), Disp: 28 each, Rfl: 0 ? ?Past Medical History: ?Past Medical History:  ?Diagnosis Date  ? Bilateral inguinal hernia (BIH) s/p lap repair 09/06/2012 08/09/2012  ? Cataract   ? CMC arthritis, thumb, degenerative   ? right  ? COPD (chronic obstructive pulmonary disease) ([place])   ? Emphysema of lung ([place])   ? Ex-smoker   ? GERD (gastroesophageal reflux disease)   ? History of chicken pox   ? History of pneumonia 08/06/2009  ?     ? Hyperlipidemia   ? Hypertension   ? Nasal septal deviation 04/14/2017  ? Marked  ? Numbness   ? R cheek stays numb  ? Obesity (BMI 30-39.9) 08/09/2012  ? ? ?Tobacco Use: ?Social History  ? ?Tobacco Use  ?Smoking Status Former  ? Packs/day: 2.00  ? Years: 40.00  ? Pack years: 80.00  ? Types: Cigarettes  ?  Start date: 03/14/1968  ? Quit date: 03/14/2006  ? Years since quitting: 15.3  ?Smokeless Tobacco Never  ? ? ?Labs: ?Review Flowsheet   ? ?  ?  Latest Ref Rng & Units 05/04/2018 05/17/2019 05/06/2020 02/16/2021  ?Labs for ITP Cardiac and Pulmonary Rehab  ?Cholestrol 0 - 200 mg/dL 132   134   137     ?LDL (calc) 0 - 99 mg/dL 67   68   77     ?HDL-C >39.00 mg/dL 35.10   34.00   35.10     ?Trlycerides 0.0 - 149.0 mg/dL 147.0   157.0   124.0     ?PH, Arterial 7.350 - 7.450    7.330    ?PCO2 arterial 32.0 - 48.0 mmHg    41.5    ?Bicarbonate 20.0 - 28.0 mmol/L    21.9    ? 22.4    ? 21.8    ? 22.7    ?TCO2 22 - 32 mmol/L    23    ? 24    ? 23    ? 24    ?Acid-base deficit 0.0 - 2.0 mmol/L    4.0    ? 4.0    ? 4.0    ? 3.0    ?O2 Saturation %    100.0    ?  70.0    ? 70.0    ? 69.0    ? ?  07/07/2021  ?Labs for ITP Cardiac and Pulmonary Rehab  ?Cholestrol 94    ?LDL (calc) 37    ?HDL-C 35.10    ?Trlycerides 114.0    ?PH, Arterial   ?PCO2 arterial   ?Bicarbonate   ?TCO2   ?Acid-base deficit   ?O2 Saturation   ?  ? ? Multiple values from one day are sorted in reverse-chronological order  ?  ?  ? ? ?Capillary Blood Glucose: ?No results found for: GLUCAP ? ? ?Exercise Target Goals: ?Exercise Program Goal: ?Individual exercise prescription set using results from initial 6 min walk test and THRR while considering  patient?s activity barriers and safety.  ? ?Exercise Prescription Goal: ?Starting with aerobic activity 30 plus minutes a day, 3 days per week for initial exercise prescription. Provide home exercise prescription and guidelines that participant acknowledges understanding prior to discharge. ? ?Activity Barriers & Risk Stratification: ? Activity Barriers & Cardiac Risk Stratification - 07/15/21 1454   ? ?  ? Activity Barriers & Cardiac Risk Stratification  ? Activity Barriers Back Problems;Joint Problems;Balance Concerns;Deconditioning   ? Cardiac Risk Stratification High   ? ?  ?  ? ?  ? ? ?6 Minute Walk: ? 6 Minute Walk   ? ?  [place] Name 07/15/21 1451  ?  ?  ?  ? 6 Minute Walk  ? Phase Initial    ? Distance 1278 feet    ? Walk Time 6 minutes    ? # of Rest Breaks 0    ? MPH 2.42    ? METS 2.49    ? RPE 11    ? Perceived Dyspnea  0    ? VO2 Peak 8.73    ? Symptoms No    ? Resting HR 86 bpm    ? Resting BP 144/70    ? Resting Oxygen Saturation  95 %    ? Exercise Oxygen Saturation  during 6 min walk 94 %    ? Max Ex. HR 100 bpm    ? Max Ex. BP 140/70    ? 2 Minute Post BP 142/76    ? ?  ?  ? ?  ? ? ?Oxygen Initial Assessment: ? ? ?Oxygen Re-Evaluation: ? ? ?Oxygen Discharge (Final Oxygen Re-Evaluation): ? ? ?Initial Exercise Prescription: ? Initial Exercise Prescription - 07/15/21 1400   ? ?  ? Date of Initial Exercise RX and Referring Provider  ? Date 07/15/21   ? Referring Provider Dr. Adrian Prows, MD   ? Expected Discharge Date 09/10/21   ?  ? NuStep  ? Level 2   ? SPM 80   ? Minutes 15   ? METs 1.9   ?  ? Track  ? Laps 12   ? Minutes 15   ? METs 2.39   ?  ? Prescription Details  ? Frequency (times per week) 3   ? Duration Progress to 30 minutes of continuous aerobic without signs/symptoms of physical distress   ?  ? Intensity  ? THRR 40-80% of Max Heartrate 60-117   ? Ratings of Perceived Exertion 11-13   ? Perceived Dyspnea 0-4   ?  ? Progression  ? Progression Continue progressive overload as per policy without signs/symptoms or physical distress.   ?  ? Resistance Training  ? Training Prescription Yes   ? Weight 3   ? Reps 10-15   ? ?  ?  ? ?  ? ? ?  Perform Capillary Blood Glucose checks as needed. ? ?Exercise Prescription Changes: ? ? ?Exercise Comments: ? ? ?Exercise Goals and Review: ? ? Exercise Goals   ? ? [place] Name 07/15/21 1457  ?  ?  ?  ?  ?  ? Exercise Goals  ? Increase Physical Activity Yes      ? Intervention Provide advice, education, support and counseling about physical activity/exercise needs.;Develop an individualized exercise prescription for aerobic and resistive training based on initial evaluation findings, risk  stratification, comorbidities and participant's personal goals.      ? Expected Outcomes Short Term: Attend rehab on a regular basis to increase amount of physical activity.;Long Term: Add in home exercise to make exercise part of routine and to increase amount of physical activity.;Long Term: Exercising regularly at least 3-5 days a week.      ? Increase Strength and Stamina Yes      ? Intervention Provide advice, education, support and counseling about physical activity/exercise needs.;Develop an individualized exercise prescription for aerobic and resistive training based on initial evaluation findings, risk stratification, comorbidities and participant's personal goals.      ? Expected Outcomes Short Term: Increase workloads from initial exercise prescription for resistance, speed, and METs.;Short Term: Perform resistance training exercises routinely during rehab and add in resistance training at home;Long Term: Improve cardiorespiratory fitness, muscular endurance and strength as measured by increased METs and functional capacity (6MWT)      ? Able to understand and use rate of perceived exertion (RPE) scale Yes      ? Intervention Provide education and explanation on how to use RPE scale      ? Expected Outcomes Short Term: Able to use RPE daily in rehab to express subjective intensity level;Long Term:  Able to use RPE to guide intensity level when exercising independently      ? Knowledge and understanding of Target Heart Rate Range (THRR) Yes      ? Intervention Provide education and explanation of THRR including how the numbers were predicted and where they are located for reference      ? Expected Outcomes Short Term: Able to state/look up THRR;Long Term: Able to use THRR to govern intensity when exercising independently;Short Term: Able to use daily as guideline for intensity in rehab      ? Understanding of Exercise Prescription Yes      ? Intervention Provide education, explanation, and written  materials on patient's individual exercise prescription      ? Expected Outcomes Short Term: Able to explain program exercise prescription;Long Term: Able to explain home exercise prescription to exercise independently

## 2021-07-21 ENCOUNTER — Encounter (HOSPITAL_COMMUNITY)
Admission: RE | Admit: 2021-07-21 | Discharge: 2021-07-21 | Disposition: A | Discharge status: Home / Self Care | Payer: Medicare Other | Source: Ambulatory Visit | Attending: Cardiology | Admitting: Cardiology

## 2021-07-21 DIAGNOSIS — Z9889 Other specified postprocedural states: Secondary | ICD-10-CM | POA: Diagnosis not present

## 2021-07-21 DIAGNOSIS — Z4889 Encounter for other specified surgical aftercare: Secondary | ICD-10-CM | POA: Diagnosis not present

## 2021-07-21 NOTE — Progress Notes (Signed)
Cardiac Individual Treatment Plan ? ?Patient Details  ?Name: Ryan Mathews ?MRN: [id number] ?Date of Birth: [birth year] ?Referring Provider:   ?Flowsheet Row CARDIAC REHAB PHASE II ORIENTATION from 07/15/2021 in [place]  ?Referring Provider Dr. Adrian Prows, MD  ? ?  ? ? ?Initial Encounter Date:  ?Flowsheet Row CARDIAC REHAB PHASE II ORIENTATION from 07/15/2021 in [place]  ?Date 07/15/21  ? ?  ? ? ?Visit Diagnosis: 04/29/21 S/P MVR at [facility] Dr Cheree Ditto ? ?Patient's Home Medications on Admission: ? ?Current Outpatient Medications:  ?  Ascorbic Acid 500 MG CHEW, Chew 1 tablet by mouth daily., Disp: , Rfl:  ?  aspirin 81 MG chewable tablet, Chew by mouth., Disp: , Rfl:  ?  atorvastatin (LIPITOR) 20 MG tablet, Take 1 tablet by mouth daily., Disp: , Rfl:  ?  b complex vitamins tablet, Take 1 tablet by mouth daily., Disp: , Rfl:  ?  Omega-3 Fatty Acids (FISH OIL) 1000 MG CAPS, Take 1 capsule (1,000 mg total) by mouth daily., Disp: , Rfl:  ?  Omeprazole-Sodium Bicarbonate (ZEGERID OTC) 20-1100 MG CAPS capsule, Take 1 capsule by mouth daily before breakfast. (Patient taking differently: Take 1 capsule by mouth daily as needed (Heartburn).), Disp: 28 each, Rfl: 0 ? ?Past Medical History: ?Past Medical History:  ?Diagnosis Date  ? Bilateral inguinal hernia (BIH) s/p lap repair 09/06/2012 08/09/2012  ? Cataract   ? CMC arthritis, thumb, degenerative   ? right  ? COPD (chronic obstructive pulmonary disease) ([place])   ? Emphysema of lung ([place])   ? Ex-smoker   ? GERD (gastroesophageal reflux disease)   ? History of chicken pox   ? History of pneumonia 08/06/2009  ?     ? Hyperlipidemia   ? Hypertension   ? Nasal septal deviation 04/14/2017  ? Marked  ? Numbness   ? R cheek stays numb  ? Obesity (BMI 30-39.9) 08/09/2012  ? ? ?Tobacco Use: ?Social History  ? ?Tobacco Use  ?Smoking Status Former  ? Packs/day: 2.00  ? Years: 40.00  ? Pack years: 80.00  ? Types: Cigarettes  ?  Start date: 03/14/1968  ? Quit date: 03/14/2006  ? Years since quitting: 15.3  ?Smokeless Tobacco Never  ? ? ?Labs: ?Review Flowsheet   ? ?  ?  Latest Ref Rng & Units 05/04/2018 05/17/2019 05/06/2020 02/16/2021  ?Labs for ITP Cardiac and Pulmonary Rehab  ?Cholestrol 0 - 200 mg/dL 132   134   137     ?LDL (calc) 0 - 99 mg/dL 67   68   77     ?HDL-C >39.00 mg/dL 35.10   34.00   35.10     ?Trlycerides 0.0 - 149.0 mg/dL 147.0   157.0   124.0     ?PH, Arterial 7.350 - 7.450    7.330    ?PCO2 arterial 32.0 - 48.0 mmHg    41.5    ?Bicarbonate 20.0 - 28.0 mmol/L    21.9    ? 22.4    ? 21.8    ? 22.7    ?TCO2 22 - 32 mmol/L    23    ? 24    ? 23    ? 24    ?Acid-base deficit 0.0 - 2.0 mmol/L    4.0    ? 4.0    ? 4.0    ? 3.0    ?O2 Saturation %    100.0    ?  70.0    ? 70.0    ? 69.0    ? ?  07/07/2021  ?Labs for ITP Cardiac and Pulmonary Rehab  ?Cholestrol 94    ?LDL (calc) 37    ?HDL-C 35.10    ?Trlycerides 114.0    ?PH, Arterial   ?PCO2 arterial   ?Bicarbonate   ?TCO2   ?Acid-base deficit   ?O2 Saturation   ?  ? ? Multiple values from one day are sorted in reverse-chronological order  ?  ?  ? ? ?Capillary Blood Glucose: ?No results found for: GLUCAP ? ? ?Exercise Target Goals: ?Exercise Program Goal: ?Individual exercise prescription set using results from initial 6 min walk test and THRR while considering  patient?s activity barriers and safety.  ? ?Exercise Prescription Goal: ?Initial exercise prescription builds to 30-45 minutes a day of aerobic activity, 2-3 days per week.  Home exercise guidelines will be given to patient during program as part of exercise prescription that the participant will acknowledge. ? ?Activity Barriers & Risk Stratification: ? Activity Barriers & Cardiac Risk Stratification - 07/15/21 1454   ? ?  ? Activity Barriers & Cardiac Risk Stratification  ? Activity Barriers Back Problems;Joint Problems;Balance Concerns;Deconditioning   ? Cardiac Risk Stratification High   ? ?  ?  ? ?  ? ? ?6 Minute Walk: ? 6  Minute Walk   ? ? [place] Name 07/15/21 1451  ?  ?  ?  ? 6 Minute Walk  ? Phase Initial    ? Distance 1278 feet    ? Walk Time 6 minutes    ? # of Rest Breaks 0    ? MPH 2.42    ? METS 2.49    ? RPE 11    ? Perceived Dyspnea  0    ? VO2 Peak 8.73    ? Symptoms No    ? Resting HR 86 bpm    ? Resting BP 144/70    ? Resting Oxygen Saturation  95 %    ? Exercise Oxygen Saturation  during 6 min walk 94 %    ? Max Ex. HR 100 bpm    ? Max Ex. BP 140/70    ? 2 Minute Post BP 142/76    ? ?  ?  ? ?  ? ? ?Oxygen Initial Assessment: ? ? ?Oxygen Re-Evaluation: ? ? ?Oxygen Discharge (Final Oxygen Re-Evaluation): ? ? ?Initial Exercise Prescription: ? Initial Exercise Prescription - 07/15/21 1400   ? ?  ? Date of Initial Exercise RX and Referring Provider  ? Date 07/15/21   ? Referring Provider Dr. Adrian Prows, MD   ? Expected Discharge Date 09/10/21   ?  ? NuStep  ? Level 2   ? SPM 80   ? Minutes 15   ? METs 1.9   ?  ? Track  ? Laps 12   ? Minutes 15   ? METs 2.39   ?  ? Prescription Details  ? Frequency (times per week) 3   ? Duration Progress to 30 minutes of continuous aerobic without signs/symptoms of physical distress   ?  ? Intensity  ? THRR 40-80% of Max Heartrate 60-117   ? Ratings of Perceived Exertion 11-13   ? Perceived Dyspnea 0-4   ?  ? Progression  ? Progression Continue progressive overload as per policy without signs/symptoms or physical distress.   ?  ? Resistance Training  ? Training Prescription Yes   ? Weight 3   ?  Reps 10-15   ? ?  ?  ? ?  ? ? ?Perform Capillary Blood Glucose checks as needed. ? ?Exercise Prescription Changes: ? ? Exercise Prescription Changes   ? ? [place] Name 07/21/21 0955  ?  ?  ?  ?  ?  ? Response to Exercise  ? Blood Pressure (Admit) 140/78      ? Blood Pressure (Exercise) 142/76      ? Blood Pressure (Exit) 120/64      ? Heart Rate (Admit) 90 bpm      ? Heart Rate (Exercise) 100 bpm      ? Heart Rate (Exit) 90 bpm      ? Rating of Perceived Exertion (Exercise) 11      ? Symptoms none      ?  Comments Pt first day of exercise in the CRP2 program      ? Duration Progress to 30 minutes of  aerobic without signs/symptoms of physical distress      ? Intensity THRR unchanged      ?  ? Progression  ? Progression Continue to progress workloads to maintain intensity without signs/symptoms of physical distress.      ? Average METs 2.17      ?  ? Resistance Training  ? Training Prescription Yes      ? Weight 3      ? Reps 10-15      ? Time 10 Minutes      ?  ? Interval Training  ? Interval Training No      ?  ? NuStep  ? Level 2      ? SPM 80      ? Minutes 15      ? METs 1.6      ?  ? Track  ? Laps 15      ? Minutes 15      ? METs 2.74      ? ?  ?  ? ?  ? ? ?Exercise Comments: ? ? Exercise Comments   ? ? [place] Name 07/21/21 1003  ?  ?  ?  ?  ? Exercise Comments Pt frist day in the CRP2 program. Pt tolerated exercise well with an average MET level of 2.17. Pt is learning his THRR, RPE and ExRx. Pt is off to a good start      ? ?  ?  ? ?  ? ? ?Exercise Goals and Review: ? ? Exercise Goals   ? ? [place] Name 07/15/21 1457  ?  ?  ?  ?  ?  ? Exercise Goals  ? Increase Physical Activity Yes      ? Intervention Provide advice, education, support and counseling about physical activity/exercise needs.;Develop an individualized exercise prescription for aerobic and resistive training based on initial evaluation findings, risk stratification, comorbidities and participant's personal goals.      ? Expected Outcomes Short Term: Attend rehab on a regular basis to increase amount of physical activity.;Long Term: Add in home exercise to make exercise part of routine and to increase amount of physical activity.;Long Term: Exercising regularly at least 3-5 days a week.      ? Increase Strength and Stamina Yes      ? Intervention Provide advice, education, support and counseling about physical activity/exercise needs.;Develop an individualized exercise prescription for aerobic and resistive training based on initial evaluation findings,  risk stratification, comorbidities and participant's personal goals.      ? Expected  Outcomes Short Term: Increase workloads from initial exercise prescription for resistance, speed, and METs.;Short Term: Perf

## 2021-07-21 NOTE — Progress Notes (Signed)
QUALITY OF LIFE SCORE REVIEW ? Pt completed Quality of Life survey as a participant in Cardiac Rehab.  Scores 19.0 or below are considered low.  Pt scored the following:  Overall 23.8, Health and Function 24.00, socioeconomic 22.9, physiological and spiritual 22.50, family 26.80. No Psychosocial needs identified. Will continue to monitor for any changes and intervene as necessary.  Cherre Huger, BSN ?Cardiac and Pulmonary Rehab Nurse Navigator  ? ? ?

## 2021-07-21 NOTE — Progress Notes (Signed)
Daily Session Note ? ?Patient Details  ?Name: Ryan Mathews ?MRN: [id number] ?Date of Birth: 02-Oct-1945 ?Referring Provider:   ?Flowsheet Row CARDIAC REHAB PHASE II ORIENTATION from 07/15/2021 in [place]  ?Referring Provider Dr. Adrian Prows, MD  ? ?  ? ? ?Encounter Date: 07/21/2021 ? ?Check In: ? Session Check In - 07/21/21 0718   ? ?  ? Check-In  ? Supervising physician immediately available to respond to emergencies [facility] immediately available   ? Physician(s) Dr. Sloan Leiter   ? Location [facility]   ? Staff Present Esmeralda Links BS, ACSM EP-C, Exercise Physiologist;Olinty Celesta Aver, MS, ACSM CEP, Exercise Physiologist;Kaylee Rosana Hoes, MS, ACSM-CEP, Exercise Physiologist;Dede Dobesh Wilber Oliphant, Therapist, sports, BSN   ? Virtual Visit No   ? Medication changes reported     No   ? Fall or balance concerns reported    No   ? Tobacco Cessation No Change   ? Current number of cigarettes/nicotine per day     0   ? Warm-up and Cool-down Performed as group-led instruction   ? Resistance Training Performed No   ? VAD Patient? No   ? PAD/SET Patient? No   ?  ? Pain Assessment  ? Currently in Pain? No/denies   ? Pain Score 0-No pain   ? Multiple Pain Sites No   ? ?  ?  ? ?  ? ? ?Capillary Blood Glucose: ?No results found for this or any previous visit (from the past 24 hour(s)). ? ? Exercise Prescription Changes - 07/21/21 0955   ? ?  ? Response to Exercise  ? Blood Pressure (Admit) 140/78   ? Blood Pressure (Exercise) 142/76   ? Blood Pressure (Exit) 120/64   ? Heart Rate (Admit) 90 bpm   ? Heart Rate (Exercise) 100 bpm   ? Heart Rate (Exit) 90 bpm   ? Rating of Perceived Exertion (Exercise) 11   ? Symptoms none   ? Comments Pt first day of exercise in the CRP2 program   ? Duration Progress to 30 minutes of  aerobic without signs/symptoms of physical distress   ? Intensity THRR unchanged   ?  ? Progression  ? Progression Continue to progress workloads to maintain intensity without  signs/symptoms of physical distress.   ? Average METs 2.17   ?  ? Resistance Training  ? Training Prescription Yes   ? Weight 3   ? Reps 10-15   ? Time 10 Minutes   ?  ? Interval Training  ? Interval Training No   ?  ? NuStep  ? Level 2   ? SPM 80   ? Minutes 15   ? METs 1.6   ?  ? Track  ? Laps 15   ? Minutes 15   ? METs 2.74   ? ?  ?  ? ?  ? ? ?Social History  ? ?Tobacco Use  ?Smoking Status Former  ? Packs/day: 2.00  ? Years: 40.00  ? Pack years: 80.00  ? Types: Cigarettes  ? Start date: 03/14/1968  ? Quit date: 03/14/2006  ? Years since quitting: 15.3  ?Smokeless Tobacco Never  ? ? ?Goals Met:  ?Exercise tolerated well ?No report of concerns or symptoms today ? ?Goals Unmet:  ?  ? ?Comments: Pt started cardiac rehab today.  Pt tolerated light exercise without difficulty. VSS, telemetry-SR, asymptomatic.  Medication list reconciled. Pt denies barriers to medication compliance.  PSYCHOSOCIAL ASSESSMENT:  PHQ-0. Pt exhibits positive coping skills,  hopeful outlook with supportive family. No psychosocial needs identified at this time, no psychosocial interventions necessary.  Pt oriented to exercise equipment and routine.   Understanding verbalized. Cherre Huger, BSN ?Cardiac and Pulmonary Rehab Nurse Navigator  ? ? ? ? ?Dr. Fransico Him is Medical Director for Cardiac Rehab at [facility]. ?

## 2021-07-23 ENCOUNTER — Encounter (HOSPITAL_COMMUNITY)
Admission: RE | Admit: 2021-07-23 | Discharge: 2021-07-23 | Disposition: A | Discharge status: Home / Self Care | Payer: Medicare Other | Source: Ambulatory Visit | Attending: Cardiology | Admitting: Cardiology

## 2021-07-23 DIAGNOSIS — Z9889 Other specified postprocedural states: Secondary | ICD-10-CM | POA: Diagnosis not present

## 2021-07-23 DIAGNOSIS — Z4889 Encounter for other specified surgical aftercare: Secondary | ICD-10-CM | POA: Diagnosis not present

## 2021-07-28 ENCOUNTER — Encounter (HOSPITAL_COMMUNITY)
Admission: RE | Admit: 2021-07-28 | Discharge: 2021-07-28 | Disposition: A | Discharge status: Home / Self Care | Payer: Medicare Other | Source: Ambulatory Visit | Attending: Cardiology | Admitting: Cardiology

## 2021-07-28 DIAGNOSIS — Z9889 Other specified postprocedural states: Secondary | ICD-10-CM

## 2021-07-28 DIAGNOSIS — Z4889 Encounter for other specified surgical aftercare: Secondary | ICD-10-CM | POA: Diagnosis not present

## 2021-07-30 ENCOUNTER — Encounter (HOSPITAL_COMMUNITY)
Admission: RE | Admit: 2021-07-30 | Discharge: 2021-07-30 | Disposition: A | Discharge status: Home / Self Care | Payer: Medicare Other | Source: Ambulatory Visit | Attending: Cardiology | Admitting: Cardiology

## 2021-07-30 DIAGNOSIS — Z9889 Other specified postprocedural states: Secondary | ICD-10-CM | POA: Diagnosis not present

## 2021-07-30 DIAGNOSIS — Z4889 Encounter for other specified surgical aftercare: Secondary | ICD-10-CM | POA: Diagnosis not present

## 2021-08-04 ENCOUNTER — Encounter (HOSPITAL_COMMUNITY)
Admission: RE | Admit: 2021-08-04 | Discharge: 2021-08-04 | Disposition: A | Discharge status: Home / Self Care | Payer: Medicare Other | Source: Ambulatory Visit | Attending: Cardiology | Admitting: Cardiology

## 2021-08-04 DIAGNOSIS — Z9889 Other specified postprocedural states: Secondary | ICD-10-CM

## 2021-08-04 DIAGNOSIS — Z4889 Encounter for other specified surgical aftercare: Secondary | ICD-10-CM | POA: Diagnosis not present

## 2021-08-06 ENCOUNTER — Encounter (HOSPITAL_COMMUNITY)
Admission: RE | Admit: 2021-08-06 | Discharge: 2021-08-06 | Disposition: A | Discharge status: Home / Self Care | Payer: Medicare Other | Source: Ambulatory Visit | Attending: Cardiology | Admitting: Cardiology

## 2021-08-06 DIAGNOSIS — Z4889 Encounter for other specified surgical aftercare: Secondary | ICD-10-CM | POA: Diagnosis not present

## 2021-08-06 DIAGNOSIS — Z9889 Other specified postprocedural states: Secondary | ICD-10-CM | POA: Diagnosis not present

## 2021-08-11 ENCOUNTER — Encounter (HOSPITAL_COMMUNITY)
Admission: RE | Admit: 2021-08-11 | Discharge: 2021-08-11 | Disposition: A | Discharge status: Home / Self Care | Payer: Medicare Other | Source: Ambulatory Visit | Attending: Cardiology | Admitting: Cardiology

## 2021-08-11 DIAGNOSIS — Z9889 Other specified postprocedural states: Secondary | ICD-10-CM

## 2021-08-11 DIAGNOSIS — Z4889 Encounter for other specified surgical aftercare: Secondary | ICD-10-CM | POA: Diagnosis not present

## 2021-08-13 ENCOUNTER — Encounter (HOSPITAL_COMMUNITY)
Admission: RE | Admit: 2021-08-13 | Discharge: 2021-08-13 | Disposition: A | Discharge status: Home / Self Care | Payer: Medicare Other | Source: Ambulatory Visit | Attending: Cardiology | Admitting: Cardiology

## 2021-08-13 DIAGNOSIS — Z9889 Other specified postprocedural states: Secondary | ICD-10-CM | POA: Diagnosis not present

## 2021-08-18 ENCOUNTER — Encounter (HOSPITAL_COMMUNITY)
Admission: RE | Admit: 2021-08-18 | Discharge: 2021-08-18 | Disposition: A | Discharge status: Home / Self Care | Payer: Medicare Other | Source: Ambulatory Visit | Attending: Cardiology | Admitting: Cardiology

## 2021-08-18 DIAGNOSIS — Z9889 Other specified postprocedural states: Secondary | ICD-10-CM | POA: Diagnosis not present

## 2021-08-18 NOTE — Progress Notes (Signed)
Cardiac Individual Treatment Plan  Patient Details  Name: Ryan Mathews MRN: [id number] Date of Birth: [birth year] Referring Provider:   Flowsheet Row CARDIAC REHAB PHASE II ORIENTATION from 07/15/2021 in [place]  Referring Provider Dr. Adrian Prows, MD       Initial Encounter Date:  [facility] from 07/15/2021 in [place]  Date 07/15/21       Visit Diagnosis: 04/29/21 S/P MVR at [facility] Dr Cheree Ditto  Patient's Home Medications on Admission:  Current Outpatient Medications:    Ascorbic Acid 500 MG CHEW, Chew 1 tablet by mouth daily., Disp: , Rfl:    aspirin 81 MG chewable tablet, Chew by mouth., Disp: , Rfl:    atorvastatin (LIPITOR) 20 MG tablet, Take 1 tablet by mouth daily., Disp: , Rfl:    b complex vitamins tablet, Take 1 tablet by mouth daily., Disp: , Rfl:    Omega-3 Fatty Acids (FISH OIL) 1000 MG CAPS, Take 1 capsule (1,000 mg total) by mouth daily., Disp: , Rfl:    Omeprazole-Sodium Bicarbonate (ZEGERID OTC) 20-1100 MG CAPS capsule, Take 1 capsule by mouth daily before breakfast. (Patient taking differently: Take 1 capsule by mouth daily as needed (Heartburn).), Disp: 28 each, Rfl: 0  Past Medical History: Past Medical History:  Diagnosis Date   Bilateral inguinal hernia (BIH) s/p lap repair 09/06/2012 08/09/2012   Cataract    CMC arthritis, thumb, degenerative    right   COPD (chronic obstructive pulmonary disease) (HCC)    Emphysema of lung (HCC)    Ex-smoker    GERD (gastroesophageal reflux disease)    History of chicken pox    History of pneumonia 08/06/2009        Hyperlipidemia    Hypertension    Nasal septal deviation 04/14/2017   Marked   Numbness    R cheek stays numb   Obesity (BMI 30-39.9) 08/09/2012    Tobacco Use: Social History   Tobacco Use  Smoking Status Former   Packs/day: 2.00   Years: 40.00   Pack years: 80.00   Types: Cigarettes    Start date: 03/14/1968   Quit date: 03/14/2006   Years since quitting: 15.4  Smokeless Tobacco Never    Labs: Review Flowsheet        Latest Ref Rng & Units 05/04/2018 05/17/2019 05/06/2020 02/16/2021  Labs for ITP Cardiac and Pulmonary Rehab  Cholestrol 0 - 200 mg/dL 132   134   137     LDL (calc) 0 - 99 mg/dL 67   68   77     HDL-C >39.00 mg/dL 35.10   34.00   35.10     Trlycerides 0.0 - 149.0 mg/dL 147.0   157.0   124.0     PH, Arterial 7.350 - 7.450    7.330    PCO2 arterial 32.0 - 48.0 mmHg    41.5    Bicarbonate 20.0 - 28.0 mmol/L    21.9     22.4     21.8     22.7    TCO2 22 - 32 mmol/L    '23     24     23     24    ' Acid-base deficit 0.0 - 2.0 mmol/L    4.0     4.0     4.0     3.0    O2 Saturation %    100.0  70.0     70.0     69.0       07/07/2021  Labs for ITP Cardiac and Pulmonary Rehab  Cholestrol 94    LDL (calc) 37    HDL-C 35.10    Trlycerides 114.0    PH, Arterial   PCO2 arterial   Bicarbonate   TCO2   Acid-base deficit   O2 Saturation       Multiple values from one day are sorted in reverse-chronological order        Capillary Blood Glucose: No results found for: GLUCAP   Exercise Target Goals: Exercise Program Goal: Individual exercise prescription set using results from initial 6 min walk test and THRR while considering  patient's activity barriers and safety.   Exercise Prescription Goal: Initial exercise prescription builds to 30-45 minutes a day of aerobic activity, 2-3 days per week.  Home exercise guidelines will be given to patient during program as part of exercise prescription that the participant will acknowledge.  Activity Barriers & Risk Stratification:  Activity Barriers & Cardiac Risk Stratification - 07/15/21 1454       Activity Barriers & Cardiac Risk Stratification   Activity Barriers Back Problems;Joint Problems;Balance Concerns;Deconditioning    Cardiac Risk Stratification High             6 Minute Walk:  6  Minute Walk     Row Name 07/15/21 1451         6 Minute Walk   Phase Initial     Distance 1278 feet     Walk Time 6 minutes     # of Rest Breaks 0     MPH 2.42     METS 2.49     RPE 11     Perceived Dyspnea  0     VO2 Peak 8.73     Symptoms No     Resting HR 86 bpm     Resting BP 144/70     Resting Oxygen Saturation  95 %     Exercise Oxygen Saturation  during 6 min walk 94 %     Max Ex. HR 100 bpm     Max Ex. BP 140/70     2 Minute Post BP 142/76              Oxygen Initial Assessment:   Oxygen Re-Evaluation:   Oxygen Discharge (Final Oxygen Re-Evaluation):   Initial Exercise Prescription:  Initial Exercise Prescription - 07/15/21 1400       Date of Initial Exercise RX and Referring Provider   Date 07/15/21    Referring Provider Dr. Adrian Prows, MD    Expected Discharge Date 09/10/21      NuStep   Level 2    SPM 80    Minutes 15    METs 1.9      Track   Laps 12    Minutes 15    METs 2.39      Prescription Details   Frequency (times per week) 3    Duration Progress to 30 minutes of continuous aerobic without signs/symptoms of physical distress      Intensity   THRR 40-80% of Max Heartrate 60-117    Ratings of Perceived Exertion 11-13    Perceived Dyspnea 0-4      Progression   Progression Continue progressive overload as per policy without signs/symptoms or physical distress.      Resistance Training   Training Prescription Yes    Weight 3  Reps 10-15             Perform Capillary Blood Glucose checks as needed.  Exercise Prescription Changes:   Exercise Prescription Changes     Row Name 07/21/21 0955 08/04/21 0830           Response to Exercise   Blood Pressure (Admit) 140/78 124/70      Blood Pressure (Exercise) 142/76 132/64      Blood Pressure (Exit) 120/64 120/78      Heart Rate (Admit) 90 bpm 83 bpm      Heart Rate (Exercise) 100 bpm 101 bpm      Heart Rate (Exit) 90 bpm 83 bpm      Rating of Perceived Exertion  (Exercise) 11 11.5      Symptoms none none      Comments Pt first day of exercise in the CRP2 program Reviewed MET's, goals and home ExRx      Duration Progress to 30 minutes of  aerobic without signs/symptoms of physical distress Progress to 30 minutes of  aerobic without signs/symptoms of physical distress      Intensity THRR unchanged THRR unchanged        Progression   Progression Continue to progress workloads to maintain intensity without signs/symptoms of physical distress. Continue to progress workloads to maintain intensity without signs/symptoms of physical distress.      Average METs 2.17 2.99        Resistance Training   Training Prescription Yes Yes      Weight 3 3      Reps 10-15 10-15      Time 10 Minutes 10 Minutes        Interval Training   Interval Training No No        NuStep   Level 2 2      SPM 80 80      Minutes 15 15      METs 1.6 3        Track   Laps 15 15      Minutes 15 15      METs 2.74 2.97        Home Exercise Plan   Plans to continue exercise at -- Home (comment)      Frequency -- Add 2 additional days to program exercise sessions.      Initial Home Exercises Provided -- 08/04/21               Exercise Comments:   Exercise Comments     Row Name 07/21/21 1003 08/04/21 0830         Exercise Comments Pt frist day in the CRP2 program. Pt tolerated exercise well with an average MET level of 2.17. Pt is learning his THRR, RPE and ExRx. Pt is off to a good start Reviewed MET's, goals and home ExRx. Pt tolerated exercise well with an average MET level of 2.99. Pt feels ok about his goals of gaining strength and coordination, but it is early on in the program. Pt states he is still working some and yardwork keeps him busy often. Encouranged pt to carve out time when possible to ensure he is able to live an active lifestyle going forward. Pt will exercise at home by walking 1-2 days for 30-45 mins per session.               Exercise Goals  and Review:   Exercise Goals     Row Name 07/15/21 1457  Exercise Goals   Increase Physical Activity Yes       Intervention Provide advice, education, support and counseling about physical activity/exercise needs.;Develop an individualized exercise prescription for aerobic and resistive training based on initial evaluation findings, risk stratification, comorbidities and participant's personal goals.       Expected Outcomes Short Term: Attend rehab on a regular basis to increase amount of physical activity.;Long Term: Add in home exercise to make exercise part of routine and to increase amount of physical activity.;Long Term: Exercising regularly at least 3-5 days a week.       Increase Strength and Stamina Yes       Intervention Provide advice, education, support and counseling about physical activity/exercise needs.;Develop an individualized exercise prescription for aerobic and resistive training based on initial evaluation findings, risk stratification, comorbidities and participant's personal goals.       Expected Outcomes Short Term: Increase workloads from initial exercise prescription for resistance, speed, and METs.;Short Term: Perform resistance training exercises routinely during rehab and add in resistance training at home;Long Term: Improve cardiorespiratory fitness, muscular endurance and strength as measured by increased METs and functional capacity (6MWT)       Able to understand and use rate of perceived exertion (RPE) scale Yes       Intervention Provide education and explanation on how to use RPE scale       Expected Outcomes Short Term: Able to use RPE daily in rehab to express subjective intensity level;Long Term:  Able to use RPE to guide intensity level when exercising independently       Knowledge and understanding of Target Heart Rate Range (THRR) Yes       Intervention Provide education and explanation of THRR including how the numbers were predicted and  where they are located for reference       Expected Outcomes Short Term: Able to state/look up THRR;Long Term: Able to use THRR to govern intensity when exercising independently;Short Term: Able to use daily as guideline for intensity in rehab       Understanding of Exercise Prescription Yes       Intervention Provide education, explanation, and written materials on patient's individual exercise prescription       Expected Outcomes Short Term: Able to explain program exercise prescription;Long Term: Able to explain home exercise prescription to exercise independently                Exercise Goals Re-Evaluation :  Exercise Goals Re-Evaluation     Row Name 07/21/21 0830 08/04/21 0830           Exercise Goal Re-Evaluation   Exercise Goals Review Increase Physical Activity;Increase Strength and Stamina;Able to understand and use rate of perceived exertion (RPE) scale;Knowledge and understanding of Target Heart Rate Range (THRR);Understanding of Exercise Prescription Increase Physical Activity;Increase Strength and Stamina;Able to understand and use rate of perceived exertion (RPE) scale;Knowledge and understanding of Target Heart Rate Range (THRR);Understanding of Exercise Prescription      Comments Pt frist day in the CRP2 program. Pt tolerated exercise well with an average MET level of 2.17. Pt is learning his THRR, RPE and ExRx. Pt is off to a good start Reviewed MET's, goals and home ExRx. Pt tolerated exercise well with an average MET level of 2.99. Pt feels ok about his goals of gaining strength and coordination, but it is early on in the program. Pt states he is still working some and yardwork keeps him busy often. Encouranged pt to carve out  time when possible to ensure he is able to live an active lifestyle going forward. Pt will exercise at home by walking 1-2 days for 30-45 mins per session.      Expected Outcomes Will continue to monitor pt and progress workloads as tolerated without  sign or symptom Pt will continue to exercise on his own at home. Will continue to monitor pt and progress workloads as tolerated without sign or symptom               Discharge Exercise Prescription (Final Exercise Prescription Changes):  Exercise Prescription Changes - 08/04/21 0830       Response to Exercise   Blood Pressure (Admit) 124/70    Blood Pressure (Exercise) 132/64    Blood Pressure (Exit) 120/78    Heart Rate (Admit) 83 bpm    Heart Rate (Exercise) 101 bpm    Heart Rate (Exit) 83 bpm    Rating of Perceived Exertion (Exercise) 11.5    Symptoms none    Comments Reviewed MET's, goals and home ExRx    Duration Progress to 30 minutes of  aerobic without signs/symptoms of physical distress    Intensity THRR unchanged      Progression   Progression Continue to progress workloads to maintain intensity without signs/symptoms of physical distress.    Average METs 2.99      Resistance Training   Training Prescription Yes    Weight 3    Reps 10-15    Time 10 Minutes      Interval Training   Interval Training No      NuStep   Level 2    SPM 80    Minutes 15    METs 3      Track   Laps 15    Minutes 15    METs 2.97      Home Exercise Plan   Plans to continue exercise at Home (comment)    Frequency Add 2 additional days to program exercise sessions.    Initial Home Exercises Provided 08/04/21             Nutrition:  Target Goals: Understanding of nutrition guidelines, daily intake of sodium <1579m, cholesterol <2040m calories 30% from fat and 7% or less from saturated fats, daily to have 5 or more servings of fruits and vegetables.  Biometrics:  Pre Biometrics - 07/15/21 1450       Pre Biometrics   Waist Circumference 44.5 inches    Hip Circumference 41.75 inches    Waist to Hip Ratio 1.07 %    Triceps Skinfold 17 mm    % Body Fat 31.8 %    Grip Strength 37 kg    Flexibility 8 in    Single Leg Stand 30 seconds              Nutrition  Therapy Plan and Nutrition Goals:  Nutrition Therapy & Goals - 07/28/21 0752       Nutrition Therapy   Diet Heart Healthy Diet    Drug/Food Interactions Statins/Certain Fruits      Personal Nutrition Goals   Nutrition Goal Patient to limit to 150041mf sodium per day   in progress   Personal Goal #2 Patient to build a healthy plate to include lean protein/plant protein, fruits, vegetables, whole grains, and low fat dairy products as part of a heart healthy meal plan   in progress   Comments Patient reports making many lifestyle changes including drastically reduced sodium intake, reading  food labels for sodium, increased non-starchy vegetables, and choosing lean protein sources including fish and poultry. Patient reports his wife is [place] and she is supportive of changing her cooking methods to support new dietary changes.      Intervention Plan   Intervention Prescribe, educate and counsel regarding individualized specific dietary modifications aiming towards targeted core components such as weight, hypertension, lipid management, diabetes, heart failure and other comorbidities.    Expected Outcomes Short Term Goal: Understand basic principles of dietary content, such as calories, fat, sodium, cholesterol and nutrients.;Long Term Goal: Adherence to prescribed nutrition plan.             Nutrition Assessments:  Nutrition Assessments - 07/21/21 0759       Rate Your Plate Scores   Pre Score 69            MEDIFICTS Score Key: ?70 Need to make dietary changes  40-70 Heart Healthy Diet ? 40 Therapeutic Level Cholesterol Diet   Flowsheet Row CARDIAC REHAB PHASE II EXERCISE from 07/21/2021 in [place]  Picture Your Plate Total Score on Admission 69      Picture Your Plate Scores: <88 Unhealthy dietary pattern with much room for improvement. 41-50 Dietary pattern unlikely to meet recommendations for good health and room for improvement. 51-60  More healthful dietary pattern, with some room for improvement.  >60 Healthy dietary pattern, although there may be some specific behaviors that could be improved.    Nutrition Goals Re-Evaluation:   Nutrition Goals Re-Evaluation:   Nutrition Goals Discharge (Final Nutrition Goals Re-Evaluation):   Psychosocial: Target Goals: Acknowledge presence or absence of significant depression and/or stress, maximize coping skills, provide positive support system. Participant is able to verbalize types and ability to use techniques and skills needed for reducing stress and depression.  Initial Review & Psychosocial Screening:  Initial Psych Review & Screening - 07/15/21 1325       Initial Review   Current issues with None Identified      Family Dynamics   Good Support System? Yes   Konrad Dolores has his wife and 4 children for support     Barriers   Psychosocial barriers to participate in program There are no identifiable barriers or psychosocial needs.      Screening Interventions   Interventions Encouraged to exercise             Quality of Life Scores:  Quality of Life - 07/15/21 1459       Quality of Life   Select Quality of Life      Quality of Life Scores   Health/Function Pre 24 %    Socioeconomic Pre 22.69 %    Psych/Spiritual Pre 22.5 %    Family Pre 26.8 %    GLOBAL Pre 23.8 %            Scores of 19 and below usually indicate a poorer quality of life in these areas.  A difference of  2-3 points is a clinically meaningful difference.  A difference of 2-3 points in the total score of the Quality of Life Index has been associated with significant improvement in overall quality of life, self-image, physical symptoms, and general health in studies assessing change in quality of life.  PHQ-9: Review Flowsheet        07/15/2021 07/07/2021 05/08/2020 05/10/2019 05/04/2018  Depression screen PHQ 2/9  Decreased Interest 0 0 0 0 0  Down, Depressed, Hopeless 0 0 0 0 0  PHQ -  2 Score 0 0 0 0 0  Altered sleeping   0  0  Tired, decreased energy   0  0  Change in appetite   0  0  Feeling bad or failure about yourself    0  0  Trouble concentrating   0  0  Moving slowly or fidgety/restless   0  0  Suicidal thoughts   0  0  PHQ-9 Score   0  0  Difficult doing work/chores   Not difficult at all  Not difficult at all         Interpretation of Total Score  Total Score Depression Severity:  1-4 = Minimal depression, 5-9 = Mild depression, 10-14 = Moderate depression, 15-19 = Moderately severe depression, 20-27 = Severe depression   Psychosocial Evaluation and Intervention:  Psychosocial Evaluation - 07/21/21 0754       Psychosocial Evaluation & Interventions   Interventions Encouraged to exercise with the program and follow exercise prescription    Comments Karen denies any psychosocial barriers.  Feels supported by his wife for the adoption of heart healthy lifestyle    Expected Outcomes Gryphon will remain free of any psychosocial issues as he continues heart healthy lifestyle    Continue Psychosocial Services  No Follow up required             Psychosocial Re-Evaluation:  Psychosocial Re-Evaluation     [place] Name 07/21/21 0955 08/17/21 1544           Psychosocial Re-Evaluation   Current issues with None Identified None Identified      Comments Omarr expresses no Psychosocial barriers Myson denies any psychosocial barriers and feels supported by family in making heart healthy lifestyle modifications      Expected Outcomes Jerrik will continue to display positive healthy outlook Shalom will continue to display positive healthy outlook      Interventions Encouraged to attend Cardiac Rehabilitation for the exercise Encouraged to attend Cardiac Rehabilitation for the exercise      Continue Psychosocial Services  No Follow up required No Follow up required               Psychosocial Discharge (Final Psychosocial Re-Evaluation):  Psychosocial  Re-Evaluation - 08/17/21 1544       Psychosocial Re-Evaluation   Current issues with None Identified    Comments Jacori denies any psychosocial barriers and feels supported by family in making heart healthy lifestyle modifications    Expected Outcomes Harbert will continue to display positive healthy outlook    Interventions Encouraged to attend Cardiac Rehabilitation for the exercise    Continue Psychosocial Services  No Follow up required             Vocational Rehabilitation: Provide vocational rehab assistance to qualifying candidates.   Vocational Rehab Evaluation & Intervention:  Vocational Rehab - 07/15/21 1326       Initial Vocational Rehab Evaluation & Intervention   Assessment shows need for Vocational Rehabilitation No   Tommy works part time and does not need vocational rehab at this time            Education: Education Goals: Education classes will be provided on a weekly basis, covering required topics. Participant will state understanding/return demonstration of topics presented.  Learning Barriers/Preferences:  Learning Barriers/Preferences - 07/15/21 1500       Learning Barriers/Preferences   Learning Barriers Sight    Learning Preferences Computer/Internet;Group Instruction;Pictoral;Written Material  Education Topics: Count Your Pulse:  -Group instruction provided by verbal instruction, demonstration, patient participation and written materials to support subject.  Instructors address importance of being able to find your pulse and how to count your pulse when at home without a heart monitor.  Patients get hands on experience counting their pulse with staff help and individually.   Heart Attack, Angina, and Risk Factor Modification:  -Group instruction provided by verbal instruction, video, and written materials to support subject.  Instructors address signs and symptoms of angina and heart attacks.    Also discuss risk factors for  heart disease and how to make changes to improve heart health risk factors.   Functional Fitness:  -Group instruction provided by verbal instruction, demonstration, patient participation, and written materials to support subject.  Instructors address safety measures for doing things around the house.  Discuss how to get up and down off the floor, how to pick things up properly, how to safely get out of a chair without assistance, and balance training.   Meditation and Mindfulness:  -Group instruction provided by verbal instruction, patient participation, and written materials to support subject.  Instructor addresses importance of mindfulness and meditation practice to help reduce stress and improve awareness.  Instructor also leads participants through a meditation exercise.    Stretching for Flexibility and Mobility:  -Group instruction provided by verbal instruction, patient participation, and written materials to support subject.  Instructors lead participants through series of stretches that are designed to increase flexibility thus improving mobility.  These stretches are additional exercise for major muscle groups that are typically performed during regular warm up and cool down.   Hands Only CPR:  -Group verbal, video, and participation provides a basic overview of AHA guidelines for community CPR. Role-play of emergencies allow participants the opportunity to practice calling for help and chest compression technique with discussion of AED use.   Hypertension: -Group verbal and written instruction that provides a basic overview of hypertension including the most recent diagnostic guidelines, risk factor reduction with self-care instructions and medication management.    Nutrition I class: Heart Healthy Eating:  -Group instruction provided by PowerPoint slides, verbal discussion, and written materials to support subject matter. The instructor gives an explanation and review of the  Therapeutic Lifestyle Changes diet recommendations, which includes a discussion on lipid goals, dietary fat, sodium, fiber, plant stanol/sterol esters, sugar, and the components of a well-balanced, healthy diet.   Nutrition II class: Lifestyle Skills:  -Group instruction provided by PowerPoint slides, verbal discussion, and written materials to support subject matter. The instructor gives an explanation and review of label reading, grocery shopping for heart health, heart healthy recipe modifications, and ways to make healthier choices when eating out.   Diabetes Question & Answer:  -Group instruction provided by PowerPoint slides, verbal discussion, and written materials to support subject matter. The instructor gives an explanation and review of diabetes co-morbidities, pre- and post-prandial blood glucose goals, pre-exercise blood glucose goals, signs, symptoms, and treatment of hypoglycemia and hyperglycemia, and foot care basics.   Diabetes Blitz:  -Group instruction provided by PowerPoint slides, verbal discussion, and written materials to support subject matter. The instructor gives an explanation and review of the physiology behind type 1 and type 2 diabetes, diabetes medications and rational behind using different medications, pre- and post-prandial blood glucose recommendations and Hemoglobin A1c goals, diabetes diet, and exercise including blood glucose guidelines for exercising safely.    Portion Distortion:  -Group instruction provided by PowerPoint slides,  verbal discussion, written materials, and food models to support subject matter. The instructor gives an explanation of serving size versus portion size, changes in portions sizes over the last 20 years, and what consists of a serving from each food group.   Stress Management:  -Group instruction provided by verbal instruction, video, and written materials to support subject matter.  Instructors review role of stress in heart  disease and how to cope with stress positively.     Exercising on Your Own:  -Group instruction provided by verbal instruction, power point, and written materials to support subject.  Instructors discuss benefits of exercise, components of exercise, frequency and intensity of exercise, and end points for exercise.  Also discuss use of nitroglycerin and activating EMS.  Review options of places to exercise outside of rehab.  Review guidelines for sex with heart disease.   Cardiac Drugs I:  -Group instruction provided by verbal instruction and written materials to support subject.  Instructor reviews cardiac drug classes: antiplatelets, anticoagulants, beta blockers, and statins.  Instructor discusses reasons, side effects, and lifestyle considerations for each drug class.   Cardiac Drugs II:  -Group instruction provided by verbal instruction and written materials to support subject.  Instructor reviews cardiac drug classes: angiotensin converting enzyme inhibitors (ACE-I), angiotensin II receptor blockers (ARBs), nitrates, and calcium channel blockers.  Instructor discusses reasons, side effects, and lifestyle considerations for each drug class.   Anatomy and Physiology of the Circulatory System:  Group verbal and written instruction and models provide basic cardiac anatomy and physiology, with the coronary electrical and arterial systems. Review of: AMI, Angina, Valve disease, Heart Failure, Peripheral Artery Disease, Cardiac Arrhythmia, Pacemakers, and the ICD.   Other Education:  -Group or individual verbal, written, or video instructions that support the educational goals of the cardiac rehab program.   Holiday Eating Survival Tips:  -Group instruction provided by PowerPoint slides, verbal discussion, and written materials to support subject matter. The instructor gives patients tips, tricks, and techniques to help them not only survive but enjoy the holidays despite the onslaught of food  that accompanies the holidays.   Knowledge Questionnaire Score:  Knowledge Questionnaire Score - 07/15/21 1501       Knowledge Questionnaire Score   Pre Score 16/24             Core Components/Risk Factors/Patient Goals at Admission:  Personal Goals and Risk Factors at Admission - 07/15/21 1504       Core Components/Risk Factors/Patient Goals on Admission    Weight Management Yes    Intervention Weight Management: Develop a combined nutrition and exercise program designed to reach desired caloric intake, while maintaining appropriate intake of nutrient and fiber, sodium and fats, and appropriate energy expenditure required for the weight goal.;Weight Management: Provide education and appropriate resources to help participant work on and attain dietary goals.;Weight Management/Obesity: Establish reasonable short term and long term weight goals.;Obesity: Provide education and appropriate resources to help participant work on and attain dietary goals.    Expected Outcomes Short Term: Continue to assess and modify interventions until short term weight is achieved;Long Term: Adherence to nutrition and physical activity/exercise program aimed toward attainment of established weight goal;Weight Loss: Understanding of general recommendations for a balanced deficit meal plan, which promotes 1-2 lb weight loss per week and includes a negative energy balance of [phone] kcal/d;Understanding recommendations for meals to include 15-35% energy as protein, 25-35% energy from fat, 35-60% energy from carbohydrates, less than 228m of dietary cholesterol, 20-35 gm of  total fiber daily;Understanding of distribution of calorie intake throughout the day with the consumption of 4-5 meals/snacks    Hypertension Yes    Intervention Provide education on lifestyle modifcations including regular physical activity/exercise, weight management, moderate sodium restriction and increased consumption of fresh fruit,  vegetables, and low fat dairy, alcohol moderation, and smoking cessation.;Monitor prescription use compliance.    Expected Outcomes Short Term: Continued assessment and intervention until BP is < 140/36m HG in hypertensive participants. < 130/879mHG in hypertensive participants with diabetes, heart failure or chronic kidney disease.;Long Term: Maintenance of blood pressure at goal levels.    Lipids Yes    Intervention Provide education and support for participant on nutrition & aerobic/resistive exercise along with prescribed medications to achieve LDL <7014mHDL >45m32m  Expected Outcomes Short Term: Participant states understanding of desired cholesterol values and is compliant with medications prescribed. Participant is following exercise prescription and nutrition guidelines.;Long Term: Cholesterol controlled with medications as prescribed, with individualized exercise RX and with personalized nutrition plan. Value goals: LDL < 70mg49mL > 40 mg.    Personal Goal Other Yes    Personal Goal Long and short: coordination, energy and strength    Intervention Will continue to monitor pt and progress workloads as tolerated without sign or symptom    Expected Outcomes Pt will achieve his goals             Core Components/Risk Factors/Patient Goals Review:   Goals and Risk Factor Review     Row Name 07/21/21 0959 08/17/21 1545           Core Components/Risk Factors/Patient Goals Review   Personal Goals Review Weight Management/Obesity;Hypertension;Other Weight Management/Obesity;Hypertension;Other      Review [facility] on 5/10.  I anticipate for the 30 day review will see progress toward program and personal goals Tommy completed 8 cardiac rehab sessions.  tommy has good attendance and participates on Wednesdays and Fridays.  Vital signs remain within normal limits. Tommy desires to have more energy and strength. During this review, he has increased his MET level on the  nustep along with increasing his laps. No significant weight loss during this 30 day review. Completed consultation with RD.   Hopeful that as he adopts heart healthy nutrition plan along with exercising outside of rehab will begin to see some progress toward his weight loss goal.      Expected Outcomes ThomaAugusto continue to engage in Cardiac Rehab for exercise, nutrition and adaptation of heart healthy lifestyle modifications. ThomaDomnic continue to engage in Cardiac Rehab for exercise, nutrition and adaptation of heart healthy lifestyle modifications.               Core Components/Risk Factors/Patient Goals at Discharge (Final Review):   Goals and Risk Factor Review - 08/17/21 1545       Core Components/Risk Factors/Patient Goals Review   Personal Goals Review Weight Management/Obesity;Hypertension;Other    Review Tommy completed 8 cardiac rehab sessions.  tommy has good attendance and participates on Wednesdays and Fridays.  Vital signs remain within normal limits. Tommy desires to have more energy and strength. During this review, he has increased his MET level on the nustep along with increasing his laps. No significant weight loss during this 30 day review. Completed consultation with RD.   Hopeful that as he adopts heart healthy nutrition plan along with exercising outside of rehab will begin to see some progress toward his weight loss goal.    Expected  Outcomes Peyten will continue to engage in Cardiac Rehab for exercise, nutrition and adaptation of heart healthy lifestyle modifications.             ITP Comments:  ITP Comments     Row Name 07/15/21 1324 07/21/21 1002 08/17/21 1543       ITP Comments Dr Fransico Him MD, Medical Director Marcello Moores started Cardiac Rehab on 5/10 30 day ITP Review              Comments: Pt is making expected progress toward personal goals after completing 8 sessions. Recommend continued exercise and life style modification education including   stress management and relaxation techniques to decrease cardiac risk profile.  Cherre Huger, BSN Cardiac and Training and development officer

## 2021-08-20 ENCOUNTER — Encounter (HOSPITAL_COMMUNITY)
Admission: RE | Admit: 2021-08-20 | Discharge: 2021-08-20 | Disposition: A | Discharge status: Home / Self Care | Payer: Medicare Other | Source: Ambulatory Visit | Attending: Cardiology | Admitting: Cardiology

## 2021-08-20 DIAGNOSIS — Z9889 Other specified postprocedural states: Secondary | ICD-10-CM

## 2021-08-25 ENCOUNTER — Encounter (HOSPITAL_COMMUNITY)
Admission: RE | Admit: 2021-08-25 | Discharge: 2021-08-25 | Disposition: A | Discharge status: Home / Self Care | Payer: Medicare Other | Source: Ambulatory Visit | Attending: Cardiology | Admitting: Cardiology

## 2021-08-25 DIAGNOSIS — Z9889 Other specified postprocedural states: Secondary | ICD-10-CM

## 2021-08-27 ENCOUNTER — Encounter (HOSPITAL_COMMUNITY)
Admission: RE | Admit: 2021-08-27 | Discharge: 2021-08-27 | Disposition: A | Discharge status: Home / Self Care | Payer: Medicare Other | Source: Ambulatory Visit | Attending: Cardiology | Admitting: Cardiology

## 2021-08-27 DIAGNOSIS — Z9889 Other specified postprocedural states: Secondary | ICD-10-CM

## 2021-09-01 ENCOUNTER — Encounter (HOSPITAL_COMMUNITY)
Admission: RE | Admit: 2021-09-01 | Discharge: 2021-09-01 | Disposition: A | Discharge status: Home / Self Care | Payer: Medicare Other | Source: Ambulatory Visit | Attending: Cardiology | Admitting: Cardiology

## 2021-09-01 DIAGNOSIS — Z9889 Other specified postprocedural states: Secondary | ICD-10-CM | POA: Diagnosis not present

## 2021-09-03 ENCOUNTER — Encounter (HOSPITAL_COMMUNITY)
Admission: RE | Admit: 2021-09-03 | Discharge: 2021-09-03 | Disposition: A | Discharge status: Home / Self Care | Payer: Medicare Other | Source: Ambulatory Visit | Attending: Cardiology | Admitting: Cardiology

## 2021-09-03 DIAGNOSIS — Z9889 Other specified postprocedural states: Secondary | ICD-10-CM

## 2021-09-08 ENCOUNTER — Encounter (HOSPITAL_COMMUNITY)
Admission: RE | Admit: 2021-09-08 | Discharge: 2021-09-08 | Disposition: A | Discharge status: Home / Self Care | Payer: Medicare Other | Source: Ambulatory Visit | Attending: Cardiology | Admitting: Cardiology

## 2021-09-08 DIAGNOSIS — Z9889 Other specified postprocedural states: Secondary | ICD-10-CM | POA: Diagnosis not present

## 2021-09-08 NOTE — Progress Notes (Signed)
Cardiac Individual Treatment Plan  Patient Details  Name: Ryan Mathews MRN: [id number] Date of Birth: 05-18-1945 Referring Provider:   Flowsheet Row CARDIAC REHAB PHASE II ORIENTATION from 07/15/2021 in [place]  Referring Provider Dr. Adrian Prows, MD       Initial Encounter Date:  [place] from 07/15/2021 in [place]  Date 07/15/21       Visit Diagnosis: 04/29/21 S/P MVR at [facility] Dr Cheree Ditto  Patient's Home Medications on Admission:  Current Outpatient Medications:    Ascorbic Acid 500 MG CHEW, Chew 1 tablet by mouth daily., Disp: , Rfl:    aspirin 81 MG chewable tablet, Chew by mouth., Disp: , Rfl:    atorvastatin (LIPITOR) 20 MG tablet, Take 1 tablet by mouth daily., Disp: , Rfl:    b complex vitamins tablet, Take 1 tablet by mouth daily., Disp: , Rfl:    Omega-3 Fatty Acids (FISH OIL) 1000 MG CAPS, Take 1 capsule (1,000 mg total) by mouth daily., Disp: , Rfl:    Omeprazole-Sodium Bicarbonate (ZEGERID OTC) 20-1100 MG CAPS capsule, Take 1 capsule by mouth daily before breakfast. (Patient taking differently: Take 1 capsule by mouth daily as needed (Heartburn).), Disp: 28 each, Rfl: 0  Past Medical History: Past Medical History:  Diagnosis Date   Bilateral inguinal hernia (BIH) s/p lap repair 09/06/2012 08/09/2012   Cataract    CMC arthritis, thumb, degenerative    right   COPD (chronic obstructive pulmonary disease) (HCC)    Emphysema of lung (HCC)    Ex-smoker    GERD (gastroesophageal reflux disease)    History of chicken pox    History of pneumonia 08/06/2009        Hyperlipidemia    Hypertension    Nasal septal deviation 04/14/2017   Marked   Numbness    R cheek stays numb   Obesity (BMI 30-39.9) 08/09/2012    Tobacco Use: Social History   Tobacco Use  Smoking Status Former   Packs/day: 2.00   Years: 40.00   Total pack years: 80.00   Types: Cigarettes    Start date: 03/14/1968   Quit date: 03/14/2006   Years since quitting: 15.4  Smokeless Tobacco Never    Labs: Review Flowsheet  More data exists      Latest Ref Rng & Units 05/04/2018 05/17/2019 05/06/2020 02/16/2021  Labs for ITP Cardiac and Pulmonary Rehab  Cholestrol 0 - 200 mg/dL 132  134  137  -  LDL (calc) 0 - 99 mg/dL 67  68  77  -  HDL-C >39.00 mg/dL 35.10  34.00  35.10  -  Trlycerides 0.0 - 149.0 mg/dL 147.0  157.0  124.0  -  PH, Arterial 7.350 - 7.450 - - - 7.330   PCO2 arterial 32.0 - 48.0 mmHg - - - 41.5   Bicarbonate 20.0 - 28.0 mmol/L - - - 21.9  22.4  21.8  22.7   TCO2 22 - 32 mmol/L - - - _0 Acid-base deficit 0.0 - 2.0 mmol/L - - - 4.0  4.0  4.0  3.0   O2 Saturation % - - - 100.0  70.0  70.0  69.0       07/07/2021  Labs for ITP Cardiac and Pulmonary Rehab  Cholestrol 94   LDL (calc) 37   HDL-C 35.10   Trlycerides 114.0   PH, Arterial -  PCO2 arterial -  Bicarbonate -  TCO2 -  Acid-base deficit -  O2 Saturation -    Capillary Blood Glucose: No results found for: "GLUCAP"   Exercise Target Goals: Exercise Program Goal: Individual exercise prescription set using results from initial 6 min walk test and THRR while considering  patient's activity barriers and safety.   Exercise Prescription Goal: Initial exercise prescription builds to 30-45 minutes a day of aerobic activity, 2-3 days per week.  Home exercise guidelines will be given to patient during program as part of exercise prescription that the participant will acknowledge.  Activity Barriers & Risk Stratification:  Activity Barriers & Cardiac Risk Stratification - 07/15/21 1454       Activity Barriers & Cardiac Risk Stratification   Activity Barriers Back Problems;Joint Problems;Balance Concerns;Deconditioning    Cardiac Risk Stratification High             6 Minute Walk:  6 Minute Walk     Row Name 07/15/21 1451         6 Minute Walk   Phase Initial     Distance 1278  feet     Walk Time 6 minutes     # of Rest Breaks 0     MPH 2.42     METS 2.49     RPE 11     Perceived Dyspnea  0     VO2 Peak 8.73     Symptoms No     Resting HR 86 bpm     Resting BP 144/70     Resting Oxygen Saturation  95 %     Exercise Oxygen Saturation  during 6 min walk 94 %     Max Ex. HR 100 bpm     Max Ex. BP 140/70     2 Minute Post BP 142/76              Oxygen Initial Assessment:   Oxygen Re-Evaluation:   Oxygen Discharge (Final Oxygen Re-Evaluation):   Initial Exercise Prescription:  Initial Exercise Prescription - 07/15/21 1400       Date of Initial Exercise RX and Referring Provider   Date 07/15/21    Referring Provider Dr. Adrian Prows, MD    Expected Discharge Date 09/10/21      NuStep   Level 2    SPM 80    Minutes 15    METs 1.9      Track   Laps 12    Minutes 15    METs 2.39      Prescription Details   Frequency (times per week) 3    Duration Progress to 30 minutes of continuous aerobic without signs/symptoms of physical distress      Intensity   THRR 40-80% of Max Heartrate 60-117    Ratings of Perceived Exertion 11-13    Perceived Dyspnea 0-4      Progression   Progression Continue progressive overload as per policy without signs/symptoms or physical distress.      Resistance Training   Training Prescription Yes    Weight 3    Reps 10-15             Perform Capillary Blood Glucose checks as needed.  Exercise Prescription Changes:   Exercise Prescription Changes     Row Name 07/21/21 0955 08/04/21 0830 08/18/21 0830 09/01/21 0830       Response to Exercise   Blood Pressure (Admit) 140/78 124/70 140/80 142/76    Blood Pressure (Exercise) 142/76 132/64 130/70 176/74  Blood Pressure (Exit) 120/64 120/78 126/62 138/78    Heart Rate (Admit) 90 bpm 83 bpm 87 bpm 74 bpm    Heart Rate (Exercise) 100 bpm 101 bpm 104 bpm 107 bpm    Heart Rate (Exit) 90 bpm 83 bpm 75 bpm 78 bpm    Rating of Perceived Exertion  (Exercise) 11 11.5 11.5 12    Symptoms none none none none    Comments Pt first day of exercise in the CRP2 program Reviewed MET's, goals and home ExRx Reviewed MET's Reviewed MET's and goals    Duration Progress to 30 minutes of  aerobic without signs/symptoms of physical distress Progress to 30 minutes of  aerobic without signs/symptoms of physical distress Progress to 30 minutes of  aerobic without signs/symptoms of physical distress Progress to 30 minutes of  aerobic without signs/symptoms of physical distress    Intensity THRR unchanged THRR unchanged THRR unchanged THRR unchanged      Progression   Progression Continue to progress workloads to maintain intensity without signs/symptoms of physical distress. Continue to progress workloads to maintain intensity without signs/symptoms of physical distress. Continue to progress workloads to maintain intensity without signs/symptoms of physical distress. Continue to progress workloads to maintain intensity without signs/symptoms of physical distress.    Average METs 2.17 2.99 3.46 3.55      Resistance Training   Training Prescription Yes Yes No No    Weight 3 3 -- --    Reps 10-15 10-15 -- --    Time 10 Minutes 10 Minutes -- --      Interval Training   Interval Training No No No No      NuStep   Level _0 SPM 80 80 80 80    Minutes _1 METs 1.6 3 3.6 4      Track   Laps _2 Minutes _3 METs 2.74 2.97 3.32 3.09      Home Exercise Plan   Plans to continue exercise at -- Home (comment) Home (comment) Home (comment)    Frequency -- Add 2 additional days to program exercise sessions. Add 2 additional days to program exercise sessions. Add 2 additional days to program exercise sessions.    Initial Home Exercises Provided -- 08/04/21 08/04/21 08/04/21             Exercise Comments:   Exercise Comments     Row Name 07/21/21 1003 08/04/21 0830 08/18/21 0830 09/01/21 0830     Exercise  Comments Pt frist day in the CRP2 program. Pt tolerated exercise well with an average MET level of 2.17. Pt is learning his THRR, RPE and ExRx. Pt is off to a good start Reviewed MET's, goals and home ExRx. Pt tolerated exercise well with an average MET level of 2.99. Pt feels ok about his goals of gaining strength and coordination, but it is early on in the program. Pt states he is still working some and yardwork keeps him busy often. Encouranged pt to carve out time when possible to ensure he is able to live an active lifestyle going forward. Pt will exercise at home by walking 1-2 days for 30-45 mins per session. Reviewed MET's, goals and home ExRx. Pt tolerated exercise well with an average MET level of 3.46. Pt states he is feeling better and is trying to gain a laps on the track and is increasing  resistance on the Nustep often to gain strength. Encouraged pt that he is doing well. Reviewed MET's and goals. Pt tolerated exercise well with an average MET level of 3.55. Pt is feeling much better about his goals of gaining strength, energy and coordination.             Exercise Goals and Review:   Exercise Goals     Row Name 07/15/21 1457             Exercise Goals   Increase Physical Activity Yes       Intervention Provide advice, education, support and counseling about physical activity/exercise needs.;Develop an individualized exercise prescription for aerobic and resistive training based on initial evaluation findings, risk stratification, comorbidities and participant's personal goals.       Expected Outcomes Short Term: Attend rehab on a regular basis to increase amount of physical activity.;Long Term: Add in home exercise to make exercise part of routine and to increase amount of physical activity.;Long Term: Exercising regularly at least 3-5 days a week.       Increase Strength and Stamina Yes       Intervention Provide advice, education, support and counseling about physical  activity/exercise needs.;Develop an individualized exercise prescription for aerobic and resistive training based on initial evaluation findings, risk stratification, comorbidities and participant's personal goals.       Expected Outcomes Short Term: Increase workloads from initial exercise prescription for resistance, speed, and METs.;Short Term: Perform resistance training exercises routinely during rehab and add in resistance training at home;Long Term: Improve cardiorespiratory fitness, muscular endurance and strength as measured by increased METs and functional capacity (6MWT)       Able to understand and use rate of perceived exertion (RPE) scale Yes       Intervention Provide education and explanation on how to use RPE scale       Expected Outcomes Short Term: Able to use RPE daily in rehab to express subjective intensity level;Long Term:  Able to use RPE to guide intensity level when exercising independently       Knowledge and understanding of Target Heart Rate Range (THRR) Yes       Intervention Provide education and explanation of THRR including how the numbers were predicted and where they are located for reference       Expected Outcomes Short Term: Able to state/look up THRR;Long Term: Able to use THRR to govern intensity when exercising independently;Short Term: Able to use daily as guideline for intensity in rehab       Understanding of Exercise Prescription Yes       Intervention Provide education, explanation, and written materials on patient's individual exercise prescription       Expected Outcomes Short Term: Able to explain program exercise prescription;Long Term: Able to explain home exercise prescription to exercise independently                Exercise Goals Re-Evaluation :  Exercise Goals Re-Evaluation     Row Name 07/21/21 0830 08/04/21 0830 09/01/21 0830         Exercise Goal Re-Evaluation   Exercise Goals Review Increase Physical Activity;Increase Strength and  Stamina;Able to understand and use rate of perceived exertion (RPE) scale;Knowledge and understanding of Target Heart Rate Range (THRR);Understanding of Exercise Prescription Increase Physical Activity;Increase Strength and Stamina;Able to understand and use rate of perceived exertion (RPE) scale;Knowledge and understanding of Target Heart Rate Range (THRR);Understanding of Exercise Prescription Increase Physical Activity;Increase Strength and Stamina;Able to understand  and use rate of perceived exertion (RPE) scale;Knowledge and understanding of Target Heart Rate Range (THRR);Understanding of Exercise Prescription     Comments Pt frist day in the CRP2 program. Pt tolerated exercise well with an average MET level of 2.17. Pt is learning his THRR, RPE and ExRx. Pt is off to a good start Reviewed MET's, goals and home ExRx. Pt tolerated exercise well with an average MET level of 2.99. Pt feels ok about his goals of gaining strength and coordination, but it is early on in the program. Pt states he is still working some and yardwork keeps him busy often. Encouranged pt to carve out time when possible to ensure he is able to live an active lifestyle going forward. Pt will exercise at home by walking 1-2 days for 30-45 mins per session. Reviewed MET's and goals. Pt tolerated exercise well with an average MET level of 3.55. Pt is feeling much better about his goals of gaining strength, energy and coordination.     Expected Outcomes Will continue to monitor pt and progress workloads as tolerated without sign or symptom Pt will continue to exercise on his own at home. Will continue to monitor pt and progress workloads as tolerated without sign or symptom Will continue to monitor pt and progress workloads as tolerated without sign or symptom              Discharge Exercise Prescription (Final Exercise Prescription Changes):  Exercise Prescription Changes - 09/01/21 0830       Response to Exercise   Blood  Pressure (Admit) 142/76    Blood Pressure (Exercise) 176/74    Blood Pressure (Exit) 138/78    Heart Rate (Admit) 74 bpm    Heart Rate (Exercise) 107 bpm    Heart Rate (Exit) 78 bpm    Rating of Perceived Exertion (Exercise) 12    Symptoms none    Comments Reviewed MET's and goals    Duration Progress to 30 minutes of  aerobic without signs/symptoms of physical distress    Intensity THRR unchanged      Progression   Progression Continue to progress workloads to maintain intensity without signs/symptoms of physical distress.    Average METs 3.55      Resistance Training   Training Prescription No      Interval Training   Interval Training No      NuStep   Level 3    SPM 80    Minutes 15    METs 4      Track   Laps 15    Minutes 20    METs 3.09      Home Exercise Plan   Plans to continue exercise at Home (comment)    Frequency Add 2 additional days to program exercise sessions.    Initial Home Exercises Provided 08/04/21             Nutrition:  Target Goals: Understanding of nutrition guidelines, daily intake of sodium '1500mg'$ , cholesterol '200mg'$ , calories 30% from fat and 7% or less from saturated fats, daily to have 5 or more servings of fruits and vegetables.  Biometrics:  Pre Biometrics - 07/15/21 1450       Pre Biometrics   Waist Circumference 44.5 inches    Hip Circumference 41.75 inches    Waist to Hip Ratio 1.07 %    Triceps Skinfold 17 mm    % Body Fat 31.8 %    Grip Strength 37 kg    Flexibility 8 in  Single Leg Stand 30 seconds              Nutrition Therapy Plan and Nutrition Goals:  Nutrition Therapy & Goals - 09/08/21 1012       Nutrition Therapy   Diet Heart Healthy Diet    Drug/Food Interactions Statins/Certain Fruits      Personal Nutrition Goals   Nutrition Goal Patient to limit to $Remove'1500mg'[id number]$  of sodium per day   in progress   Personal Goal #2 Patient to build a healthy plate to include lean protein/plant protein, fruits,  vegetables, whole grains, and low fat dairy products as part of a heart healthy meal plan   in progress   Comments Goals in progress. Patient continues to work on the consistency of dietary changes; he admits holidays, grandchildren, etc disrupt some of his food choices. His wife remains supportive of lifestyle/dietary changes.      Intervention Plan   Intervention Prescribe, educate and counsel regarding individualized specific dietary modifications aiming towards targeted core components such as weight, hypertension, lipid management, diabetes, heart failure and other comorbidities.    Expected Outcomes Short Term Goal: Understand basic principles of dietary content, such as calories, fat, sodium, cholesterol and nutrients.;Long Term Goal: Adherence to prescribed nutrition plan.             Nutrition Assessments:  Nutrition Assessments - 07/21/21 0759       Rate Your Plate Scores   Pre Score 69            MEDIFICTS Score Key: ?70 Need to make dietary changes  40-70 Heart Healthy Diet ? 40 Therapeutic Level Cholesterol Diet   Flowsheet Row CARDIAC REHAB PHASE II EXERCISE from 07/21/2021 in [place]  Picture Your Plate Total Score on Admission 69      Picture Your Plate Scores: <30 Unhealthy dietary pattern with much room for improvement. 41-50 Dietary pattern unlikely to meet recommendations for good health and room for improvement. 51-60 More healthful dietary pattern, with some room for improvement.  >60 Healthy dietary pattern, although there may be some specific behaviors that could be improved.    Nutrition Goals Re-Evaluation:  Nutrition Goals Re-Evaluation     [place] Name 09/08/21 1012             Goals   Current Weight 189 lb 13.1 oz (86.1 kg)       Expected Outcome Goals in progress. Patient continues to work on the consistency of dietary changes; he admits holidays, grandchildren, etc disrupt some of his food choices. His  wife remains supportive of lifestyle/dietary changes. Lipid lab panel is improved; HDL low at 35. Weight is up about 5.2# since starting with our program.                Nutrition Goals Re-Evaluation:  Nutrition Goals Re-Evaluation     [place] Name 09/08/21 1012             Goals   Current Weight 189 lb 13.1 oz (86.1 kg)       Expected Outcome Goals in progress. Patient continues to work on the consistency of dietary changes; he admits holidays, grandchildren, etc disrupt some of his food choices. His wife remains supportive of lifestyle/dietary changes. Lipid lab panel is improved; HDL low at 35. Weight is up about 5.2# since starting with our program.                Nutrition Goals Discharge (Final Nutrition Goals Re-Evaluation):  Nutrition Goals Re-Evaluation - 09/08/21 1012       Goals   Current Weight 189 lb 13.1 oz (86.1 kg)    Expected Outcome Goals in progress. Patient continues to work on the consistency of dietary changes; he admits holidays, grandchildren, etc disrupt some of his food choices. His wife remains supportive of lifestyle/dietary changes. Lipid lab panel is improved; HDL low at 35. Weight is up about 5.2# since starting with our program.             Psychosocial: Target Goals: Acknowledge presence or absence of significant depression and/or stress, maximize coping skills, provide positive support system. Participant is able to verbalize types and ability to use techniques and skills needed for reducing stress and depression.  Initial Review & Psychosocial Screening:  Initial Psych Review & Screening - 07/15/21 1325       Initial Review   Current issues with None Identified      Family Dynamics   Good Support System? Yes   Ryan Mathews has his wife and 4 children for support     Barriers   Psychosocial barriers to participate in program There are no identifiable barriers or psychosocial needs.      Screening Interventions   Interventions Encouraged  to exercise             Quality of Life Scores:  Quality of Life - 07/15/21 1459       Quality of Life   Select Quality of Life      Quality of Life Scores   Health/Function Pre 24 %    Socioeconomic Pre 22.69 %    Psych/Spiritual Pre 22.5 %    Family Pre 26.8 %    GLOBAL Pre 23.8 %            Scores of 19 and below usually indicate a poorer quality of life in these areas.  A difference of  2-3 points is a clinically meaningful difference.  A difference of 2-3 points in the total score of the Quality of Life Index has been associated with significant improvement in overall quality of life, self-image, physical symptoms, and general health in studies assessing change in quality of life.  PHQ-9: Review Flowsheet  More data exists      07/15/2021 07/07/2021 05/08/2020 05/10/2019 05/04/2018  Depression screen PHQ 2/9  Decreased Interest 0 0 0 0 0  Down, Depressed, Hopeless 0 0 0 0 0  PHQ - 2 Score 0 0 0 0 0  Altered sleeping - - 0 - 0  Tired, decreased energy - - 0 - 0  Change in appetite - - 0 - 0  Feeling bad or failure about yourself  - - 0 - 0  Trouble concentrating - - 0 - 0  Moving slowly or fidgety/restless - - 0 - 0  Suicidal thoughts - - 0 - 0  PHQ-9 Score - - 0 - 0  Difficult doing work/chores - - Not difficult at all - Not difficult at all   Interpretation of Total Score  Total Score Depression Severity:  1-4 = Minimal depression, 5-9 = Mild depression, 10-14 = Moderate depression, 15-19 = Moderately severe depression, 20-27 = Severe depression   Psychosocial Evaluation and Intervention:  Psychosocial Evaluation - 07/21/21 0754       Psychosocial Evaluation & Interventions   Interventions Encouraged to exercise with the program and follow exercise prescription    Comments Ryan Mathews denies any psychosocial barriers.  Feels supported by his wife for the  adoption of heart healthy lifestyle    Expected Outcomes Kaliel will remain free of any psychosocial issues as  he continues heart healthy lifestyle    Continue Psychosocial Services  No Follow up required             Psychosocial Re-Evaluation:  Psychosocial Re-Evaluation     [place] Name 07/21/21 0955 08/17/21 1544 09/03/21 1348         Psychosocial Re-Evaluation   Current issues with None Identified None Identified --     Comments Ryan Mathews expresses no Psychosocial barriers Ryan Mathews denies any psychosocial barriers and feels supported by family in making heart healthy lifestyle modifications Ryan Mathews has some concerns regarding his wife health.  She has had one knee done needs to have the other knee done but found out she has macular degneration.  He would like to go out more but is limited by her mobility. he does however feel supported.     Expected Outcomes Ryan Mathews will continue to display positive healthy outlook Ryan Mathews will continue to display positive healthy outlook Ryan Mathews will continue to display positive healthy outlook     Interventions Encouraged to attend Cardiac Rehabilitation for the exercise Encouraged to attend Cardiac Rehabilitation for the exercise Encouraged to attend Cardiac Rehabilitation for the exercise     Continue Psychosocial Services  No Follow up required No Follow up required No Follow up required              Psychosocial Discharge (Final Psychosocial Re-Evaluation):  Psychosocial Re-Evaluation - 09/03/21 1348       Psychosocial Re-Evaluation   Comments Ryan Mathews has some concerns regarding his wife health.  She has had one knee done needs to have the other knee done but found out she has macular degneration.  He would like to go out more but is limited by her mobility. he does however feel supported.    Expected Outcomes Ryan Mathews will continue to display positive healthy outlook    Interventions Encouraged to attend Cardiac Rehabilitation for the exercise    Continue Psychosocial Services  No Follow up required             Vocational Rehabilitation: Provide  vocational rehab assistance to qualifying candidates.   Vocational Rehab Evaluation & Intervention:  Vocational Rehab - 07/15/21 1326       Initial Vocational Rehab Evaluation & Intervention   Assessment shows need for Vocational Rehabilitation No   Ryan Mathews works part time and does not need vocational rehab at this time            Education: Education Goals: Education classes will be provided on a weekly basis, covering required topics. Participant will state understanding/return demonstration of topics presented.  Learning Barriers/Preferences:  Learning Barriers/Preferences - 07/15/21 1500       Learning Barriers/Preferences   Learning Barriers Sight    Learning Preferences Computer/Internet;Group Instruction;Pictoral;Written Material             Education Topics: Count Your Pulse:  -Group instruction provided by verbal instruction, demonstration, patient participation and written materials to support subject.  Instructors address importance of being able to find your pulse and how to count your pulse when at home without a heart monitor.  Patients get hands on experience counting their pulse with staff help and individually.   Heart Attack, Angina, and Risk Factor Modification:  -Group instruction provided by verbal instruction, video, and written materials to support subject.  Instructors address signs and symptoms of angina and heart attacks.  Also discuss risk factors for heart disease and how to make changes to improve heart health risk factors.   Functional Fitness:  -Group instruction provided by verbal instruction, demonstration, patient participation, and written materials to support subject.  Instructors address safety measures for doing things around the house.  Discuss how to get up and down off the floor, how to pick things up properly, how to safely get out of a chair without assistance, and balance training.   Meditation and Mindfulness:  -Group  instruction provided by verbal instruction, patient participation, and written materials to support subject.  Instructor addresses importance of mindfulness and meditation practice to help reduce stress and improve awareness.  Instructor also leads participants through a meditation exercise.    Stretching for Flexibility and Mobility:  -Group instruction provided by verbal instruction, patient participation, and written materials to support subject.  Instructors lead participants through series of stretches that are designed to increase flexibility thus improving mobility.  These stretches are additional exercise for major muscle groups that are typically performed during regular warm up and cool down.   Hands Only CPR:  -Group verbal, video, and participation provides a basic overview of AHA guidelines for community CPR. Role-play of emergencies allow participants the opportunity to practice calling for help and chest compression technique with discussion of AED use.   Hypertension: -Group verbal and written instruction that provides a basic overview of hypertension including the most recent diagnostic guidelines, risk factor reduction with self-care instructions and medication management.    Nutrition I class: Heart Healthy Eating:  -Group instruction provided by PowerPoint slides, verbal discussion, and written materials to support subject matter. The instructor gives an explanation and review of the Therapeutic Lifestyle Changes diet recommendations, which includes a discussion on lipid goals, dietary fat, sodium, fiber, plant stanol/sterol esters, sugar, and the components of a well-balanced, healthy diet.   Nutrition II class: Lifestyle Skills:  -Group instruction provided by PowerPoint slides, verbal discussion, and written materials to support subject matter. The instructor gives an explanation and review of label reading, grocery shopping for heart health, heart healthy recipe  modifications, and ways to make healthier choices when eating out.   Diabetes Question & Answer:  -Group instruction provided by PowerPoint slides, verbal discussion, and written materials to support subject matter. The instructor gives an explanation and review of diabetes co-morbidities, pre- and post-prandial blood glucose goals, pre-exercise blood glucose goals, signs, symptoms, and treatment of hypoglycemia and hyperglycemia, and foot care basics.   Diabetes Blitz:  -Group instruction provided by PowerPoint slides, verbal discussion, and written materials to support subject matter. The instructor gives an explanation and review of the physiology behind type 1 and type 2 diabetes, diabetes medications and rational behind using different medications, pre- and post-prandial blood glucose recommendations and Hemoglobin A1c goals, diabetes diet, and exercise including blood glucose guidelines for exercising safely.    Portion Distortion:  -Group instruction provided by PowerPoint slides, verbal discussion, written materials, and food models to support subject matter. The instructor gives an explanation of serving size versus portion size, changes in portions sizes over the last 20 years, and what consists of a serving from each food group.   Stress Management:  -Group instruction provided by verbal instruction, video, and written materials to support subject matter.  Instructors review role of stress in heart disease and how to cope with stress positively.     Exercising on Your Own:  -Group instruction provided by verbal instruction, power point, and written materials  to support subject.  Instructors discuss benefits of exercise, components of exercise, frequency and intensity of exercise, and end points for exercise.  Also discuss use of nitroglycerin and activating EMS.  Review options of places to exercise outside of rehab.  Review guidelines for sex with heart disease.   Cardiac Drugs I:   -Group instruction provided by verbal instruction and written materials to support subject.  Instructor reviews cardiac drug classes: antiplatelets, anticoagulants, beta blockers, and statins.  Instructor discusses reasons, side effects, and lifestyle considerations for each drug class.   Cardiac Drugs II:  -Group instruction provided by verbal instruction and written materials to support subject.  Instructor reviews cardiac drug classes: angiotensin converting enzyme inhibitors (ACE-I), angiotensin II receptor blockers (ARBs), nitrates, and calcium channel blockers.  Instructor discusses reasons, side effects, and lifestyle considerations for each drug class.   Anatomy and Physiology of the Circulatory System:  Group verbal and written instruction and models provide basic cardiac anatomy and physiology, with the coronary electrical and arterial systems. Review of: AMI, Angina, Valve disease, Heart Failure, Peripheral Artery Disease, Cardiac Arrhythmia, Pacemakers, and the ICD.   Other Education:  -Group or individual verbal, written, or video instructions that support the educational goals of the cardiac rehab program.   Holiday Eating Survival Tips:  -Group instruction provided by PowerPoint slides, verbal discussion, and written materials to support subject matter. The instructor gives patients tips, tricks, and techniques to help them not only survive but enjoy the holidays despite the onslaught of food that accompanies the holidays.   Knowledge Questionnaire Score:  Knowledge Questionnaire Score - 07/15/21 1501       Knowledge Questionnaire Score   Pre Score 16/24             Core Components/Risk Factors/Patient Goals at Admission:  Personal Goals and Risk Factors at Admission - 07/15/21 1504       Core Components/Risk Factors/Patient Goals on Admission    Weight Management Yes    Intervention Weight Management: Develop a combined nutrition and exercise program designed  to reach desired caloric intake, while maintaining appropriate intake of nutrient and fiber, sodium and fats, and appropriate energy expenditure required for the weight goal.;Weight Management: Provide education and appropriate resources to help participant work on and attain dietary goals.;Weight Management/Obesity: Establish reasonable short term and long term weight goals.;Obesity: Provide education and appropriate resources to help participant work on and attain dietary goals.    Expected Outcomes Short Term: Continue to assess and modify interventions until short term weight is achieved;Long Term: Adherence to nutrition and physical activity/exercise program aimed toward attainment of established weight goal;Weight Loss: Understanding of general recommendations for a balanced deficit meal plan, which promotes 1-2 lb weight loss per week and includes a negative energy balance of [id number] kcal/d;Understanding recommendations for meals to include 15-35% energy as protein, 25-35% energy from fat, 35-60% energy from carbohydrates, less than $RemoveB'200mg'[id number]$  of dietary cholesterol, 20-35 gm of total fiber daily;Understanding of distribution of calorie intake throughout the day with the consumption of 4-5 meals/snacks    Hypertension Yes    Intervention Provide education on lifestyle modifcations including regular physical activity/exercise, weight management, moderate sodium restriction and increased consumption of fresh fruit, vegetables, and low fat dairy, alcohol moderation, and smoking cessation.;Monitor prescription use compliance.    Expected Outcomes Short Term: Continued assessment and intervention until BP is < 140/13mm HG in hypertensive participants. < 130/3mm HG in hypertensive participants with diabetes, heart failure or chronic kidney disease.;Long Term: Maintenance of  blood pressure at goal levels.    Lipids Yes    Intervention Provide education and support for participant on nutrition & aerobic/resistive  exercise along with prescribed medications to achieve LDL <15m, HDL >480m    Expected Outcomes Short Term: Participant states understanding of desired cholesterol values and is compliant with medications prescribed. Participant is following exercise prescription and nutrition guidelines.;Long Term: Cholesterol controlled with medications as prescribed, with individualized exercise RX and with personalized nutrition plan. Value goals: LDL < 7025mHDL > 40 mg.    Personal Goal Other Yes    Personal Goal Long and short: coordination, energy and strength    Intervention Will continue to monitor pt and progress workloads as tolerated without sign or symptom    Expected Outcomes Pt will achieve his goals             Core Components/Risk Factors/Patient Goals Review:   Goals and Risk Factor Review     Row Name 07/21/21 0959 08/17/21 1545 09/03/21 1354         Core Components/Risk Factors/Patient Goals Review   Personal Goals Review Weight Management/Obesity;Hypertension;Other Weight Management/Obesity;Hypertension;Other Weight Management/Obesity;Hypertension;Other     Review [facility] on 5/10.  I anticipate for the 30 day review will see progress toward program and personal goals [facility] completed 8 cardiac rehab sessions.  [facility]ws has good attendance and participates on Wednesdays and Fridays.  Vital signs remain within normal limits. [facility] desires to have more energy and strength. During this review, he has increased his MET level on the nustep along with increasing his laps. No significant weight loss during this 30 day review. Completed consultation with RD.   Hopeful that as he adopts heart healthy nutrition plan along with exercising outside of rehab will begin to see some progress toward his weight loss goal. TomKonrad Doloresnsistently attends cardiac rehab 2 x week W/F.  Vital signs remain within normal limits however this particular week he is running a bit higher as his  grandchildren are here visiting and he is eating kid friendly foods.  [facility] desires to have more energy and strength.  He averages MET level of 4.0 on the nustep with workload of 4 and typically ambulates 19-20 laps in 15 minutes     Expected Outcomes [facility] continue to engage in Cardiac Rehab for exercise, nutrition and adaptation of heart healthy lifestyle modifications. [facility] continue to engage in Cardiac Rehab for exercise, nutrition and adaptation of heart healthy lifestyle modifications. [facility]ws continue to engage in Cardiac Rehab for exercise, nutrition and adaptation of heart healthy lifestyle modifications.              Core Components/Risk Factors/Patient Goals at Discharge (Final Review):   Goals and Risk Factor Review - 09/03/21 1354       Core Components/Risk Factors/Patient Goals Review   Personal Goals Review Weight Management/Obesity;Hypertension;Other    Review [facility]ws consistently attends cardiac rehab 2 x week W/F.  Vital signs remain within normal limits however this particular week he is running a bit higher as his grandchildren are here visiting and he is eating kid friendly foods.  [facility]ws desires to have more energy and strength.  He averages MET level of 4.0 on the nustep with workload of 4 and typically ambulates 19-20 laps in 15 minutes    Expected Outcomes [facility]ws continue to engage in Cardiac Rehab for exercise, nutrition and adaptation of heart healthy lifestyle modifications.  ITP Comments:  ITP Comments     Row Name 07/15/21 1324 07/21/21 1002 08/17/21 1543 09/03/21 1344     ITP Comments Dr Fransico Him MD, Medical Director [facility] started Cardiac Rehab on 5/10 30 day ITP Review 30 day ITP Review             Comments: Pt has good attendance and is progressing well in the program.

## 2021-09-10 ENCOUNTER — Encounter (HOSPITAL_COMMUNITY)
Admission: RE | Admit: 2021-09-10 | Discharge: 2021-09-10 | Disposition: A | Discharge status: Home / Self Care | Payer: Medicare Other | Source: Ambulatory Visit | Attending: Cardiology | Admitting: Cardiology

## 2021-09-10 DIAGNOSIS — Z9889 Other specified postprocedural states: Secondary | ICD-10-CM

## 2021-09-15 ENCOUNTER — Telehealth: Payer: Self-pay

## 2021-09-15 ENCOUNTER — Encounter (HOSPITAL_COMMUNITY)
Admission: RE | Admit: 2021-09-15 | Discharge: 2021-09-15 | Disposition: A | Discharge status: Home / Self Care | Payer: Medicare Other | Source: Ambulatory Visit | Attending: Cardiology | Admitting: Cardiology

## 2021-09-15 DIAGNOSIS — Z4889 Encounter for other specified surgical aftercare: Secondary | ICD-10-CM | POA: Diagnosis not present

## 2021-09-15 DIAGNOSIS — Z9889 Other specified postprocedural states: Secondary | ICD-10-CM | POA: Insufficient documentation

## 2021-09-15 DIAGNOSIS — Z952 Presence of prosthetic heart valve: Secondary | ICD-10-CM | POA: Insufficient documentation

## 2021-09-15 NOTE — Telephone Encounter (Signed)
Pt called in regard to a dental work, pt mention that the dentist wanted him to run it by with you due that he has a valve replacement in February. Is it ok for him to have this dental work?

## 2021-09-15 NOTE — Telephone Encounter (Signed)
Called pt to inform him about the message above pt understood and will call us when he makes an appt in order for [place] to send him some antibiotics.

## 2021-09-15 NOTE — Telephone Encounter (Signed)
Would wait until 6 months after valve surgery and patient will need antibiotics prior to dental work.

## 2021-09-17 ENCOUNTER — Encounter (HOSPITAL_COMMUNITY): Payer: Medicare Other

## 2021-09-20 ENCOUNTER — Encounter (HOSPITAL_COMMUNITY): Payer: Medicare Other

## 2021-09-22 ENCOUNTER — Encounter (HOSPITAL_COMMUNITY)
Admission: RE | Admit: 2021-09-22 | Discharge: 2021-09-22 | Disposition: A | Discharge status: Home / Self Care | Payer: Medicare Other | Source: Ambulatory Visit | Attending: Cardiology | Admitting: Cardiology

## 2021-09-22 DIAGNOSIS — Z9889 Other specified postprocedural states: Secondary | ICD-10-CM

## 2021-09-22 DIAGNOSIS — Z4889 Encounter for other specified surgical aftercare: Secondary | ICD-10-CM | POA: Diagnosis not present

## 2021-09-22 DIAGNOSIS — Z952 Presence of prosthetic heart valve: Secondary | ICD-10-CM | POA: Diagnosis not present

## 2021-09-24 ENCOUNTER — Encounter (HOSPITAL_COMMUNITY)
Admission: RE | Admit: 2021-09-24 | Discharge: 2021-09-24 | Disposition: A | Discharge status: Home / Self Care | Payer: Medicare Other | Source: Ambulatory Visit | Attending: Cardiology | Admitting: Cardiology

## 2021-09-24 DIAGNOSIS — Z9889 Other specified postprocedural states: Secondary | ICD-10-CM | POA: Diagnosis not present

## 2021-09-24 DIAGNOSIS — Z952 Presence of prosthetic heart valve: Secondary | ICD-10-CM | POA: Diagnosis not present

## 2021-09-24 DIAGNOSIS — Z4889 Encounter for other specified surgical aftercare: Secondary | ICD-10-CM | POA: Diagnosis not present

## 2021-09-27 ENCOUNTER — Encounter (HOSPITAL_COMMUNITY): Payer: Medicare Other

## 2021-09-29 ENCOUNTER — Encounter (HOSPITAL_COMMUNITY)
Admission: RE | Admit: 2021-09-29 | Discharge: 2021-09-29 | Disposition: A | Discharge status: Home / Self Care | Payer: Medicare Other | Source: Ambulatory Visit | Attending: Cardiology | Admitting: Cardiology

## 2021-09-29 DIAGNOSIS — Z952 Presence of prosthetic heart valve: Secondary | ICD-10-CM | POA: Diagnosis not present

## 2021-09-29 DIAGNOSIS — Z9889 Other specified postprocedural states: Secondary | ICD-10-CM | POA: Diagnosis not present

## 2021-09-29 DIAGNOSIS — Z4889 Encounter for other specified surgical aftercare: Secondary | ICD-10-CM | POA: Diagnosis not present

## 2021-10-01 ENCOUNTER — Encounter (HOSPITAL_COMMUNITY)
Admission: RE | Admit: 2021-10-01 | Discharge: 2021-10-01 | Disposition: A | Discharge status: Home / Self Care | Payer: Medicare Other | Source: Ambulatory Visit | Attending: Cardiology | Admitting: Cardiology

## 2021-10-01 DIAGNOSIS — Z9889 Other specified postprocedural states: Secondary | ICD-10-CM

## 2021-10-01 DIAGNOSIS — Z952 Presence of prosthetic heart valve: Secondary | ICD-10-CM | POA: Diagnosis not present

## 2021-10-01 DIAGNOSIS — Z4889 Encounter for other specified surgical aftercare: Secondary | ICD-10-CM | POA: Diagnosis not present

## 2021-10-04 ENCOUNTER — Encounter (HOSPITAL_COMMUNITY): Payer: Medicare Other

## 2021-10-06 ENCOUNTER — Encounter (HOSPITAL_COMMUNITY)
Admission: RE | Admit: 2021-10-06 | Discharge: 2021-10-06 | Disposition: A | Discharge status: Home / Self Care | Payer: Medicare Other | Source: Ambulatory Visit | Attending: Cardiology | Admitting: Cardiology

## 2021-10-06 DIAGNOSIS — Z9889 Other specified postprocedural states: Secondary | ICD-10-CM | POA: Diagnosis not present

## 2021-10-06 DIAGNOSIS — Z4889 Encounter for other specified surgical aftercare: Secondary | ICD-10-CM | POA: Diagnosis not present

## 2021-10-06 DIAGNOSIS — Z952 Presence of prosthetic heart valve: Secondary | ICD-10-CM | POA: Diagnosis not present

## 2021-10-06 NOTE — Progress Notes (Signed)
Cardiac Individual Treatment Plan  Patient Details  Name: DJ SENTENO MRN: [id number] Date of Birth: 18-May-1945 Referring Provider:   Flowsheet Row CARDIAC REHAB PHASE II ORIENTATION from 07/15/2021 in [facility]  Referring Provider Dr. Adrian Prows, MD       Initial Encounter Date:  [place] from 07/15/2021 in [facility]  Date 07/15/21       Visit Diagnosis: 04/29/21 S/P MVR at [facility] Dr Cheree Ditto  Patient's Home Medications on Admission:  Current Outpatient Medications:    Ascorbic Acid 500 MG CHEW, Chew 1 tablet by mouth daily., Disp: , Rfl:    aspirin 81 MG chewable tablet, Chew by mouth., Disp: , Rfl:    atorvastatin (LIPITOR) 20 MG tablet, Take 1 tablet by mouth daily., Disp: , Rfl:    b complex vitamins tablet, Take 1 tablet by mouth daily., Disp: , Rfl:    Omega-3 Fatty Acids (FISH OIL) 1000 MG CAPS, Take 1 capsule (1,000 mg total) by mouth daily., Disp: , Rfl:    Omeprazole-Sodium Bicarbonate (ZEGERID OTC) 20-1100 MG CAPS capsule, Take 1 capsule by mouth daily before breakfast. (Patient taking differently: Take 1 capsule by mouth daily as needed (Heartburn).), Disp: 28 each, Rfl: 0  Past Medical History: Past Medical History:  Diagnosis Date   Bilateral inguinal hernia (BIH) s/p lap repair 09/06/2012 08/09/2012   Cataract    CMC arthritis, thumb, degenerative    right   COPD (chronic obstructive pulmonary disease) (HCC)    Emphysema of lung (HCC)    Ex-smoker    GERD (gastroesophageal reflux disease)    History of chicken pox    History of pneumonia 08/06/2009        Hyperlipidemia    Hypertension    Nasal septal deviation 04/14/2017   Marked   Numbness    R cheek stays numb   Obesity (BMI 30-39.9) 08/09/2012    Tobacco Use: Social History   Tobacco Use  Smoking Status Former   Packs/day: 2.00   Years: 40.00   Total pack years: 80.00   Types: Cigarettes    Start date: 03/14/1968   Quit date: 03/14/2006   Years since quitting: 15.5  Smokeless Tobacco Never    Labs: Review Flowsheet  More data exists      Latest Ref Rng & Units 05/04/2018 05/17/2019 05/06/2020 02/16/2021 07/07/2021  Labs for ITP Cardiac and Pulmonary Rehab  Cholestrol 0 - 200 mg/dL 132  134  137  - 94   LDL (calc) 0 - 99 mg/dL 67  68  77  - 37   HDL-C >39.00 mg/dL 35.10  34.00  35.10  - 35.10   Trlycerides 0.0 - 149.0 mg/dL 147.0  157.0  124.0  - 114.0   PH, Arterial 7.350 - 7.450 - - - 7.330  -  PCO2 arterial 32.0 - 48.0 mmHg - - - 41.5  -  Bicarbonate 20.0 - 28.0 mmol/L - - - 21.9  22.4  21.8  22.7  -  TCO2 22 - 32 mmol/L - - - '23  24  23  24  ' -  Acid-base deficit 0.0 - 2.0 mmol/L - - - 4.0  4.0  4.0  3.0  -  O2 Saturation % - - - 100.0  70.0  70.0  69.0  -    Capillary Blood Glucose: No results found for: "GLUCAP"   Exercise Target Goals: Exercise Program Goal: Individual exercise prescription set  using results from initial 6 min walk test and THRR while considering  patient's activity barriers and safety.   Exercise Prescription Goal: Initial exercise prescription builds to 30-45 minutes a day of aerobic activity, 2-3 days per week.  Home exercise guidelines will be given to patient during program as part of exercise prescription that the participant will acknowledge.  Activity Barriers & Risk Stratification:  Activity Barriers & Cardiac Risk Stratification - 07/15/21 1454       Activity Barriers & Cardiac Risk Stratification   Activity Barriers Back Problems;Joint Problems;Balance Concerns;Deconditioning    Cardiac Risk Stratification High             6 Minute Walk:  6 Minute Walk     Row Name 07/15/21 1451         6 Minute Walk   Phase Initial     Distance 1278 feet     Walk Time 6 minutes     # of Rest Breaks 0     MPH 2.42     METS 2.49     RPE 11     Perceived Dyspnea  0     VO2 Peak 8.73     Symptoms No     Resting HR 86 bpm      Resting BP 144/70     Resting Oxygen Saturation  95 %     Exercise Oxygen Saturation  during 6 min walk 94 %     Max Ex. HR 100 bpm     Max Ex. BP 140/70     2 Minute Post BP 142/76              Oxygen Initial Assessment:   Oxygen Re-Evaluation:   Oxygen Discharge (Final Oxygen Re-Evaluation):   Initial Exercise Prescription:  Initial Exercise Prescription - 07/15/21 1400       Date of Initial Exercise RX and Referring Provider   Date 07/15/21    Referring Provider Dr. Adrian Prows, MD    Expected Discharge Date 09/10/21      NuStep   Level 2    SPM 80    Minutes 15    METs 1.9      Track   Laps 12    Minutes 15    METs 2.39      Prescription Details   Frequency (times per week) 3    Duration Progress to 30 minutes of continuous aerobic without signs/symptoms of physical distress      Intensity   THRR 40-80% of Max Heartrate 60-117    Ratings of Perceived Exertion 11-13    Perceived Dyspnea 0-4      Progression   Progression Continue progressive overload as per policy without signs/symptoms or physical distress.      Resistance Training   Training Prescription Yes    Weight 3    Reps 10-15             Perform Capillary Blood Glucose checks as needed.  Exercise Prescription Changes:   Exercise Prescription Changes     Row Name 07/21/21 0955 08/04/21 0830 08/18/21 0830 09/01/21 0830 09/22/21 0835     Response to Exercise   Blood Pressure (Admit) 140/78 124/70 140/80 142/76 128/62   Blood Pressure (Exercise) 142/76 132/64 130/70 176/74 158/78   Blood Pressure (Exit) 120/64 120/78 126/62 138/78 120/70   Heart Rate (Admit) 90 bpm 83 bpm 87 bpm 74 bpm 75 bpm   Heart Rate (Exercise) 100 bpm 101 bpm 104 bpm 107  bpm 106 bpm   Heart Rate (Exit) 90 bpm 83 bpm 75 bpm 78 bpm 67 bpm   Rating of Perceived Exertion (Exercise) 11 11.5 11.'5 12 12   ' Symptoms none none none none none   Comments Pt first day of exercise in the CRP2 program Reviewed MET's,  goals and home ExRx Reviewed MET's Reviewed MET's and goals Reviewed MET's   Duration Progress to 30 minutes of  aerobic without signs/symptoms of physical distress Progress to 30 minutes of  aerobic without signs/symptoms of physical distress Progress to 30 minutes of  aerobic without signs/symptoms of physical distress Progress to 30 minutes of  aerobic without signs/symptoms of physical distress Progress to 30 minutes of  aerobic without signs/symptoms of physical distress   Intensity THRR unchanged THRR unchanged THRR unchanged THRR unchanged THRR unchanged     Progression   Progression Continue to progress workloads to maintain intensity without signs/symptoms of physical distress. Continue to progress workloads to maintain intensity without signs/symptoms of physical distress. Continue to progress workloads to maintain intensity without signs/symptoms of physical distress. Continue to progress workloads to maintain intensity without signs/symptoms of physical distress. Continue to progress workloads to maintain intensity without signs/symptoms of physical distress.   Average METs 2.17 2.99 3.46 3.55 3.45     Resistance Training   Training Prescription Yes Yes No No No   Weight 3 3 -- -- --   Reps 10-15 10-15 -- -- --   Time 10 Minutes 10 Minutes -- -- --     Interval Training   Interval Training No No No No No     NuStep   Level '2 2 3 3 4   ' SPM 80 80 80 80 100   Minutes '15 15 15 15 15   ' METs 1.6 3 3.6 4 3.8     Track   Laps '15 15 15 15 15   ' Minutes '15 15 20 20 20   ' METs 2.74 2.97 3.32 3.09 3.09     Home Exercise Plan   Plans to continue exercise at -- Home (comment) Home (comment) Home (comment) Home (comment)   Frequency -- Add 2 additional days to program exercise sessions. Add 2 additional days to program exercise sessions. Add 2 additional days to program exercise sessions. Add 2 additional days to program exercise sessions.   Initial Home Exercises Provided -- 08/04/21  08/04/21 08/04/21 08/04/21    Row Name 10/01/21 0830             Response to Exercise   Blood Pressure (Admit) 128/72       Blood Pressure (Exercise) 160/82       Blood Pressure (Exit) 128/60       Heart Rate (Admit) 81 bpm       Heart Rate (Exercise) 120 bpm       Heart Rate (Exit) 74 bpm       Rating of Perceived Exertion (Exercise) 12       Symptoms none       Comments Reviewed MET's and goals       Duration Progress to 30 minutes of  aerobic without signs/symptoms of physical distress       Intensity THRR unchanged         Progression   Progression Continue to progress workloads to maintain intensity without signs/symptoms of physical distress.       Average METs 4.46         Resistance Training   Training Prescription Yes  Weight 3       Reps 10-15       Time 10 Minutes         Interval Training   Interval Training No         NuStep   Level 5       SPM 100       Minutes 15       METs 5.6         Track   Laps 15       Minutes 20       METs 3.32         Home Exercise Plan   Plans to continue exercise at Home (comment)       Frequency Add 2 additional days to program exercise sessions.       Initial Home Exercises Provided 08/04/21                Exercise Comments:   Exercise Comments     Row Name 07/21/21 1003 08/04/21 0830 08/18/21 0830 09/01/21 0830 09/22/21 0836   Exercise Comments Pt frist day in the CRP2 program. Pt tolerated exercise well with an average MET level of 2.17. Pt is learning his THRR, RPE and ExRx. Pt is off to a good start Reviewed MET's, goals and home ExRx. Pt tolerated exercise well with an average MET level of 2.99. Pt feels ok about his goals of gaining strength and coordination, but it is early on in the program. Pt states he is still working some and yardwork keeps him busy often. Encouranged pt to carve out time when possible to ensure he is able to live an active lifestyle going forward. Pt will exercise at home by  walking 1-2 days for 30-45 mins per session. Reviewed MET's, goals and home ExRx. Pt tolerated exercise well with an average MET level of 3.46. Pt states he is feeling better and is trying to gain a laps on the track and is increasing resistance on the Nustep often to gain strength. Encouraged pt that he is doing well. Reviewed MET's and goals. Pt tolerated exercise well with an average MET level of 3.55. Pt is feeling much better about his goals of gaining strength, energy and coordination. Reviewed MET's. Pt tolerated exercise well with an average MET level of 3.45. Pt feels good about his progress so far. Will continue to monitor pt and progress workloads as tolerated without sign or symptom    Row Name 10/01/21 0830           Exercise Comments Reviewed MET's and goals. Pt tolerated exercise well with an average MET level of 4.46. Pt is gaining strength coordination and energy. Pt feels good about his progress and is able to sustain longer times of activity without fatigue in his home life activities                Exercise Goals and Review:   Exercise Goals     Row Name 07/15/21 1457             Exercise Goals   Increase Physical Activity Yes       Intervention Provide advice, education, support and counseling about physical activity/exercise needs.;Develop an individualized exercise prescription for aerobic and resistive training based on initial evaluation findings, risk stratification, comorbidities and participant's personal goals.       Expected Outcomes Short Term: Attend rehab on a regular basis to increase amount of physical activity.;Long Term: Add in home exercise to make  exercise part of routine and to increase amount of physical activity.;Long Term: Exercising regularly at least 3-5 days a week.       Increase Strength and Stamina Yes       Intervention Provide advice, education, support and counseling about physical activity/exercise needs.;Develop an individualized  exercise prescription for aerobic and resistive training based on initial evaluation findings, risk stratification, comorbidities and participant's personal goals.       Expected Outcomes Short Term: Increase workloads from initial exercise prescription for resistance, speed, and METs.;Short Term: Perform resistance training exercises routinely during rehab and add in resistance training at home;Long Term: Improve cardiorespiratory fitness, muscular endurance and strength as measured by increased METs and functional capacity (6MWT)       Able to understand and use rate of perceived exertion (RPE) scale Yes       Intervention Provide education and explanation on how to use RPE scale       Expected Outcomes Short Term: Able to use RPE daily in rehab to express subjective intensity level;Long Term:  Able to use RPE to guide intensity level when exercising independently       Knowledge and understanding of Target Heart Rate Range (THRR) Yes       Intervention Provide education and explanation of THRR including how the numbers were predicted and where they are located for reference       Expected Outcomes Short Term: Able to state/look up THRR;Long Term: Able to use THRR to govern intensity when exercising independently;Short Term: Able to use daily as guideline for intensity in rehab       Understanding of Exercise Prescription Yes       Intervention Provide education, explanation, and written materials on patient's individual exercise prescription       Expected Outcomes Short Term: Able to explain program exercise prescription;Long Term: Able to explain home exercise prescription to exercise independently                Exercise Goals Re-Evaluation :  Exercise Goals Re-Evaluation     Row Name 07/21/21 0830 08/04/21 0830 09/01/21 0830 10/01/21 0830       Exercise Goal Re-Evaluation   Exercise Goals Review Increase Physical Activity;Increase Strength and Stamina;Able to understand and use rate  of perceived exertion (RPE) scale;Knowledge and understanding of Target Heart Rate Range (THRR);Understanding of Exercise Prescription Increase Physical Activity;Increase Strength and Stamina;Able to understand and use rate of perceived exertion (RPE) scale;Knowledge and understanding of Target Heart Rate Range (THRR);Understanding of Exercise Prescription Increase Physical Activity;Increase Strength and Stamina;Able to understand and use rate of perceived exertion (RPE) scale;Knowledge and understanding of Target Heart Rate Range (THRR);Understanding of Exercise Prescription Increase Physical Activity;Increase Strength and Stamina;Able to understand and use rate of perceived exertion (RPE) scale;Knowledge and understanding of Target Heart Rate Range (THRR);Understanding of Exercise Prescription    Comments Pt frist day in the CRP2 program. Pt tolerated exercise well with an average MET level of 2.17. Pt is learning his THRR, RPE and ExRx. Pt is off to a good start Reviewed MET's, goals and home ExRx. Pt tolerated exercise well with an average MET level of 2.99. Pt feels ok about his goals of gaining strength and coordination, but it is early on in the program. Pt states he is still working some and yardwork keeps him busy often. Encouranged pt to carve out time when possible to ensure he is able to live an active lifestyle going forward. Pt will exercise at home by walking  1-2 days for 30-45 mins per session. Reviewed MET's and goals. Pt tolerated exercise well with an average MET level of 3.55. Pt is feeling much better about his goals of gaining strength, energy and coordination. Reviewed MET's and goals. Pt tolerated exercise well with an average MET level of 4.46. Pt is gaining strength coordination and energy. Pt feels good about his progress and is able to sustain longer times of activity without fatigue in his home life activities    Expected Outcomes Will continue to monitor pt and progress workloads as  tolerated without sign or symptom Pt will continue to exercise on his own at home. Will continue to monitor pt and progress workloads as tolerated without sign or symptom Will continue to monitor pt and progress workloads as tolerated without sign or symptom Will continue to monitor pt and progress workloads as tolerated without sign or symptom             Discharge Exercise Prescription (Final Exercise Prescription Changes):  Exercise Prescription Changes - 10/01/21 0830       Response to Exercise   Blood Pressure (Admit) 128/72    Blood Pressure (Exercise) 160/82    Blood Pressure (Exit) 128/60    Heart Rate (Admit) 81 bpm    Heart Rate (Exercise) 120 bpm    Heart Rate (Exit) 74 bpm    Rating of Perceived Exertion (Exercise) 12    Symptoms none    Comments Reviewed MET's and goals    Duration Progress to 30 minutes of  aerobic without signs/symptoms of physical distress    Intensity THRR unchanged      Progression   Progression Continue to progress workloads to maintain intensity without signs/symptoms of physical distress.    Average METs 4.46      Resistance Training   Training Prescription Yes    Weight 3    Reps 10-15    Time 10 Minutes      Interval Training   Interval Training No      NuStep   Level 5    SPM 100    Minutes 15    METs 5.6      Track   Laps 15    Minutes 20    METs 3.32      Home Exercise Plan   Plans to continue exercise at Home (comment)    Frequency Add 2 additional days to program exercise sessions.    Initial Home Exercises Provided 08/04/21             Nutrition:  Target Goals: Understanding of nutrition guidelines, daily intake of sodium <1542m, cholesterol <2035m calories 30% from fat and 7% or less from saturated fats, daily to have 5 or more servings of fruits and vegetables.  Biometrics:  Pre Biometrics - 07/15/21 1450       Pre Biometrics   Waist Circumference 44.5 inches    Hip Circumference 41.75 inches     Waist to Hip Ratio 1.07 %    Triceps Skinfold 17 mm    % Body Fat 31.8 %    Grip Strength 37 kg    Flexibility 8 in    Single Leg Stand 30 seconds              Nutrition Therapy Plan and Nutrition Goals:  Nutrition Therapy & Goals - 09/24/21 0828       Nutrition Therapy   Diet Heart Healthy Diet    Drug/Food Interactions Statins/Certain Fruits  Personal Nutrition Goals   Nutrition Goal Patient to limit to 1549m of sodium per day   in progress   Personal Goal #2 Patient to build a healthy plate to include lean protein/plant protein, fruits, vegetables, whole grains, and low fat dairy products as part of a heart healthy meal plan   in progress   Comments Goals in progress. Patient continues to work on the consistency of dietary changes.      Intervention Plan   Intervention Prescribe, educate and counsel regarding individualized specific dietary modifications aiming towards targeted core components such as weight, hypertension, lipid management, diabetes, heart failure and other comorbidities.    Expected Outcomes Short Term Goal: Understand basic principles of dietary content, such as calories, fat, sodium, cholesterol and nutrients.;Long Term Goal: Adherence to prescribed nutrition plan.             Nutrition Assessments:  Nutrition Assessments - 07/21/21 0759       Rate Your Plate Scores   Pre Score 69            MEDIFICTS Score Key: ?70 Need to make dietary changes  40-70 Heart Healthy Diet ? 40 Therapeutic Level Cholesterol Diet   Flowsheet Row CARDIAC REHAB PHASE II EXERCISE from 07/21/2021 in [facility] Picture Your Plate Total Score on Admission 69      Picture Your Plate Scores: <<53Unhealthy dietary pattern with much room for improvement. 41-50 Dietary pattern unlikely to meet recommendations for good health and room for improvement. 51-60 More healthful dietary pattern, with some room for improvement.  >60  Healthy dietary pattern, although there may be some specific behaviors that could be improved.    Nutrition Goals Re-Evaluation:  Nutrition Goals Re-Evaluation     RPine IslandName 09/08/21 1012 09/24/21 0828           Goals   Current Weight -- 207 lb 7.3 oz (94.1 kg)      Comment -- Lipid lab panel is improved; HDL low at 35.      Expected Outcome Goals in progress. Patient continues to work on the consistency of dietary changes; he admits holidays, grandchildren, etc disrupt some of his food choices. His wife remains supportive of lifestyle/dietary changes. Lipid lab panel is improved; HDL low at 35. Goals in progress. Patient continues to work on the consistency of dietary changes toward a heart healthy diet. Weight stable.               Nutrition Goals Re-Evaluation:  Nutrition Goals Re-Evaluation     RBloomerName 09/08/21 1012 09/24/21 0828           Goals   Current Weight -- 207 lb 7.3 oz (94.1 kg)      Comment -- Lipid lab panel is improved; HDL low at 35.      Expected Outcome Goals in progress. Patient continues to work on the consistency of dietary changes; he admits holidays, grandchildren, etc disrupt some of his food choices. His wife remains supportive of lifestyle/dietary changes. Lipid lab panel is improved; HDL low at 35. Goals in progress. Patient continues to work on the consistency of dietary changes toward a heart healthy diet. Weight stable.               Nutrition Goals Discharge (Final Nutrition Goals Re-Evaluation):  Nutrition Goals Re-Evaluation - 09/24/21 0828       Goals   Current Weight 207 lb 7.3 oz (94.1 kg)    Comment  Lipid lab panel is improved; HDL low at 35.    Expected Outcome Goals in progress. Patient continues to work on the consistency of dietary changes toward a heart healthy diet. Weight stable.             Psychosocial: Target Goals: Acknowledge presence or absence of significant depression and/or stress, maximize coping skills,  provide positive support system. Participant is able to verbalize types and ability to use techniques and skills needed for reducing stress and depression.  Initial Review & Psychosocial Screening:  Initial Psych Review & Screening - 07/15/21 1325       Initial Review   Current issues with None Identified      Family Dynamics   Good Support System? Yes   Konrad Dolores has his wife and 4 children for support     Barriers   Psychosocial barriers to participate in program There are no identifiable barriers or psychosocial needs.      Screening Interventions   Interventions Encouraged to exercise             Quality of Life Scores:  Quality of Life - 07/15/21 1459       Quality of Life   Select Quality of Life      Quality of Life Scores   Health/Function Pre 24 %    Socioeconomic Pre 22.69 %    Psych/Spiritual Pre 22.5 %    Family Pre 26.8 %    GLOBAL Pre 23.8 %            Scores of 19 and below usually indicate a poorer quality of life in these areas.  A difference of  2-3 points is a clinically meaningful difference.  A difference of 2-3 points in the total score of the Quality of Life Index has been associated with significant improvement in overall quality of life, self-image, physical symptoms, and general health in studies assessing change in quality of life.  PHQ-9: Review Flowsheet  More data exists      07/15/2021 07/07/2021 05/08/2020 05/10/2019 05/04/2018  Depression screen PHQ 2/9  Decreased Interest 0 0 0 0 0  Down, Depressed, Hopeless 0 0 0 0 0  PHQ - 2 Score 0 0 0 0 0  Altered sleeping - - 0 - 0  Tired, decreased energy - - 0 - 0  Change in appetite - - 0 - 0  Feeling bad or failure about yourself  - - 0 - 0  Trouble concentrating - - 0 - 0  Moving slowly or fidgety/restless - - 0 - 0  Suicidal thoughts - - 0 - 0  PHQ-9 Score - - 0 - 0  Difficult doing work/chores - - Not difficult at all - Not difficult at all   Interpretation of Total Score  Total  Score Depression Severity:  1-4 = Minimal depression, 5-9 = Mild depression, 10-14 = Moderate depression, 15-19 = Moderately severe depression, 20-27 = Severe depression   Psychosocial Evaluation and Intervention:  Psychosocial Evaluation - 07/21/21 0754       Psychosocial Evaluation & Interventions   Interventions Encouraged to exercise with the program and follow exercise prescription    Comments Harmon denies any psychosocial barriers.  Feels supported by his wife for the adoption of heart healthy lifestyle    Expected Outcomes Kainen will remain free of any psychosocial issues as he continues heart healthy lifestyle    Continue Psychosocial Services  No Follow up required  Psychosocial Re-Evaluation:  Psychosocial Re-Evaluation     Row Name 07/21/21 0955 08/17/21 1544 09/03/21 1348 10/05/21 1559       Psychosocial Re-Evaluation   Current issues with None Identified None Identified -- None Identified    Comments Audrey expresses no Psychosocial barriers Aleph denies any psychosocial barriers and feels supported by family in making heart healthy lifestyle modifications Konrad Dolores has some concerns regarding his wife health.  She has had one knee done needs to have the other knee done but found out she has macular degneration.  He would like to go out more but is limited by her mobility. he does however feel supported. Konrad Dolores feels supported by his wife.  He is working on redoing the deck at his home so he can have family and friends over.  He is hopeful that this will motivate his wife to do and go out more. Litttle down about having to wait to have dental work completed but is coping.    Expected Outcomes Spence will continue to display positive healthy outlook Yaphet will continue to display positive healthy outlook Granite will continue to display positive healthy outlook Adekunle will continue to display positive healthy outlook    Interventions Encouraged to attend Cardiac  Rehabilitation for the exercise Encouraged to attend Cardiac Rehabilitation for the exercise Encouraged to attend Cardiac Rehabilitation for the exercise Encouraged to attend Cardiac Rehabilitation for the exercise    Continue Psychosocial Services  No Follow up required No Follow up required No Follow up required No Follow up required             Psychosocial Discharge (Final Psychosocial Re-Evaluation):  Psychosocial Re-Evaluation - 10/05/21 1559       Psychosocial Re-Evaluation   Current issues with None Identified    Comments Tommy feels supported by his wife.  He is working on redoing the deck at his home so he can have family and friends over.  He is hopeful that this will motivate his wife to do and go out more. Litttle down about having to wait to have dental work completed but is coping.    Expected Outcomes Mann will continue to display positive healthy outlook    Interventions Encouraged to attend Cardiac Rehabilitation for the exercise    Continue Psychosocial Services  No Follow up required             Vocational Rehabilitation: Provide vocational rehab assistance to qualifying candidates.   Vocational Rehab Evaluation & Intervention:  Vocational Rehab - 07/15/21 1326       Initial Vocational Rehab Evaluation & Intervention   Assessment shows need for Vocational Rehabilitation No   Tommy works part time and does not need vocational rehab at this time            Education: Education Goals: Education classes will be provided on a weekly basis, covering required topics. Participant will state understanding/return demonstration of topics presented.  Learning Barriers/Preferences:  Learning Barriers/Preferences - 07/15/21 1500       Learning Barriers/Preferences   Learning Barriers Sight    Learning Preferences Computer/Internet;Group Instruction;Pictoral;Written Material             Education Topics: Count Your Pulse:  -Group instruction  provided by verbal instruction, demonstration, patient participation and written materials to support subject.  Instructors address importance of being able to find your pulse and how to count your pulse when at home without a heart monitor.  Patients get hands on experience counting their pulse with staff  help and individually.   Heart Attack, Angina, and Risk Factor Modification:  -Group instruction provided by verbal instruction, video, and written materials to support subject.  Instructors address signs and symptoms of angina and heart attacks.    Also discuss risk factors for heart disease and how to make changes to improve heart health risk factors.   Functional Fitness:  -Group instruction provided by verbal instruction, demonstration, patient participation, and written materials to support subject.  Instructors address safety measures for doing things around the house.  Discuss how to get up and down off the floor, how to pick things up properly, how to safely get out of a chair without assistance, and balance training.   Meditation and Mindfulness:  -Group instruction provided by verbal instruction, patient participation, and written materials to support subject.  Instructor addresses importance of mindfulness and meditation practice to help reduce stress and improve awareness.  Instructor also leads participants through a meditation exercise.    Stretching for Flexibility and Mobility:  -Group instruction provided by verbal instruction, patient participation, and written materials to support subject.  Instructors lead participants through series of stretches that are designed to increase flexibility thus improving mobility.  These stretches are additional exercise for major muscle groups that are typically performed during regular warm up and cool down.   Hands Only CPR:  -Group verbal, video, and participation provides a basic overview of AHA guidelines for community CPR. Role-play of  emergencies allow participants the opportunity to practice calling for help and chest compression technique with discussion of AED use.   Hypertension: -Group verbal and written instruction that provides a basic overview of hypertension including the most recent diagnostic guidelines, risk factor reduction with self-care instructions and medication management.    Nutrition I class: Heart Healthy Eating:  -Group instruction provided by PowerPoint slides, verbal discussion, and written materials to support subject matter. The instructor gives an explanation and review of the Therapeutic Lifestyle Changes diet recommendations, which includes a discussion on lipid goals, dietary fat, sodium, fiber, plant stanol/sterol esters, sugar, and the components of a well-balanced, healthy diet.   Nutrition II class: Lifestyle Skills:  -Group instruction provided by PowerPoint slides, verbal discussion, and written materials to support subject matter. The instructor gives an explanation and review of label reading, grocery shopping for heart health, heart healthy recipe modifications, and ways to make healthier choices when eating out.   Diabetes Question & Answer:  -Group instruction provided by PowerPoint slides, verbal discussion, and written materials to support subject matter. The instructor gives an explanation and review of diabetes co-morbidities, pre- and post-prandial blood glucose goals, pre-exercise blood glucose goals, signs, symptoms, and treatment of hypoglycemia and hyperglycemia, and foot care basics.   Diabetes Blitz:  -Group instruction provided by PowerPoint slides, verbal discussion, and written materials to support subject matter. The instructor gives an explanation and review of the physiology behind type 1 and type 2 diabetes, diabetes medications and rational behind using different medications, pre- and post-prandial blood glucose recommendations and Hemoglobin A1c goals, diabetes  diet, and exercise including blood glucose guidelines for exercising safely.    Portion Distortion:  -Group instruction provided by PowerPoint slides, verbal discussion, written materials, and food models to support subject matter. The instructor gives an explanation of serving size versus portion size, changes in portions sizes over the last 20 years, and what consists of a serving from each food group.   Stress Management:  -Group instruction provided by verbal instruction, video, and written materials  to support subject matter.  Instructors review role of stress in heart disease and how to cope with stress positively.     Exercising on Your Own:  -Group instruction provided by verbal instruction, power point, and written materials to support subject.  Instructors discuss benefits of exercise, components of exercise, frequency and intensity of exercise, and end points for exercise.  Also discuss use of nitroglycerin and activating EMS.  Review options of places to exercise outside of rehab.  Review guidelines for sex with heart disease.   Cardiac Drugs I:  -Group instruction provided by verbal instruction and written materials to support subject.  Instructor reviews cardiac drug classes: antiplatelets, anticoagulants, beta blockers, and statins.  Instructor discusses reasons, side effects, and lifestyle considerations for each drug class.   Cardiac Drugs II:  -Group instruction provided by verbal instruction and written materials to support subject.  Instructor reviews cardiac drug classes: angiotensin converting enzyme inhibitors (ACE-I), angiotensin II receptor blockers (ARBs), nitrates, and calcium channel blockers.  Instructor discusses reasons, side effects, and lifestyle considerations for each drug class.   Anatomy and Physiology of the Circulatory System:  Group verbal and written instruction and models provide basic cardiac anatomy and physiology, with the coronary electrical and  arterial systems. Review of: AMI, Angina, Valve disease, Heart Failure, Peripheral Artery Disease, Cardiac Arrhythmia, Pacemakers, and the ICD.   Other Education:  -Group or individual verbal, written, or video instructions that support the educational goals of the cardiac rehab program.   Holiday Eating Survival Tips:  -Group instruction provided by PowerPoint slides, verbal discussion, and written materials to support subject matter. The instructor gives patients tips, tricks, and techniques to help them not only survive but enjoy the holidays despite the onslaught of food that accompanies the holidays.   Knowledge Questionnaire Score:  Knowledge Questionnaire Score - 07/15/21 1501       Knowledge Questionnaire Score   Pre Score 16/24             Core Components/Risk Factors/Patient Goals at Admission:  Personal Goals and Risk Factors at Admission - 07/15/21 1504       Core Components/Risk Factors/Patient Goals on Admission    Weight Management Yes    Intervention Weight Management: Develop a combined nutrition and exercise program designed to reach desired caloric intake, while maintaining appropriate intake of nutrient and fiber, sodium and fats, and appropriate energy expenditure required for the weight goal.;Weight Management: Provide education and appropriate resources to help participant work on and attain dietary goals.;Weight Management/Obesity: Establish reasonable short term and long term weight goals.;Obesity: Provide education and appropriate resources to help participant work on and attain dietary goals.    Expected Outcomes Short Term: Continue to assess and modify interventions until short term weight is achieved;Long Term: Adherence to nutrition and physical activity/exercise program aimed toward attainment of established weight goal;Weight Loss: Understanding of general recommendations for a balanced deficit meal plan, which promotes 1-2 lb weight loss per week and  includes a negative energy balance of [phone] kcal/d;Understanding recommendations for meals to include 15-35% energy as protein, 25-35% energy from fat, 35-60% energy from carbohydrates, less than 260m of dietary cholesterol, 20-35 gm of total fiber daily;Understanding of distribution of calorie intake throughout the day with the consumption of 4-5 meals/snacks    Hypertension Yes    Intervention Provide education on lifestyle modifcations including regular physical activity/exercise, weight management, moderate sodium restriction and increased consumption of fresh fruit, vegetables, and low fat dairy, alcohol moderation, and smoking cessation.;Monitor  prescription use compliance.    Expected Outcomes Short Term: Continued assessment and intervention until BP is < 140/80m HG in hypertensive participants. < 130/83mHG in hypertensive participants with diabetes, heart failure or chronic kidney disease.;Long Term: Maintenance of blood pressure at goal levels.    Lipids Yes    Intervention Provide education and support for participant on nutrition & aerobic/resistive exercise along with prescribed medications to achieve LDL <7050mHDL >27m38m  Expected Outcomes Short Term: Participant states understanding of desired cholesterol values and is compliant with medications prescribed. Participant is following exercise prescription and nutrition guidelines.;Long Term: Cholesterol controlled with medications as prescribed, with individualized exercise RX and with personalized nutrition plan. Value goals: LDL < 70mg30mL > 40 mg.    Personal Goal Other Yes    Personal Goal Long and short: coordination, energy and strength    Intervention Will continue to monitor pt and progress workloads as tolerated without sign or symptom    Expected Outcomes Pt will achieve his goals             Core Components/Risk Factors/Patient Goals Review:   Goals and Risk Factor Review     Row Name 07/21/21 0959 08/17/21  1545 09/03/21 1354 10/05/21 1600       Core Components/Risk Factors/Patient Goals Review   Personal Goals Review Weight Management/Obesity;Hypertension;Other Weight Management/Obesity;Hypertension;Other Weight Management/Obesity;Hypertension;Other Weight Management/Obesity;Hypertension;Other    Review ThomaDanyonted Cardiac Rehab on 5/10.  I anticipate for the 30 day review will see progress toward program and personal goals Tommy completed 8 cardiac rehab sessions.  tommy has good attendance and participates on Wednesdays and Fridays.  Vital signs remain within normal limits. Tommy desires to have more energy and strength. During this review, he has increased his MET level on the nustep along with increasing his laps. No significant weight loss during this 30 day review. Completed consultation with RD.   Hopeful that as he adopts heart healthy nutrition plan along with exercising outside of rehab will begin to see some progress toward his weight loss goal. TommyKonrad Doloresistently attends cardiac rehab 2 x week W/F.  Vital signs remain within normal limits however this particular week he is running a bit higher as his grandchildren are here visiting and he is eating kid friendly foods.  Tommy desires to have more energy and strength.  He averages MET level of 4.0 on the nustep with workload of 4 and typically ambulates 19-20 laps in 15 minutes TommyKonrad Dolorescompleted 21 exercise sessions and will graduate on 7/28.  Tommy maintains consistent exercise 2 x week.  Vital signs have returned to normal limits for him. Weight remains elevated from his grandchildren being around more during the summer.  He hopes this will even out soon.  TommyKonrad Doloress hard with exercise and feels his endurance and strenth have improved. Tommy walks at least 20 laps around the track and exercises on the nustep at level 5 with average MET of 5.6. TommyKonrad Dolores joy to have around.    Expected Outcomes ThomaJosephmichael continue to engage in Cardiac Rehab  for exercise, nutrition and adaptation of heart healthy lifestyle modifications. ThomaMarwan continue to engage in Cardiac Rehab for exercise, nutrition and adaptation of heart healthy lifestyle modifications. ThomaRanald continue to engage in Cardiac Rehab for exercise, nutrition and adaptation of heart healthy lifestyle modifications. TommyKonrad Dolores continue to engage in Cardiac Rehab for exercise, nutrition and adaptation of heart healthy lifestyle modifications.  Core Components/Risk Factors/Patient Goals at Discharge (Final Review):   Goals and Risk Factor Review - 10/05/21 1600       Core Components/Risk Factors/Patient Goals Review   Personal Goals Review Weight Management/Obesity;Hypertension;Other    Review Konrad Dolores has completed 21 exercise sessions and will graduate on 7/28.  Tommy maintains consistent exercise 2 x week.  Vital signs have returned to normal limits for him. Weight remains elevated from his grandchildren being around more during the summer.  He hopes this will even out soon.  Konrad Dolores works hard with exercise and feels his endurance and strenth have improved. Tommy walks at least 20 laps around the track and exercises on the nustep at level 5 with average MET of 5.6. Konrad Dolores is a joy to have around.    Expected Outcomes Tommy will continue to engage in Cardiac Rehab for exercise, nutrition and adaptation of heart healthy lifestyle modifications.             ITP Comments:  ITP Comments     Row Name 07/15/21 1324 07/21/21 1002 08/17/21 1543 09/03/21 1344 10/06/21 0835   ITP Comments Dr Fransico Him MD, Medical Director Marcello Moores started Cardiac Rehab on 5/10 30 day ITP Review 30 day ITP Review 30 day ITP Review anticipated graduation date 10/15/21            Comments: Pt is making expected progress toward personal goals after completing 21 sessions. Recommend continued exercise and life style modification education including  stress management and relaxation  techniques to decrease cardiac risk profile. Cherre Huger, BSN Cardiac and Training and development officer

## 2021-10-08 ENCOUNTER — Encounter (HOSPITAL_COMMUNITY)
Admission: RE | Admit: 2021-10-08 | Discharge: 2021-10-08 | Disposition: A | Discharge status: Home / Self Care | Payer: Medicare Other | Source: Ambulatory Visit | Attending: Cardiology | Admitting: Cardiology

## 2021-10-08 DIAGNOSIS — Z4889 Encounter for other specified surgical aftercare: Secondary | ICD-10-CM | POA: Diagnosis not present

## 2021-10-08 DIAGNOSIS — Z9889 Other specified postprocedural states: Secondary | ICD-10-CM

## 2021-10-08 DIAGNOSIS — Z952 Presence of prosthetic heart valve: Secondary | ICD-10-CM | POA: Diagnosis not present

## 2021-10-11 ENCOUNTER — Encounter (HOSPITAL_COMMUNITY): Payer: Medicare Other

## 2021-10-13 ENCOUNTER — Encounter (HOSPITAL_COMMUNITY)
Admission: RE | Admit: 2021-10-13 | Discharge: 2021-10-13 | Disposition: A | Discharge status: Home / Self Care | Payer: Medicare Other | Source: Ambulatory Visit | Attending: Cardiology | Admitting: Cardiology

## 2021-10-13 VITALS — Ht 68.0 in | Wt 205.7 lb

## 2021-10-13 DIAGNOSIS — Z9889 Other specified postprocedural states: Secondary | ICD-10-CM | POA: Diagnosis not present

## 2021-10-15 ENCOUNTER — Encounter (HOSPITAL_COMMUNITY)
Admission: RE | Admit: 2021-10-15 | Discharge: 2021-10-15 | Disposition: A | Discharge status: Home / Self Care | Payer: Medicare Other | Source: Ambulatory Visit | Attending: Cardiology | Admitting: Cardiology

## 2021-10-15 DIAGNOSIS — Z9889 Other specified postprocedural states: Secondary | ICD-10-CM

## 2021-11-01 ENCOUNTER — Ambulatory Visit: Payer: Medicare Other

## 2021-11-01 DIAGNOSIS — I5030 Unspecified diastolic (congestive) heart failure: Secondary | ICD-10-CM | POA: Diagnosis not present

## 2021-11-01 DIAGNOSIS — Z9889 Other specified postprocedural states: Secondary | ICD-10-CM | POA: Diagnosis not present

## 2021-11-05 ENCOUNTER — Other Ambulatory Visit: Payer: Self-pay

## 2021-11-05 MED ORDER — ATORVASTATIN CALCIUM 20 MG PO TABS: 20.0000 mg | ORAL_TABLET | Freq: Every day | ORAL | 3 refills | Status: DC

## 2021-11-18 DIAGNOSIS — D1801 Hemangioma of skin and subcutaneous tissue: Secondary | ICD-10-CM | POA: Diagnosis not present

## 2021-11-18 DIAGNOSIS — L578 Other skin changes due to chronic exposure to nonionizing radiation: Secondary | ICD-10-CM | POA: Diagnosis not present

## 2021-11-18 DIAGNOSIS — L570 Actinic keratosis: Secondary | ICD-10-CM | POA: Diagnosis not present

## 2021-11-18 DIAGNOSIS — L821 Other seborrheic keratosis: Secondary | ICD-10-CM | POA: Diagnosis not present

## 2021-11-18 DIAGNOSIS — Z86007 Personal history of in-situ neoplasm of skin: Secondary | ICD-10-CM | POA: Diagnosis not present

## 2021-11-18 DIAGNOSIS — L814 Other melanin hyperpigmentation: Secondary | ICD-10-CM | POA: Diagnosis not present

## 2021-11-25 ENCOUNTER — Ambulatory Visit: Payer: PPO | Admitting: Internal Medicine

## 2021-11-25 VITALS — BP 143/88 | HR 69 | Temp 97.8°F | Resp 16 | Ht 68.0 in | Wt 209.0 lb

## 2021-11-25 DIAGNOSIS — R0609 Other forms of dyspnea: Secondary | ICD-10-CM | POA: Insufficient documentation

## 2021-11-25 DIAGNOSIS — Z9889 Other specified postprocedural states: Secondary | ICD-10-CM | POA: Insufficient documentation

## 2021-11-25 DIAGNOSIS — I10 Essential (primary) hypertension: Secondary | ICD-10-CM | POA: Diagnosis not present

## 2021-11-25 NOTE — Progress Notes (Signed)
Primary Physician/Referring:  Ria Bush, MD  Patient ID: Ryan Mathews, male    DOB: 06-Oct-1945, 76 y.o.   MRN: [id number]  No chief complaint on file.  HPI:    Ryan Mathews  is a 76 y.o. Caucasian male with history of smoking and COPD, hypertension.  Patient presented to PCPs office 01/25/2021 with exertional dyspnea, PCP subsequently ordered echocardiogram which revealed hyperdynamic LV with LVEF of 94-50%, grade 2 diastolic dysfunction and severe mitral valve regurgitation with flail middle scallop of the posterior mitral leaflet concerning for chordal rupture. Patient was therefore referred to our office for urgent evaluation and management.    Patient subsequently underwent TEE and left/right heart catheterization, details below.  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 Einar Gip sent referral to [facility] cardiothoracic surgery for consideration of mitral valve repair/replacement.  Patient underwent mitral valve repair at [facility] by Dr. Cheree Ditto on 04/29/2021.   Patient now presents for follow up with our office. He is doing very well. No chest pain, palpitations, diaphoresis, shortness of breath, syncope. He is active and currently re-doing the siding on his house without any symptoms whatsoever. Echo reviewed, however no symptoms so will continue to follow and repeat echo again in 6 months.  Past Medical History:  Diagnosis Date   Bilateral inguinal hernia (BIH) s/p lap repair 09/06/2012 08/09/2012   Cataract    CMC arthritis, thumb, degenerative    right   COPD (chronic obstructive pulmonary disease) (HCC)    Emphysema of lung (HCC)    Ex-smoker    GERD (gastroesophageal reflux disease)    History of chicken pox    History of pneumonia 08/06/2009        Hyperlipidemia    Hypertension    Nasal septal deviation 04/14/2017   Marked   Numbness    R cheek stays numb   Obesity (BMI 30-39.9) 08/09/2012   Past Surgical  History:  Procedure Laterality Date   ANKLE SURGERY Left 1966   with tendon repair with stainless steel wire   CARDIAC CATHETERIZATION     COLONOSCOPY  07/2017   multiple tubular adenomas, rpt 3 yrs (Pyrtle)   INSERTION OF MESH Bilateral 09/06/2012   Procedure: INSERTION OF MESH;  Surgeon: Adin Hector, MD   LAPAROSCOPIC INGUINAL HERNIA WITH UMBILICAL HERNIA Bilateral 09/06/2012   Procedure: LAPAROSCOPIC exploration and repair of hernias in bellybutton and bilateral groins with mesh;  Surgeon: Adin Hector, MD   MICRODISCECTOMY LUMBAR Right 08/2020   L4/5 (Dr Zada Finders)   [place]  04/29/2021   at [place] Dr Cheree Ditto   RIGHT/LEFT [place] N/A 02/16/2021   Procedure: RIGHT/LEFT HEART CATH AND CORONARY ANGIOGRAPHY;  Surgeon: Adrian Prows, MD;  Location: [place] CV LAB;  Service: Cardiovascular;  Laterality: N/A;   TEE WITHOUT CARDIOVERSION N/A 02/12/2021   Procedure: TRANSESOPHAGEAL ECHOCARDIOGRAM (TEE);  Surgeon: Adrian Prows, MD;  Location: [facility] ENDOSCOPY;  Service: Cardiovascular;  Laterality: N/A;   Family History  Problem Relation Age of Onset   Cancer Mother        breast   Congenital heart disease Mother    Diabetes Mother    COPD Father    Cancer Sister        lung (minimal smoker)   Colon cancer Neg Hx    Esophageal cancer Neg Hx    Liver cancer Neg Hx    Rectal cancer Neg Hx    Stomach cancer Neg  Hx    Pancreatic cancer Neg Hx     Social History   Tobacco Use   Smoking status: Former    Packs/day: 2.00    Years: 40.00    Total pack years: 80.00    Types: Cigarettes    Start date: 03/14/1968    Quit date: 03/14/2006    Years since quitting: 15.7   Smokeless tobacco: Never  Substance Use Topics   Alcohol use: Yes    Comment: Occasionally   Marital Status: Married   ROS  Review of Systems  Cardiovascular:  Negative for chest pain, dyspnea on exertion, leg swelling, near-syncope, orthopnea, palpitations and paroxysmal  nocturnal dyspnea.  Gastrointestinal:  Negative for melena.   Objective  Blood pressure (!) 143/88, pulse 69, temperature 97.8 F (36.6 C), temperature source Temporal, resp. rate 16, height '5\' 8"'$  (1.727 m), weight 209 lb (94.8 kg), SpO2 98 %.     11/25/2021    8:31 AM 10/13/2021    8:40 AM 07/15/2021    2:50 PM  Vitals with BMI  Height '5\' 8"'$  '5\' 8"'$  '5\' 8"'$   Weight 209 lbs 205 lbs 11 oz 206 lbs 13 oz  BMI 31.79 96.78 93.81  Systolic 017  510  Diastolic 88  70  Pulse 69  86     Physical Exam Vitals and nursing note reviewed.  Constitutional:      Appearance: Normal appearance.  HENT:     Head: Normocephalic and atraumatic.  Neck:     Vascular: No carotid bruit or JVD.  Cardiovascular:     Rate and Rhythm: Normal rate and regular rhythm.     Pulses: Normal pulses and intact distal pulses.     Heart sounds: Normal heart sounds.     No gallop.  Pulmonary:     Effort: Pulmonary effort is normal.     Breath sounds: Normal breath sounds. No rales.  Musculoskeletal:     Right lower leg: No edema.     Left lower leg: No edema.  Neurological:     Mental Status: He is alert.   Laboratory examination:   Recent Labs    02/08/21 0908 02/16/21 1133 02/16/21 1147 03/15/21 2158 07/07/21 0740  NA 136   < > 138 134* 141  K 4.4   < > 4.6 4.0 4.5  CL 98  --   --  103 107  CO2 23  --   --  22 27  GLUCOSE 112*  --   --  166* 97  BUN 25  --   --  27* 19  CREATININE 1.24  --   --  1.23 1.03  CALCIUM 9.9  --   --  9.4 9.0  GFRNONAA  --   --   --  >60  --    < > = values in this interval not displayed.    CrCl cannot be calculated (Patient's most recent lab result is older than the maximum 21 days allowed.).     Latest Ref Rng & Units 07/07/2021    7:40 AM 03/15/2021    9:58 PM 02/16/2021   11:47 AM  CMP  Glucose 70 - 99 mg/dL 97  166    BUN 6 - 23 mg/dL 19  27    Creatinine 0.40 - 1.50 mg/dL 1.03  1.23    Sodium 135 - 145 mEq/L 141  134  138   Potassium 3.5 - 5.1 mEq/L 4.5  4.0   4.6   Chloride 96 -  112 mEq/L 107  103    CO2 19 - 32 mEq/L 27  22    Calcium 8.4 - 10.5 mg/dL 9.0  9.4    Total Protein 6.0 - 8.3 g/dL 6.8     Total Bilirubin 0.2 - 1.2 mg/dL 0.3     Alkaline Phos 39 - 117 U/L 87     AST 0 - 37 U/L 20     ALT 0 - 53 U/L 15         Latest Ref Rng & Units 03/15/2021    9:58 PM 02/16/2021   11:47 AM 02/16/2021   11:42 AM  CBC  WBC 4.0 - 10.5 K/uL 15.2     Hemoglobin 13.0 - 17.0 g/dL 16.3  16.3  16.7   Hematocrit 39.0 - 52.0 % 46.7  48.0  49.0   Platelets 150 - 400 K/uL 207       Lipid Panel Recent Labs    07/07/21 0740  CHOL 94  TRIG 114.0  LDLCALC 37  VLDL 22.8  HDL 35.10*  CHOLHDL 3     HEMOGLOBIN A1C No results found for: "HGBA1C", "MPG" TSH Recent Labs    01/25/21 1300  TSH 3.91     External labs:  None   Allergies  No Known Allergies   Medications Prior to Visit:   Outpatient Medications Prior to Visit  Medication Sig Dispense Refill   Ascorbic Acid 500 MG CHEW Chew 1 tablet by mouth daily.     aspirin 81 MG chewable tablet Chew by mouth.     atorvastatin (LIPITOR) 20 MG tablet Take 1 tablet (20 mg total) by mouth daily. 30 tablet 3   b complex vitamins tablet Take 1 tablet by mouth daily.     Omega-3 Fatty Acids (FISH OIL) 1000 MG CAPS Take 1 capsule (1,000 mg total) by mouth daily.     Omeprazole-Sodium Bicarbonate (ZEGERID OTC) 20-1100 MG CAPS capsule Take 1 capsule by mouth daily before breakfast. (Patient taking differently: Take 1 capsule by mouth daily as needed (Heartburn).) 28 each 0   No facility-administered medications prior to visit.   Final Medications at End of Visit    Current Meds  Medication Sig   Ascorbic Acid 500 MG CHEW Chew 1 tablet by mouth daily.   aspirin 81 MG chewable tablet Chew by mouth.   atorvastatin (LIPITOR) 20 MG tablet Take 1 tablet (20 mg total) by mouth daily.   b complex vitamins tablet Take 1 tablet by mouth daily.   Omega-3 Fatty Acids (FISH OIL) 1000 MG CAPS Take 1 capsule  (1,000 mg total) by mouth daily.   Omeprazole-Sodium Bicarbonate (ZEGERID OTC) 20-1100 MG CAPS capsule Take 1 capsule by mouth daily before breakfast. (Patient taking differently: Take 1 capsule by mouth daily as needed (Heartburn).)   Radiology:   No results found.  Chest x-ray 01/25/2021: 1. Dramatic change in the appearance of the lungs, which could be indicative of interstitial lung disease, although lung volumes are normal. Accordingly, acute atypical infection is not excluded. Further clinical evaluation is recommended. Consideration for follow-up high-resolution chest CT is suggested if clinically appropriate. 2. Aortic atherosclerosis  Cardiac Studies:   Echocardiogram 11/01/2021:  Normal LV systolic function with visual EF 55-60%. Left ventricle cavity  is normal in size. Mild concentric hypertrophy of the left ventricle.  Normal global wall motion. Doppler evidence of grade II (pseudonormal)  diastolic dysfunction, elevated LAP. Calculated EF 59%.  Left atrial cavity is mildly dilated.  Right atrial cavity  is mildly dilated.  Right ventricle cavity is mildly dilated. Normal right ventricular  function.  Structurally normal trileaflet aortic valve.  Trace aortic regurgitation.  Moderate to severe mitral regurgitation. Mitral valve ring repair of the  mitral annulus. E-wave dominant mitral inflow.  Structurally normal tricuspid valve.  Moderate tricuspid regurgitation.  Moderate to severe pulmonary hypertension. RVSP measures 52 mmHg.    Echocardiogram 05/03/2021:   NORMAL LEFT VENTRICULAR SYSTOLIC FUNCTION WITH MILD LVH    NORMAL RIGHT VENTRICULAR SYSTOLIC FUNCTION    VALVULAR REGURGITATION: TRIVIAL MR, MILD TR    PROSTHETIC VALVE(S): PROSTHETIC MV RING    POOR SOUND TRANSMISSION; DEFINITY USED TO HELP ENHANCE ENDOCARDIAL    BORDERS.    S/P MV REPAIR (04/29/2021). No significant gradient across the valve and no    significant regurgitation   Mitral valve repair  04/29/2021: By Dr. Cheree Ditto at [facility] MV repair (36 mm Simulus semi rigid band) via Heartport ADULT, VALVULOPLASTY, MITRAL VALVE, VIA HEARTPORT, WITH CARDIOPULMONARY BYPASS; RADICAL RECONSTRUCTION, WITH OR WITHOUT RING. REPLACEMENT MITRAL VALVE VIA HEART PORT N/A 04/29/2021   Right and left heart catheterization 02/16/2021: RA 5/5, mean 2 mmHg, RA saturation 70%. RV 32/5, EDP 8 mmHg. PA 34/5, mean 18 mmHg.  PA saturation 69%. PW 7/23, mean 12 mmHg.  Giant V waves evident suggestive of severe MR.  Aortic saturation 100%. LV 122/-1, EDP 8 mmHg.  Ao 109/58, mean 75 mmHg.  No pressure gradient across the aortic valve. LM: Large vessel, mid to distal segment is mildly ectatic. LAD: Large vessel, gives origin to small diagonals, mild disease evident. LCx: Moderate caliber vessel, minimal disease. RI: Moderate caliber vessel.  Minimal ostial disease. RCA: Large-caliber vessel, dominant.  Minimal disease present.  Impression and recommendation: Patient will need mitral valve repair.  50 mL contrast utilized.  No immediate complications.  TEE 02/12/2021:   1. Left ventricular ejection fraction, by estimation, is 60 to 65%. The left ventricle has normal function. The left ventricle has no regional wall motion abnormalities.   2. Right ventricular systolic function is normal. The right ventricular size is normal.   3. Left atrial size was moderately dilated. No left atrial/left atrial appendage thrombus was detected.   4. The mitral valve is myxomatous. Severe mitral valve regurgitation. No evidence of mitral stenosis. There is severe holosystolic prolapse of multiple scallops of the posterior leaflet of the mitral valve.   5. The aortic valve is normal in structure. Aortic valve regurgitation is not visualized. No aortic stenosis is present.   6. There is mild (Grade II) plaque involving the aortic arch and descending aorta.   7. There is reversal of flow in both upper and lower right  pulmonary veins and left upper pulmonary vein.   Carotid artery duplex  02/09/2021:  The bifurcation, internal, external and common carotid arteries reveal no  evidence of significant stenosis, bilaterally.  Antegrade right vertebral artery flow. Antegrade left vertebral artery  flow.  Echocardiogram 01/26/2021:  1. Left ventricular ejection fraction, by estimation, is 70 to 75%. The left ventricle has hyperdynamic function. The left ventricle has no regional wall motion abnormalities. Left ventricular diastolic parameters are consistent with Grade II diastolic dysfunction (pseudonormalization).   2. Right ventricular systolic function is normal. The right ventricular size is normal. There is severely elevated pulmonary artery systolic pressure.   3. Left atrial size was moderately dilated.   4. Suspect flail middle scallop of the posterior mitral leaflet due to chordal rupture. The mitral valve  is myxomatous. Severe mitral valve regurgitation. No evidence of mitral stenosis.   5. Tricuspid valve regurgitation is mild to moderate.   6. The aortic valve is tricuspid. Aortic valve regurgitation is not visualized. No aortic stenosis is present.   EKG:  05/31/2021: Sinus rhythm rate of 94 bpm.  Left axis, left anterior fascicular block.  Incomplete right bundle branch block.  Nonspecific T wave abnormality.  No evidence of ischemia or underlying injury pattern.  Compared EKG 02/01/2021, no significant change.  Assessment     ICD-10-CM   1. Primary hypertension  I10 EKG 12-Lead       There are no discontinued medications.    No orders of the defined types were placed in this encounter.    Recommendations:   Ryan Mathews is a 76 y.o. hypertension, obesity referred to me on an urgent basis for evaluation of mitral regurgitation and heart failure.  Continue current cardiac medications. Encourage low-sodium diet, less than 2000 mg daily. Reviewed recent echocardiogram with patient,  will continue to monitor as he is asymptomatic with heavy exertion. Schedule imaging tests in office - repeat echo in 6 months prior to next visit. Follow-up in 6 months or sooner if needed.    Floydene Flock, DO, [facility] 11/25/2021, 8:44 AM Office: [phone]

## 2021-12-01 ENCOUNTER — Ambulatory Visit: Payer: PPO | Admitting: Student

## 2022-02-02 ENCOUNTER — Other Ambulatory Visit: Payer: Self-pay | Admitting: Cardiology

## 2022-03-16 ENCOUNTER — Encounter: Payer: Self-pay | Admitting: Family Medicine

## 2022-03-16 ENCOUNTER — Ambulatory Visit (INDEPENDENT_AMBULATORY_CARE_PROVIDER_SITE_OTHER): Payer: PPO | Admitting: Family Medicine

## 2022-03-16 VITALS — BP 148/78 | HR 76 | Temp 98.0°F | Ht 68.0 in | Wt 210.2 lb

## 2022-03-16 DIAGNOSIS — J029 Acute pharyngitis, unspecified: Secondary | ICD-10-CM

## 2022-03-16 DIAGNOSIS — J020 Streptococcal pharyngitis: Secondary | ICD-10-CM | POA: Insufficient documentation

## 2022-03-16 LAB — POCT RAPID STREP A (OFFICE): Rapid Strep A Screen: POSITIVE — AB

## 2022-03-16 MED ORDER — AMOXICILLIN 500 MG PO CAPS: 500.0000 mg | ORAL_CAPSULE | Freq: Two times a day (BID) | ORAL | 0 refills | Status: DC

## 2022-03-16 NOTE — Progress Notes (Signed)
Subjective:    Patient ID: Ryan Mathews, male    DOB: December 28, 1945, 77 y.o.   MRN: [id number]  HPI 77 yo pt of Dr Darnell Level presents for sore throat   Wt Readings from Last 3 Encounters:  03/16/22 210 lb 4 oz (95.4 kg)  11/25/21 209 lb (94.8 kg)  10/13/21 205 lb 11 oz (93.3 kg)   31.97 kg/m  Vitals:   03/16/22 1021  BP: (!) 148/78  Pulse: 76  Temp: 98 F (36.7 C)  TempSrc: Temporal  SpO2: 97%  Weight: 210 lb 4 oz (95.4 kg)  Height: '5\' 8"'$  (1.727 m)   Symptoms for 4-5 d  Sore throat - is tolerable , worse at the day goes on  Hurts to swallow  Cannot drink his coffee because it hurts  Drinking water  Even hurts to gargles   Tested for covid last sat -neg   Some runny nose  Had a little cough (happens at night) - thinks it may be from throat  Usually dry   No fever  No rash    Otc Cough drops  Tylenol    Results for orders placed or performed in visit on 03/16/22  Rapid Strep A  Result Value Ref Range   Rapid Strep A Screen Positive (A) Negative    Patient Active Problem List   Diagnosis Date Noted   Strep throat 03/16/2022   S/P mitral valve repair 11/25/2021   Dyspnea on exertion 11/25/2021   Carcinoma in situ of skin of right lower limb, including hip 07/06/2021   Displacement of lumbar intervertebral disc 07/06/2021   Lumbar radiculopathy 07/06/2021   Neoplasm of uncertain behavior of skin 07/06/2021   Rosacea 07/06/2021   Squamous cell carcinoma in situ 07/06/2021   S/P MVR (mitral valve repair) 04/30/2021   Mitral regurgitation 04/29/2021   CHF (congestive heart failure), NYHA class II, chronic, diastolic ([place]) 40/98/1191   Exertional dyspnea 01/25/2021   Acute mitral regurgitation from chordal rupture ([place]) 01/25/2021   Acute midline low back pain with right-sided sciatica 08/14/2020   Hypertensive retinopathy of both eyes 07/17/2020   Acute left-sided low back pain with left-sided sciatica 04/24/2020   Hypertension 09/09/2019   Tinea pedis  05/04/2018   Thoracic aorta atherosclerosis ([place]) 05/13/2017   Hepatic steatosis 05/13/2017   Medicare annual wellness visit, subsequent 04/21/2017   Advanced care planning/counseling discussion 04/21/2017   Dyslipidemia 04/21/2017   Nasal septal deviation 04/14/2017   Vitreous floaters of left eye 12/22/2015   Osteoarthritis of CMC joint of thumb 09/17/2015   Chronic leg pain 08/07/2015   Ex-smoker    GERD (gastroesophageal reflux disease)    Obesity, Class I, BMI 30-34.9 08/09/2012   Diastasis recti 08/09/2012   Bullous emphysema ([place]) 08/05/2009   Past Medical History:  Diagnosis Date   Bilateral inguinal hernia (BIH) s/p lap repair 09/06/2012 08/09/2012   Cataract    CMC arthritis, thumb, degenerative    right   COPD (chronic obstructive pulmonary disease) ([place])    Emphysema of lung ([place])    Ex-smoker    GERD (gastroesophageal reflux disease)    History of chicken pox    History of pneumonia 08/06/2009        Hyperlipidemia    Hypertension    Nasal septal deviation 04/14/2017   Marked   Numbness    R cheek stays numb   Obesity (BMI 30-39.9) 08/09/2012   Past Surgical History:  Procedure Laterality Date   ANKLE SURGERY Left 1966  with tendon repair with stainless steel wire   CARDIAC CATHETERIZATION     COLONOSCOPY  07/2017   multiple tubular adenomas, rpt 3 yrs ([place]le)   INSERTION OF MESH Bilateral 09/06/2012   Procedure: INSERTION OF MESH;  Surgeon: Adin Hector, MD   LAPAROSCOPIC INGUINAL HERNIA WITH UMBILICAL HERNIA Bilateral 09/06/2012   Procedure: LAPAROSCOPIC exploration and repair of hernias in bellybutton and bilateral groins with mesh;  Surgeon: Adin Hector, MD   MICRODISCECTOMY LUMBAR Right 08/2020   L4/5 (Dr Zada Finders)   [place]  04/29/2021   at [place] Dr Cheree Ditto   RIGHT/LEFT [place] N/A 02/16/2021   Procedure: RIGHT/LEFT HEART CATH AND CORONARY ANGIOGRAPHY;  Surgeon: Adrian Prows, MD;  Location: [place]  CV LAB;  Service: Cardiovascular;  Laterality: N/A;   TEE WITHOUT CARDIOVERSION N/A 02/12/2021   Procedure: TRANSESOPHAGEAL ECHOCARDIOGRAM (TEE);  Surgeon: Adrian Prows, MD;  Location: [facility] ENDOSCOPY;  Service: Cardiovascular;  Laterality: N/A;   Social History   Tobacco Use   Smoking status: Former    Packs/day: 2.00    Years: 40.00    Total pack years: 80.00    Types: Cigarettes    Start date: 03/14/1968    Quit date: 03/14/2006    Years since quitting: 16.0   Smokeless tobacco: Never  Vaping Use   Vaping Use: Never used  Substance Use Topics   Alcohol use: Yes    Comment: Occasionally   Drug use: No   Family History  Problem Relation Age of Onset   Cancer Mother        breast   Congenital heart disease Mother    Diabetes Mother    COPD Father    Cancer Sister        lung (minimal smoker)   Colon cancer Neg Hx    Esophageal cancer Neg Hx    Liver cancer Neg Hx    Rectal cancer Neg Hx    Stomach cancer Neg Hx    Pancreatic cancer Neg Hx    No Known Allergies Current Outpatient Medications on File Prior to Visit  Medication Sig Dispense Refill   Ascorbic Acid 500 MG CHEW Chew 1 tablet by mouth daily.     aspirin 81 MG chewable tablet Chew by mouth.     atorvastatin (LIPITOR) 20 MG tablet TAKE 1 TABLET BY MOUTH EVERY DAY 90 tablet 1   b complex vitamins tablet Take 1 tablet by mouth daily.     Omega-3 Fatty Acids (FISH OIL) 1000 MG CAPS Take 1 capsule (1,000 mg total) by mouth daily.     Omeprazole-Sodium Bicarbonate (ZEGERID OTC) 20-1100 MG CAPS capsule Take 1 capsule by mouth daily before breakfast. (Patient taking differently: Take 1 capsule by mouth daily as needed (Heartburn).) 28 each 0   No current facility-administered medications on file prior to visit.     Review of Systems  Constitutional:  Positive for fatigue. Negative for activity change, appetite change, fever and unexpected weight change.  HENT:  Positive for postnasal drip, sore throat and voice change.  Negative for congestion, rhinorrhea and trouble swallowing.   Eyes:  Negative for pain, redness, itching and visual disturbance.  Respiratory:  Positive for cough. Negative for chest tightness, shortness of breath and wheezing.   Cardiovascular:  Negative for chest pain and palpitations.  Gastrointestinal:  Negative for abdominal pain, blood in stool, constipation, diarrhea and nausea.  Endocrine: Negative for cold intolerance, heat intolerance, polydipsia and polyuria.  Genitourinary:  Negative  for difficulty urinating, dysuria, frequency and urgency.  Musculoskeletal:  Negative for arthralgias, joint swelling and myalgias.  Skin:  Negative for pallor and rash.  Neurological:  Negative for dizziness, tremors, weakness, numbness and headaches.  Hematological:  Negative for adenopathy. Does not bruise/bleed easily.  Psychiatric/Behavioral:  Negative for decreased concentration and dysphoric mood. The patient is not nervous/anxious.        Objective:   Physical Exam Constitutional:      General: He is not in acute distress.    Appearance: He is well-developed. He is obese. He is not ill-appearing.  HENT:     Head: Normocephalic and atraumatic.     Right Ear: Tympanic membrane and ear canal Ryan.     Left Ear: Tympanic membrane and ear canal Ryan.     Nose:     Comments: Buggy nares     Mouth/Throat:     Mouth: Mucous membranes are moist. No oral lesions.     Pharynx: Uvula midline. Posterior oropharyngeal erythema present. No pharyngeal swelling, oropharyngeal exudate or uvula swelling.     Tonsils: No tonsillar exudate or tonsillar abscesses.  Eyes:     Conjunctiva/sclera: Conjunctivae Ryan.  Cardiovascular:     Rate and Rhythm: Ryan rate and regular rhythm.  Pulmonary:     Effort: Pulmonary effort is Ryan. No respiratory distress.     Breath sounds: No stridor. No wheezing, rhonchi or rales.  Musculoskeletal:     Cervical back: Neck supple.  Lymphadenopathy:      Cervical: No cervical adenopathy.  Skin:    General: Skin is warm and dry.     Findings: No rash.  Neurological:     Mental Status: He is alert.     Cranial Nerves: Cranial nerves 2-12 are intact.           Assessment & Plan:   Problem List Items Addressed This Visit       Respiratory   Strep throat    With ST (no fever or rash) Had some uri sympt prior- improving  Reassuring exam   Px amox 500 mg bid for 10 d  Fluids/rest/sympt care Can try salt water gargle if tol Chloraseptic prn  Tylenol  ER precautions noted (if cannot swallow) Update if not starting to improve in a week or if worsening        Other Visit Diagnoses     Sore throat    -  Primary   Relevant Orders   Rapid Strep A (Completed)

## 2022-03-16 NOTE — Assessment & Plan Note (Signed)
With ST (no fever or rash) Had some uri sympt prior- improving  Reassuring exam   Px amox 500 mg bid for 10 d  Fluids/rest/sympt care Can try salt water gargle if tol Chloraseptic prn  Tylenol  ER precautions noted (if cannot swallow) Update if not starting to improve in a week or if worsening

## 2022-03-16 NOTE — Patient Instructions (Addendum)
Take tylenol for sore throat Sip fluids  Get rest when you can  Take the amoxicillin for strep throat If any problems let us know  A salt water gargle may help at home also if you can tolerate it  Over the counter chloraseptic products for sore throat also  If your sore throat gets so bad that you cannot swallow please go to the ER   Update if not starting to improve in a week or if worsening

## 2022-03-23 ENCOUNTER — Encounter: Payer: Self-pay | Admitting: Internal Medicine

## 2022-04-21 ENCOUNTER — Ambulatory Visit (AMBULATORY_SURGERY_CENTER): Payer: PPO | Admitting: *Deleted

## 2022-04-21 VITALS — Ht 69.0 in | Wt 205.0 lb

## 2022-04-21 DIAGNOSIS — Z86010 Personal history of colonic polyps: Secondary | ICD-10-CM

## 2022-04-21 MED ORDER — NA SULFATE-K SULFATE-MG SULF 17.5-3.13-1.6 GM/177ML PO SOLN: 1.0000 | Freq: Once | ORAL | 0 refills | Status: AC

## 2022-04-21 NOTE — Progress Notes (Signed)
No egg or soy allergy known to patient  No issues known to pt with past sedation with any surgeries or procedures Patient denies ever being told they had issues or difficulty with intubation  No FH of Malignant Hyperthermia Pt is not on diet pills Pt is not on  home 02  Pt is not on blood thinners  Pt denies issues with constipation  Pt is not on dialysis Pt denies any upcoming cardiac testing Pt encouraged to use to use Singlecare or Goodrx to reduce cost  Patient's chart reviewed by John Nulty CNRA prior to previsit and patient appropriate for the [facility].  Previsit completed and red dot placed by patient's name on their procedure day (on provider's schedule).  . Visit by phone Instructions reviewed with pt and pt states understanding. Instructed to review again prior to procedure. Pt states they will.  

## 2022-04-29 ENCOUNTER — Encounter: Payer: Self-pay | Admitting: Internal Medicine

## 2022-05-12 ENCOUNTER — Ambulatory Visit (AMBULATORY_SURGERY_CENTER): Payer: PPO | Admitting: Internal Medicine

## 2022-05-12 ENCOUNTER — Encounter: Payer: Self-pay | Admitting: Internal Medicine

## 2022-05-12 VITALS — BP 113/47 | HR 56 | Temp 96.8°F | Resp 10 | Ht 69.0 in | Wt 205.0 lb

## 2022-05-12 DIAGNOSIS — E669 Obesity, unspecified: Secondary | ICD-10-CM | POA: Diagnosis not present

## 2022-05-12 DIAGNOSIS — I10 Essential (primary) hypertension: Secondary | ICD-10-CM | POA: Diagnosis not present

## 2022-05-12 DIAGNOSIS — D128 Benign neoplasm of rectum: Secondary | ICD-10-CM | POA: Diagnosis not present

## 2022-05-12 DIAGNOSIS — Z86010 Personal history of colonic polyps: Secondary | ICD-10-CM | POA: Diagnosis not present

## 2022-05-12 DIAGNOSIS — Z09 Encounter for follow-up examination after completed treatment for conditions other than malignant neoplasm: Secondary | ICD-10-CM | POA: Diagnosis not present

## 2022-05-12 DIAGNOSIS — D122 Benign neoplasm of ascending colon: Secondary | ICD-10-CM

## 2022-05-12 DIAGNOSIS — D123 Benign neoplasm of transverse colon: Secondary | ICD-10-CM | POA: Diagnosis not present

## 2022-05-12 DIAGNOSIS — E785 Hyperlipidemia, unspecified: Secondary | ICD-10-CM | POA: Diagnosis not present

## 2022-05-12 DIAGNOSIS — J439 Emphysema, unspecified: Secondary | ICD-10-CM | POA: Diagnosis not present

## 2022-05-12 MED ORDER — SODIUM CHLORIDE 0.9 % IV SOLN: 500.0000 mL | Freq: Once | INTRAVENOUS | Status: DC

## 2022-05-12 NOTE — Op Note (Signed)
[facility] Patient Name: Ryan Mathews Procedure Date: 05/12/2022 1:55 PM MRN: [id number] Endoscopist: Jerene Bears , MD, [id number] Age: 77 Referring MD:  Date of Birth: [birth year] Gender: Male Account #: [id number] Procedure:                Colonoscopy Indications:              High risk colon cancer surveillance: Personal                            history of multiple adenomas, Last colonoscopy: May                            2019 (6 adenomas removed) Medicines:                Monitored Anesthesia Care Procedure:                Pre-Anesthesia Assessment:                           - Prior to the procedure, a History and Physical                            was performed, and patient medications and                            allergies were reviewed. The patient's tolerance of                            previous anesthesia was also reviewed. The risks                            and benefits of the procedure and the sedation                            options and risks were discussed with the patient.                            All questions were answered, and informed consent                            was obtained. Prior Anticoagulants: The patient has                            taken no anticoagulant or antiplatelet agents. ASA                            Grade Assessment: III - A patient with severe                            systemic disease. After reviewing the risks and                            benefits, the patient was deemed in satisfactory  condition to undergo the procedure.                           After obtaining informed consent, the colonoscope                            was passed under direct vision. Throughout the                            procedure, the patient's blood pressure, pulse, and                            oxygen saturations were monitored continuously. The                            Olympus CF-HQ190L [phone])  Colonoscope was                            introduced through the anus and advanced to the                            cecum, identified by appendiceal orifice and                            ileocecal valve. The colonoscopy was performed                            without difficulty. The patient tolerated the                            procedure well. The quality of the bowel                            preparation was good. The ileocecal valve,                            appendiceal orifice, and rectum were photographed. Scope In: 2:14:00 PM Scope Out: 2:29:02 PM Scope Withdrawal Time: 0 hours 11 minutes 4 seconds  Total Procedure Duration: 0 hours 15 minutes 2 seconds  Findings:                 The digital rectal exam was normal.                           Four sessile polyps were found in the ascending                            colon. The polyps were 3 to 5 mm in size. These                            polyps were removed with a cold snare. Resection                            and retrieval were complete.  A 3 mm polyp was found in the transverse colon. The                            polyp was sessile. The polyp was removed with a                            cold snare. Resection and retrieval were complete.                           A 6 mm polyp was found in the distal rectum. The                            polyp was sessile. The polyp was removed with a                            cold snare. Resection and retrieval were complete.                           Multiple large-mouthed, medium-mouthed and                            small-mouthed diverticula were found in the sigmoid                            colon and descending colon.                           No additional abnormalities were found on                            retroflexion. Complications:            No immediate complications. Estimated Blood Loss:     Estimated blood loss was minimal. Impression:                - Four 3 to 5 mm polyps in the ascending colon,                            removed with a cold snare. Resected and retrieved.                           - One 3 mm polyp in the transverse colon, removed                            with a cold snare. Resected and retrieved.                           - One 6 mm polyp in the distal rectum, removed with                            a cold snare. Resected and retrieved.                           - Moderate  diverticulosis in the sigmoid colon and                            in the descending colon. Recommendation:           - Patient has a contact number available for                            emergencies. The signs and symptoms of potential                            delayed complications were discussed with the                            patient. Return to normal activities tomorrow.                            Written discharge instructions were provided to the                            patient.                           - Resume previous diet.                           - Continue present medications.                           - Await pathology results.                           - Repeat colonoscopy is recommended for                            surveillance. The colonoscopy date will be                            determined after pathology results from today's                            exam become available for review. Jerene Bears, MD 05/12/2022 2:32:41 PM This report has been signed electronically.

## 2022-05-12 NOTE — Progress Notes (Signed)
Sedate, gd SR's, VSS, report to RN 

## 2022-05-12 NOTE — Progress Notes (Signed)
GASTROENTEROLOGY PROCEDURE H&P NOTE   Primary Care Physician: Ria Bush, MD    Reason for Procedure:  History of multiple adenomatous polyps  Plan:    Colonoscopy  Patient is appropriate for endoscopic procedure(s) in the ambulatory ([facility]) setting.  The nature of the procedure, as well as the risks, benefits, and alternatives were carefully and thoroughly reviewed with the patient. Ample time for discussion and questions allowed. The patient understood, was satisfied, and agreed to proceed.     HPI: Ryan Mathews is a 77 y.o. male who presents for surveillance colonoscopy.  Medical history as below.  Tolerated the prep.  No recent chest pain or shortness of breath.  No abdominal pain today.  Last exam 07/31/2017 6 adenomas left-sided diverticulosis  Past Medical History:  Diagnosis Date   Bilateral inguinal hernia (BIH) s/p lap repair 09/06/2012 08/09/2012   Cataract    CMC arthritis, thumb, degenerative    right   COPD (chronic obstructive pulmonary disease) (HCC)    Emphysema of lung (HCC)    Ex-smoker    GERD (gastroesophageal reflux disease)    History of chicken pox    History of pneumonia 08/06/2009        Hyperlipidemia    Hypertension    Nasal septal deviation 04/14/2017   Marked   Numbness    R cheek stays numb   Obesity (BMI 30-39.9) 08/09/2012    Past Surgical History:  Procedure Laterality Date   ANKLE SURGERY Left 1966   with tendon repair with stainless steel wire   CARDIAC CATHETERIZATION     COLONOSCOPY  07/2017   multiple tubular adenomas, rpt 3 yrs (Ezri Fanguy)   INSERTION OF MESH Bilateral 09/06/2012   Procedure: INSERTION OF MESH;  Surgeon: Adin Hector, MD   LAPAROSCOPIC INGUINAL HERNIA WITH UMBILICAL HERNIA Bilateral 09/06/2012   Procedure: LAPAROSCOPIC exploration and repair of hernias in bellybutton and bilateral groins with mesh;  Surgeon: Adin Hector, MD   MICRODISCECTOMY LUMBAR Right 08/2020   L4/5 (Dr Zada Finders)    [place]  04/29/2021   at [place] Dr Cheree Ditto   RIGHT/LEFT [place] N/A 02/16/2021   Procedure: RIGHT/LEFT HEART CATH AND CORONARY ANGIOGRAPHY;  Surgeon: Adrian Prows, MD;  Location: [facility];  Service: Cardiovascular;  Laterality: N/A;   TEE WITHOUT CARDIOVERSION N/A 02/12/2021   Procedure: TRANSESOPHAGEAL ECHOCARDIOGRAM (TEE);  Surgeon: Adrian Prows, MD;  Location: [facility];  Service: Cardiovascular;  Laterality: N/A;    Prior to Admission medications   Medication Sig Start Date End Date Taking? Authorizing Provider  Ascorbic Acid 500 MG CHEW Chew 1 tablet by mouth daily.   Yes [provider]  atorvastatin (LIPITOR) 20 MG tablet TAKE 1 TABLET BY MOUTH EVERY DAY 02/02/22  Yes Adrian Prows, MD  b complex vitamins tablet Take 1 tablet by mouth daily.   Yes [provider]  Omega-3 Fatty Acids (FISH OIL) 1000 MG CAPS Take 1 capsule (1,000 mg total) by mouth daily. 05/12/20  Yes Ria Bush, MD  Omeprazole-Sodium Bicarbonate (ZEGERID OTC) 20-1100 MG CAPS capsule Take 1 capsule by mouth daily before breakfast. 05/04/18  Yes Ria Bush, MD    Current Outpatient Medications  Medication Sig Dispense Refill   Ascorbic Acid 500 MG CHEW Chew 1 tablet by mouth daily.     atorvastatin (LIPITOR) 20 MG tablet TAKE 1 TABLET BY MOUTH EVERY DAY 90 tablet 1   b complex vitamins tablet Take 1 tablet by mouth daily.  Omega-3 Fatty Acids (FISH OIL) 1000 MG CAPS Take 1 capsule (1,000 mg total) by mouth daily.     Omeprazole-Sodium Bicarbonate (ZEGERID OTC) 20-1100 MG CAPS capsule Take 1 capsule by mouth daily before breakfast. 28 each 0   Current Facility-Administered Medications  Medication Dose Route Frequency Provider Last Rate Last Admin   0.9 %  sodium chloride infusion  500 mL Intravenous Once Cristalle Rohm, Lajuan Lines, MD        Allergies as of 05/12/2022   (No Known Allergies)    Family History  Problem Relation Age of Onset   Cancer  Mother        breast   Congenital heart disease Mother    Diabetes Mother    COPD Father    Cancer Sister        lung (minimal smoker)   Colon cancer Neg Hx    Esophageal cancer Neg Hx    Liver cancer Neg Hx    Rectal cancer Neg Hx    Stomach cancer Neg Hx    Pancreatic cancer Neg Hx     Social History   Socioeconomic History   Marital status: Married    Spouse name: Not on file   Number of children: 4   Years of education: 12   Highest education level: Not on file  Occupational History   Not on file  Tobacco Use   Smoking status: Former    Packs/day: 2.00    Years: 40.00    Total pack years: 80.00    Types: Cigarettes    Start date: 03/14/1968    Quit date: 03/14/2006    Years since quitting: 16.1   Smokeless tobacco: Never  Vaping Use   Vaping Use: Never used  Substance and Sexual Activity   Alcohol use: Yes    Comment: Occasionally   Drug use: No   Sexual activity: Yes  Other Topics Concern   Not on file  Social History Narrative   Caffeine: 2 coffee, 2 tea/day   Lives with wife and 4 cats and 1 dog, grown children   Occupation: Solicitor   Edu: HS   Activity: stays active at work and in garden   Diet: some water, fruits/vegetables daily   Social Determinants of Health   Financial Resource Strain: [place]  (07/07/2021)   Overall Financial Resource Strain (CARDIA)    Difficulty of Paying Living Expenses: Not hard at all  Food Insecurity: No Food Insecurity (07/07/2021)   Hunger Vital Sign    Worried About Running Out of Food in the Last Year: Never true    Ran Out of Food in the Last Year: Never true  Transportation Needs: No Transportation Needs (07/07/2021)   PRAPARE - Hydrologist (Medical): No    Lack of Transportation (Non-Medical): No  Physical Activity: Sufficiently Active (07/07/2021)   Exercise Vital Sign    Days of Exercise per Week: 5 days    Minutes of Exercise per Session: 30 min  Stress: No Stress  Concern Present (07/07/2021)   [place]    Feeling of Stress : Not at all  Social Connections: Unknown (07/07/2021)   Social Connection and Isolation Panel [NHANES]    Frequency of Communication with Friends and Family: More than three times a week    Frequency of Social Gatherings with Friends and Family: More than three times a week    Attends Religious Services: Not on file  Active Member of Clubs or Organizations: No    Attends Archivist Meetings: Never    Marital Status: Married  Human resources officer Violence: Not At Risk (07/07/2021)   Humiliation, Afraid, Rape, and Kick questionnaire    Fear of Current or Ex-Partner: No    Emotionally Abused: No    Physically Abused: No    Sexually Abused: No    Physical Exam: Vital signs in last 24 hours: '@BP'$  (!) 174/86   Pulse 60   Temp (!) 96.8 F (36 C)   Ht '5\' 9"'$  (1.753 m)   Wt 205 lb (93 kg)   SpO2 97%   BMI 30.27 kg/m  GEN: NAD EYE: Sclerae anicteric ENT: MMM CV: Non-tachycardic Pulm: CTA b/l GI: Soft, NT/ND NEURO:  Alert & Oriented x 3   Zenovia Jarred, MD [facility]  05/12/2022 1:53 PM

## 2022-05-12 NOTE — Patient Instructions (Addendum)
Recommendation- Patient has a contact number available for                            emergencies. The signs and symptoms of potential                            delayed complications were discussed with the                            patient. Return to normal activities tomorrow.                            Written discharge instructions were provided to the                            patient.                           - Resume previous diet.                           - Continue present medications.                           - Await pathology results.                           - Repeat colonoscopy is recommended for                            surveillance. The colonoscopy date will be                            determined after pathology results from today's                            exam become available for review.  Handouts on polyps and diverticulosis given.  YOU HAD AN ENDOSCOPIC PROCEDURE TODAY AT [facility]:   Refer to the procedure report that was given to you for any specific questions about what was found during the examination.  If the procedure report does not answer your questions, please call your gastroenterologist to clarify.  If you requested that your care partner not be given the details of your procedure findings, then the procedure report has been included in a sealed envelope for you to review at your convenience later.  YOU SHOULD EXPECT: Some feelings of bloating in the abdomen. Passage of more gas than usual.  Walking can help get rid of the air that was put into your GI tract during the procedure and reduce the bloating. If you had a lower endoscopy (such as a colonoscopy or flexible sigmoidoscopy) you may notice spotting of blood in your stool or on the toilet paper. If you underwent a bowel prep for your procedure, you may not have a normal bowel movement for a few days.  Please Note:  You might notice some irritation and congestion in your nose or  some drainage.  This is from the oxygen used during your procedure.  There is no need for concern and it should clear up in a day or so.  SYMPTOMS TO REPORT IMMEDIATELY:  Following lower endoscopy (colonoscopy or flexible sigmoidoscopy):  Excessive amounts of blood in the stool  Significant tenderness or worsening of abdominal pains  Swelling of the abdomen that is new, acute  Fever of 100F or higher  For urgent or emergent issues, a gastroenterologist can be reached at any hour by calling [phone]. Do not use MyChart messaging for urgent concerns.   DIET:  We do recommend a small meal at first, but then you may proceed to your regular diet.  Drink plenty of fluids but you should avoid alcoholic beverages for 24 hours.  ACTIVITY:  You should plan to take it easy for the rest of today and you should NOT DRIVE or use heavy machinery until tomorrow (because of the sedation medicines used during the test).    FOLLOW UP: Our staff will call the number listed on your records the next business day following your procedure.  We will call around 7:15- 8:00 am to check on you and address any questions or concerns that you may have regarding the information given to you following your procedure. If we do not reach you, we will leave a message.     If any biopsies were taken you will be contacted by phone or by letter within the next 1-3 weeks.  Please call us at [phone] if you have not heard about the biopsies in 3 weeks.   SIGNATURES/CONFIDENTIALITY: You and/or your care partner have signed paperwork which will be entered into your electronic medical record.  These signatures attest to the fact that that the information above on your After Visit Summary has been reviewed and is understood.  Full responsibility of the confidentiality of this discharge information lies with you and/or your care-partner.

## 2022-05-12 NOTE — Progress Notes (Signed)
Called to room to assist during endoscopic procedure.  Patient ID and intended procedure confirmed with present staff. Received instructions for my participation in the procedure from the performing physician.  

## 2022-05-13 ENCOUNTER — Telehealth: Payer: Self-pay

## 2022-05-13 NOTE — Telephone Encounter (Signed)
  Follow up Call-     05/12/2022    1:43 PM  Call back number  Post procedure Call Back phone  # [phone]  Permission to leave phone message Yes     Patient questions:  Do you have a fever, pain , or abdominal swelling? No. Pain Score  0 *  Have you tolerated food without any problems? Yes.    Have you been able to return to your normal activities? Yes.    Do you have any questions about your discharge instructions: Diet   No. Medications  No. Follow up visit  No.  Do you have questions or concerns about your Care? No.  Actions: * If pain score is 4 or above: No action needed, pain <4.

## 2022-05-19 ENCOUNTER — Encounter: Payer: Self-pay | Admitting: Internal Medicine

## 2022-05-26 ENCOUNTER — Ambulatory Visit: Payer: PPO | Admitting: Internal Medicine

## 2022-05-26 ENCOUNTER — Encounter: Payer: Self-pay | Admitting: Internal Medicine

## 2022-05-26 VITALS — BP 142/86 | HR 68 | Ht 69.0 in | Wt 214.0 lb

## 2022-05-26 DIAGNOSIS — E782 Mixed hyperlipidemia: Secondary | ICD-10-CM

## 2022-05-26 DIAGNOSIS — Z9889 Other specified postprocedural states: Secondary | ICD-10-CM

## 2022-05-26 DIAGNOSIS — I10 Essential (primary) hypertension: Secondary | ICD-10-CM

## 2022-05-26 MED ORDER — LISINOPRIL 10 MG PO TABS: 10.0000 mg | ORAL_TABLET | Freq: Every day | ORAL | 3 refills | Status: DC

## 2022-05-26 NOTE — Progress Notes (Signed)
Primary Physician/Referring:  Ria Bush, MD  Patient ID: Ryan Mathews, male    DOB: July 05, 1945, 77 y.o.   MRN: [id number]  Chief Complaint  Patient presents with   Hypertension   Follow-up    HPI:    Ryan Mathews  is a 77 y.o. Caucasian male with history of smoking and COPD, hypertension.  Patient presented to PCPs office 01/25/2021 with exertional dyspnea, PCP subsequently ordered echocardiogram which revealed hyperdynamic LV with LVEF of [id number], grade 2 diastolic dysfunction and severe mitral valve regurgitation with flail middle scallop of the posterior mitral leaflet concerning for chordal rupture. Patient was therefore referred to our office for urgent evaluation and management.    Patient subsequently underwent TEE and left/right heart catheterization, details below.  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 Einar Gip sent referral to [facility] cardiothoracic surgery for consideration of mitral valve repair/replacement.  Patient underwent mitral valve repair at [facility] by Dr. Cheree Ditto on 04/29/2021.   Patient presents today for 6 month follow up. Patient is hypertensive and states he was taken off his blood pressure medications by his PCP after his heart surgery. He states he is feeling well and is trying to get back into exercising, but the weather has inhibited his plans. He states when he was active, his blood pressure was normal 120-130 SBP noted on his home readings. Pt denies chest pain, shortness of breath, palpitations, orthopnea, PND, TIA/syncope.    Past Medical History:  Diagnosis Date   Bilateral inguinal hernia (BIH) s/p lap repair 09/06/2012 08/09/2012   Cataract    CMC arthritis, thumb, degenerative    right   COPD (chronic obstructive pulmonary disease) (HCC)    Emphysema of lung (HCC)    Ex-smoker    GERD (gastroesophageal reflux disease)    History of chicken pox    History of pneumonia 08/06/2009         Hyperlipidemia    Hypertension    Nasal septal deviation 04/14/2017   Marked   Numbness    R cheek stays numb   Obesity (BMI 30-39.9) 08/09/2012   Past Surgical History:  Procedure Laterality Date   ANKLE SURGERY Left 1966   with tendon repair with stainless steel wire   CARDIAC CATHETERIZATION     COLONOSCOPY  07/2017   multiple tubular adenomas, rpt 3 yrs ([facility])   INSERTION OF MESH Bilateral 09/06/2012   Procedure: INSERTION OF MESH;  Surgeon: Adin Hector, MD   LAPAROSCOPIC INGUINAL HERNIA WITH UMBILICAL HERNIA Bilateral 09/06/2012   Procedure: LAPAROSCOPIC exploration and repair of hernias in bellybutton and bilateral groins with mesh;  Surgeon: Adin Hector, MD   MICRODISCECTOMY LUMBAR Right 08/2020   L4/5 (Dr Zada Finders)   [place]  04/29/2021   at [place] Dr Cheree Ditto   RIGHT/LEFT [place] N/A 02/16/2021   Procedure: RIGHT/LEFT HEART CATH AND CORONARY ANGIOGRAPHY;  Surgeon: Adrian Prows, MD;  Location: [facility];  Service: Cardiovascular;  Laterality: N/A;   TEE WITHOUT CARDIOVERSION N/A 02/12/2021   Procedure: TRANSESOPHAGEAL ECHOCARDIOGRAM (TEE);  Surgeon: Adrian Prows, MD;  Location: [facility] ENDOSCOPY;  Service: Cardiovascular;  Laterality: N/A;   Family History  Problem Relation Age of Onset   Cancer Mother        breast   Congenital heart disease Mother    Diabetes Mother    COPD Father    Cancer Sister        lung (minimal  smoker)   Colon cancer Neg Hx    Esophageal cancer Neg Hx    Liver cancer Neg Hx    Rectal cancer Neg Hx    Stomach cancer Neg Hx    Pancreatic cancer Neg Hx     Social History   Tobacco Use   Smoking status: Former    Packs/day: 2.00    Years: 40.00    Additional pack years: 0.00    Total pack years: 80.00    Types: Cigarettes    Start date: 03/14/1968    Quit date: 03/14/2006    Years since quitting: 16.2   Smokeless tobacco: Never  Substance Use Topics   Alcohol use: Yes    Comment:  Occasionally   Marital Status: Married   ROS  Review of Systems  Constitutional: Negative.   Respiratory: Negative.    Cardiovascular:  Positive for leg swelling. Negative for chest pain, palpitations, orthopnea and claudication.  Gastrointestinal: Negative.   Musculoskeletal: Negative.   Neurological: Negative.   Psychiatric/Behavioral: Negative.      Objective  Blood pressure (!) 142/86, pulse 68, height '5\' 9"'$  (1.753 m), weight 214 lb (97.1 kg), SpO2 98 %.     05/26/2022    8:34 AM 05/26/2022    8:23 AM 05/12/2022    2:55 PM  Vitals with BMI  Height  '5\' 9"'$    Weight  214 lbs   BMI  0000000   Systolic [id number]  Diastolic 86 92 47  Pulse 68 64 56    Physical Exam Constitutional:      Appearance: Normal appearance.  Cardiovascular:     Rate and Rhythm: Normal rate and regular rhythm.     Pulses: Normal pulses.     Heart sounds: Normal heart sounds.  Pulmonary:     Effort: Pulmonary effort is normal.     Breath sounds: Normal breath sounds.  Abdominal:     General: Bowel sounds are normal.     Palpations: Abdomen is soft.  Musculoskeletal:     Right lower leg: Edema present.     Left lower leg: Edema present.     Comments: Trace BLE Edema    Skin:    General: Skin is warm and dry.  Neurological:     Mental Status: He is alert.      Laboratory examination:   Recent Labs    07/07/21 0740  NA 141  K 4.5  CL 107  CO2 27  GLUCOSE 97  BUN 19  CREATININE 1.03  CALCIUM 9.0   CrCl cannot be calculated (Patient's most recent lab result is older than the maximum 21 days allowed.).     Latest Ref Rng & Units 07/07/2021    7:40 AM 03/15/2021    9:58 PM 02/16/2021   11:47 AM  CMP  Glucose 70 - 99 mg/dL 97  166    BUN 6 - 23 mg/dL 19  27    Creatinine 0.40 - 1.50 mg/dL 1.03  1.23    Sodium 135 - 145 mEq/L 141  134  138   Potassium 3.5 - 5.1 mEq/L 4.5  4.0  4.6   Chloride 96 - 112 mEq/L 107  103    CO2 19 - 32 mEq/L 27  22    Calcium 8.4 - 10.5 mg/dL 9.0   9.4    Total Protein 6.0 - 8.3 g/dL 6.8     Total Bilirubin 0.2 - 1.2 mg/dL 0.3     Alkaline Phos 39 -  117 U/L 87     AST 0 - 37 U/L 20     ALT 0 - 53 U/L 15         Latest Ref Rng & Units 03/15/2021    9:58 PM 02/16/2021   11:47 AM 02/16/2021   11:42 AM  CBC  WBC 4.0 - 10.5 K/uL 15.2     Hemoglobin 13.0 - 17.0 g/dL 16.3  16.3  16.7   Hematocrit 39.0 - 52.0 % 46.7  48.0  49.0   Platelets 150 - 400 K/uL 207       Lipid Panel Recent Labs    07/07/21 0740  CHOL 94  TRIG 114.0  LDLCALC 37  VLDL 22.8  HDL 35.10*  CHOLHDL 3     HEMOGLOBIN A1C No results found for: "HGBA1C", "MPG" TSH No results for input(s): "TSH" in the last 8760 hours.   External labs:  None   Allergies  No Known Allergies   Medications Prior to Visit:   Outpatient Medications Prior to Visit  Medication Sig Dispense Refill   Ascorbic Acid 500 MG CHEW Chew 1 tablet by mouth daily.     aspirin EC 81 MG tablet Take 81 mg by mouth daily. Swallow whole.     atorvastatin (LIPITOR) 20 MG tablet TAKE 1 TABLET BY MOUTH EVERY DAY 90 tablet 1   b complex vitamins tablet Take 1 tablet by mouth daily.     Omega-3 Fatty Acids (FISH OIL) 1000 MG CAPS Take 1 capsule (1,000 mg total) by mouth daily.     Omeprazole-Sodium Bicarbonate (ZEGERID OTC) 20-1100 MG CAPS capsule Take 1 capsule by mouth daily before breakfast. (Patient taking differently: Take 1 capsule by mouth as needed.) 28 each 0   No facility-administered medications prior to visit.   Final Medications at End of Visit    Current Meds  Medication Sig   Ascorbic Acid 500 MG CHEW Chew 1 tablet by mouth daily.   aspirin EC 81 MG tablet Take 81 mg by mouth daily. Swallow whole.   atorvastatin (LIPITOR) 20 MG tablet TAKE 1 TABLET BY MOUTH EVERY DAY   b complex vitamins tablet Take 1 tablet by mouth daily.   lisinopril (ZESTRIL) 10 MG tablet Take 1 tablet (10 mg total) by mouth at bedtime.   Omega-3 Fatty Acids (FISH OIL) 1000 MG CAPS Take 1 capsule  (1,000 mg total) by mouth daily.   Omeprazole-Sodium Bicarbonate (ZEGERID OTC) 20-1100 MG CAPS capsule Take 1 capsule by mouth daily before breakfast. (Patient taking differently: Take 1 capsule by mouth as needed.)   Radiology:   No results found.  Chest x-ray 01/25/2021: 1. Dramatic change in the appearance of the lungs, which could be indicative of interstitial lung disease, although lung volumes are normal. Accordingly, acute atypical infection is not excluded. Further clinical evaluation is recommended. Consideration for follow-up high-resolution chest CT is suggested if clinically appropriate. 2. Aortic atherosclerosis  Cardiac Studies:   Echocardiogram 11/01/2021:  Normal LV systolic function with visual EF 55-60%. Left ventricle cavity  is normal in size. Mild concentric hypertrophy of the left ventricle.  Normal global wall motion. Doppler evidence of grade II (pseudonormal)  diastolic dysfunction, elevated LAP. Calculated EF 59%.  Left atrial cavity is mildly dilated.  Right atrial cavity is mildly dilated.  Right ventricle cavity is mildly dilated. Normal right ventricular  function.  Structurally normal trileaflet aortic valve.  Trace aortic regurgitation.  Moderate to severe mitral regurgitation. Mitral valve ring repair of the  mitral  annulus. E-wave dominant mitral inflow.  Structurally normal tricuspid valve.  Moderate tricuspid regurgitation.  Moderate to severe pulmonary hypertension. RVSP measures 52 mmHg.    Echocardiogram 05/03/2021:   NORMAL LEFT VENTRICULAR SYSTOLIC FUNCTION WITH MILD LVH    NORMAL RIGHT VENTRICULAR SYSTOLIC FUNCTION    VALVULAR REGURGITATION: TRIVIAL MR, MILD TR    PROSTHETIC VALVE(S): PROSTHETIC MV RING    POOR SOUND TRANSMISSION; DEFINITY USED TO HELP ENHANCE ENDOCARDIAL    BORDERS.    S/P MV REPAIR (04/29/2021). No significant gradient across the valve and no    significant regurgitation   Mitral valve repair 04/29/2021: By Dr. Cheree Ditto at  [facility] MV repair (36 mm Simulus semi rigid band) via [facility] ADULT, VALVULOPLASTY, MITRAL VALVE, VIA HEARTPORT, WITH CARDIOPULMONARY BYPASS; RADICAL RECONSTRUCTION, WITH OR WITHOUT RING. REPLACEMENT MITRAL VALVE VIA HEART PORT N/A 04/29/2021   Right and left heart catheterization 02/16/2021: RA 5/5, mean 2 mmHg, RA saturation 70%. RV 32/5, EDP 8 mmHg. PA 34/5, mean 18 mmHg.  PA saturation 69%. PW 7/23, mean 12 mmHg.  Giant V waves evident suggestive of severe MR.  Aortic saturation 100%. LV 122/-1, EDP 8 mmHg.  Ao 109/58, mean 75 mmHg.  No pressure gradient across the aortic valve. LM: Large vessel, mid to distal segment is mildly ectatic. LAD: Large vessel, gives origin to small diagonals, mild disease evident. LCx: Moderate caliber vessel, minimal disease. RI: Moderate caliber vessel.  Minimal ostial disease. RCA: Large-caliber vessel, dominant.  Minimal disease present.  Impression and recommendation: Patient will need mitral valve repair.  50 mL contrast utilized.  No immediate complications.  TEE 02/12/2021:   1. Left ventricular ejection fraction, by estimation, is 60 to 65%. The left ventricle has normal function. The left ventricle has no regional wall motion abnormalities.   2. Right ventricular systolic function is normal. The right ventricular size is normal.   3. Left atrial size was moderately dilated. No left atrial/left atrial appendage thrombus was detected.   4. The mitral valve is myxomatous. Severe mitral valve regurgitation. No evidence of mitral stenosis. There is severe holosystolic prolapse of multiple scallops of the posterior leaflet of the mitral valve.   5. The aortic valve is normal in structure. Aortic valve regurgitation is not visualized. No aortic stenosis is present.   6. There is mild (Grade II) plaque involving the aortic arch and descending aorta.   7. There is reversal of flow in both upper and lower right pulmonary veins and left  upper pulmonary vein.   Carotid artery duplex  02/09/2021:  The bifurcation, internal, external and common carotid arteries reveal no  evidence of significant stenosis, bilaterally.  Antegrade right vertebral artery flow. Antegrade left vertebral artery  flow.  Echocardiogram 01/26/2021:  1. Left ventricular ejection fraction, by estimation, is 70 to 75%. The left ventricle has hyperdynamic function. The left ventricle has no regional wall motion abnormalities. Left ventricular diastolic parameters are consistent with Grade II diastolic dysfunction (pseudonormalization).   2. Right ventricular systolic function is normal. The right ventricular size is normal. There is severely elevated pulmonary artery systolic pressure.   3. Left atrial size was moderately dilated.   4. Suspect flail middle scallop of the posterior mitral leaflet due to chordal rupture. The mitral valve is myxomatous. Severe mitral valve regurgitation. No evidence of mitral stenosis.   5. Tricuspid valve regurgitation is mild to moderate.   6. The aortic valve is tricuspid. Aortic valve regurgitation is not visualized. No aortic stenosis is  present.   EKG:  05/31/2021: Sinus rhythm rate of 94 bpm.  Left axis, left anterior fascicular block.  Incomplete right bundle branch block.  Nonspecific T wave abnormality.  No evidence of ischemia or underlying injury pattern.  Compared EKG 02/01/2021, no significant change.  Assessment     ICD-10-CM   1. S/P mitral valve repair  Z98.890 EKG 12-Lead    PCV ECHOCARDIOGRAM COMPLETE    Basic metabolic panel    2. Primary hypertension  I10     3. Mixed hyperlipidemia  E78.2        There are no discontinued medications.    Meds ordered this encounter  Medications   lisinopril (ZESTRIL) 10 MG tablet    Sig: Take 1 tablet (10 mg total) by mouth at bedtime.    Dispense:  90 tablet    Refill:  3     Recommendations:   Ryan Mathews is a 77 y.o. hypertension, obesity  referred to me on an urgent basis for evaluation of mitral regurgitation and heart failure.  Patient presents today for 6 month follow up. Patient is hypertensive and states he was taken off his blood pressure medications by his PCP after his heart surgery. He states he is feeling well and is trying to get back into exercising, but the weather has inhibited his plans. He states when he was active, his blood pressure was normal 120-130 SBP noted on his home readings. Pt denies chest pain, shortness of breath, palpitations, orthopnea, PND, TIA/syncope.   1. S/P mitral valve repair Plan - Continue current cardiac medications - Start lisinopril 10 mg Po Daily at night - discussed to use this medication to treat his high blood pressure and once he is back exercising and notices his blood pressure to be back in his normal range, then the lisinopril can be stopped. Encouraged to continue to monitor home blood pressures.  - BMP ordered  - Encourage low-sodium diet, less than 2000 mg daily. - Schedule imaging tests in office, may can change to annual ECHO - Will need to come back in 6 months for blood pressure and weight loss evaluation   Follow-up in 6 months or sooner if needed.     Erma Heritage, NP-S  Patient seen with Erma Heritage, NP-S. I independently reviewed the chart and examined the patient. I agree with assessment & plan as documented by above.   [place]  DO, [facility] 05/26/2022, 9:04 AM Office: [phone]

## 2022-06-02 DIAGNOSIS — Z9889 Other specified postprocedural states: Secondary | ICD-10-CM | POA: Diagnosis not present

## 2022-06-03 LAB — BASIC METABOLIC PANEL
BUN/Creatinine Ratio: 22 (ref 10–24)
BUN: 26 mg/dL (ref 8–27)
CO2: 23 mmol/L (ref 20–29)
Calcium: 9.1 mg/dL (ref 8.6–10.2)
Chloride: 103 mmol/L (ref 96–106)
Creatinine, Ser: 1.2 mg/dL (ref 0.76–1.27)
Glucose: 120 mg/dL — ABNORMAL HIGH (ref 70–99)
Potassium: 4.5 mmol/L (ref 3.5–5.2)
Sodium: 140 mmol/L (ref 134–144)
eGFR: 63 mL/min/{1.73_m2} (ref >59)

## 2022-06-09 ENCOUNTER — Ambulatory Visit: Payer: PPO

## 2022-06-09 DIAGNOSIS — Z9889 Other specified postprocedural states: Secondary | ICD-10-CM

## 2022-06-09 DIAGNOSIS — I10 Essential (primary) hypertension: Secondary | ICD-10-CM | POA: Diagnosis not present

## 2022-07-11 ENCOUNTER — Ambulatory Visit (INDEPENDENT_AMBULATORY_CARE_PROVIDER_SITE_OTHER): Payer: PPO

## 2022-07-11 VITALS — Ht 69.0 in | Wt 207.0 lb

## 2022-07-11 DIAGNOSIS — Z0000 Encounter for general adult medical examination without abnormal findings: Secondary | ICD-10-CM

## 2022-07-11 NOTE — Progress Notes (Signed)
I connected with  Ryan Mathews on 07/11/22 by a audio enabled telemedicine application and verified that I am speaking with the correct person using two identifiers.  Patient Location: Home  Provider Location: Home Office  I discussed the limitations of evaluation and management by telemedicine. The patient expressed understanding and agreed to proceed.  Subjective:   Ryan Mathews is a 77 y.o. male who presents for Medicare Annual/Subsequent preventive examination.  Review of Systems      Cardiac Risk Factors include: advanced age (>19men, >34 women);hypertension;male gender;sedentary lifestyle     Objective:    Today's Vitals   07/11/22 1034  Weight: 207 lb (93.9 kg)  Height: 5\' 9"  (1.753 m)   Body mass index is 30.57 kg/m.     07/11/2022   10:45 AM 07/07/2021   10:35 AM 03/15/2021    9:54 PM 02/16/2021    8:57 AM 02/12/2021    6:37 AM 05/08/2020    1:59 PM 05/04/2018   10:50 AM  Advanced Directives  Does Patient Have a Medical Advance Directive? Yes Yes No No No No No  Type of Estate agent of [place];Living will Healthcare Power of [place];Living will       Does patient want to make changes to medical advance directive?      No - Patient declined   Copy of Healthcare Power of Attorney in Chart? No - copy requested No - copy requested       Would patient like information on creating a medical advance directive?    No - Patient declined No - Patient declined  No - Patient declined    Current Medications (verified) Outpatient Encounter Medications as of 07/11/2022  Medication Sig   Ascorbic Acid 500 MG CHEW Chew 1 tablet by mouth daily.   aspirin EC 81 MG tablet Take 81 mg by mouth daily. Swallow whole.   atorvastatin (LIPITOR) 20 MG tablet TAKE 1 TABLET BY MOUTH EVERY DAY   b complex vitamins tablet Take 1 tablet by mouth daily.   lisinopril (ZESTRIL) 10 MG tablet Take 1 tablet (10 mg total) by mouth at bedtime.   Omega-3 Fatty Acids  (FISH OIL) 1000 MG CAPS Take 1 capsule (1,000 mg total) by mouth daily.   Omeprazole-Sodium Bicarbonate (ZEGERID OTC) 20-1100 MG CAPS capsule Take 1 capsule by mouth daily before breakfast. (Patient taking differently: Take 1 capsule by mouth as needed.)   No facility-administered encounter medications on file as of 07/11/2022.    Allergies (verified) Patient has no known allergies.   History: Past Medical History:  Diagnosis Date   Bilateral inguinal hernia (BIH) s/p lap repair 09/06/2012 08/09/2012   Cataract    CMC arthritis, thumb, degenerative    right   COPD (chronic obstructive pulmonary disease) (HCC)    Emphysema of lung (HCC)    Ex-smoker    GERD (gastroesophageal reflux disease)    History of chicken pox    History of pneumonia 08/06/2009        Hyperlipidemia    Hypertension    Nasal septal deviation 04/14/2017   Marked   Numbness    R cheek stays numb   Obesity (BMI 30-39.9) 08/09/2012   Past Surgical History:  Procedure Laterality Date   ANKLE SURGERY Left 1966   with tendon repair with stainless steel wire   CARDIAC CATHETERIZATION     COLONOSCOPY  07/2017   multiple tubular adenomas, rpt 3 yrs ([facility])   INSERTION OF MESH Bilateral 09/06/2012  Procedure: INSERTION OF MESH;  Surgeon: Ardeth Sportsman, MD   LAPAROSCOPIC INGUINAL HERNIA WITH UMBILICAL HERNIA Bilateral 09/06/2012   Procedure: LAPAROSCOPIC exploration and repair of hernias in bellybutton and bilateral groins with mesh;  Surgeon: Ardeth Sportsman, MD   MICRODISCECTOMY LUMBAR Right 08/2020   L4/5 (Dr Maurice Small)   MITRAL VALVE REPAIR  04/29/2021   at [facility] Dr Zebedee Iba   RIGHT/LEFT HEART CATH AND CORONARY ANGIOGRAPHY N/A 02/16/2021   Procedure: RIGHT/LEFT HEART CATH AND CORONARY ANGIOGRAPHY;  Surgeon: Yates Decamp, MD;  Location: [facility] INVASIVE CV LAB;  Service: Cardiovascular;  Laterality: N/A;   TEE WITHOUT CARDIOVERSION N/A 02/12/2021   Procedure: TRANSESOPHAGEAL ECHOCARDIOGRAM (TEE);  Surgeon: Yates Decamp, MD;  Location: [facility] ENDOSCOPY;  Service: Cardiovascular;  Laterality: N/A;   Family History  Problem Relation Age of Onset   Cancer Mother        breast   Congenital heart disease Mother    Diabetes Mother    COPD Father    Cancer Sister        lung (minimal smoker)   Colon cancer Neg Hx    Esophageal cancer Neg Hx    Liver cancer Neg Hx    Rectal cancer Neg Hx    Stomach cancer Neg Hx    Pancreatic cancer Neg Hx    Social History   Socioeconomic History   Marital status: Married    Spouse name: Not on file   Number of children: 4   Years of education: 12   Highest education level: Not on file  Occupational History   Not on file  Tobacco Use   Smoking status: Former    Packs/day: 2.00    Years: 40.00    Additional pack years: 0.00    Total pack years: 80.00    Types: Cigarettes    Start date: 03/14/1968    Quit date: 03/14/2006    Years since quitting: 16.3   Smokeless tobacco: Never  Vaping Use   Vaping Use: Never used  Substance and Sexual Activity   Alcohol use: Yes    Comment: Occasionally   Drug use: No   Sexual activity: Yes  Other Topics Concern   Not on file  Social History Narrative   Caffeine: 2 coffee, 2 tea/day   Lives with wife and 4 cats and 1 dog, grown children   Occupation: Radio broadcast assistant   Edu: HS   Activity: stays active at work and in garden   Diet: some water, fruits/vegetables daily   Social Determinants of Health   Financial Resource Strain: Low Risk  (07/11/2022)   Overall Financial Resource Strain (CARDIA)    Difficulty of Paying Living Expenses: Not hard at all  Food Insecurity: No Food Insecurity (07/11/2022)   Hunger Vital Sign    Worried About Running Out of Food in the Last Year: Never true    Ran Out of Food in the Last Year: Never true  Transportation Needs: No Transportation Needs (07/07/2021)   PRAPARE - Administrator, Civil Service (Medical): No    Lack of Transportation (Non-Medical): No  Physical  Activity: Inactive (07/11/2022)   Exercise Vital Sign    Days of Exercise per Week: 0 days    Minutes of Exercise per Session: 0 min  Stress: No Stress Concern Present (07/11/2022)   [employer] of Occupational Health - Occupational Stress Questionnaire    Feeling of Stress : Not at all  Social Connections: Moderately Isolated (07/11/2022)   Social Connection  and Isolation Panel [NHANES]    Frequency of Communication with Friends and Family: More than three times a week    Frequency of Social Gatherings with Friends and Family: More than three times a week    Attends Religious Services: Never    Database administrator or Organizations: No    Attends Engineer, structural: Never    Marital Status: Married    Tobacco Counseling Counseling given: Not Answered   Clinical Intake:  Pre-visit preparation completed: Yes  Pain : No/denies pain     Nutritional Risks: None Diabetes: No  How often do you need to have someone help you when you read instructions, pamphlets, or other written materials from your doctor or pharmacy?: 1 - Never  Diabetic? no  Interpreter Needed?: No  Information entered by :: C.Neko Mcgeehan LPN   Activities of Daily Living    07/11/2022   10:45 AM  In your present state of health, do you have any difficulty performing the following activities:  Hearing? 0  Vision? 0  Difficulty concentrating or making decisions? 0  Walking or climbing stairs? 0  Dressing or bathing? 0  Doing errands, shopping? 0  Preparing Food and eating ? N  Using the Toilet? N  In the past six months, have you accidently leaked urine? N  Do you have problems with loss of bowel control? N  Managing your Medications? N  Managing your Finances? N  Housekeeping or managing your Housekeeping? N    Patient Care Team: [name], MD as PCP - General (Family Medicine) [name], MD as Consulting Physician (Pulmonary Disease)  Indicate any recent Medical  Services you may have received from other than [facility] providers in the past year (date may be approximate).     Assessment:   This is a routine wellness examination for [facility].  Hearing/Vision screen Hearing Screening - Comments:: No aids Vision Screening - Comments:: Readers - [facility]  Dietary issues and exercise activities discussed: Current Exercise Habits: Home exercise routine, Exercise limited by: None identified   Goals Addressed             This Visit's Progress    Patient Stated       Keep weight down.       Depression Screen    07/11/2022   10:44 AM 10/13/2021    7:34 AM 07/15/2021    1:36 PM 07/07/2021   10:31 AM 05/08/2020    2:01 PM 05/10/2019    2:15 PM 05/04/2018   10:50 AM  PHQ 2/9 Scores  PHQ - 2 Score 0 0 0 0 0 0 0  PHQ- 9 Score     0  0  Exception Documentation  Patient refusal         Fall Risk    07/11/2022   10:40 AM 07/15/2021    3:03 PM 07/07/2021   10:37 AM 05/08/2020    2:00 PM 05/10/2019    2:14 PM  Fall Risk   Falls in the past year? 0 0 0 0 0  Number falls in past yr: 0 0 0 0   Injury with Fall? 0 0 0 0   Risk for fall due to : No Fall Risks No Fall Risks No Fall Risks No Fall Risks   Follow up Falls prevention discussed;Falls evaluation completed Falls evaluation completed Falls prevention discussed Falls evaluation completed;Falls prevention discussed     FALL RISK PREVENTION PERTAINING TO THE HOME:  Any stairs in or around  the home? Yes  If so, are there any without handrails? No  Home free of loose throw rugs in walkways, pet beds, electrical cords, etc? Yes  Adequate lighting in your home to reduce risk of falls? Yes   ASSISTIVE DEVICES UTILIZED TO PREVENT FALLS:  Life alert? No  Use of a cane, walker or w/c? No  Grab bars in the bathroom? No  Shower chair or bench in shower? Yes  Elevated toilet seat or a handicapped toilet? No    Cognitive Function:    05/08/2020    2:03 PM 05/04/2018   11:09 AM 10/15/2015     9:40 AM  MMSE - Mini Mental State Exam  Not completed: Refused    Orientation to time  5 5  Orientation to Place  5 5  Registration  3 3  Attention/ Calculation  0 0  Recall  1 3  Recall-comments  unable to recall 2 of 3 words   Language- name 2 objects  0 0  Language- repeat  1 1  Language- follow 3 step command  3 3  Language- read & follow direction  0 0  Write a sentence  0 0  Copy design  0 0  Total score  18 20        07/11/2022   10:46 AM 07/07/2021   10:38 AM  6CIT Screen  What Year? 0 points 0 points  What month? 0 points 0 points  What time? 0 points 0 points  Count back from 20 0 points 0 points  Months in reverse 0 points 0 points  Repeat phrase 0 points 0 points  Total Score 0 points 0 points    Immunizations Immunization History  Administered Date(s) Administered   Covid-19, Mrna,Vaccine(Spikevax)48yrs and older 12/21/2021   Influenza Split 11/13/2010   Influenza Whole 12/13/2011   Influenza, High Dose Seasonal PF 12/22/2016, 01/25/2018, 11/23/2018, 11/29/2019, 12/18/2020, 12/21/2021   Influenza,inj,Quad PF,6+ Mos 12/22/2015   Influenza-Unspecified 01/16/2021   PFIZER(Purple Top)SARS-COV-2 Vaccination 04/05/2019, 04/26/2019, 12/16/2019   Pfizer Covid-19 Vaccine Bivalent Booster 20yrs & up 01/08/2021   Pneumococcal Conjugate-13 10/15/2015   Pneumococcal Polysaccharide-23 05/03/2011, 04/21/2017   Zoster, Live 01/13/2012    TDAP status: Due, Education has been provided regarding the importance of this vaccine. Advised may receive this vaccine at local pharmacy or Health Dept. Aware to provide a copy of the vaccination record if obtained from local pharmacy or Health Dept. Verbalized acceptance and understanding.  Flu Vaccine status: Up to date  Pneumococcal vaccine status: Up to date  Covid-19 vaccine status: Information provided on how to obtain vaccines.   Qualifies for Shingles Vaccine? Yes   Zostavax completed No   Shingrix Completed?: No.     Education has been provided regarding the importance of this vaccine. Patient has been advised to call insurance company to determine out of pocket expense if they have not yet received this vaccine. Advised may also receive vaccine at local pharmacy or Health Dept. Verbalized acceptance and understanding.  Screening Tests Health Maintenance  Topic Date Due   Zoster Vaccines- Shingrix (1 of 2) Never done   COVID-19 Vaccine (6 - 2023-24 season) 02/15/2022   DTaP/Tdap/Td (2 - Td or Tdap) 03/14/2022   INFLUENZA VACCINE  10/13/2022   Medicare Annual Wellness (AWV)  07/11/2023   COLONOSCOPY (Pts 45-62yrs Insurance coverage will need to be confirmed)  05/11/2025   Pneumonia Vaccine 36+ Years old  Completed   Hepatitis C Screening  Completed   HPV VACCINES  Aged  Out    Health Maintenance  Health Maintenance Due  Topic Date Due   Zoster Vaccines- Shingrix (1 of 2) Never done   COVID-19 Vaccine (6 - 2023-24 season) 02/15/2022   DTaP/Tdap/Td (2 - Td or Tdap) 03/14/2022    Colorectal cancer screening: Type of screening: Colonoscopy. Completed 05/12/2022. Repeat every 3 years  Lung Cancer Screening: (Low Dose CT Chest recommended if Age 2-80 years, 30 pack-year currently smoking OR have quit w/in 15years.) does not qualify.   Lung Cancer Screening Referral: no  Additional Screening:  Hepatitis C Screening: does not qualify; Completed 10/15/15  Vision Screening: Recommended annual ophthalmology exams for early detection of glaucoma and other disorders of the eye. Is the patient up to date with their annual eye exam?  Yes  Who is the provider or what is the name of the office in which the patient attends annual eye exams? [facility] If pt is not established with a provider, would they like to be referred to a provider to establish care? No .   Dental Screening: Recommended annual dental exams for proper oral hygiene  Community Resource Referral / Chronic Care  Management: CRR required this visit?  No   CCM required this visit?  No      Plan:     I have personally reviewed and noted the following in the patient's chart:   Medical and social history Use of alcohol, tobacco or illicit drugs  Current medications and supplements including opioid prescriptions. Patient is not currently taking opioid prescriptions. Functional ability and status Nutritional status Physical activity Advanced directives List of other physicians Hospitalizations, surgeries, and ER visits in previous 12 months Vitals Screenings to include cognitive, depression, and falls Referrals and appointments  In addition, I have reviewed and discussed with patient certain preventive protocols, quality metrics, and best practice recommendations. A written personalized care plan for preventive services as well as general preventive health recommendations were provided to patient.     Maryan Puls, LPN   1/61/0960   Nurse Notes: none

## 2022-07-11 NOTE — Patient Instructions (Signed)
Ryan Mathews , Thank you for taking time to come for your Medicare Wellness Visit. I appreciate your ongoing commitment to your health goals. Please review the following plan we discussed and let me know if I can assist you in the future.   These are the goals we discussed:  Goals      Patient Stated     Starting 05/04/2018, I will continue to sleep at least 7 hours daily.      Patient Stated     05/08/2020, I will maintain and continue medications as prescribed.      Patient Stated     Keep weight down.        This is a list of the screening recommended for you and due dates:  Health Maintenance  Topic Date Due   Zoster (Shingles) Vaccine (1 of 2) Never done   COVID-19 Vaccine (6 - 2023-24 season) 02/15/2022   DTaP/Tdap/Td vaccine (2 - Td or Tdap) 03/14/2022   Flu Shot  10/13/2022   Medicare Annual Wellness Visit  07/11/2023   Colon Cancer Screening  05/11/2025   Pneumonia Vaccine  Completed   Hepatitis C Screening: USPSTF Recommendation to screen - Ages 18-79 yo.  Completed   HPV Vaccine  Aged Out    Advanced directives: Please bring a copy of your health care power of attorney and living will to the office to be added to your chart at your convenience.   Conditions/risks identified: .a  Next appointment: Follow up in one year for your annual wellness visit. 07/13/23 @ 8:15 televisit  Preventive Care 65 Years and Older, Male  Preventive care refers to lifestyle choices and visits with your health care provider that can promote health and wellness. What does preventive care include? A yearly physical exam. This is also called an annual well check. Dental exams once or twice a year. Routine eye exams. Ask your health care provider how often you should have your eyes checked. Personal lifestyle choices, including: Daily care of your teeth and gums. Regular physical activity. Eating a healthy diet. Avoiding tobacco and drug use. Limiting alcohol use. Practicing safe  sex. Taking low doses of aspirin every day. Taking vitamin and mineral supplements as recommended by your health care provider. What happens during an annual well check? The services and screenings done by your health care provider during your annual well check will depend on your age, overall health, lifestyle risk factors, and family history of disease. Counseling  Your health care provider may ask you questions about your: Alcohol use. Tobacco use. Drug use. Emotional well-being. Home and relationship well-being. Sexual activity. Eating habits. History of falls. Memory and ability to understand (cognition). Work and work Astronomer. Screening  You may have the following tests or measurements: Height, weight, and BMI. Blood pressure. Lipid and cholesterol levels. These may be checked every 5 years, or more frequently if you are over 55 years old. Skin check. Lung cancer screening. You may have this screening every year starting at age 13 if you have a 30-pack-year history of smoking and currently smoke or have quit within the past 15 years. Fecal occult blood test (FOBT) of the stool. You may have this test every year starting at age 67. Flexible sigmoidoscopy or colonoscopy. You may have a sigmoidoscopy every 5 years or a colonoscopy every 10 years starting at age 32. Prostate cancer screening. Recommendations will vary depending on your family history and other risks. Hepatitis C blood test. Hepatitis B blood test. Sexually transmitted  disease (STD) testing. Diabetes screening. This is done by checking your blood sugar (glucose) after you have not eaten for a while (fasting). You may have this done every 1-3 years. Abdominal aortic aneurysm (AAA) screening. You may need this if you are a current or former smoker. Osteoporosis. You may be screened starting at age 75 if you are at high risk. Talk with your health care provider about your test results, treatment options, and if  necessary, the need for more tests. Vaccines  Your health care provider may recommend certain vaccines, such as: Influenza vaccine. This is recommended every year. Tetanus, diphtheria, and acellular pertussis (Tdap, Td) vaccine. You may need a Td booster every 10 years. Zoster vaccine. You may need this after age 56. Pneumococcal 13-valent conjugate (PCV13) vaccine. One dose is recommended after age 14. Pneumococcal polysaccharide (PPSV23) vaccine. One dose is recommended after age 72. Talk to your health care provider about which screenings and vaccines you need and how often you need them. This information is not intended to replace advice given to you by your health care provider. Make sure you discuss any questions you have with your health care provider. Document Released: 03/27/2015 Document Revised: 11/18/2015 Document Reviewed: 12/30/2014 Elsevier Interactive Patient Education  2017 [place] Prevention in the Home Falls can cause injuries. They can happen to people of all ages. There are many things you can do to make your home safe and to help prevent falls. What can I do on the outside of my home? Regularly fix the edges of walkways and driveways and fix any cracks. Remove anything that might make you trip as you walk through a door, such as a raised step or threshold. Trim any bushes or trees on the path to your home. Use bright outdoor lighting. Clear any walking paths of anything that might make someone trip, such as rocks or tools. Regularly check to see if handrails are loose or broken. Make sure that both sides of any steps have handrails. Any raised decks and porches should have guardrails on the edges. Have any leaves, snow, or ice cleared regularly. Use sand or salt on walking paths during winter. Clean up any spills in your garage right away. This includes oil or grease spills. What can I do in the bathroom? Use night lights. Install grab bars by the toilet  and in the tub and shower. Do not use towel bars as grab bars. Use non-skid mats or decals in the tub or shower. If you need to sit down in the shower, use a plastic, non-slip stool. Keep the floor dry. Clean up any water that spills on the floor as soon as it happens. Remove soap buildup in the tub or shower regularly. Attach bath mats securely with double-sided non-slip rug tape. Do not have throw rugs and other things on the floor that can make you trip. What can I do in the bedroom? Use night lights. Make sure that you have a light by your bed that is easy to reach. Do not use any sheets or blankets that are too big for your bed. They should not hang down onto the floor. Have a firm chair that has side arms. You can use this for support while you get dressed. Do not have throw rugs and other things on the floor that can make you trip. What can I do in the kitchen? Clean up any spills right away. Avoid walking on wet floors. Keep items that you use a  lot in easy-to-reach places. If you need to reach something above you, use a strong step stool that has a grab bar. Keep electrical cords out of the way. Do not use floor polish or wax that makes floors slippery. If you must use wax, use non-skid floor wax. Do not have throw rugs and other things on the floor that can make you trip. What can I do with my stairs? Do not leave any items on the stairs. Make sure that there are handrails on both sides of the stairs and use them. Fix handrails that are broken or loose. Make sure that handrails are as long as the stairways. Check any carpeting to make sure that it is firmly attached to the stairs. Fix any carpet that is loose or worn. Avoid having throw rugs at the top or bottom of the stairs. If you do have throw rugs, attach them to the floor with carpet tape. Make sure that you have a light switch at the top of the stairs and the bottom of the stairs. If you do not have them, ask someone to add  them for you. What else can I do to help prevent falls? Wear shoes that: Do not have high heels. Have rubber bottoms. Are comfortable and fit you well. Are closed at the toe. Do not wear sandals. If you use a stepladder: Make sure that it is fully opened. Do not climb a closed stepladder. Make sure that both sides of the stepladder are locked into place. Ask someone to hold it for you, if possible. Clearly mark and make sure that you can see: Any grab bars or handrails. First and last steps. Where the edge of each step is. Use tools that help you move around (mobility aids) if they are needed. These include: Canes. Walkers. Scooters. Crutches. Turn on the lights when you go into a dark area. Replace any light bulbs as soon as they burn out. Set up your furniture so you have a clear path. Avoid moving your furniture around. If any of your floors are uneven, fix them. If there are any pets around you, be aware of where they are. Review your medicines with your doctor. Some medicines can make you feel dizzy. This can increase your chance of falling. Ask your doctor what other things that you can do to help prevent falls. This information is not intended to replace advice given to you by your health care provider. Make sure you discuss any questions you have with your health care provider. Document Released: 12/25/2008 Document Revised: 08/06/2015 Document Reviewed: 04/04/2014 Elsevier Interactive Patient Education  2017 [employer].

## 2022-07-27 DIAGNOSIS — Z961 Presence of intraocular lens: Secondary | ICD-10-CM | POA: Diagnosis not present

## 2022-07-27 DIAGNOSIS — H26491 Other secondary cataract, right eye: Secondary | ICD-10-CM | POA: Diagnosis not present

## 2022-07-27 DIAGNOSIS — H52203 Unspecified astigmatism, bilateral: Secondary | ICD-10-CM | POA: Diagnosis not present

## 2022-07-30 ENCOUNTER — Other Ambulatory Visit: Payer: Self-pay | Admitting: Cardiology

## 2022-08-09 ENCOUNTER — Ambulatory Visit (INDEPENDENT_AMBULATORY_CARE_PROVIDER_SITE_OTHER): Payer: PPO | Admitting: Family Medicine

## 2022-08-09 ENCOUNTER — Ambulatory Visit (INDEPENDENT_AMBULATORY_CARE_PROVIDER_SITE_OTHER)
Admission: RE | Admit: 2022-08-09 | Discharge: 2022-08-09 | Disposition: A | Discharge status: Home / Self Care | Payer: PPO | Source: Ambulatory Visit | Attending: Family Medicine | Admitting: Family Medicine

## 2022-08-09 ENCOUNTER — Telehealth: Payer: Self-pay | Admitting: Family Medicine

## 2022-08-09 ENCOUNTER — Encounter: Payer: Self-pay | Admitting: Family Medicine

## 2022-08-09 VITALS — BP 134/78 | HR 75 | Temp 97.4°F | Ht 69.0 in | Wt 210.0 lb

## 2022-08-09 DIAGNOSIS — R091 Pleurisy: Secondary | ICD-10-CM

## 2022-08-09 DIAGNOSIS — R071 Chest pain on breathing: Secondary | ICD-10-CM | POA: Diagnosis not present

## 2022-08-09 DIAGNOSIS — J189 Pneumonia, unspecified organism: Secondary | ICD-10-CM | POA: Diagnosis not present

## 2022-08-09 DIAGNOSIS — Z9889 Other specified postprocedural states: Secondary | ICD-10-CM

## 2022-08-09 DIAGNOSIS — R079 Chest pain, unspecified: Secondary | ICD-10-CM | POA: Diagnosis not present

## 2022-08-09 LAB — POC COVID19 BINAXNOW: SARS Coronavirus 2 Ag: NEGATIVE

## 2022-08-09 MED ORDER — AZITHROMYCIN 250 MG PO TABS: ORAL_TABLET | ORAL | 0 refills | Status: AC

## 2022-08-09 MED ORDER — AMOXICILLIN-POT CLAVULANATE 875-125 MG PO TABS: 1.0000 | ORAL_TABLET | Freq: Two times a day (BID) | ORAL | 0 refills | Status: AC

## 2022-08-09 NOTE — Telephone Encounter (Signed)
Pt called in requesting a appt with Dr. Reece Agar for possible pneumonia. Pt stated he is also having sob & chest pains. Scheduled pt for today, 5/28 @ 10:30am. Transferred pt to access nurse. Call back # [phone]

## 2022-08-09 NOTE — Assessment & Plan Note (Signed)
Latest echo 05/2022 showed moderate MR, overall improvement after MV ring repair.

## 2022-08-09 NOTE — Telephone Encounter (Signed)
Will see then. 

## 2022-08-09 NOTE — Assessment & Plan Note (Addendum)
Overall atypical presentation for pneumonia - no significant productive cough or fever. CXR today r/o pneumonia.  CXR concerning for multifocal pneumonia - check COVID swab today (negative), send off RSV as well. Rx augmentin/azithromycin course to cover CAP.  If ongoing, low threshold to return to cardiology in h/o MV repair.  No reproducible chest wall pain pointing against msk cause.

## 2022-08-09 NOTE — Patient Instructions (Addendum)
Return for physical at your convenience.  Chest x-ray today.  COVID test negative. Take antibiotics prescribed today.  Let us know if not improving with treatment.

## 2022-08-09 NOTE — Telephone Encounter (Signed)
Per appt notes pt already has appt with Dr Reece Agar 08/09/22 at 10:30. Sending note to DR G and G pool.

## 2022-08-09 NOTE — Progress Notes (Signed)
Ph: [phone] Fax: [phone]   Patient ID: Ryan Mathews, male    DOB: [birth year], 77 y.o.   MRN: [id number]  This visit was conducted in person.  BP 134/78   Pulse 75   Temp (!) 97.4 F (36.3 C) (Temporal)   Ht 5\' 9"  (1.753 m)   Wt 210 lb (95.3 kg)   SpO2 96%   BMI 31.01 kg/m    CC: "I think I've got pneumonia" dyspnea, chest pain with breathing Subjective:   HPI: Ryan Mathews is a 77 y.o. male presenting on 08/09/2022 for Shortness of Breath (C/o SOB and chest pain-burning sensation when breathing deeply. Sxs started 08/06/22. Concerned about PNA due to h/o PNA. )   3d h/o shortness of breath associated with sharp pleuritic left chest pain that radiates to the back. Started Saturday night into Sunday morning after he fell asleep on recliner. No pain at rest. Hears gurgling in chest when breathing. Mild cough last night, chills overnight. Mild rhinorrhea for several weeks.   No fevers, ST, congestion. No unexpected weight loss or night sweats.  No leg swelling.  No sick contacts at home.   H/o pneumonia 2011 S/p prevnar-13 10/2015, penumovax-23 04/2017.  Did not receive RSV vaccine.   H/o mitral valve repair at [facility] 04/2021. Completed cardiac rehab. [facility] cardiology, on lisinopril 10mg  daily.  H/o COPD bullous emphysema by prior imaging, not on respiratory medication.  No h/o asthma or allergies.  Ex smoker - quit 2008  Continues working desk job with [employer]     Relevant past medical, surgical, family and social history reviewed and updated as indicated. Interim medical history since our last visit reviewed. Allergies and medications reviewed and updated. Outpatient Medications Prior to Visit  Medication Sig Dispense Refill   Ascorbic Acid 500 MG CHEW Chew 1 tablet by mouth daily.     aspirin EC 81 MG tablet Take 81 mg by mouth daily. Swallow whole.     atorvastatin (LIPITOR) 20 MG tablet TAKE 1 TABLET BY MOUTH EVERY DAY 90 tablet 1   b  complex vitamins tablet Take 1 tablet by mouth daily.     lisinopril (ZESTRIL) 10 MG tablet Take 1 tablet (10 mg total) by mouth at bedtime. 90 tablet 3   Omega-3 Fatty Acids (FISH OIL) 1000 MG CAPS Take 1 capsule (1,000 mg total) by mouth daily.     Omeprazole-Sodium Bicarbonate (ZEGERID OTC) 20-1100 MG CAPS capsule Take 1 capsule by mouth daily before breakfast. (Patient taking differently: Take 1 capsule by mouth as needed.) 28 each 0   No facility-administered medications prior to visit.     Per HPI unless specifically indicated in ROS section below Review of Systems  Objective:  BP 134/78   Pulse 75   Temp (!) 97.4 F (36.3 C) (Temporal)   Ht 5\' 9"  (1.753 m)   Wt 210 lb (95.3 kg)   SpO2 96%   BMI 31.01 kg/m   Wt Readings from Last 3 Encounters:  08/09/22 210 lb (95.3 kg)  07/11/22 207 lb (93.9 kg)  05/26/22 214 lb (97.1 kg)      Physical Exam Vitals and nursing note reviewed.  Constitutional:      Appearance: Normal appearance. He is not ill-appearing.  HENT:     Head: Normocephalic and atraumatic.     Mouth/Throat:     Mouth: Mucous membranes are moist.     Pharynx: Oropharynx is clear. No oropharyngeal exudate or posterior oropharyngeal erythema.  Eyes:     Extraocular Movements: Extraocular movements intact.     Pupils: Pupils are equal, round, and reactive to light.  Cardiovascular:     Rate and Rhythm: Normal rate and regular rhythm.     Pulses: Normal pulses.     Heart sounds: Murmur (3/6 systolic at apex) heard.  Pulmonary:     Effort: Pulmonary effort is normal. No respiratory distress.     Breath sounds: No wheezing, rhonchi or rales.     Comments: Diminished breath sounds at left lung base with crackles Abdominal:     General: Bowel sounds are normal. There is no distension.     Palpations: Abdomen is soft. There is no mass.     Tenderness: There is no abdominal tenderness. There is no guarding or rebound.     Hernia: No hernia is present.   Musculoskeletal:     Cervical back: Normal range of motion and neck supple.     Right lower leg: No edema.     Left lower leg: No edema.  Lymphadenopathy:     Cervical: No cervical adenopathy.  Skin:    General: Skin is warm and dry.     Findings: No rash.  Neurological:     Mental Status: He is alert.  Psychiatric:        Mood and Affect: Mood normal.        Behavior: Behavior normal.       Results for orders placed or performed in visit on 08/09/22  POC COVID-19 BinaxNow  Result Value Ref Range   SARS Coronavirus 2 Ag Negative Negative   Echocardiogram 06/09/2022:  Normal LV systolic function with visual EF 55-60%. Left ventricle cavity  is normal in size. Moderate concentric hypertrophy of the left ventricle.  Normal global wall motion. Doppler evidence of grade II (pseudonormal)  diastolic dysfunction, elevated LAP. Calculated EF 56%.  Left atrial cavity is mild to moderately dilated at 41.6 ml/m^2.  Moderate (Grade II) mitral regurgitation. Mitral valve ring repair of the  mitral annulus. Wall-impinging MR jet color flow area.  Structurally normal tricuspid valve.  Mild tricuspid regurgitation. No  evidence of pulmonary hypertension.  Compared to 10/2021, MR has improved.   DG Chest 2 View CLINICAL DATA:  Left-sided pleuritic chest pain, history of mitral valve repair  EXAM: CHEST - 2 VIEW  COMPARISON:  03/15/2021  FINDINGS: Heart size is normal. Mitral valve annular prosthesis. Diffuse bilateral interstitial pulmonary opacity, more focal and heterogeneous in the left upper lobe. Osseous structures unremarkable.  IMPRESSION: 1. Diffuse bilateral interstitial pulmonary opacity, more focal and heterogeneous in the left upper lobe, concerning for multifocal infection. 2. Mitral valve annular prosthesis.  Electronically Signed   By: Jearld Lesch M.D.   On: 08/09/2022 11:07   Assessment & Plan:   Problem List Items Addressed This Visit     Chest pain  on breathing    Overall atypical presentation for pneumonia - no significant productive cough or fever. CXR today r/o pneumonia.  CXR concerning for multifocal pneumonia - check COVID swab today (negative), send off RSV as well. Rx augmentin/azithromycin course to cover CAP.  If ongoing, low threshold to return to cardiology in h/o MV repair.  No reproducible chest wall pain pointing against msk cause.       Relevant Orders   DG Chest 2 View (Completed)   RSV, NAA   POC COVID-19 BinaxNow (Completed)   S/P MVR (mitral valve repair)    Latest echo 05/2022 showed  moderate MR, overall improvement after MV ring repair.       Other Visit Diagnoses     Multifocal pneumonia    -  Primary   Relevant Medications   azithromycin (ZITHROMAX) 250 MG tablet   amoxicillin-clavulanate (AUGMENTIN) 875-125 MG tablet   Other Relevant Orders   RSV, NAA   Pleurisy       Relevant Orders   DG Chest 2 View (Completed)        Meds ordered this encounter  Medications   azithromycin (ZITHROMAX) 250 MG tablet    Sig: Take 2 tablets on day 1, then 1 tablet daily on days 2 through 5    Dispense:  6 tablet    Refill:  0   amoxicillin-clavulanate (AUGMENTIN) 875-125 MG tablet    Sig: Take 1 tablet by mouth 2 (two) times daily for 10 days.    Dispense:  20 tablet    Refill:  0    Orders Placed This Encounter  Procedures   DG Chest 2 View    Standing Status:   Future    Number of Occurrences:   1    Standing Expiration Date:   08/09/2023    Order Specific Question:   Reason for Exam (SYMPTOM  OR DIAGNOSIS REQUIRED)    Answer:   left pleuritic chest pain in h/o mitral valve repair    Order Specific Question:   Preferred imaging location?    Answer:   [facility]   RSV, NAA   POC COVID-19 BinaxNow    Order Specific Question:   Previously tested for COVID-19    Answer:   No    Order Specific Question:   Resident in a congregate (group) care setting    Answer:   No    Order Specific  Question:   Employed in healthcare setting    Answer:   No    Patient Instructions  Return for physical at your convenience.  Chest x-ray today.  COVID test negative. Take antibiotics prescribed today.  Let us know if not improving with treatment.   Follow up plan: Return if symptoms worsen or fail to improve.  [name], MD

## 2022-08-11 ENCOUNTER — Other Ambulatory Visit: Payer: Self-pay | Admitting: Family Medicine

## 2022-08-11 DIAGNOSIS — J189 Pneumonia, unspecified organism: Secondary | ICD-10-CM

## 2022-08-11 LAB — RSV, NAA: RSV, NAA: NOT DETECTED

## 2022-08-29 ENCOUNTER — Encounter: Payer: Self-pay | Admitting: Cardiology

## 2022-08-29 ENCOUNTER — Ambulatory Visit: Payer: PPO | Admitting: Cardiology

## 2022-08-29 VITALS — BP 124/74 | HR 83 | Ht 69.0 in | Wt 194.0 lb

## 2022-08-29 DIAGNOSIS — I5031 Acute diastolic (congestive) heart failure: Secondary | ICD-10-CM | POA: Diagnosis not present

## 2022-08-29 DIAGNOSIS — I340 Nonrheumatic mitral (valve) insufficiency: Secondary | ICD-10-CM | POA: Diagnosis not present

## 2022-08-29 DIAGNOSIS — I10 Essential (primary) hypertension: Secondary | ICD-10-CM | POA: Diagnosis not present

## 2022-08-29 DIAGNOSIS — Z9889 Other specified postprocedural states: Secondary | ICD-10-CM | POA: Diagnosis not present

## 2022-08-29 MED ORDER — FUROSEMIDE 20 MG PO TABS
20.0000 mg | ORAL_TABLET | Freq: Every day | ORAL | 3 refills | Status: DC
Start: 2022-08-29 — End: 2022-09-21

## 2022-08-29 MED ORDER — POTASSIUM CHLORIDE CRYS ER 20 MEQ PO TBCR
20.0000 meq | EXTENDED_RELEASE_TABLET | Freq: Every day | ORAL | 3 refills | Status: DC
Start: 2022-08-29 — End: 2022-09-11

## 2022-08-29 NOTE — H&P (View-Only) (Signed)
Primary Physician/Referring:  [name], MD  Patient ID: Ryan Mathews, male    DOB: [birth year], 77 y.o.   MRN: [id number]  Chief Complaint  Patient presents with   Primary hypertension   Follow-up    HPI:    Ryan Mathews  is a 77 y.o. 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  He was evaluated by Dr. Sharen Hones and felt that he has had recurrence of mitral regurgitation.  Patient presently is dyspneic even doing minimal activities and also short of breath when he lays down.  On further questioning, patient has been having low-grade temperature at night, marked diaphoresis at night and overall feels extremely poorly.  He is accompanied by his wife.  Past Medical History:  Diagnosis Date   Bilateral inguinal hernia (BIH) s/p lap repair 09/06/2012 08/09/2012   Cataract    CMC arthritis, thumb, degenerative    right   COPD (chronic obstructive pulmonary disease) (HCC)    Emphysema of lung (HCC)    Ex-smoker    GERD (gastroesophageal reflux disease)    History of chicken pox    History of pneumonia 08/06/2009        Hyperlipidemia    Hypertension    Nasal septal deviation 04/14/2017   Marked   Numbness    R cheek stays numb   Obesity (BMI 30-39.9) 08/09/2012   S/P MVR (mitral valve repair) 36 mm Simulus semi rigid band) via Heartport 04/29/2021 04/29/2021   S/p minimally invasive mitral valve repair (semirigid 36mm Simulus annuloplasty band) at [facility] (Dr Zebedee Iba) 04/29/2021. Followed locally by Dr Jacinto Halim. Has been recommended dental ppx.   Echo 05/2022 - normal LVEF 55-60%, mod concentric LVH, G2DD, normal wall motion, mild-mod dilated LA, mod MR with MV annular ring repair with wall impinging MR jet but improved, mild TR (Dr Melton Alar)   Past Surgical History:   Procedure Laterality Date   ANKLE SURGERY Left 1966   with tendon repair with stainless steel wire   CARDIAC CATHETERIZATION     COLONOSCOPY  07/2017   multiple tubular adenomas, rpt 3 yrs (Pyrtle)   INSERTION OF MESH Bilateral 09/06/2012   Procedure: INSERTION OF MESH;  Surgeon: Ardeth Sportsman, MD   LAPAROSCOPIC INGUINAL HERNIA WITH UMBILICAL HERNIA Bilateral 09/06/2012   Procedure: LAPAROSCOPIC exploration and repair of hernias in bellybutton and bilateral groins with mesh;  Surgeon: Ardeth Sportsman, MD   MICRODISCECTOMY LUMBAR Right 08/2020   L4/5 (Dr Maurice Small)   MITRAL VALVE REPAIR  04/29/2021   at [facility] Dr Zebedee Iba   RIGHT/LEFT HEART CATH AND CORONARY ANGIOGRAPHY N/A 02/16/2021   Procedure: RIGHT/LEFT HEART CATH AND CORONARY ANGIOGRAPHY;  Surgeon: Yates Decamp, MD;  Location: [facility] INVASIVE CV LAB;  Service: Cardiovascular;  Laterality: N/A;   TEE WITHOUT CARDIOVERSION N/A 02/12/2021   Procedure: TRANSESOPHAGEAL ECHOCARDIOGRAM (TEE);  Surgeon: Yates Decamp, MD;  Location: [facility] ENDOSCOPY;  Service: Cardiovascular;  Laterality: N/A;   Family History  Problem Relation Age of Onset   Cancer Mother        breast   Congenital heart disease Mother    Diabetes Mother    COPD Father    Cancer Sister        lung (minimal smoker)   Colon cancer Neg Hx    Esophageal cancer Neg Hx    Liver cancer  Neg Hx    Rectal cancer Neg Hx    Stomach cancer Neg Hx    Pancreatic cancer Neg Hx     Social History   Tobacco Use   Smoking status: Former    Packs/day: 2.00    Years: 40.00    Additional pack years: 0.00    Total pack years: 80.00    Types: Cigarettes    Start date: 03/14/1968    Quit date: 03/14/2006    Years since quitting: 16.4   Smokeless tobacco: Never  Substance Use Topics   Alcohol use: Yes    Comment: Occasionally   Marital Status: Married   ROS   Review of Systems  Constitutional: Positive for diaphoresis.  Cardiovascular:  Positive for dyspnea on exertion and orthopnea.  Negative for chest pain and leg swelling.  Respiratory:  Positive for cough.     Objective  Blood pressure 124/74, pulse 83, height 5\' 9"  (1.753 m), weight 194 lb (88 kg), SpO2 94 %.     08/29/2022    3:05 PM 08/09/2022   10:16 AM 07/11/2022   10:34 AM  Vitals with BMI  Height 5\' 9"  5\' 9"  5\' 9"   Weight 194 lbs 210 lbs 207 lbs  BMI 28.64 31 30.55  Systolic 124 134   Diastolic 74 78   Pulse 83 75     Blood pressure 124/74, pulse 83, height 5\' 9"  (1.753 m), weight 194 lb (88 kg), SpO2 94 %.  Physical Exam Neck:     Vascular: No carotid bruit or JVD.  Cardiovascular:     Rate and Rhythm: Normal rate and regular rhythm.     Pulses: Normal pulses and intact distal pulses.     Heart sounds: Murmur heard.     Blowing holosystolic murmur is present with a grade of 3/6 at the apex.  Pulmonary:     Effort: Pulmonary effort is normal.     Breath sounds: Examination of the right-lower field reveals rales. Examination of the left-lower field reveals rales. Rales present.  Abdominal:     General: Bowel sounds are normal.     Palpations: Abdomen is soft.  Musculoskeletal:     Right lower leg: Edema (1-2+) present.     Left lower leg: Edema (1-2+) present.    Laboratory examination:   Recent Labs    06/02/22 0745  NA 140  K 4.5  CL 103  CO2 23  GLUCOSE 120*  BUN 26  CREATININE 1.20  CALCIUM 9.1      Latest Ref Rng & Units 06/02/2022    7:45 AM 07/07/2021    7:40 AM 03/15/2021    9:58 PM  CMP  Glucose 70 - 99 mg/dL 161  97  096   BUN 8 - 27 mg/dL 26  19  27    Creatinine 0.76 - 1.27 mg/dL 0.45  4.09  8.11   Sodium 134 - 144 mmol/L 140  141  134   Potassium 3.5 - 5.2 mmol/L 4.5  4.5  4.0   Chloride 96 - 106 mmol/L 103  107  103   CO2 20 - 29 mmol/L 23  27  22    Calcium 8.6 - 10.2 mg/dL 9.1  9.0  9.4   Total Protein 6.0 - 8.3 g/dL  6.8    Total Bilirubin 0.2 - 1.2 mg/dL  0.3    Alkaline Phos 39 - 117 U/L  87    AST 0 - 37 U/L  20    ALT 0 -  53 U/L  15        Latest Ref  Rng & Units 03/15/2021    9:58 PM 02/16/2021   11:47 AM 02/16/2021   11:42 AM  CBC  WBC 4.0 - 10.5 K/uL 15.2     Hemoglobin 13.0 - 17.0 g/dL 19.1  47.8  29.5   Hematocrit 39.0 - 52.0 % 46.7  48.0  49.0   Platelets 150 - 400 K/uL 207      Lab Results  Component Value Date   CHOL 94 07/07/2021   HDL 35.10 (L) 07/07/2021   LDLCALC 37 07/07/2021   TRIG 114.0 07/07/2021   CHOLHDL 3 07/07/2021    Lab Results  Component Value Date   TSH 3.91 01/25/2021    BNP (last 3 results) No results for input(s): "BNP" in the last 8760 hours.  ProBNP (last 3 results) Recent Labs    08/30/22 1355  PROBNP 301    Radiology:   No results found.  Chest x-ray 01/25/2021: 1. Dramatic change in the appearance of the lungs, which could be indicative of interstitial lung disease, although lung volumes are normal. Accordingly, acute atypical infection is not excluded. Further clinical evaluation is recommended. Consideration for follow-up high-resolution chest CT is suggested if clinically appropriate. 2. Aortic atherosclerosis  Chest x-ray two-view 08/09/2022: 1. Diffuse bilateral interstitial pulmonary opacity, more focal and heterogeneous in the left upper lobe, concerning for multifocal infection. 2. Mitral valve annular prosthesis.  Cardiac Studies:   Right and left heart catheterization 02/16/2021: Normal hemodynamics. LM: Large vessel, mid to distal segment is mildly ectatic. LAD: Large vessel, gives origin to small diagonals, mild disease evident. LCx: Moderate caliber vessel, minimal disease. RI: Moderate caliber vessel.  Minimal ostial disease. RCA: Large-caliber vessel, dominant.  Minimal disease present.  Impression and recommendation: Patient will need mitral valve repair.    Carotid artery duplex  02/09/2021:  The bifurcation, internal, external and common carotid arteries reveal no  evidence of significant stenosis, bilaterally.  Antegrade right vertebral artery flow. Antegrade  left vertebral artery  flow.  Mitral valve repair 04/29/2021: By Dr. Zebedee Iba at [facility] MV repair (36 mm Simulus semi rigid band) via Heartport ADULT, VALVULOPLASTY, MITRAL VALVE, VIA HEARTPORT, WITH CARDIOPULMONARY BYPASS; RADICAL RECONSTRUCTION, WITH OR WITHOUT RING. REPLACEMENT MITRAL VALVE VIA HEART PORT N/A 04/29/2021   PCV ECHOCARDIOGRAM COMPLETE 06/09/2022  Narrative Echocardiogram 06/09/2022: Normal LV systolic function with visual EF 55-60%. Left ventricle cavity is normal in size. Moderate concentric hypertrophy of the left ventricle. Normal global wall motion. Doppler evidence of grade II (pseudonormal) diastolic dysfunction, elevated LAP. Calculated EF 56%. Left atrial cavity is mild to moderately dilated at 41.6 ml/m^2. Moderate (Grade II) mitral regurgitation. Mitral valve ring repair of the mitral annulus. Wall-impinging MR jet color flow area. Structurally normal tricuspid valve.  Mild tricuspid regurgitation. No evidence of pulmonary hypertension. Compared to 10/2021, MR has improved.   EKG:   EKG 08/29/2022: Normal sinus rhythm at rate of 86 bpm, normal axis, incomplete right bundle branch block.  Compared to 05/26/2022, no significant change. .  Allergies   No Known Allergies   Current Outpatient Medications:    Ascorbic Acid 500 MG CHEW, Chew 1 tablet by mouth daily., Disp: , Rfl:    aspirin EC 81 MG tablet, Take 81 mg by mouth daily. Swallow whole., Disp: , Rfl:    atorvastatin (LIPITOR) 20 MG tablet, TAKE 1 TABLET BY MOUTH EVERY DAY, Disp: 90 tablet, Rfl: 1   b complex vitamins  tablet, Take 1 tablet by mouth daily., Disp: , Rfl:    furosemide (LASIX) 20 MG tablet, Take 1 tablet (20 mg total) by mouth daily., Disp: 90 tablet, Rfl: 3   lisinopril (ZESTRIL) 10 MG tablet, Take 1 tablet (10 mg total) by mouth at bedtime., Disp: 90 tablet, Rfl: 3   Omega-3 Fatty Acids (FISH OIL) 1000 MG CAPS, Take 1 capsule (1,000 mg total) by mouth daily., Disp: ,  Rfl:    Omeprazole-Sodium Bicarbonate (ZEGERID OTC) 20-1100 MG CAPS capsule, Take 1 capsule by mouth daily before breakfast. (Patient taking differently: Take 1 capsule by mouth as needed.), Disp: 28 each, Rfl: 0   potassium chloride SA (KLOR-CON M20) 20 MEQ tablet, Take 1 tablet (20 mEq total) by mouth daily., Disp: 90 tablet, Rfl: 3   Assessment     ICD-10-CM   1. Acute heart failure with preserved ejection fraction ([name]) (HCC)  I50.31 Pro b natriuretic peptide (BNP)    furosemide (LASIX) 20 MG tablet    potassium chloride SA (KLOR-CON M20) 20 MEQ tablet    CANCELED: PCV ECHOCARDIOGRAM COMPLETE    2. Moderate to severe mitral regurgitation  I34.0 Pro b natriuretic peptide (BNP)    furosemide (LASIX) 20 MG tablet    potassium chloride SA (KLOR-CON M20) 20 MEQ tablet    CANCELED: PCV ECHOCARDIOGRAM COMPLETE    3. S/P MVR (mitral valve repair) 36 mm Simulus semi rigid band) via Heartport 04/29/2021  Z98.890 Pro b natriuretic peptide (BNP)    furosemide (LASIX) 20 MG tablet    potassium chloride SA (KLOR-CON M20) 20 MEQ tablet    CANCELED: PCV ECHOCARDIOGRAM COMPLETE    4. Primary hypertension  I10 EKG 12-Lead      There are no discontinued medications.   Meds ordered this encounter  Medications   furosemide (LASIX) 20 MG tablet    Sig: Take 1 tablet (20 mg total) by mouth daily.    Dispense:  90 tablet    Refill:  3   potassium chloride SA (KLOR-CON M20) 20 MEQ tablet    Sig: Take 1 tablet (20 mEq total) by mouth daily.    Dispense:  90 tablet    Refill:  3   Recommendations:   Ryan Mathews is a 77 y.o. 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  He was evaluated by Dr. Sharen Hones and felt that he has had recurrence of mitral regurgitation.  Patient presently is  dyspneic even doing minimal activities and also short of breath when he lays down.  1. Acute heart failure with preserved ejection fraction ([name]) (HCC) Patient has been having low-grade temperature at night, marked diaphoresis at night and overall feels extremely poorly.  I started him on Lasix and potassium supplements daily, discussed regarding food restriction, I will schedule him for urgent TEE to further evaluate the mitral valve, although no clinical evidence of endocarditis, with the above symptoms, I cannot exclude endocarditis.  I do not want to start him on any antibiotics.    He was originally treated with a course of antibiotics after chest x-ray on 08/09/2022 revealed bilateral diffuse interstitial pulmonary opacity especially in the left upper lobe, concerning for multifocal infection.    TEE has been scheduled for next week however I will try to get this done ASA (set up for 08/31/22).  This morning I called patient and his wife discussed with him and to proceed with TEE today and to keep NPO.  - Pro b natriuretic peptide (BNP) - furosemide (LASIX) 20 MG tablet; Take 1 tablet (20 mg total) by mouth daily.  Dispense: 90 tablet; Refill: 3 - potassium chloride SA (KLOR-CON M20) 20 MEQ tablet; Take 1 tablet (20 mEq total) by mouth daily.  Dispense: 90 tablet; Refill: 3  2. Moderate to severe mitral regurgitation I suspect he has had either endocarditis or failure of mitral valve repair. - Pro b natriuretic peptide (BNP) - furosemide (LASIX) 20 MG tablet; Take 1 tablet (20 mg total) by mouth daily.  Dispense: 90 tablet; Refill: 3 - potassium chloride SA (KLOR-CON M20) 20 MEQ tablet; Take 1 tablet (20 mEq total) by mouth daily.  Dispense: 90 tablet; Refill: 3  3. S/P MVR (mitral valve repair) 36 mm Simulus semi rigid band) via Heartport 04/29/2021 As dictated above.  Patient has not had any dental workup since mitral valve repair.  No obvious source of infection. - Pro b natriuretic  peptide (BNP) - furosemide (LASIX) 20 MG tablet; Take 1 tablet (20 mg total) by mouth daily.  Dispense: 90 tablet; Refill: 3 - potassium chloride SA (KLOR-CON M20) 20 MEQ tablet; Take 1 tablet (20 mEq total) by mouth daily.  Dispense: 90 tablet; Refill: 3  4. Primary hypertension Blood pressure is well-controlled. - EKG 12-Lead    Yates Decamp, MD, [facility] 08/31/2022, 6:51 AM Office: [phone] Fax: [phone] Pager: [phone]

## 2022-08-29 NOTE — Progress Notes (Unsigned)
Primary Physician/Referring:  [name], MD  Patient ID: Ryan Mathews, male    DOB: 1945/12/26, 77 y.o.   MRN: [id number]  Chief Complaint  Patient presents with   Primary hypertension   Follow-up    HPI:    Ryan Mathews  is a 77 y.o. Caucasian male with history of smoking and COPD, hypertension.  Patient presented to PCPs office 01/25/2021 with exertional dyspnea, PCP subsequently ordered echocardiogram which revealed hyperdynamic LV with LVEF of 70-75%, grade 2 diastolic dysfunction and severe mitral valve regurgitation with flail middle scallop of the posterior mitral leaflet concerning for chordal rupture. Patient was therefore referred to our office for urgent evaluation and management.    Patient subsequently underwent TEE and left/right heart catheterization, details below.  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 Jacinto Halim sent referral to [facility] cardiothoracic surgery for consideration of mitral valve repair/replacement.  Patient underwent mitral valve repair at [facility] by Dr. Zebedee Iba on 04/29/2021.   Patient presents today for 6 month follow up. Patient is hypertensive and states he was taken off his blood pressure medications by his PCP after his heart surgery. He states he is feeling well and is trying to get back into exercising, but the weather has inhibited his plans. He states when he was active, his blood pressure was normal 120-130 SBP noted on his home readings. Pt denies chest pain, shortness of breath, palpitations, orthopnea, PND, TIA/syncope.    Past Medical History:  Diagnosis Date   Bilateral inguinal hernia (BIH) s/p lap repair 09/06/2012 08/09/2012   Cataract    CMC arthritis, thumb, degenerative    right   COPD (chronic obstructive pulmonary disease) (HCC)    Emphysema of lung (HCC)    Ex-smoker    GERD (gastroesophageal reflux disease)    History of chicken pox    History of pneumonia 08/06/2009         Hyperlipidemia    Hypertension    Nasal septal deviation 04/14/2017   Marked   Numbness    R cheek stays numb   Obesity (BMI 30-39.9) 08/09/2012   Past Surgical History:  Procedure Laterality Date   ANKLE SURGERY Left 1966   with tendon repair with stainless steel wire   CARDIAC CATHETERIZATION     COLONOSCOPY  07/2017   multiple tubular adenomas, rpt 3 yrs ([facility])   INSERTION OF MESH Bilateral 09/06/2012   Procedure: INSERTION OF MESH;  Surgeon: Ardeth Sportsman, MD   LAPAROSCOPIC INGUINAL HERNIA WITH UMBILICAL HERNIA Bilateral 09/06/2012   Procedure: LAPAROSCOPIC exploration and repair of hernias in bellybutton and bilateral groins with mesh;  Surgeon: Ardeth Sportsman, MD   MICRODISCECTOMY LUMBAR Right 08/2020   L4/5 (Dr Maurice Small)   MITRAL VALVE REPAIR  04/29/2021   at [facility] Dr Zebedee Iba   RIGHT/LEFT HEART CATH AND CORONARY ANGIOGRAPHY N/A 02/16/2021   Procedure: RIGHT/LEFT HEART CATH AND CORONARY ANGIOGRAPHY;  Surgeon: Yates Decamp, MD;  Location: [facility] INVASIVE CV LAB;  Service: Cardiovascular;  Laterality: N/A;   TEE WITHOUT CARDIOVERSION N/A 02/12/2021   Procedure: TRANSESOPHAGEAL ECHOCARDIOGRAM (TEE);  Surgeon: Yates Decamp, MD;  Location: [facility] ENDOSCOPY;  Service: Cardiovascular;  Laterality: N/A;   Family History  Problem Relation Age of Onset   Cancer Mother        breast   Congenital heart disease Mother    Diabetes Mother    COPD Father    Cancer Sister        lung (  minimal smoker)   Colon cancer Neg Hx    Esophageal cancer Neg Hx    Liver cancer Neg Hx    Rectal cancer Neg Hx    Stomach cancer Neg Hx    Pancreatic cancer Neg Hx     Social History   Tobacco Use   Smoking status: Former    Packs/day: 2.00    Years: 40.00    Additional pack years: 0.00    Total pack years: 80.00    Types: Cigarettes    Start date: 03/14/1968    Quit date: 03/14/2006    Years since quitting: 16.4   Smokeless tobacco: Never  Substance Use Topics   Alcohol use: Yes    Comment:  Occasionally   Marital Status: Married   ROS   Review of Systems  Cardiovascular:  Negative for chest pain, dyspnea on exertion and leg swelling.    Objective  Blood pressure 124/74, pulse 83, height 5\' 9"  (1.753 m), weight 194 lb (88 kg), SpO2 94 %.     08/29/2022    3:05 PM 08/09/2022   10:16 AM 07/11/2022   10:34 AM  Vitals with BMI  Height 5\' 9"  5\' 9"  5\' 9"   Weight 194 lbs 210 lbs 207 lbs  BMI 28.64 31 30.55  Systolic 124 134   Diastolic 74 78   Pulse 83 75     Physical Exam Neck:     Vascular: No carotid bruit or JVD.  Cardiovascular:     Rate and Rhythm: Normal rate and regular rhythm.     Pulses: Normal pulses and intact distal pulses.     Heart sounds: Normal heart sounds.  Pulmonary:     Effort: Pulmonary effort is normal.     Breath sounds: Normal breath sounds.  Abdominal:     General: Bowel sounds are normal.     Palpations: Abdomen is soft.  Musculoskeletal:     Right lower leg: Edema present.     Left lower leg: Edema present.     Comments: Trace BLE Edema        Laboratory examination:   Recent Labs    06/02/22 0745  NA 140  K 4.5  CL 103  CO2 23  GLUCOSE 120*  BUN 26  CREATININE 1.20  CALCIUM 9.1       Latest Ref Rng & Units 06/02/2022    7:45 AM 07/07/2021    7:40 AM 03/15/2021    9:58 PM  CMP  Glucose 70 - 99 mg/dL 086  97  578   BUN 8 - 27 mg/dL 26  19  27    Creatinine 0.76 - 1.27 mg/dL 4.69  6.29  5.28   Sodium 134 - 144 mmol/L 140  141  134   Potassium 3.5 - 5.2 mmol/L 4.5  4.5  4.0   Chloride 96 - 106 mmol/L 103  107  103   CO2 20 - 29 mmol/L 23  27  22    Calcium 8.6 - 10.2 mg/dL 9.1  9.0  9.4   Total Protein 6.0 - 8.3 g/dL  6.8    Total Bilirubin 0.2 - 1.2 mg/dL  0.3    Alkaline Phos 39 - 117 U/L  87    AST 0 - 37 U/L  20    ALT 0 - 53 U/L  15        Latest Ref Rng & Units 03/15/2021    9:58 PM 02/16/2021   11:47 AM 02/16/2021   11:42 AM  CBC  WBC 4.0 - 10.5 K/uL 15.2     Hemoglobin 13.0 - 17.0 g/dL 16.1  09.6  04.5    Hematocrit 39.0 - 52.0 % 46.7  48.0  49.0   Platelets 150 - 400 K/uL 207      Lab Results  Component Value Date   CHOL 94 07/07/2021   HDL 35.10 (L) 07/07/2021   LDLCALC 37 07/07/2021   TRIG 114.0 07/07/2021   CHOLHDL 3 07/07/2021    Lab Results  Component Value Date   TSH 3.91 01/25/2021    External labs:    Radiology:   No results found.  Chest x-ray 01/25/2021: 1. Dramatic change in the appearance of the lungs, which could be indicative of interstitial lung disease, although lung volumes are normal. Accordingly, acute atypical infection is not excluded. Further clinical evaluation is recommended. Consideration for follow-up high-resolution chest CT is suggested if clinically appropriate. 2. Aortic atherosclerosis  Chest x-ray two-view 08/01/2022: 1. Diffuse bilateral interstitial pulmonary opacity, more focal and heterogeneous in the left upper lobe, concerning for multifocal infection. 2. Mitral valve annular prosthesis.  Cardiac Studies:   Right and left heart catheterization 02/16/2021: Normal hemodynamics. LM: Large vessel, mid to distal segment is mildly ectatic. LAD: Large vessel, gives origin to small diagonals, mild disease evident. LCx: Moderate caliber vessel, minimal disease. RI: Moderate caliber vessel.  Minimal ostial disease. RCA: Large-caliber vessel, dominant.  Minimal disease present.  Impression and recommendation: Patient will need mitral valve repair.    Carotid artery duplex  02/09/2021:  The bifurcation, internal, external and common carotid arteries reveal no  evidence of significant stenosis, bilaterally.  Antegrade right vertebral artery flow. Antegrade left vertebral artery  flow.  Mitral valve repair 04/29/2021: By Dr. Zebedee [facility]ba at [facility] MV repair (36 mm Simulus semi rigid band) via Heartport ADULT, VALVULOPLASTY, MITRAL VALVE, VIA HEARTPORT, WITH CARDIOPULMONARY BYPASS; RADICAL RECONSTRUCTION, WITH OR WITHOUT RING.  REPLACEMENT MITRAL VALVE VIA HEART PORT N/A 04/29/2021   PCV ECHOCARDIOGRAM COMPLETE 06/09/2022  Narrative Echocardiogram 06/09/2022: Normal LV systolic function with visual EF 55-60%. Left ventricle cavity is normal in size. Moderate concentric hypertrophy of the left ventricle. Normal global wall motion. Doppler evidence of grade II (pseudonormal) diastolic dysfunction, elevated LAP. Calculated EF 56%. Left atrial cavity is mild to moderately dilated at 41.6 ml/m^2. Moderate (Grade II) mitral regurgitation. Mitral valve ring repair of the mitral annulus. Wall-impinging MR jet color flow area. Structurally normal tricuspid valve.  Mild tricuspid regurgitation. No evidence of pulmonary hypertension. Compared to 10/2021, MR has improved.    EKG:   05/31/2021: Sinus rhythm rate of 94 bpm.  Left axis, left anterior fascicular block.  Incomplete right bundle branch block.  Nonspecific T wave abnormality.  No evidence of ischemia or underlying injury pattern.  Compared EKG 02/01/2021, no significant change.  Allergies   No Known Allergies   Current Outpatient Medications:    Ascorbic Acid 500 MG CHEW, Chew 1 tablet by mouth daily., Disp: , Rfl:    aspirin EC 81 MG tablet, Take 81 mg by mouth daily. Swallow whole., Disp: , Rfl:    atorvastatin (LIPITOR) 20 MG tablet, TAKE 1 TABLET BY MOUTH EVERY DAY, Disp: 90 tablet, Rfl: 1   b complex vitamins tablet, Take 1 tablet by mouth daily., Disp: , Rfl:    lisinopril (ZESTRIL) 10 MG tablet, Take 1 tablet (10 mg total) by mouth at bedtime., Disp: 90 tablet, Rfl: 3   Omega-3 Fatty Acids (FISH OIL) 1000 MG CAPS,  Take 1 capsule (1,000 mg total) by mouth daily., Disp: , Rfl:    Omeprazole-Sodium Bicarbonate (ZEGERID OTC) 20-1100 MG CAPS capsule, Take 1 capsule by mouth daily before breakfast. (Patient taking differently: Take 1 capsule by mouth as needed.), Disp: 28 each, Rfl: 0   Assessment     ICD-10-CM   1. Primary hypertension  I10 EKG 12-Lead        There are no discontinued medications.    No orders of the defined types were placed in this encounter.    Recommendations:   Ryan Mathews is a 77 y.o. hypertension, obesity referred to me on an urgent basis for evaluation of mitral regurgitation and heart failure.  Patient presents today for 6 month follow up. Patient is hypertensive and states he was taken off his blood pressure medications by his PCP after his heart surgery. He states he is feeling well and is trying to get back into exercising, but the weather has inhibited his plans. He states when he was active, his blood pressure was normal 120-130 SBP noted on his home readings. Pt denies chest pain, shortness of breath, palpitations, orthopnea, PND, TIA/syncope.   1. S/P mitral valve repair Plan - Continue current cardiac medications - Start lisinopril 10 mg Po Daily at night - discussed to use this medication to treat his high blood pressure and once he is back exercising and notices his blood pressure to be back in his normal range, then the lisinopril can be stopped. Encouraged to continue to monitor home blood pressures.  - BMP ordered  - Encourage low-sodium diet, less than 2000 mg daily. - Schedule imaging tests in office, may can change to annual ECHO - Will need to come back in 6 months for blood pressure and weight loss evaluation   Follow-up in 6 months or sooner if needed.     Royston Bake, NP-S  Patient seen with Royston Bake, NP-S. I independently reviewed the chart and examined the patient. I agree with assessment & plan as documented by above.   Rozell Searing Custovic  DO, [facility] 08/29/2022, 3:54 PM Office: [phone]

## 2022-08-30 DIAGNOSIS — I5031 Acute diastolic (congestive) heart failure: Secondary | ICD-10-CM | POA: Diagnosis not present

## 2022-08-30 DIAGNOSIS — I340 Nonrheumatic mitral (valve) insufficiency: Secondary | ICD-10-CM | POA: Diagnosis not present

## 2022-08-30 DIAGNOSIS — Z9889 Other specified postprocedural states: Secondary | ICD-10-CM | POA: Diagnosis not present

## 2022-08-31 ENCOUNTER — Other Ambulatory Visit: Payer: Self-pay

## 2022-08-31 ENCOUNTER — Encounter (HOSPITAL_COMMUNITY)
Admission: RE | Disposition: A | Discharge status: Home / Self Care | Payer: Self-pay | Source: Ambulatory Visit | Attending: Cardiology

## 2022-08-31 ENCOUNTER — Ambulatory Visit (HOSPITAL_COMMUNITY): Payer: PPO

## 2022-08-31 ENCOUNTER — Ambulatory Visit (HOSPITAL_COMMUNITY): Payer: PPO | Admitting: Certified Registered Nurse Anesthetist

## 2022-08-31 ENCOUNTER — Ambulatory Visit (HOSPITAL_COMMUNITY)
Admission: RE | Admit: 2022-08-31 | Discharge: 2022-08-31 | Disposition: A | Discharge status: Home / Self Care | Payer: PPO | Source: Ambulatory Visit | Attending: Cardiology | Admitting: Cardiology

## 2022-08-31 ENCOUNTER — Ambulatory Visit (HOSPITAL_BASED_OUTPATIENT_CLINIC_OR_DEPARTMENT_OTHER): Payer: PPO | Admitting: Certified Registered Nurse Anesthetist

## 2022-08-31 DIAGNOSIS — I700 Atherosclerosis of aorta: Secondary | ICD-10-CM | POA: Insufficient documentation

## 2022-08-31 DIAGNOSIS — I340 Nonrheumatic mitral (valve) insufficiency: Secondary | ICD-10-CM | POA: Diagnosis not present

## 2022-08-31 DIAGNOSIS — Z87891 Personal history of nicotine dependence: Secondary | ICD-10-CM | POA: Diagnosis not present

## 2022-08-31 DIAGNOSIS — J449 Chronic obstructive pulmonary disease, unspecified: Secondary | ICD-10-CM | POA: Insufficient documentation

## 2022-08-31 DIAGNOSIS — Z95818 Presence of other cardiac implants and grafts: Secondary | ICD-10-CM | POA: Diagnosis not present

## 2022-08-31 DIAGNOSIS — Z79899 Other long term (current) drug therapy: Secondary | ICD-10-CM | POA: Diagnosis not present

## 2022-08-31 DIAGNOSIS — I5031 Acute diastolic (congestive) heart failure: Secondary | ICD-10-CM | POA: Insufficient documentation

## 2022-08-31 DIAGNOSIS — I110 Hypertensive heart disease with heart failure: Secondary | ICD-10-CM | POA: Insufficient documentation

## 2022-08-31 DIAGNOSIS — I509 Heart failure, unspecified: Secondary | ICD-10-CM | POA: Diagnosis not present

## 2022-08-31 DIAGNOSIS — Z9889 Other specified postprocedural states: Secondary | ICD-10-CM

## 2022-08-31 DIAGNOSIS — I059 Rheumatic mitral valve disease, unspecified: Secondary | ICD-10-CM

## 2022-08-31 DIAGNOSIS — I081 Rheumatic disorders of both mitral and tricuspid valves: Secondary | ICD-10-CM | POA: Diagnosis not present

## 2022-08-31 DIAGNOSIS — I3139 Other pericardial effusion (noninflammatory): Secondary | ICD-10-CM | POA: Insufficient documentation

## 2022-08-31 HISTORY — PX: TEE WITHOUT CARDIOVERSION: SHX5443

## 2022-08-31 LAB — POCT I-STAT, CHEM 8
BUN: 24 mg/dL — ABNORMAL HIGH (ref 8–23)
Calcium, Ion: 1.19 mmol/L (ref 1.15–1.40)
Chloride: 104 mmol/L (ref 98–111)
Creatinine, Ser: 1.1 mg/dL (ref 0.61–1.24)
Glucose, Bld: 96 mg/dL (ref 70–99)
HCT: 42 % (ref 39.0–52.0)
Hemoglobin: 14.3 g/dL (ref 13.0–17.0)
Potassium: 4.5 mmol/L (ref 3.5–5.1)
Sodium: 138 mmol/L (ref 135–145)
TCO2: 26 mmol/L (ref 22–32)

## 2022-08-31 LAB — ECHO TEE
Area-P 1/2: 3.39 cm2
MV M vel: 4.37 m/s
MV Peak grad: 76.4 mmHg
MV VTI: 0.67 cm2
Radius: 0.83 cm

## 2022-08-31 LAB — PRO B NATRIURETIC PEPTIDE: NT-Pro BNP: 301 pg/mL (ref 0–486)

## 2022-08-31 SURGERY — ECHOCARDIOGRAM, TRANSESOPHAGEAL
Anesthesia: Monitor Anesthesia Care

## 2022-08-31 MED ORDER — SODIUM CHLORIDE 0.9 % IV SOLN: INTRAVENOUS | Status: DC

## 2022-08-31 MED ORDER — PHENYLEPHRINE HCL-NACL 20-0.9 MG/250ML-% IV SOLN
INTRAVENOUS | Status: DC | PRN
  Administered 2022-08-31: 50 ug/min via INTRAVENOUS

## 2022-08-31 MED ORDER — EPHEDRINE SULFATE (PRESSORS) 50 MG/ML IJ SOLN
INTRAMUSCULAR | Status: DC | PRN
  Administered 2022-08-31: 10 mg via INTRAVENOUS

## 2022-08-31 MED ORDER — PROPOFOL 500 MG/50ML IV EMUL
INTRAVENOUS | Status: DC | PRN
  Administered 2022-08-31: 150 ug/kg/min via INTRAVENOUS

## 2022-08-31 MED ORDER — ALBUTEROL SULFATE HFA 108 (90 BASE) MCG/ACT IN AERS
INHALATION_SPRAY | RESPIRATORY_TRACT | Status: DC | PRN
  Administered 2022-08-31: 6 via RESPIRATORY_TRACT
  Administered 2022-08-31: 2 via RESPIRATORY_TRACT

## 2022-08-31 MED ORDER — SODIUM CHLORIDE 0.9 % IV BOLUS
250.0000 mL | Freq: Once | INTRAVENOUS | Status: AC
  Administered 2022-08-31: 250 mL via INTRAVENOUS

## 2022-08-31 MED ORDER — PROPOFOL 10 MG/ML IV BOLUS
INTRAVENOUS | Status: DC | PRN
  Administered 2022-08-31: 60 mg via INTRAVENOUS

## 2022-08-31 NOTE — Discharge Instructions (Signed)

## 2022-08-31 NOTE — Transfer of Care (Addendum)
Immediate Anesthesia Transfer of Care Note  Patient: Ryan Mathews  Procedure(s) Performed: TRANSESOPHAGEAL ECHOCARDIOGRAM  Patient Location: Cath Lab  Anesthesia Type:MAC  Level of Consciousness: awake and patient cooperative  Airway & Oxygen Therapy: Patient Spontanous Breathing and Patient connected to nasal cannula oxygen  Post-op Assessment: Report given to RN and Post -op Vital signs reviewed and stable  Post vital signs: Reviewed and stable  Last Vitals:  Vitals Value Taken Time  BP 95/55 08/31/22 1408  Temp    Pulse 82 08/31/22 1408  Resp 16 08/31/22 1408  SpO2 97 % 08/31/22 1408    Last Pain:  Vitals:   08/31/22 1408  TempSrc:   PainSc: 0-No pain         Complications: No notable events documented.

## 2022-08-31 NOTE — Interval H&P Note (Signed)
History and Physical Interval Note:  08/31/2022 12:30 PM  Ryan Mathews  has presented today for surgery, with the diagnosis of severe MR.  The various methods of treatment have been discussed with the patient and family. After consideration of risks, benefits and other options for treatment, the patient has consented to  Procedure(s): TRANSESOPHAGEAL ECHOCARDIOGRAM (N/A) as a surgical intervention.  The patient's history has been reviewed, patient examined, no change in status, stable for surgery.  I have reviewed the patient's chart and labs.  Questions were answered to the patient's satisfaction.    To evaluate the mitral valve ring and severity of MR and concern for endocarditis.   After careful review of history and examination, the risks, benefits of transesophageal echocardiogram, and alternatives have been explained to the patient. Complications include but not limited to esophageal perforation (rare), gastrointestinal bleeding (rare), cardiac arrhythmia which can include cardiac arrest and death (rare), pharyngeal irritation / discomfort with swallowing / hematoma, methemoglobinemia, bronchospasm, transient hypoxia, nonsustained ventricular tachycardia, transient atrial fibrillation, minimal hemoptysis, vomiting, hypotension, respiratory compromise, reaction to medications, unavoidable damage to teeth and gums, aspiration pneumonia  were reviewed with the patient.  Patient voices understands, provides verbal feedback, questions answered, and patient wishes to proceed with the procedure.  Tessa Lerner, [place], [facility]  Pager:  [phone] Office: [phone]

## 2022-08-31 NOTE — CV Procedure (Signed)
Transesophageal echocardiogram (TEE) : Preliminary report 08/31/22  Sedation: See anesthesia records.   TEE was performed without complications   LV: Normal EF. RV: Normal structure and function.  LA: Grossly normal.  Spontaneous echo contrast was not present.  No thrombus present. Left atrial appendage: Spontaneous echo contrast was not  present.  No thrombus present. Inter atrial septum is intact without defect. Double contrast study negative for atrial level shunting. RA: Grossly normal.   Pericardium: small effusion.    MV: s/p mitral valve repair, moderate regurgitation (further evaluation pending), eccentric jet, no stenosis, no vegetation noted.  TV: mild regurgitation, no stenosis, no vegetation noted. AV: no regurgitation,no stenosis, no vegetation noted.   PV: no regurgitation,no stenosis, no vegetation noted.   Thoracic and ascending aorta: Mild plaque.  Final report forthcoming.  Patient awake and alert moving 4 extermities. Preliminary findings discussed Wife updated.    Tessa Lerner, [place], [facility]  Pager:  [phone] Office: [phone]

## 2022-09-01 ENCOUNTER — Encounter (HOSPITAL_COMMUNITY): Payer: Self-pay | Admitting: Cardiology

## 2022-09-02 ENCOUNTER — Telehealth: Payer: Self-pay

## 2022-09-02 NOTE — Telephone Encounter (Signed)
Pt's spouse called wanting to know if f/u appt needed to be moved up since procedure date was changed or if ok to leave on 7/11.

## 2022-09-02 NOTE — Telephone Encounter (Signed)
No keep same

## 2022-09-05 ENCOUNTER — Emergency Department (HOSPITAL_BASED_OUTPATIENT_CLINIC_OR_DEPARTMENT_OTHER): Payer: PPO

## 2022-09-05 ENCOUNTER — Emergency Department (HOSPITAL_BASED_OUTPATIENT_CLINIC_OR_DEPARTMENT_OTHER): Payer: PPO | Admitting: Radiology

## 2022-09-05 ENCOUNTER — Inpatient Hospital Stay (HOSPITAL_BASED_OUTPATIENT_CLINIC_OR_DEPARTMENT_OTHER)
Admission: EM | Admit: 2022-09-05 | Discharge: 2022-09-11 | DRG: 194 | Disposition: A | Discharge status: Home / Self Care | Payer: PPO | Attending: Internal Medicine | Admitting: Internal Medicine

## 2022-09-05 ENCOUNTER — Encounter: Payer: Self-pay | Admitting: Family Medicine

## 2022-09-05 ENCOUNTER — Ambulatory Visit: Payer: PPO | Admitting: Family Medicine

## 2022-09-05 ENCOUNTER — Encounter (HOSPITAL_BASED_OUTPATIENT_CLINIC_OR_DEPARTMENT_OTHER): Payer: Self-pay

## 2022-09-05 ENCOUNTER — Other Ambulatory Visit: Payer: Self-pay

## 2022-09-05 ENCOUNTER — Ambulatory Visit (INDEPENDENT_AMBULATORY_CARE_PROVIDER_SITE_OTHER): Payer: PPO | Admitting: Family Medicine

## 2022-09-05 ENCOUNTER — Ambulatory Visit (INDEPENDENT_AMBULATORY_CARE_PROVIDER_SITE_OTHER)
Admission: RE | Admit: 2022-09-05 | Discharge: 2022-09-05 | Disposition: A | Discharge status: Home / Self Care | Payer: PPO | Source: Ambulatory Visit | Attending: Family Medicine | Admitting: Family Medicine

## 2022-09-05 ENCOUNTER — Telehealth: Payer: Self-pay | Admitting: Family Medicine

## 2022-09-05 VITALS — BP 134/66 | HR 87 | Temp 97.4°F | Ht 69.0 in | Wt 190.1 lb

## 2022-09-05 DIAGNOSIS — Z6820 Body mass index (BMI) 20.0-20.9, adult: Secondary | ICD-10-CM

## 2022-09-05 DIAGNOSIS — R0602 Shortness of breath: Secondary | ICD-10-CM | POA: Diagnosis not present

## 2022-09-05 DIAGNOSIS — R61 Generalized hyperhidrosis: Secondary | ICD-10-CM

## 2022-09-05 DIAGNOSIS — Z809 Family history of malignant neoplasm, unspecified: Secondary | ICD-10-CM

## 2022-09-05 DIAGNOSIS — K219 Gastro-esophageal reflux disease without esophagitis: Secondary | ICD-10-CM | POA: Diagnosis present

## 2022-09-05 DIAGNOSIS — M19041 Primary osteoarthritis, right hand: Secondary | ICD-10-CM | POA: Diagnosis present

## 2022-09-05 DIAGNOSIS — R197 Diarrhea, unspecified: Secondary | ICD-10-CM | POA: Diagnosis not present

## 2022-09-05 DIAGNOSIS — J439 Emphysema, unspecified: Secondary | ICD-10-CM | POA: Diagnosis not present

## 2022-09-05 DIAGNOSIS — R918 Other nonspecific abnormal finding of lung field: Secondary | ICD-10-CM | POA: Diagnosis not present

## 2022-09-05 DIAGNOSIS — I059 Rheumatic mitral valve disease, unspecified: Secondary | ICD-10-CM | POA: Diagnosis not present

## 2022-09-05 DIAGNOSIS — E669 Obesity, unspecified: Secondary | ICD-10-CM | POA: Diagnosis present

## 2022-09-05 DIAGNOSIS — R6883 Chills (without fever): Secondary | ICD-10-CM

## 2022-09-05 DIAGNOSIS — Z9889 Other specified postprocedural states: Secondary | ICD-10-CM

## 2022-09-05 DIAGNOSIS — J189 Pneumonia, unspecified organism: Secondary | ICD-10-CM

## 2022-09-05 DIAGNOSIS — R634 Abnormal weight loss: Secondary | ICD-10-CM | POA: Insufficient documentation

## 2022-09-05 DIAGNOSIS — I5032 Chronic diastolic (congestive) heart failure: Secondary | ICD-10-CM | POA: Diagnosis present

## 2022-09-05 DIAGNOSIS — I110 Hypertensive heart disease with heart failure: Secondary | ICD-10-CM | POA: Diagnosis present

## 2022-09-05 DIAGNOSIS — Z8249 Family history of ischemic heart disease and other diseases of the circulatory system: Secondary | ICD-10-CM

## 2022-09-05 DIAGNOSIS — Z87891 Personal history of nicotine dependence: Secondary | ICD-10-CM

## 2022-09-05 DIAGNOSIS — E785 Hyperlipidemia, unspecified: Secondary | ICD-10-CM | POA: Diagnosis present

## 2022-09-05 DIAGNOSIS — Z836 Family history of other diseases of the respiratory system: Secondary | ICD-10-CM

## 2022-09-05 DIAGNOSIS — Z79899 Other long term (current) drug therapy: Secondary | ICD-10-CM

## 2022-09-05 DIAGNOSIS — Z803 Family history of malignant neoplasm of breast: Secondary | ICD-10-CM

## 2022-09-05 DIAGNOSIS — Z952 Presence of prosthetic heart valve: Secondary | ICD-10-CM

## 2022-09-05 DIAGNOSIS — E440 Moderate protein-calorie malnutrition: Secondary | ICD-10-CM | POA: Diagnosis present

## 2022-09-05 DIAGNOSIS — R059 Cough, unspecified: Secondary | ICD-10-CM | POA: Diagnosis not present

## 2022-09-05 DIAGNOSIS — R053 Chronic cough: Secondary | ICD-10-CM | POA: Diagnosis not present

## 2022-09-05 DIAGNOSIS — Z6828 Body mass index (BMI) 28.0-28.9, adult: Secondary | ICD-10-CM

## 2022-09-05 DIAGNOSIS — M1811 Unilateral primary osteoarthritis of first carpometacarpal joint, right hand: Secondary | ICD-10-CM | POA: Diagnosis present

## 2022-09-05 DIAGNOSIS — Z7982 Long term (current) use of aspirin: Secondary | ICD-10-CM

## 2022-09-05 DIAGNOSIS — Z833 Family history of diabetes mellitus: Secondary | ICD-10-CM

## 2022-09-05 DIAGNOSIS — Z8701 Personal history of pneumonia (recurrent): Secondary | ICD-10-CM

## 2022-09-05 DIAGNOSIS — R0609 Other forms of dyspnea: Secondary | ICD-10-CM

## 2022-09-05 DIAGNOSIS — J440 Chronic obstructive pulmonary disease with acute lower respiratory infection: Secondary | ICD-10-CM | POA: Diagnosis present

## 2022-09-05 DIAGNOSIS — Z825 Family history of asthma and other chronic lower respiratory diseases: Secondary | ICD-10-CM

## 2022-09-05 LAB — CBC
HCT: 41.4 % (ref 39.0–52.0)
Hemoglobin: 13.6 g/dL (ref 13.0–17.0)
MCH: 30.7 pg (ref 26.0–34.0)
MCHC: 32.9 g/dL (ref 30.0–36.0)
MCV: 93.5 fL (ref 80.0–100.0)
Platelets: 384 10*3/uL (ref 150–400)
RBC: 4.43 MIL/uL (ref 4.22–5.81)
RDW: 13.4 % (ref 11.5–15.5)
WBC: 14 10*3/uL — ABNORMAL HIGH (ref 4.0–10.5)
nRBC: 0 % (ref 0.0–0.2)

## 2022-09-05 LAB — BASIC METABOLIC PANEL
Anion gap: 11 (ref 5–15)
BUN: 31 mg/dL — ABNORMAL HIGH (ref 8–23)
CO2: 22 mmol/L (ref 22–32)
Calcium: 9.5 mg/dL (ref 8.9–10.3)
Chloride: 101 mmol/L (ref 98–111)
Creatinine, Ser: 1.24 mg/dL (ref 0.61–1.24)
GFR, Estimated: >60 mL/min (ref >60)
Glucose, Bld: 101 mg/dL — ABNORMAL HIGH (ref 70–99)
Potassium: 4.3 mmol/L (ref 3.5–5.1)
Sodium: 134 mmol/L — ABNORMAL LOW (ref 135–145)

## 2022-09-05 MED ORDER — SODIUM CHLORIDE 0.9 % IV SOLN
1.0000 g | Freq: Once | INTRAVENOUS | Status: AC
  Administered 2022-09-06: 1 g via INTRAVENOUS
  Filled 2022-09-05: qty 10

## 2022-09-05 MED ORDER — SODIUM CHLORIDE 0.9 % IV SOLN
500.0000 mg | Freq: Once | INTRAVENOUS | Status: AC
  Administered 2022-09-06: 500 mg via INTRAVENOUS
  Filled 2022-09-05: qty 5

## 2022-09-05 MED ORDER — IOHEXOL 300 MG/ML  SOLN
100.0000 mL | Freq: Once | INTRAMUSCULAR | Status: AC | PRN
  Administered 2022-09-05: 75 mL via INTRAVENOUS

## 2022-09-05 NOTE — Telephone Encounter (Signed)
Please add on at 3:45pm today

## 2022-09-05 NOTE — ED Triage Notes (Addendum)
Patient here POV from Home.  Endorses Cough present for about 1 Month. Given Antibiotics when Symptoms began for PNA. Symptoms improved mostly. 5 Days ago had a TEE which found "residual leakage" but ruled out infection in chest.   Seeks Evaluation for worsened SOB and was sent for Assessment by [facility] for Possible PNA. No Known fevers. Cough can be productive.   Obvious SOB during Triage. A&Ox4. GCS 15. Ambulatory

## 2022-09-05 NOTE — Assessment & Plan Note (Addendum)
Worsening exertional dyspnea.  Recent TEE echocardiogram with moderate MR, no vegetations.  CXR showing worsening LUL consolidation.  Consider contrasted CT for further eval, r/o mass vs atypical infection.  Recommend ER evaluation today - I called [facility] to notify charge nurse about patient, was told to send pt over.

## 2022-09-05 NOTE — Telephone Encounter (Signed)
Pt was added to schedule.

## 2022-09-05 NOTE — Assessment & Plan Note (Addendum)
Recent TEE without vegetation, recent bubble study normal.

## 2022-09-05 NOTE — Assessment & Plan Note (Deleted)
Recent TEE without vegetation.

## 2022-09-05 NOTE — Anesthesia Postprocedure Evaluation (Signed)
Anesthesia Post Note  Patient: Ryan Mathews  Procedure(s) Performed: TRANSESOPHAGEAL ECHOCARDIOGRAM     Patient location during evaluation: Cath Lab Anesthesia Type: MAC Level of consciousness: patient cooperative and awake Pain management: pain level controlled Vital Signs Assessment: post-procedure vital signs reviewed and stable Respiratory status: spontaneous breathing, nonlabored ventilation, respiratory function stable and patient connected to nasal cannula oxygen Cardiovascular status: stable and blood pressure returned to baseline Postop Assessment: no apparent nausea or vomiting Anesthetic complications: no   No notable events documented.  Last Vitals:  Vitals:   08/31/22 1358 08/31/22 1408  BP: 96/63 (!) 95/55  Pulse: 83 82  Resp: 17 16  Temp:    SpO2: 97% 97%    Last Pain:  Vitals:   08/31/22 1408  TempSrc:   PainSc: 0-No pain                 Joannah Gitlin

## 2022-09-05 NOTE — Progress Notes (Addendum)
Ph: [phone] Fax: [phone]   Patient ID: Ryan Mathews, male    DOB: 14-Sep-1945, 77 y.o.   MRN: [id number]  This visit was conducted in person.  BP 134/66   Pulse 87   Temp (!) 97.4 F (36.3 C) (Temporal)   Ht 5\' 9"  (1.753 m)   Wt 190 lb 2 oz (86.2 kg)   SpO2 95%   BMI 28.08 kg/m    CC: cough, shortness of breath and weight loss Subjective:   HPI: Ryan Mathews is a 77 y.o. male presenting on 09/05/2022 for Cough (C/o ongoing cough, wt loss and feeling weak. Also, c/o SOB and night sweats. Seen previously for sxs. Also, seen by cards. Told to rpt CXR in 4-6 wks. Pt accompanied by wife, Ryan Mathews. )   See prior notes for details.  Seen last month with multifocal pneumonia by CXR treated with augmentin and azithromycin course.  Never fully better after treatment, ongoing dyspnea, symptoms are worse in last few weeks. He's lost 20 lbs in the past month. No appetite, having chills and frequent night sweats, coughing fits with exertional dyspnea, but without significant sputum production. No fever.   H/o pneumonia 2011 S/p prevnar-13 10/2015, penumovax-23 04/2017.   H/o mitral valve repair at [facility] 04/2021. Completed cardiac rehab. [facility] cardiology Ryan Mathews), on lisinopril 10mg  daily. Most recently completed TEE 08/31/2022 at [facility] (Dr Ryan Mathews) - no vegetations, neg bubble study, moderate MR. Upcoming cardiology appt 09/22/2022/ Started on lasix 20mg  + potassium .   H/o COPD bullous emphysema by prior imaging, not on respiratory medication.  No h/o asthma or allergies.  Ex smoker - quit 2008.   BNP 300s 08/30/2022     Relevant past medical, surgical, family and social history reviewed and updated as indicated. Interim medical history since our last visit reviewed. Allergies and medications reviewed and updated. Outpatient Medications Prior to Visit  Medication Sig Dispense Refill   Ascorbic Acid 500 MG CHEW Chew 1 tablet by mouth daily.     aspirin  EC 81 MG tablet Take 81 mg by mouth daily. Swallow whole.     atorvastatin (LIPITOR) 20 MG tablet TAKE 1 TABLET BY MOUTH EVERY DAY 90 tablet 1   b complex vitamins tablet Take 1 tablet by mouth daily.     furosemide (LASIX) 20 MG tablet Take 1 tablet (20 mg total) by mouth daily. 90 tablet 3   lisinopril (ZESTRIL) 10 MG tablet Take 1 tablet (10 mg total) by mouth at bedtime. 90 tablet 3   Omega-3 Fatty Acids (FISH OIL) 1000 MG CAPS Take 1 capsule (1,000 mg total) by mouth daily.     Omeprazole-Sodium Bicarbonate (ZEGERID OTC) 20-1100 MG CAPS capsule Take 1 capsule by mouth daily before breakfast. (Patient taking differently: Take 1 capsule by mouth as needed.) 28 each 0   potassium chloride SA (KLOR-CON M20) 20 MEQ tablet Take 1 tablet (20 mEq total) by mouth daily. 90 tablet 3   No facility-administered medications prior to visit.     Per HPI unless specifically indicated in ROS section below Review of Systems  Objective:  BP 134/66   Pulse 87   Temp (!) 97.4 F (36.3 C) (Temporal)   Ht 5\' 9"  (1.753 m)   Wt 190 lb 2 oz (86.2 kg)   SpO2 95%   BMI 28.08 kg/m   Wt Readings from Last 3 Encounters:  09/05/22 190 lb 2 oz (86.2 kg)  08/31/22 190 lb (86.2 kg)  08/29/22 194  lb (88 kg)      Physical Exam Vitals and nursing note reviewed.  Constitutional:      Appearance: Normal appearance. He is ill-appearing.  HENT:     Mouth/Throat:     Mouth: Mucous membranes are moist.     Pharynx: Oropharynx is clear. No oropharyngeal exudate or posterior oropharyngeal erythema.  Eyes:     Extraocular Movements: Extraocular movements intact.     Pupils: Pupils are equal, round, and reactive to light.  Cardiovascular:     Rate and Rhythm: Normal rate and regular rhythm.     Pulses: Normal pulses.     Heart sounds: Murmur (3/6 systolic at apex) heard.  Pulmonary:     Effort: Tachypnea present.     Breath sounds: Decreased air movement (left lung) present. No stridor. Examination of the  left-middle field reveals rhonchi and rales. Rhonchi (L lung) and rales (L lung) present. No wheezing.     Comments: Diminished breath sounds at L>R bases, rales to inferior LUL Musculoskeletal:     Right lower leg: No edema.     Left lower leg: No edema.  Skin:    General: Skin is warm and dry.     Findings: No rash.  Neurological:     Mental Status: He is alert.  Psychiatric:        Mood and Affect: Mood normal.        Behavior: Behavior normal.       Results for orders placed or performed during the hospital encounter of 08/31/22  I-STAT, chem 8  Result Value Ref Range   Sodium 138 135 - 145 mmol/L   Potassium 4.5 3.5 - 5.1 mmol/L   Chloride 104 98 - 111 mmol/L   BUN 24 (H) 8 - 23 mg/dL   Creatinine, Ser 1.61 0.61 - 1.24 mg/dL   Glucose, Bld 96 70 - 99 mg/dL   Calcium, Ion 0.96 0.45 - 1.40 mmol/L   TCO2 26 22 - 32 mmol/L   Hemoglobin 14.3 13.0 - 17.0 g/dL   HCT 40.9 81.1 - 91.4 %  ECHO TEE  Result Value Ref Range   Area-P 1/2 3.39 cm2   MV M vel 4.37 m/s   Radius 0.83 cm   MV Peak grad 76.4 mmHg   MV VTI 0.67 cm2   Est EF 60 - 65%    Lab Results  Component Value Date   WBC 15.2 (H) 03/15/2021   HGB 14.3 08/31/2022   HCT 42.0 08/31/2022   MCV 93.4 03/15/2021   PLT 207 03/15/2021    Lab Results  Component Value Date   TSH 3.91 01/25/2021    Chest xray 2 view IMPRESSION 08/09/2022: 1. Diffuse bilateral interstitial pulmonary opacity, more focal and heterogeneous in the left upper lobe, concerning for multifocal infection. 2. Mitral valve annular prosthesis.  DG Chest 2 View CLINICAL DATA:  Chronic cough, chills, night sweats, 20 pound weight loss.  EXAM: CHEST - 2 VIEW  COMPARISON:  Chest x-ray dated Aug 09, 2022.  FINDINGS: Normal heart size. Prior mitral valve repair. Worsening patchy consolidation in the left upper lobe with new areas of pleural thickening and retraction of the left hilum. New mild patchy opacity in the superior segment of the  right lower lobe in the basilar left lower lobe. No pleural effusion or pneumothorax. No acute osseous abnormality.  IMPRESSION: 1. Progressive multifocal pneumonia, significantly worsened in the left upper lobe pneumonia with new areas of pleural thickening and retraction of the left  hilum. Atypical infections should be considered. CT chest with contrast may be helpful for further characterization.  Electronically Signed   By: Obie Dredge M.D.   On: 09/05/2022 16:45   Assessment & Plan:   Problem List Items Addressed This Visit     Bullous emphysema (HCC)   S/P MVR (mitral valve repair) 36 mm Simulus semi rigid band) via [facility] 04/29/2021   Dyspnea on exertion    Worsening exertional dyspnea.  Recent TEE echocardiogram with moderate MR, no vegetations.  CXR showing worsening LUL consolidation.  Consider contrasted CT for further eval, r/o mass vs atypical infection.  Recommend ER evaluation today - I called [facility] to notify charge nurse about patient, was told to send pt over.       Relevant Orders   Comprehensive metabolic panel   CBC with Differential/Platelet   QuantiFERON-TB Gold Plus   DG Chest 2 View (Completed)   Mitral valve disease    Recent TEE without vegetation, recent bubble study normal.       Multifocal pneumonia - Primary    Worsening symptoms in setting of presumed multifocal pneumonia treated last month with augmentin and azithromycin course.  RSV and COVID negative when tested last month. CXR today with worsening consolidation to left upper lobe.  Given associated shortness of breath and increased work of breathing, recommend further evaluation through ER for failed outpatient management of pneumonia.       Relevant Orders   CBC with Differential/Platelet   QuantiFERON-TB Gold Plus   DG Chest 2 View (Completed)   Unintended weight loss    Check labs       Relevant Orders   Comprehensive metabolic panel   TSH   CBC with  Differential/Platelet   QuantiFERON-TB Gold Plus   DG Chest 2 View (Completed)   Night sweats    Check labs including quantiferon-TB gold assay.       Relevant Orders   Comprehensive metabolic panel   TSH   CBC with Differential/Platelet   QuantiFERON-TB Gold Plus   DG Chest 2 View (Completed)   Other Visit Diagnoses     Chills            No orders of the defined types were placed in this encounter.   Orders Placed This Encounter  Procedures   DG Chest 2 View    Standing Status:   Future    Number of Occurrences:   1    Standing Expiration Date:   09/05/2023    Order Specific Question:   Reason for Exam (SYMPTOM  OR DIAGNOSIS REQUIRED)    Answer:   f/u multifocal pneumonia, ongoing cough, chills, night swetas, 20 lb weight loss in 1 month    Order Specific Question:   Preferred imaging location?    Answer:   [facility]   Comprehensive metabolic panel   TSH   CBC with Differential/Platelet   QuantiFERON-TB Gold Plus    Patient Instructions  Repeat chest xray today STAT  Labs today  Walking oxygen test today.  I recommend going to the [facility] ER for further evaluation. I will call charge nurse to tell them you're on your way.   Follow up plan: Return if symptoms worsen or fail to improve.  [name], MD

## 2022-09-05 NOTE — ED Notes (Signed)
Patient transported to CT 

## 2022-09-05 NOTE — Assessment & Plan Note (Signed)
Worsening symptoms in setting of presumed multifocal pneumonia treated last month with augmentin and azithromycin course.  RSV and COVID negative when tested last month. CXR today with worsening consolidation to left upper lobe.  Given associated shortness of breath and increased work of breathing, recommend further evaluation through ER for failed outpatient management of pneumonia.

## 2022-09-05 NOTE — Patient Instructions (Addendum)
Repeat chest xray today STAT  Labs today  Walking oxygen test today.  I recommend going to the [facility] ER for further evaluation. I will call charge nurse to tell them you're on your way.

## 2022-09-05 NOTE — Assessment & Plan Note (Signed)
Check labs 

## 2022-09-05 NOTE — ED Provider Notes (Signed)
[place] EMERGENCY DEPARTMENT AT [facility]  Provider Note  CSN: [id number] Arrival date & time: 09/05/22 1801  History Chief Complaint  Patient presents with   Shortness of Breath    Ryan Mathews is a 77 y.o. male with history of COPD, remote tobacco use, mitral valve repair last year began having cough and pleuritic back pain about a month ago.Seen at PCP office and had CXR showing multifocal PNA. Given a course of Augmentin and Zithromax without much improvement. Saw Cardiology with concerns for vegetation on his valve but TEE was neg for vegetation. He has continued to have dry cough, pleuritic pain and night sweats although no documented fever. Saw PCP again today where a repeat CXR was worsening. Sent to the ED for admission.    Home Medications Prior to Admission medications   Medication Sig Start Date End Date Taking? Authorizing Provider  Ascorbic Acid 500 MG CHEW Chew 1 tablet by mouth daily.    [provider]  aspirin EC 81 MG tablet Take 81 mg by mouth daily. Swallow whole.    [provider]  atorvastatin (LIPITOR) 20 MG tablet TAKE 1 TABLET BY MOUTH EVERY DAY 08/02/22   Custovic, Rozell Searing, DO  b complex vitamins tablet Take 1 tablet by mouth daily.    [provider]  furosemide (LASIX) 20 MG tablet Take 1 tablet (20 mg total) by mouth daily. 08/29/22 11/27/22  Yates Decamp, MD  lisinopril (ZESTRIL) 10 MG tablet Take 1 tablet (10 mg total) by mouth at bedtime. 05/26/22 09/05/22  Custovic, Rozell Searing, DO  Omega-3 Fatty Acids (FISH OIL) 1000 MG CAPS Take 1 capsule (1,000 mg total) by mouth daily. 05/12/20   [name], MD  Omeprazole-Sodium Bicarbonate (ZEGERID OTC) 20-1100 MG CAPS capsule Take 1 capsule by mouth daily before breakfast. Patient taking differently: Take 1 capsule by mouth as needed. 05/04/18   [name], MD  potassium chloride SA (KLOR-CON M20) 20 MEQ tablet Take 1 tablet (20 mEq total) by mouth daily. 08/29/22  11/27/22  Yates Decamp, MD     Allergies    Patient has no known allergies.   Review of Systems   Review of Systems Please see HPI for pertinent positives and negatives  Physical Exam BP 113/62   Pulse 94   Temp 98.5 F (36.9 C) (Oral)   Resp (!) 32   Ht 5\' 9"  (1.753 m)   Wt 86.2 kg   SpO2 93%   BMI 28.06 kg/m   Physical Exam Vitals and nursing note reviewed.  Constitutional:      Appearance: Normal appearance.  HENT:     Head: Normocephalic and atraumatic.     Nose: Nose normal.     Mouth/Throat:     Mouth: Mucous membranes are moist.  Eyes:     Extraocular Movements: Extraocular movements intact.     Conjunctiva/sclera: Conjunctivae normal.  Cardiovascular:     Rate and Rhythm: Normal rate.  Pulmonary:     Effort: Pulmonary effort is normal.     Breath sounds: Rhonchi present. No wheezing or rales.  Abdominal:     General: Abdomen is flat.     Palpations: Abdomen is soft.     Tenderness: There is no abdominal tenderness.  Musculoskeletal:        General: No swelling. Normal range of motion.     Cervical back: Neck supple.  Skin:    General: Skin is warm and dry.  Neurological:     General: No focal  deficit present.     Mental Status: He is alert.  Psychiatric:        Mood and Affect: Mood normal.     ED Results / Procedures / Treatments   EKG None  Procedures Procedures  Medications Ordered in the ED Medications  cefTRIAXone (ROCEPHIN) 1 g in sodium chloride 0.9 % 100 mL IVPB (0 g Intravenous Stopped 09/06/22 0049)  azithromycin (ZITHROMAX) 500 mg in sodium chloride 0.9 % 250 mL IVPB (500 mg Intravenous New Bag/Given 09/06/22 0039)  iohexol (OMNIPAQUE) 300 MG/ML solution 100 mL (75 mLs Intravenous Contrast Given 09/05/22 2326)    Initial Impression and Plan  Patient here with persistent multifocal pneumonia, failed outpatient treatment. Labs done in triage show CBC with mild leukocytosis, BMP is unremarkable. Will add blood cultures. I personally  viewed the images from radiology studies and agree with radiologist interpretation: CXR with multifocal PNA. Will send for CT to further evaluation. Abx started. Patient and family at bedside aware of plan for admission  ED Course   Clinical Course as of 09/06/22 0146  Tue Sep 06, 2022  0012 I personally viewed the images from radiology studies and agree with radiologist interpretation:  CT confirms findings of multifocal PNA. Will discuss admission with Hospitalist.  [CS]  [phone] Spoke with Dr. Imogene Burn, Hospitalist who will accept for admission.  [CS]    Clinical Course User Index [CS] Pollyann Savoy, MD     MDM Rules/Calculators/A&P Medical Decision Making Problems Addressed: Multifocal pneumonia: acute illness or injury  Amount and/or Complexity of Data Reviewed Labs: ordered. Decision-making details documented in ED Course. Radiology: ordered and independent interpretation performed. Decision-making details documented in ED Course.  Risk Prescription drug management. Decision regarding hospitalization.     Final Clinical Impression(s) / ED Diagnoses Final diagnoses:  Multifocal pneumonia    Rx / DC Orders ED Discharge Orders     None        Pollyann Savoy, MD 09/06/22 [phone]

## 2022-09-05 NOTE — Addendum Note (Signed)
Addended by: [name] on: 09/05/2022 05:29 PM   Modules accepted: Level of Service

## 2022-09-05 NOTE — Assessment & Plan Note (Signed)
Check labs including quantiferon-TB gold assay.

## 2022-09-05 NOTE — Anesthesia Preprocedure Evaluation (Signed)
Anesthesia Evaluation  Patient identified by MRN, date of birth, ID band Patient awake    Reviewed: Allergy & Precautions, NPO status , Patient's Chart, lab work & pertinent test results  History of Anesthesia Complications Negative for: history of anesthetic complications  Airway Mallampati: III  TM Distance: >3 FB Neck ROM: Full    Dental  (+) Dental Advisory Given   Pulmonary COPD, former smoker   breath sounds clear to auscultation       Cardiovascular hypertension, Pt. on medications +CHF  + Valvular Problems/Murmurs  Rhythm:Regular     Neuro/Psych  Neuromuscular disease    GI/Hepatic Neg liver ROS,GERD  Medicated,,  Endo/Other  negative endocrine ROS    Renal/GU negative Renal ROS     Musculoskeletal  (+) Arthritis ,    Abdominal   Peds  Hematology negative hematology ROS (+)   Anesthesia Other Findings   Reproductive/Obstetrics                             Anesthesia Physical Anesthesia Plan  ASA: 3  Anesthesia Plan: MAC   Post-op Pain Management: Minimal or no pain anticipated   Induction: Intravenous  PONV Risk Score and Plan: 1 and Propofol infusion and Treatment may vary due to age or medical condition  Airway Management Planned: Natural Airway, Nasal Cannula and Simple Face Mask  Additional Equipment: None  Intra-op Plan:   Post-operative Plan:   Informed Consent: I have reviewed the patients History and Physical, chart, labs and discussed the procedure including the risks, benefits and alternatives for the proposed anesthesia with the patient or authorized representative who has indicated his/her understanding and acceptance.     Dental advisory given  Plan Discussed with: CRNA  Anesthesia Plan Comments:        Anesthesia Quick Evaluation

## 2022-09-05 NOTE — Telephone Encounter (Signed)
Patient's wife called in requesting a chest x-ray for her husband and appointment with Dr. Reece Agar Patient is losing weight, weak, coughing Saw heart doctor (results in [place])  Would like to see Dr. Reece Agar today    Update: Appointment today at 3:45pm

## 2022-09-06 ENCOUNTER — Telehealth: Payer: Self-pay | Admitting: Family Medicine

## 2022-09-06 DIAGNOSIS — E785 Hyperlipidemia, unspecified: Secondary | ICD-10-CM | POA: Diagnosis not present

## 2022-09-06 DIAGNOSIS — Z8249 Family history of ischemic heart disease and other diseases of the circulatory system: Secondary | ICD-10-CM | POA: Diagnosis not present

## 2022-09-06 DIAGNOSIS — J439 Emphysema, unspecified: Secondary | ICD-10-CM

## 2022-09-06 DIAGNOSIS — E669 Obesity, unspecified: Secondary | ICD-10-CM | POA: Diagnosis not present

## 2022-09-06 DIAGNOSIS — Z7982 Long term (current) use of aspirin: Secondary | ICD-10-CM | POA: Diagnosis not present

## 2022-09-06 DIAGNOSIS — I110 Hypertensive heart disease with heart failure: Secondary | ICD-10-CM | POA: Diagnosis not present

## 2022-09-06 DIAGNOSIS — J189 Pneumonia, unspecified organism: Secondary | ICD-10-CM | POA: Diagnosis present

## 2022-09-06 DIAGNOSIS — J440 Chronic obstructive pulmonary disease with acute lower respiratory infection: Secondary | ICD-10-CM | POA: Diagnosis not present

## 2022-09-06 DIAGNOSIS — Z809 Family history of malignant neoplasm, unspecified: Secondary | ICD-10-CM | POA: Diagnosis not present

## 2022-09-06 DIAGNOSIS — Z803 Family history of malignant neoplasm of breast: Secondary | ICD-10-CM | POA: Diagnosis not present

## 2022-09-06 DIAGNOSIS — Z833 Family history of diabetes mellitus: Secondary | ICD-10-CM | POA: Diagnosis not present

## 2022-09-06 DIAGNOSIS — Z8701 Personal history of pneumonia (recurrent): Secondary | ICD-10-CM | POA: Diagnosis not present

## 2022-09-06 DIAGNOSIS — Z79899 Other long term (current) drug therapy: Secondary | ICD-10-CM | POA: Diagnosis not present

## 2022-09-06 DIAGNOSIS — Z6820 Body mass index (BMI) 20.0-20.9, adult: Secondary | ICD-10-CM | POA: Diagnosis not present

## 2022-09-06 DIAGNOSIS — I5032 Chronic diastolic (congestive) heart failure: Secondary | ICD-10-CM | POA: Diagnosis not present

## 2022-09-06 DIAGNOSIS — K219 Gastro-esophageal reflux disease without esophagitis: Secondary | ICD-10-CM | POA: Diagnosis not present

## 2022-09-06 DIAGNOSIS — R197 Diarrhea, unspecified: Secondary | ICD-10-CM | POA: Diagnosis not present

## 2022-09-06 DIAGNOSIS — Z9889 Other specified postprocedural states: Secondary | ICD-10-CM | POA: Diagnosis not present

## 2022-09-06 DIAGNOSIS — E440 Moderate protein-calorie malnutrition: Secondary | ICD-10-CM | POA: Diagnosis not present

## 2022-09-06 DIAGNOSIS — Z825 Family history of asthma and other chronic lower respiratory diseases: Secondary | ICD-10-CM | POA: Diagnosis not present

## 2022-09-06 DIAGNOSIS — R0602 Shortness of breath: Secondary | ICD-10-CM | POA: Diagnosis present

## 2022-09-06 DIAGNOSIS — Z952 Presence of prosthetic heart valve: Secondary | ICD-10-CM | POA: Diagnosis not present

## 2022-09-06 DIAGNOSIS — Z87891 Personal history of nicotine dependence: Secondary | ICD-10-CM | POA: Diagnosis not present

## 2022-09-06 DIAGNOSIS — M19041 Primary osteoarthritis, right hand: Secondary | ICD-10-CM | POA: Diagnosis not present

## 2022-09-06 DIAGNOSIS — Z6828 Body mass index (BMI) 28.0-28.9, adult: Secondary | ICD-10-CM | POA: Diagnosis not present

## 2022-09-06 DIAGNOSIS — M1811 Unilateral primary osteoarthritis of first carpometacarpal joint, right hand: Secondary | ICD-10-CM | POA: Diagnosis not present

## 2022-09-06 LAB — CBC WITH DIFFERENTIAL/PLATELET
Basophils Absolute: 0.2 10*3/uL — ABNORMAL HIGH (ref 0.0–0.1)
Basophils Relative: 1.6 % (ref 0.0–3.0)
Eosinophils Absolute: 1.2 10*3/uL — ABNORMAL HIGH (ref 0.0–0.7)
Eosinophils Relative: 9.3 % — ABNORMAL HIGH (ref 0.0–5.0)
HCT: 41.2 % (ref 39.0–52.0)
Hemoglobin: 13.5 g/dL (ref 13.0–17.0)
Lymphocytes Relative: 17.1 % (ref 12.0–46.0)
Lymphs Abs: 2.2 10*3/uL (ref 0.7–4.0)
MCHC: 32.9 g/dL (ref 30.0–36.0)
MCV: 93.7 fl (ref 78.0–100.0)
Monocytes Absolute: 1.2 10*3/uL — ABNORMAL HIGH (ref 0.1–1.0)
Monocytes Relative: 9.3 % (ref 3.0–12.0)
Neutro Abs: 8 10*3/uL — ABNORMAL HIGH (ref 1.4–7.7)
Neutrophils Relative %: 62.7 % (ref 43.0–77.0)
Platelets: 404 10*3/uL — ABNORMAL HIGH (ref 150.0–400.0)
RBC: 4.4 Mil/uL (ref 4.22–5.81)
RDW: 13.6 % (ref 11.5–15.5)
WBC: 12.7 10*3/uL — ABNORMAL HIGH (ref 4.0–10.5)

## 2022-09-06 LAB — RESPIRATORY PANEL BY PCR

## 2022-09-06 LAB — COMPREHENSIVE METABOLIC PANEL
ALT: 48 U/L (ref 0–53)
AST: 57 U/L — ABNORMAL HIGH (ref 0–37)
Albumin: 3.3 g/dL — ABNORMAL LOW (ref 3.5–5.2)
Alkaline Phosphatase: 85 U/L (ref 39–117)
BUN: 31 mg/dL — ABNORMAL HIGH (ref 6–23)
CO2: 26 mEq/L (ref 19–32)
Calcium: 9.3 mg/dL (ref 8.4–10.5)
Chloride: 100 mEq/L (ref 96–112)
Creatinine, Ser: 1.3 mg/dL (ref 0.40–1.50)
GFR: 53.76 mL/min — ABNORMAL LOW (ref >60.00)
Glucose, Bld: 99 mg/dL (ref 70–99)
Potassium: 4.6 mEq/L (ref 3.5–5.1)
Sodium: 136 mEq/L (ref 135–145)
Total Bilirubin: 0.5 mg/dL (ref 0.2–1.2)
Total Protein: 7.4 g/dL (ref 6.0–8.3)

## 2022-09-06 LAB — TSH: TSH: 3.72 u[IU]/mL (ref 0.35–5.50)

## 2022-09-06 LAB — EXPECTORATED SPUTUM ASSESSMENT W GRAM STAIN, RFLX TO RESP C

## 2022-09-06 LAB — MRSA NEXT GEN BY PCR, NASAL: MRSA by PCR Next Gen: NOT DETECTED

## 2022-09-06 LAB — STREP PNEUMONIAE URINARY ANTIGEN: Strep Pneumo Urinary Antigen: NEGATIVE

## 2022-09-06 LAB — HIV ANTIBODY (ROUTINE TESTING W REFLEX): HIV Screen 4th Generation wRfx: NONREACTIVE

## 2022-09-06 MED ORDER — ATORVASTATIN CALCIUM 40 MG PO TABS
20.0000 mg | ORAL_TABLET | Freq: Every day | ORAL | Status: DC
  Administered 2022-09-06 – 2022-09-07 (×2): 20 mg via ORAL
  Filled 2022-09-06 (×2): qty 1

## 2022-09-06 MED ORDER — GUAIFENESIN-DM 100-10 MG/5ML PO SYRP
5.0000 mL | ORAL_SOLUTION | ORAL | Status: DC | PRN
  Administered 2022-09-11: 5 mL via ORAL
  Filled 2022-09-06: qty 5

## 2022-09-06 MED ORDER — PANTOPRAZOLE SODIUM 40 MG PO TBEC
40.0000 mg | DELAYED_RELEASE_TABLET | Freq: Every day | ORAL | Status: DC
  Administered 2022-09-09 – 2022-09-10 (×2): 40 mg via ORAL
  Filled 2022-09-06 (×6): qty 1

## 2022-09-06 MED ORDER — FUROSEMIDE 40 MG PO TABS
20.0000 mg | ORAL_TABLET | Freq: Every day | ORAL | Status: DC
  Administered 2022-09-06 – 2022-09-09 (×4): 20 mg via ORAL
  Filled 2022-09-06 (×4): qty 1

## 2022-09-06 MED ORDER — ASPIRIN 81 MG PO TBEC
81.0000 mg | DELAYED_RELEASE_TABLET | Freq: Every day | ORAL | Status: DC
  Administered 2022-09-06 – 2022-09-11 (×6): 81 mg via ORAL
  Filled 2022-09-06 (×6): qty 1

## 2022-09-06 MED ORDER — ENOXAPARIN SODIUM 40 MG/0.4ML IJ SOSY
40.0000 mg | PREFILLED_SYRINGE | INTRAMUSCULAR | Status: DC
  Administered 2022-09-06 – 2022-09-11 (×6): 40 mg via SUBCUTANEOUS
  Filled 2022-09-06 (×6): qty 0.4

## 2022-09-06 MED ORDER — SODIUM CHLORIDE 0.9 % IV SOLN: 2.0000 g | INTRAVENOUS | Status: DC

## 2022-09-06 MED ORDER — PIPERACILLIN-TAZOBACTAM 3.375 G IVPB
3.3750 g | Freq: Three times a day (TID) | INTRAVENOUS | Status: DC
  Administered 2022-09-06 – 2022-09-11 (×15): 3.375 g via INTRAVENOUS
  Filled 2022-09-06 (×15): qty 50

## 2022-09-06 MED ORDER — SODIUM CHLORIDE 0.9 % IV SOLN
500.0000 mg | INTRAVENOUS | Status: DC
  Administered 2022-09-07 (×2): 500 mg via INTRAVENOUS
  Filled 2022-09-06 (×2): qty 5

## 2022-09-06 NOTE — Progress Notes (Signed)
Mobility Specialist - Progress Note   09/06/22 1056  Oxygen Therapy  SpO2 (!) 88 %  O2 Device Room Air  Patient Activity (if Appropriate) Ambulating  Mobility  Activity Ambulated independently in hallway  Level of Assistance Independent  Assistive Device None  Distance Ambulated (ft) 350 ft  Activity Response Tolerated well  Mobility Referral Yes  $Mobility charge 1 Mobility  Mobility Specialist Start Time (ACUTE ONLY) 1039  Mobility Specialist Stop Time (ACUTE ONLY) 1054  Mobility Specialist Time Calculation (min) (ACUTE ONLY) 15 min   Pt received in bed and agreeable to mobility. Pt desat to 88% during ambulation. Encouraged a standing break with pursed lip breaths allowing O2 to come back up to 92%. Pt had several coughing bouts throughout session. No complaints during session. Pt to EOB after session with all needs met.    Pre-mobility: 100 HR, 92% SpO2 (RA) During mobility:  88% SpO2 (RA) Post-mobility: 104 HR, 92% SPO2 (RA)  Chief Technology Officer

## 2022-09-06 NOTE — Progress Notes (Signed)
Mobility Specialist - Progress Note   09/06/22 1424  Oxygen Therapy  SpO2 (!) 87 %  O2 Device Room Air  Patient Activity (if Appropriate) Ambulating  Mobility  Activity Ambulated independently in hallway  Level of Assistance Independent  Assistive Device None  Distance Ambulated (ft) 350 ft  Activity Response Tolerated well  Mobility Referral Yes  $Mobility charge 1 Mobility  Mobility Specialist Start Time (ACUTE ONLY) 0202  Mobility Specialist Stop Time (ACUTE ONLY) 0224  Mobility Specialist Time Calculation (min) (ACUTE ONLY) 22 min   Pt received in bed and agreeable to mobility. Pt desat to 87%. Encouraged a standing rest break with pursed lip breathing allowing O2 to come back up to 90%. No complaints during session. Pt to bed after session with all needs met.    [facility]

## 2022-09-06 NOTE — TOC CM/SW Note (Signed)
Transition of Care [facility]) - Inpatient Brief Assessment   Patient Details  Name: INOCENTE KRACH MRN: [id number] Date of Birth: Jul 04, 1945  [facility] CM/SW Contact:    Otelia Santee, LCSW Phone Number: 09/06/2022, 10:06 AM   Clinical Narrative: Chart reviewed. No TOC needs identified.    Transition of Care Asessment: Insurance and Status: Insurance coverage has been reviewed Patient has primary care physician: Yes Home environment has been reviewed: Home w/ spouse Prior level of function:: Independent Prior/Current Home Services: No current home services Social Determinants of Health Reivew: SDOH reviewed no interventions necessary Readmission risk has been reviewed: Yes Transition of care needs: no transition of care needs at this time

## 2022-09-06 NOTE — Consult Note (Addendum)
NAME:  Ryan Mathews, MRN:  [id number], DOB:  [birth year], LOS: 0 ADMISSION DATE:  09/05/2022, CONSULTATION DATE:  09/06/22  REFERRING MD:  Dr Elvera Lennox, MD  CHIEF COMPLAINT:  SOB   History of Present Illness:  Ryan Mathews. Roper is a pleasant 77 yr old male with PMH notable for COPD with emphysema, prior tobacco use (quit 15 years ago), MV repair in Feb 2023 with subsequent moderate MR, chronic diastolic CHF who presents to hospital with persistent PNA symptoms for the past month. Pt states he saw his PCP at end of May with respiratory symptoms, was diagnosed with pneumonia and placed on a 10 day course of Augmentin along with azithromycin. Pt states he took the abx as prescribed however despite treatment his symptoms persisted. Pt states he has been having low grade temps as well as nighttime sweats since that time, accompanied by coughing fits which are occasionally productive. Pt states this productive cough is mostly "dry phlegm unsure of color". Pt also goes on to state he has experienced poor P.O. intake as well as a recent 20lb weight loss over the same interval.   PCCM was consulted for assistance in management of persistent pneumonia, SOB, coughing spells as well as possible bronchoscopy need.   Pertinent  Medical History  COPD with emphysema, prior tobacco use (quit 15 years ago), MV repair in Feb 2023 with subsequent moderate MR, chronic diastolic CHF  Significant Hospital Events: Including procedures, antibiotic start and stop dates in addition to other pertinent events   09/06/2022- consult placed to PCCM, Bcx2 pending, strep (-) urine legionella pending. Plan for bronchoscopy tomorrow 6/26 if pt cannot produce sputum sample on his own  Interim History / Subjective:  Pt denies any current SOB, CP, ABD pain or any other acute complications. Pt states when he ambulates however his SOB increases. Denies any current productive cough and has been unable to produce sputum for sputum  culture.   Objective   Blood pressure 121/69, pulse 87, temperature 98.2 F (36.8 C), resp. rate 16, height 5\' 9"  (1.753 m), weight 86.2 kg, SpO2 94 %.        Intake/Output Summary (Last 24 hours) at 09/06/2022 1033 Last data filed at 09/06/2022 0538 Gross per 24 hour  Intake 340 ml  Output 200 ml  Net 140 ml   Filed Weights   09/05/22 1809  Weight: 86.2 kg    Examination:   Physical Exam Constitutional:      Appearance: He is obese.  HENT:     Head: Normocephalic.     Mouth/Throat:     Mouth: Mucous membranes are moist.     Pharynx: Oropharynx is clear.  Eyes:     Extraocular Movements: Extraocular movements intact.     Pupils: Pupils are equal, round, and reactive to light.  Cardiovascular:     Rate and Rhythm: Normal rate and regular rhythm.  Pulmonary:     Effort: Pulmonary effort is normal.     Breath sounds: Normal breath sounds.  Abdominal:     Palpations: Abdomen is soft.  Musculoskeletal:        General: Normal range of motion.     Cervical back: Normal range of motion.  Skin:    General: Skin is warm.     Capillary Refill: Capillary refill takes less than 2 seconds.  Neurological:     General: No focal deficit present.     Mental Status: He is alert and oriented to person, place, and time.  Psychiatric:        Mood and Affect: Mood normal.        Behavior: Behavior normal.     Resolved Hospital Problem list   N/A  Assessment & Plan:   Community Acquired Pneumonia Leukocytosis secondary to above Pt has had persistent PNA symptoms over the last month. Imaging supports this. Pt has had intermittent fevers at home with persistent cough, poor P.O intake with recent 20lb weight loss. Lung sounds CTA BL on room air, able to speak in long and complete sentences without apparent complications. Remains afebrile since arrival to hospital, leukocytosis notably at 14.0, will continue to monitor -Transition IV ABX therapy to Azithromycin and Zosyn for  anaerobic coverage -Urine strep (-), legionella pending -Recent RSV and COVID (-) when S/S began -RVP pending -Sputum cultures if pt can provider sample -BC x2 pending, continue to follow and titrate abx therapy per sensitivites -Scheduled Bronchoscopy tomorrow -Robitussin PRN   COPD, Emphysema Worse in upper lobes- PMH of tobacco abuse however has quit smoking over one decade ago.    Best Practice (right click and "Reselect all SmartList Selections" daily)   Diet/type: Regular consistency (see orders) DVT prophylaxis: other lovenox GI prophylaxis: N/A Lines: N/A Foley:  N/A Code Status:  full code Last date of multidisciplinary goals of care discussion [09/06/22 ]  Labs   CBC: Recent Labs  Lab 08/31/22 1201 09/05/22 1818  WBC  --  14.0*  HGB 14.3 13.6  HCT 42.0 41.4  MCV  --  93.5  PLT  --  384    Basic Metabolic Panel: Recent Labs  Lab 08/31/22 1201 09/05/22 1818  NA 138 134*  K 4.5 4.3  CL 104 101  CO2  --  22  GLUCOSE 96 101*  BUN 24* 31*  CREATININE 1.10 1.24  CALCIUM  --  9.5   GFR: Estimated Creatinine Clearance: 56 mL/min (by C-G formula based on SCr of 1.24 mg/dL). Recent Labs  Lab 09/05/22 1818  WBC 14.0*    Liver Function Tests: No results for input(s): "AST", "ALT", "ALKPHOS", "BILITOT", "PROT", "ALBUMIN" in the last 168 hours. No results for input(s): "LIPASE", "AMYLASE" in the last 168 hours. No results for input(s): "AMMONIA" in the last 168 hours.  ABG    Component Value Date/Time   PHART 7.330 (L) 02/16/2021 1147   PCO2ART 41.5 02/16/2021 1147   PO2ART 183 (H) 02/16/2021 1147   HCO3 21.9 02/16/2021 1147   TCO2 26 08/31/2022 1201   ACIDBASEDEF 4.0 (H) 02/16/2021 1147   O2SAT 100.0 02/16/2021 1147     Coagulation Profile: No results for input(s): "INR", "PROTIME" in the last 168 hours.  Cardiac Enzymes: No results for input(s): "CKTOTAL", "CKMB", "CKMBINDEX", "TROPONINI" in the last 168 hours.  HbA1C: No results found  for: "HGBA1C"  CBG: No results for input(s): "GLUCAP" in the last 168 hours.  Review of Systems:     Past Medical History:  He,  has a past medical history of Bilateral inguinal hernia (BIH) s/p lap repair 09/06/2012 (08/09/2012), Cataract, CMC arthritis, thumb, degenerative, COPD (chronic obstructive pulmonary disease) (HCC), Emphysema of lung (HCC), Ex-smoker, GERD (gastroesophageal reflux disease), History of chicken pox, History of pneumonia (08/06/2009), Hyperlipidemia, Hypertension, Nasal septal deviation (04/14/2017), Numbness, Obesity (BMI 30-39.9) (08/09/2012), and S/P MVR (mitral valve repair) 36 mm Simulus semi rigid band) via Heartport 04/29/2021 (04/29/2021).   Surgical History:   Past Surgical History:  Procedure Laterality Date   ANKLE SURGERY Left 1966   with tendon repair with  stainless steel wire   CARDIAC CATHETERIZATION     COLONOSCOPY  07/2017   multiple tubular adenomas, rpt 3 yrs (Pyrtle)   INSERTION OF MESH Bilateral 09/06/2012   Procedure: INSERTION OF MESH;  Surgeon: Ardeth Sportsman, MD   LAPAROSCOPIC INGUINAL HERNIA WITH UMBILICAL HERNIA Bilateral 09/06/2012   Procedure: LAPAROSCOPIC exploration and repair of hernias in bellybutton and bilateral groins with mesh;  Surgeon: Ardeth Sportsman, MD   MICRODISCECTOMY LUMBAR Right 08/2020   L4/5 (Dr Maurice Small)   MITRAL VALVE REPAIR  04/29/2021   at [facility] Dr Zebedee Iba   RIGHT/LEFT HEART CATH AND CORONARY ANGIOGRAPHY N/A 02/16/2021   Procedure: RIGHT/LEFT HEART CATH AND CORONARY ANGIOGRAPHY;  Surgeon: Yates Decamp, MD;  Location: [facility] INVASIVE CV LAB;  Service: Cardiovascular;  Laterality: N/A;   TEE WITHOUT CARDIOVERSION N/A 02/12/2021   Procedure: TRANSESOPHAGEAL ECHOCARDIOGRAM (TEE);  Surgeon: Yates Decamp, MD;  Location: [facility] ENDOSCOPY;  Service: Cardiovascular;  Laterality: N/A;   TEE WITHOUT CARDIOVERSION N/A 08/31/2022   Procedure: TRANSESOPHAGEAL ECHOCARDIOGRAM;  Surgeon: Tessa Lerner, DO;  Location: [facility] INVASIVE CV LAB;   Service: Cardiovascular;  Laterality: N/A;     Social History:   reports that he quit smoking about 16 years ago. His smoking use included cigarettes. He started smoking about 54 years ago. He has a 80.00 pack-year smoking history. He has never used smokeless tobacco. He reports current alcohol use. He reports that he does not use drugs.   Family History:  His family history includes COPD in his father; Cancer in his mother and sister; Congenital heart disease in his mother; Diabetes in his mother. There is no history of Colon cancer, Esophageal cancer, Liver cancer, Rectal cancer, Stomach cancer, or Pancreatic cancer.   Allergies No Known Allergies   Home Medications  Prior to Admission medications   Medication Sig Start Date End Date Taking? Authorizing Provider  acetaminophen (TYLENOL) 650 MG CR tablet Take 650 mg by mouth 2 (two) times daily.   Yes [provider]  Ascorbic Acid 500 MG CHEW Chew 1 tablet by mouth daily.   Yes [provider]  aspirin EC 81 MG tablet Take 81 mg by mouth daily. Swallow whole.   Yes [provider]  atorvastatin (LIPITOR) 20 MG tablet TAKE 1 TABLET BY MOUTH EVERY DAY 08/02/22  Yes Custovic, Sabina, DO  b complex vitamins tablet Take 1 tablet by mouth daily.   Yes [provider]  furosemide (LASIX) 20 MG tablet Take 1 tablet (20 mg total) by mouth daily. 08/29/22 11/27/22 Yes Yates Decamp, MD  lisinopril (ZESTRIL) 10 MG tablet Take 1 tablet (10 mg total) by mouth at bedtime. 05/26/22 09/06/22 Yes Custovic, Rozell Searing, DO  Omega-3 Fatty Acids (FISH OIL) 1000 MG CAPS Take 1 capsule (1,000 mg total) by mouth daily. 05/12/20  Yes [name], MD  Omeprazole-Sodium Bicarbonate (ZEGERID OTC) 20-1100 MG CAPS capsule Take 1 capsule by mouth daily before breakfast. Patient taking differently: Take 1 capsule by mouth as needed (Acid reflux). 05/04/18  Yes [name], MD  potassium chloride SA (KLOR-CON M20) 20 MEQ tablet Take 1  tablet (20 mEq total) by mouth daily. 08/29/22 11/27/22 Yes Yates Decamp, MD     Critical care time: 65 minutes    Janeece Riggers, AGACNP-BC [facility] For pager details, please see AMION or use EPIC chat After 1900, please call [phone] [facility] for cross coverage needs 09/06/2022 10:33 AM

## 2022-09-06 NOTE — Assessment & Plan Note (Addendum)
From prior smoking. Worst in upper lobes.

## 2022-09-06 NOTE — Progress Notes (Signed)
Pharmacy Antibiotic Note  Ryan Mathews is a 77 y.o. male admitted on 09/05/2022 with  aspiration PNA  .  Pharmacy has been consulted for Zosyn dosing. Pt previusly on ceftriaxone but being broadened to Zosyn per pulm recs   Plan: Zosyn 3.375g IV q8h (4 hour infusion).  Height: 5\' 9"  (175.3 cm) Weight: 86.2 kg (190 lb 0.6 oz) IBW/kg (Calculated) : 70.7  Temp (24hrs), Avg:98.2 F (36.8 C), Min:97.4 F (36.3 C), Max:98.5 F (36.9 C)  Recent Labs  Lab 08/31/22 1201 09/05/22 1818  WBC  --  14.0*  CREATININE 1.10 1.24    Estimated Creatinine Clearance: 56 mL/min (by C-G formula based on SCr of 1.24 mg/dL).    No Known Allergies   Dosage will likely remain stable at above dosage and need for further dosage adjustment appears unlikely at present.    Will sign off at this time.  Please reconsult if a change in clinical status warrants re-evaluation of dosage.    Thank you for allowing pharmacy to be a part of this patient's care.  Adalberto Cole, PharmD, BCPS 09/06/2022 10:35 AM

## 2022-09-06 NOTE — H&P (Signed)
History and Physical    Patient: Ryan Mathews MWU:[id number] DOB: Aug 16, 1945 DOA: 09/05/2022 DOS: the patient was seen and examined on 09/06/2022 PCP: [name], MD  Patient coming from: Home  Chief Complaint:  Chief Complaint  Patient presents with   Shortness of Breath   HPI: Ryan Mathews is a 77 y.o. male with medical history significant of COPD with emphysema, mitral valve repair in Feb 2023 with subsequent moderate MR, EF preserved grade 2 DD.  Pt with PNA symptoms for about the past month.  Treated with amoxicillin and azithromycin as outpt.  Cough, pleuritic back pain onset about a month ago.  Cards just saw him a few days ago due to concerns about vegetation on valve performed TEE on 6/19; however, this was negative for vegetations on any of his 4 valves (just showed moderate MR again with no vegetation).  Has continued to have dry cough, pleuritic CP, night sweats, no documented fever.  Saw PCP today who noted that CXR still worrisome for LUL PNA / multifocal PNA, and pt sent in to ED at [facility].   Review of Systems: As mentioned in the history of present illness. All other systems reviewed and are negative. Past Medical History:  Diagnosis Date   Bilateral inguinal hernia (BIH) s/p lap repair 09/06/2012 08/09/2012   Cataract    CMC arthritis, thumb, degenerative    right   COPD (chronic obstructive pulmonary disease) (HCC)    Emphysema of lung (HCC)    Ex-smoker    GERD (gastroesophageal reflux disease)    History of chicken pox    History of pneumonia 08/06/2009        Hyperlipidemia    Hypertension    Nasal septal deviation 04/14/2017   Marked   Numbness    R cheek stays numb   Obesity (BMI 30-39.9) 08/09/2012   S/P MVR (mitral valve repair) 36 mm Simulus semi rigid band) via Heartport 04/29/2021 04/29/2021   S/p minimally invasive mitral valve repair (semirigid 36mm Simulus annuloplasty band) at [facility] (Dr Zebedee Iba) 04/29/2021. Followed locally by Dr  Jacinto Halim. Has been recommended dental ppx.   Echo 05/2022 - normal LVEF 55-60%, mod concentric LVH, G2DD, normal wall motion, mild-mod dilated LA, mod MR with MV annular ring repair with wall impinging MR jet but improved, mild TR (Dr Melton Alar)   Past Surgical History:  Procedure Laterality Date   ANKLE SURGERY Left 1966   with tendon repair with stainless steel wire   CARDIAC CATHETERIZATION     COLONOSCOPY  07/2017   multiple tubular adenomas, rpt 3 yrs (Pyrtle)   INSERTION OF MESH Bilateral 09/06/2012   Procedure: INSERTION OF MESH;  Surgeon: Ardeth Sportsman, MD   LAPAROSCOPIC INGUINAL HERNIA WITH UMBILICAL HERNIA Bilateral 09/06/2012   Procedure: LAPAROSCOPIC exploration and repair of hernias in bellybutton and bilateral groins with mesh;  Surgeon: Ardeth Sportsman, MD   MICRODISCECTOMY LUMBAR Right 08/2020   L4/5 (Dr Maurice Small)   MITRAL VALVE REPAIR  04/29/2021   at [facility] Dr Zebedee Iba   RIGHT/LEFT HEART CATH AND CORONARY ANGIOGRAPHY N/A 02/16/2021   Procedure: RIGHT/LEFT HEART CATH AND CORONARY ANGIOGRAPHY;  Surgeon: Yates Decamp, MD;  Location: [facility] INVASIVE CV LAB;  Service: Cardiovascular;  Laterality: N/A;   TEE WITHOUT CARDIOVERSION N/A 02/12/2021   Procedure: TRANSESOPHAGEAL ECHOCARDIOGRAM (TEE);  Surgeon: Yates Decamp, MD;  Location[facility] ENDOSCOPY;  Service: Cardiovascular;  Laterality: N/A;   TEE WITHOUT CARDIOVERSION N/A 08/31/2022   Procedure: TRANSESOPHAGEAL ECHOCARDIOGRAM;  Surgeon: Tessa Lerner, DO;  Location: [facility] INVASIVE CV LAB;  Service: Cardiovascular;  Laterality: N/A;   Social History:  reports that he quit smoking about 16 years ago. His smoking use included cigarettes. He started smoking about 54 years ago. He has a 80.00 pack-year smoking history. He has never used smokeless tobacco. He reports current alcohol use. He reports that he does not use drugs.  No Known Allergies  Family History  Problem Relation Age of Onset   Cancer Mother        breast   Congenital heart disease  Mother    Diabetes Mother    COPD Father    Cancer Sister        lung (minimal smoker)   Colon cancer Neg Hx    Esophageal cancer Neg Hx    Liver cancer Neg Hx    Rectal cancer Neg Hx    Stomach cancer Neg Hx    Pancreatic cancer Neg Hx     Prior to Admission medications   Medication Sig Start Date End Date Taking? Authorizing Provider  Ascorbic Acid 500 MG CHEW Chew 1 tablet by mouth daily.    [provider]  aspirin EC 81 MG tablet Take 81 mg by mouth daily. Swallow whole.    [provider]  atorvastatin (LIPITOR) 20 MG tablet TAKE 1 TABLET BY MOUTH EVERY DAY 08/02/22   Custovic, Rozell Searing, DO  b complex vitamins tablet Take 1 tablet by mouth daily.    [provider]  furosemide (LASIX) 20 MG tablet Take 1 tablet (20 mg total) by mouth daily. 08/29/22 11/27/22  Yates Decamp, MD  lisinopril (ZESTRIL) 10 MG tablet Take 1 tablet (10 mg total) by mouth at bedtime. 05/26/22 09/05/22  Custovic, Rozell Searing, DO  Omega-3 Fatty Acids (FISH OIL) 1000 MG CAPS Take 1 capsule (1,000 mg total) by mouth daily. 05/12/20   [name], MD  Omeprazole-Sodium Bicarbonate (ZEGERID OTC) 20-1100 MG CAPS capsule Take 1 capsule by mouth daily before breakfast. Patient taking differently: Take 1 capsule by mouth as needed. 05/04/18   [name], MD  potassium chloride SA (KLOR-CON M20) 20 MEQ tablet Take 1 tablet (20 mEq total) by mouth daily. 08/29/22 11/27/22  Yates Decamp, MD    Physical Exam: Vitals:   09/05/22 2315 09/06/22 0121 09/06/22 0122 09/06/22 0221  BP: 125/72 113/62  128/61  Pulse: 86 94  84  Resp: (!) 22 (!) 32  16  Temp:   98.5 F (36.9 C) 98.4 F (36.9 C)  TempSrc:   Oral Oral  SpO2: 96% 93%  96%  Weight:      Height:       Constitutional: NAD, calm, comfortable Respiratory: Rhonchi present, no respiratory distress nor accessory muscle use Cardiovascular: Regular rate and rhythm, no murmurs / rubs / gallops. No extremity edema. 2+ pedal pulses. No carotid  bruits.  Abdomen: no tenderness, no masses palpated. No hepatosplenomegaly. Bowel sounds positive.  Neurologic: CN 2-12 grossly intact. Sensation intact, DTR normal. Strength 5/5 in all 4.  Psychiatric: Normal judgment and insight. Alert and oriented x 3. Normal mood.   Data Reviewed:    Labs on Admission: I have personally reviewed following labs and imaging studies  CBC: Recent Labs  Lab 08/31/22 1201 09/05/22 1818  WBC  --  14.0*  HGB 14.3 13.6  HCT 42.0 41.4  MCV  --  93.5  PLT  --  384   Basic Metabolic Panel: Recent Labs  Lab 08/31/22 1201 09/05/22 1818  NA 138 134*  K 4.5 4.3  CL 104 101  CO2  --  22  GLUCOSE 96 101*  BUN 24* 31*  CREATININE 1.10 1.24  CALCIUM  --  9.5   GFR: Estimated Creatinine Clearance: 56 mL/min (by C-G formula based on SCr of 1.24 mg/dL). Liver Function Tests: No results for input(s): "AST", "ALT", "ALKPHOS", "BILITOT", "PROT", "ALBUMIN" in the last 168 hours. No results for input(s): "LIPASE", "AMYLASE" in the last 168 hours. No results for input(s): "AMMONIA" in the last 168 hours. Coagulation Profile: No results for input(s): "INR", "PROTIME" in the last 168 hours. Cardiac Enzymes: No results for input(s): "CKTOTAL", "CKMB", "CKMBINDEX", "TROPONINI" in the last 168 hours. BNP (last 3 results) Recent Labs    08/30/22 1355  PROBNP 301   HbA1C: No results for input(s): "HGBA1C" in the last 72 hours. CBG: No results for input(s): "GLUCAP" in the last 168 hours. Lipid Profile: No results for input(s): "CHOL", "HDL", "LDLCALC", "TRIG", "CHOLHDL", "LDLDIRECT" in the last 72 hours. Thyroid Function Tests: No results for input(s): "TSH", "T4TOTAL", "FREET4", "T3FREE", "THYROIDAB" in the last 72 hours. Anemia Panel: No results for input(s): "VITAMINB12", "FOLATE", "FERRITIN", "TIBC", "IRON", "RETICCTPCT" in the last 72 hours. Urine analysis: No results found for: "COLORURINE", "APPEARANCEUR", "LABSPEC", "PHURINE", "GLUCOSEU",  "HGBUR", "BILIRUBINUR", "KETONESUR", "PROTEINUR", "UROBILINOGEN", "NITRITE", "LEUKOCYTESUR"  Radiological Exams on Admission: CT Chest W Contrast  Result Date: 09/05/2022 CLINICAL DATA:  Pneumonia, complication suspected. EXAM: CT CHEST WITH CONTRAST TECHNIQUE: Multidetector CT imaging of the chest was performed during intravenous contrast administration. RADIATION DOSE REDUCTION: This exam was performed according to the departmental dose-optimization program which includes automated exposure control, adjustment of the mA and/or kV according to patient size and/or use of iterative reconstruction technique. CONTRAST:  75mL OMNIPAQUE IOHEXOL 300 MG/ML  SOLN COMPARISON:  Chest radiograph performed earlier on the same date. FINDINGS: Cardiovascular: No significant vascular findings. Normal heart size. Prosthetic mitral valve with streak artifact. No pericardial effusion. Mediastinum/Nodes: Multiple subcentimeter paratracheal vascular sclerosis and subcarinal lymph nodes, likely reactive. No bulky lymphadenopathy. Lungs/Pleura: Severe emphysematous changes of bilateral upper lobes. Large consolidation in the left upper lobe with air bronchograms and small dependent left lung consolidation. There is also small consolidation in the dependent portion of the left upper lobe. These are likely secondary to multifocal pneumonia. No large pleural effusion or pneumothorax. Upper Abdomen: Small hiatal hernia.  No acute abdominal process. Musculoskeletal: No chest wall abnormality. No acute or significant osseous findings. IMPRESSION: 1. Large consolidation in the left upper lobe with air bronchograms and small dependent left lung consolidation. There is also small consolidation in the dependent portion of the left upper lobe. These are likely secondary to multifocal pneumonia. Follow-up examination to resolution is recommended. 2. Severe emphysematous changes of bilateral upper lobes. 3. Multiple subcentimeter mediastinal  lymph nodes, likely reactive. 4. No acute vascular abnormality. Emphysema (ICD10-J43.9). Electronically Signed   By: Larose Hires D.O.   On: 09/05/2022 23:56   DG Chest Port 1 View  Result Date: 09/05/2022 CLINICAL DATA:  Cough. EXAM: PORTABLE CHEST 1 VIEW COMPARISON:  Chest radiograph dated 09/05/2022. FINDINGS: No significant interval change in the bilateral pulmonary opacities compared to the earlier radiograph. No pleural effusion or pneumothorax. The cardiac silhouette is within limits. No acute osseous pathology. IMPRESSION: No significant interval change.  Continued follow-up recommended. Electronically Signed   By: Elgie Collard M.D.   On: 09/05/2022 22:09   DG Chest 2 View  Result Date: 09/05/2022 CLINICAL DATA:  Chronic cough, chills, night sweats, 20 pound  weight loss. EXAM: CHEST - 2 VIEW COMPARISON:  Chest x-ray dated Aug 09, 2022. FINDINGS: Normal heart size. Prior mitral valve repair. Worsening patchy consolidation in the left upper lobe with new areas of pleural thickening and retraction of the left hilum. New mild patchy opacity in the superior segment of the right lower lobe in the basilar left lower lobe. No pleural effusion or pneumothorax. No acute osseous abnormality. IMPRESSION: 1. Progressive multifocal pneumonia, significantly worsened in the left upper lobe pneumonia with new areas of pleural thickening and retraction of the left hilum. Atypical infections should be considered. CT chest with contrast may be helpful for further characterization. Electronically Signed   By: Obie Dredge M.D.   On: 09/05/2022 16:45    EKG: Independently reviewed.   Assessment and Plan: * CAP (community acquired pneumonia) LUL CAP with symptoms ongoing for past month, failed outpt Augmentin + azithro. PNA pathway Rocephin + azithro Urine for legionella and s.pneumo RSV and COVID neg last month when tested DDx includes neoplasm with his smoking history, and possibly atypical / unusual  organisms.  If still not improving despite IV ABx here in hospital, consult pulm to see if they need to bronch him.  S/P MVR (mitral valve repair) 36 mm Simulus semi rigid band) via Heartport 04/29/2021 With Mod MR of repaired valve. No vegetation on this or other 3 valves per TEE 6 days ago.  Bullous emphysema (HCC) From prior smoking. Worst in upper lobes.      Advance Care Planning:   Code Status: Full Code  Consults: None  Family Communication: No family in room  Severity of Illness: The appropriate patient status for this patient is OBSERVATION. Observation status is judged to be reasonable and necessary in order to provide the required intensity of service to ensure the patient's safety. The patient's presenting symptoms, physical exam findings, and initial radiographic and laboratory data in the context of their medical condition is felt to place them at decreased risk for further clinical deterioration. Furthermore, it is anticipated that the patient will be medically stable for discharge from the hospital within 2 midnights of admission.   Author: Hillary Bow., DO 09/06/2022 3:54 AM  For on call review [email or web address].

## 2022-09-06 NOTE — Telephone Encounter (Signed)
Patient returned call to the office, I could not find any documentation of  a telephone call. Wanted to leave this message on here just in case someone was trying to reach this patient. States he can be reached at his mobile number if needed.

## 2022-09-06 NOTE — Progress Notes (Addendum)
PROGRESS NOTE  Ryan Mathews AYT:[id number] DOB: April 06, 1945 DOA: 09/05/2022 PCP: [name], MD   LOS: 0 days   No charge note, patient admitted overnight  Brief Narrative / Interim history: 77 year old male with history of COPD with emphysema, prior tobacco use quit 15 years ago, mitral valve repair in February 2023 with subsequent moderate MR, chronic diastolic CHF comes into the hospital with persistent pneumonia symptoms for the past month.  He saw his PCP at the end of May with respiratory symptoms, was diagnosed with pneumonia and placed on a 10-day course of Augmentin along with azithromycin.  He took the antibiotics as prescribed, but despite that his symptoms persisted.  He has been having low-grade temps and nighttime sweats since then, along with intermittent cough which occasionally is productive.  He is also experienced poor p.o. intake as well as a 20 pound weight loss in the same interval.  He comes back to the hospital with persistent symptoms  Subjective / 24h Interval events: Seems a bit frustrated about persistent symptoms, complains of a cough this morning and tells me he gets these "spells" when he coughs incessantly.  Assesement and Plan: Principal Problem:   CAP (community acquired pneumonia) Active Problems:   Bullous emphysema (HCC)   S/P MVR (mitral valve repair) 36 mm Simulus semi rigid band) via Heartport 04/29/2021  Principal problem Multifocal pneumonia -seems persistent over the last month based on imaging as well as symptomatology.  Has been having intermittent fevers at home, persistent cough, poor p.o. intake with a 20 pound weight loss.  Given constellation of symptoms, pulmonary was consulted to evaluate for bronchoscopy, discussed with Dr. Francine Graven, appreciate input. -For now change ceftriaxone to Zosyn to have better anaerobic coverage at pulm recommendation. -When his initial symptoms started, he was negative for RSV and COVID -Strep pneumo  antigen is negative, Legionella pending -Obtain sputum cultures if he can give [place] a sample -Continue to monitor blood cultures  Active problems COPD, emphysema -worse in the upper lobes, from prior smoking.  Status post MVR 36 mm Simulus semi rigid band) via Heartport 04/29/2021  -follows closely with cardiology as an outpatient, they were concerned about vegetations on the valve however he underwent a TEE on 6/19 which was negative for vegetation/infective endocarditis  Chronic diastolic CHF -resume home Lasix  Essential hypertension -blood pressure actually normal, resume home Lasix as above, hold lisinopril  Hyperlipidemia -resume home statin  Scheduled Meds:  enoxaparin (LOVENOX) injection  40 mg Subcutaneous Q24H   Continuous Infusions:  azithromycin     cefTRIAXone (ROCEPHIN)  IV     PRN Meds:.guaiFENesin-dextromethorphan  Current Outpatient Medications  Medication Instructions   acetaminophen (TYLENOL) 650 mg, Oral, 2 times daily   Ascorbic Acid 500 MG CHEW 1 tablet, Oral, Daily   aspirin EC 81 mg, Oral, Daily, Swallow whole.   atorvastatin (LIPITOR) 20 mg, Oral, Daily   b complex vitamins tablet 1 tablet, Oral, Daily   Fish Oil 1,000 mg, Oral, Daily   furosemide (LASIX) 20 mg, Oral, Daily   lisinopril (ZESTRIL) 10 mg, Oral, Daily at bedtime   Omeprazole-Sodium Bicarbonate (ZEGERID OTC) 20-1100 MG CAPS capsule 1 capsule, Oral, Daily before breakfast   potassium chloride SA (KLOR-CON M20) 20 MEQ tablet 20 mEq, Oral, Daily    Diet Orders (From admission, onward)     Start     Ordered   09/06/22 0344  Diet Heart Room service appropriate? Yes; Fluid consistency: Thin  Diet effective now  Question Answer Comment  Room service appropriate? Yes   Fluid consistency: Thin      09/06/22 0344            DVT prophylaxis: enoxaparin (LOVENOX) injection 40 mg Start: 09/06/22 1000   Lab Results  Component Value Date   PLT 384 09/05/2022      Code Status:  Full Code  Family Communication: wife at bedside   Status is: Observation The patient will require care spanning > 2 midnights and should be moved to inpatient because: Pulmonary consult, probable bronchoscopy, needs IV antibiotics   Level of care: Med-Surg  Consultants:  Pulmonary   Objective: Vitals:   09/06/22 0121 09/06/22 0122 09/06/22 0221 09/06/22 0540  BP: 113/62  128/61 121/69  Pulse: 94  84 87  Resp: (!) 32  16 16  Temp:  98.5 F (36.9 C) 98.4 F (36.9 C) 98.2 F (36.8 C)  TempSrc:  Oral Oral   SpO2: 93%  96% 94%  Weight:      Height:        Intake/Output Summary (Last 24 hours) at 09/06/2022 0953 Last data filed at 09/06/2022 0538 Gross per 24 hour  Intake 340 ml  Output 200 ml  Net 140 ml   Wt Readings from Last 3 Encounters:  09/05/22 86.2 kg  09/05/22 86.2 kg  08/31/22 86.2 kg    Examination:  Constitutional: NAD Eyes: no scleral icterus ENMT: Mucous membranes are moist.  Neck: normal, supple Respiratory: Overall diminished breath sounds, no wheezing heard Cardiovascular: Regular rate and rhythm, no murmurs / rubs / gallops.  Abdomen: non distended, no tenderness. Bowel sounds positive.  Musculoskeletal: no clubbing / cyanosis.   Data Reviewed: I have independently reviewed following labs and imaging studies   CBC Recent Labs  Lab 08/31/22 1201 09/05/22 1818  WBC  --  14.0*  HGB 14.3 13.6  HCT 42.0 41.4  PLT  --  384  MCV  --  93.5  MCH  --  30.7  MCHC  --  32.9  RDW  --  13.4    Recent Labs  Lab 08/31/22 1201 09/05/22 1818  NA 138 134*  K 4.5 4.3  CL 104 101  CO2  --  22  GLUCOSE 96 101*  BUN 24* 31*  CREATININE 1.10 1.24  CALCIUM  --  9.5    ------------------------------------------------------------------------------------------------------------------ No results for input(s): "CHOL", "HDL", "LDLCALC", "TRIG", "CHOLHDL", "LDLDIRECT" in the last 72 hours.  No results found for:  "HGBA1C" ------------------------------------------------------------------------------------------------------------------ No results for input(s): "TSH", "T4TOTAL", "T3FREE", "THYROIDAB" in the last 72 hours.  Invalid input(s): "FREET3"  Cardiac Enzymes No results for input(s): "CKMB", "TROPONINI", "MYOGLOBIN" in the last 168 hours.  Invalid input(s): "CK" ------------------------------------------------------------------------------------------------------------------    Component Value Date/Time   BNP 33.7 03/15/2021 2158    CBG: No results for input(s): "GLUCAP" in the last 168 hours.  No results found for this or any previous visit (from the past 240 hour(s)).   Radiology Studies: CT Chest W Contrast  Result Date: 09/05/2022 CLINICAL DATA:  Pneumonia, complication suspected. EXAM: CT CHEST WITH CONTRAST TECHNIQUE: Multidetector CT imaging of the chest was performed during intravenous contrast administration. RADIATION DOSE REDUCTION: This exam was performed according to the departmental dose-optimization program which includes automated exposure control, adjustment of the mA and/or kV according to patient size and/or use of iterative reconstruction technique. CONTRAST:  75mL OMNIPAQUE IOHEXOL 300 MG/ML  SOLN COMPARISON:  Chest radiograph performed earlier on the same date. FINDINGS: Cardiovascular: No significant  vascular findings. Normal heart size. Prosthetic mitral valve with streak artifact. No pericardial effusion. Mediastinum/Nodes: Multiple subcentimeter paratracheal vascular sclerosis and subcarinal lymph nodes, likely reactive. No bulky lymphadenopathy. Lungs/Pleura: Severe emphysematous changes of bilateral upper lobes. Large consolidation in the left upper lobe with air bronchograms and small dependent left lung consolidation. There is also small consolidation in the dependent portion of the left upper lobe. These are likely secondary to multifocal pneumonia. No large pleural  effusion or pneumothorax. Upper Abdomen: Small hiatal hernia.  No acute abdominal process. Musculoskeletal: No chest wall abnormality. No acute or significant osseous findings. IMPRESSION: 1. Large consolidation in the left upper lobe with air bronchograms and small dependent left lung consolidation. There is also small consolidation in the dependent portion of the left upper lobe. These are likely secondary to multifocal pneumonia. Follow-up examination to resolution is recommended. 2. Severe emphysematous changes of bilateral upper lobes. 3. Multiple subcentimeter mediastinal lymph nodes, likely reactive. 4. No acute vascular abnormality. Emphysema (ICD10-J43.9). Electronically Signed   By: Larose Hires D.O.   On: 09/05/2022 23:56   DG Chest Port 1 View  Result Date: 09/05/2022 CLINICAL DATA:  Cough. EXAM: PORTABLE CHEST 1 VIEW COMPARISON:  Chest radiograph dated 09/05/2022. FINDINGS: No significant interval change in the bilateral pulmonary opacities compared to the earlier radiograph. No pleural effusion or pneumothorax. The cardiac silhouette is within limits. No acute osseous pathology. IMPRESSION: No significant interval change.  Continued follow-up recommended. Electronically Signed   By: Elgie Collard M.D.   On: 09/05/2022 22:09   DG Chest 2 View  Result Date: 09/05/2022 CLINICAL DATA:  Chronic cough, chills, night sweats, 20 pound weight loss. EXAM: CHEST - 2 VIEW COMPARISON:  Chest x-ray dated Aug 09, 2022. FINDINGS: Normal heart size. Prior mitral valve repair. Worsening patchy consolidation in the left upper lobe with new areas of pleural thickening and retraction of the left hilum. New mild patchy opacity in the superior segment of the right lower lobe in the basilar left lower lobe. No pleural effusion or pneumothorax. No acute osseous abnormality. IMPRESSION: 1. Progressive multifocal pneumonia, significantly worsened in the left upper lobe pneumonia with new areas of pleural thickening  and retraction of the left hilum. Atypical infections should be considered. CT chest with contrast may be helpful for further characterization. Electronically Signed   By: Obie Dredge M.D.   On: 09/05/2022 16:45     Pamella Pert, MD, PhD [facility]  Between 7 am - 7 pm I am available, please contact me via Amion (for emergencies) or Securechat (non urgent messages)  Between 7 pm - 7 am I am not available, please contact night coverage MD/APP via Amion

## 2022-09-06 NOTE — Progress Notes (Signed)
   Patient Name: Ryan Mathews, Ryan Mathews DOB: 04-Aug-1945 MRN: [id number] Transferring facility: [facility] Requesting provider: sheldon, md Reason for transfer:  77 yo WM with hx of HTN, diastolic CHF, COPD. with 1 month hx of cough, back pain. treated 1 month ago with augmentin/zithromax. not feeling better. WBC 14. CT chest LUL pneumonia, severe bullous emphysema. started on rocephin/zithromax. Going to: [facility] Admission Status: obs Bed Type: med/surg To Do:  [facility] will assume care on arrival to accepting facility. Until arrival, medical decision making responsibilities remain with the EDP.  However, [facility] available 24/7 for questions and assistance.   Nursing staff please page [facility] Admits and Consults [phone]) as soon as the patient arrives to the hospital.  Carollee Herter, DO [facility]

## 2022-09-06 NOTE — Assessment & Plan Note (Signed)
With Mod MR of repaired valve. No vegetation on this or other 3 valves per TEE 6 days ago.

## 2022-09-06 NOTE — Assessment & Plan Note (Addendum)
LUL CAP with symptoms ongoing for past month, failed outpt Augmentin + azithro. PNA pathway Rocephin + azithro Urine for legionella and s.pneumo RSV and COVID neg last month when tested DDx includes neoplasm with his smoking history, and possibly atypical / unusual organisms.  If still not improving despite IV ABx here in hospital, consult pulm to see if they need to bronch him.

## 2022-09-06 NOTE — Plan of Care (Signed)

## 2022-09-07 ENCOUNTER — Ambulatory Visit (HOSPITAL_COMMUNITY): Admission: RE | Admit: 2022-09-07 | Payer: PPO | Source: Home / Self Care | Admitting: Cardiology

## 2022-09-07 ENCOUNTER — Encounter (HOSPITAL_COMMUNITY): Admission: RE | Payer: Self-pay | Source: Home / Self Care

## 2022-09-07 DIAGNOSIS — Z8701 Personal history of pneumonia (recurrent): Secondary | ICD-10-CM | POA: Diagnosis not present

## 2022-09-07 DIAGNOSIS — K219 Gastro-esophageal reflux disease without esophagitis: Secondary | ICD-10-CM | POA: Diagnosis not present

## 2022-09-07 DIAGNOSIS — J440 Chronic obstructive pulmonary disease with acute lower respiratory infection: Secondary | ICD-10-CM | POA: Diagnosis not present

## 2022-09-07 DIAGNOSIS — K029 Dental caries, unspecified: Secondary | ICD-10-CM | POA: Diagnosis not present

## 2022-09-07 DIAGNOSIS — Z809 Family history of malignant neoplasm, unspecified: Secondary | ICD-10-CM | POA: Diagnosis not present

## 2022-09-07 DIAGNOSIS — J439 Emphysema, unspecified: Secondary | ICD-10-CM | POA: Diagnosis not present

## 2022-09-07 DIAGNOSIS — R197 Diarrhea, unspecified: Secondary | ICD-10-CM | POA: Diagnosis not present

## 2022-09-07 DIAGNOSIS — Z952 Presence of prosthetic heart valve: Secondary | ICD-10-CM | POA: Diagnosis not present

## 2022-09-07 DIAGNOSIS — I5032 Chronic diastolic (congestive) heart failure: Secondary | ICD-10-CM | POA: Diagnosis not present

## 2022-09-07 DIAGNOSIS — Z825 Family history of asthma and other chronic lower respiratory diseases: Secondary | ICD-10-CM | POA: Diagnosis not present

## 2022-09-07 DIAGNOSIS — Z803 Family history of malignant neoplasm of breast: Secondary | ICD-10-CM | POA: Diagnosis not present

## 2022-09-07 DIAGNOSIS — Z833 Family history of diabetes mellitus: Secondary | ICD-10-CM | POA: Diagnosis not present

## 2022-09-07 DIAGNOSIS — J189 Pneumonia, unspecified organism: Secondary | ICD-10-CM | POA: Diagnosis not present

## 2022-09-07 DIAGNOSIS — E440 Moderate protein-calorie malnutrition: Secondary | ICD-10-CM | POA: Diagnosis not present

## 2022-09-07 DIAGNOSIS — E785 Hyperlipidemia, unspecified: Secondary | ICD-10-CM | POA: Diagnosis not present

## 2022-09-07 DIAGNOSIS — E669 Obesity, unspecified: Secondary | ICD-10-CM | POA: Diagnosis not present

## 2022-09-07 DIAGNOSIS — Z87891 Personal history of nicotine dependence: Secondary | ICD-10-CM | POA: Diagnosis not present

## 2022-09-07 DIAGNOSIS — R0602 Shortness of breath: Secondary | ICD-10-CM | POA: Diagnosis present

## 2022-09-07 DIAGNOSIS — Z79899 Other long term (current) drug therapy: Secondary | ICD-10-CM | POA: Diagnosis not present

## 2022-09-07 DIAGNOSIS — Z6828 Body mass index (BMI) 28.0-28.9, adult: Secondary | ICD-10-CM | POA: Diagnosis not present

## 2022-09-07 DIAGNOSIS — M19041 Primary osteoarthritis, right hand: Secondary | ICD-10-CM | POA: Diagnosis not present

## 2022-09-07 DIAGNOSIS — Z6820 Body mass index (BMI) 20.0-20.9, adult: Secondary | ICD-10-CM | POA: Diagnosis not present

## 2022-09-07 DIAGNOSIS — I110 Hypertensive heart disease with heart failure: Secondary | ICD-10-CM | POA: Diagnosis not present

## 2022-09-07 DIAGNOSIS — Z8249 Family history of ischemic heart disease and other diseases of the circulatory system: Secondary | ICD-10-CM | POA: Diagnosis not present

## 2022-09-07 DIAGNOSIS — M1811 Unilateral primary osteoarthritis of first carpometacarpal joint, right hand: Secondary | ICD-10-CM | POA: Diagnosis not present

## 2022-09-07 DIAGNOSIS — Z7982 Long term (current) use of aspirin: Secondary | ICD-10-CM | POA: Diagnosis not present

## 2022-09-07 LAB — QUANTIFERON-TB GOLD PLUS
Mitogen-NIL: >10 IU/mL
NIL: 0.04 IU/mL
QuantiFERON-TB Gold Plus: NEGATIVE
TB1-NIL: 0 IU/mL
TB2-NIL: 0.01 IU/mL

## 2022-09-07 LAB — COMPREHENSIVE METABOLIC PANEL
ALT: 54 U/L — ABNORMAL HIGH (ref 0–44)
AST: 54 U/L — ABNORMAL HIGH (ref 15–41)
Albumin: 2.9 g/dL — ABNORMAL LOW (ref 3.5–5.0)
Alkaline Phosphatase: 72 U/L (ref 38–126)
Anion gap: 9 (ref 5–15)
BUN: 21 mg/dL (ref 8–23)
CO2: 22 mmol/L (ref 22–32)
Calcium: 8.5 mg/dL — ABNORMAL LOW (ref 8.9–10.3)
Chloride: 104 mmol/L (ref 98–111)
Creatinine, Ser: 1.01 mg/dL (ref 0.61–1.24)
GFR, Estimated: >60 mL/min (ref >60)
Glucose, Bld: 103 mg/dL — ABNORMAL HIGH (ref 70–99)
Potassium: 3.9 mmol/L (ref 3.5–5.1)
Sodium: 135 mmol/L (ref 135–145)
Total Bilirubin: 0.6 mg/dL (ref 0.3–1.2)
Total Protein: 7.2 g/dL (ref 6.5–8.1)

## 2022-09-07 LAB — CULTURE, BLOOD (ROUTINE X 2): Culture: NO GROWTH

## 2022-09-07 LAB — MAGNESIUM: Magnesium: 2 mg/dL (ref 1.7–2.4)

## 2022-09-07 LAB — CBC
HCT: 40.1 % (ref 39.0–52.0)
Hemoglobin: 12.9 g/dL — ABNORMAL LOW (ref 13.0–17.0)
MCH: 30.6 pg (ref 26.0–34.0)
MCHC: 32.2 g/dL (ref 30.0–36.0)
MCV: 95 fL (ref 80.0–100.0)
Platelets: 330 10*3/uL (ref 150–400)
RBC: 4.22 MIL/uL (ref 4.22–5.81)
RDW: 13.3 % (ref 11.5–15.5)
WBC: 12.2 10*3/uL — ABNORMAL HIGH (ref 4.0–10.5)
nRBC: 0 % (ref 0.0–0.2)

## 2022-09-07 SURGERY — ECHOCARDIOGRAM, TRANSESOPHAGEAL
Anesthesia: Monitor Anesthesia Care

## 2022-09-07 MED ORDER — IPRATROPIUM-ALBUTEROL 0.5-2.5 (3) MG/3ML IN SOLN: 3.0000 mL | Freq: Four times a day (QID) | RESPIRATORY_TRACT | Status: DC | PRN

## 2022-09-07 MED ORDER — ACETAMINOPHEN 500 MG PO TABS: 500.0000 mg | ORAL_TABLET | Freq: Four times a day (QID) | ORAL | Status: DC | PRN

## 2022-09-07 MED ORDER — ORAL CARE MOUTH RINSE: 15.0000 mL | OROMUCOSAL | Status: DC | PRN

## 2022-09-07 MED ORDER — GUAIFENESIN ER 600 MG PO TB12
1200.0000 mg | ORAL_TABLET | Freq: Two times a day (BID) | ORAL | Status: DC
  Administered 2022-09-07 – 2022-09-11 (×9): 1200 mg via ORAL
  Filled 2022-09-07 (×9): qty 2

## 2022-09-07 MED ORDER — ENSURE ENLIVE PO LIQD
237.0000 mL | Freq: Two times a day (BID) | ORAL | Status: DC
  Administered 2022-09-07 – 2022-09-11 (×8): 237 mL via ORAL

## 2022-09-07 NOTE — Progress Notes (Signed)
RT instructed patient and family on the use of a flutter valve. Patient able to demonstrate back good technique. 

## 2022-09-07 NOTE — Progress Notes (Addendum)
PROGRESS NOTE    Ryan Mathews  UJW:[id number] DOB: 1946/01/12 DOA: 09/05/2022 PCP: [name], MD   Brief Narrative: 77 year old past medical history significant for COPD with emphysema, prior tobacco use quit 15 years ago, mitral valve repair February 2023 with subsequent moderate MR, chronic diastolic heart failure presents with persistent symptoms of pneumonia.  Patient completed 10 days of antibiotics recently.  He continued to have cough night sweats he has had 20 pound weight loss.     Assessment & Plan:   Principal Problem:   CAP (community acquired pneumonia) Active Problems:   Bullous emphysema (HCC)   S/P MVR (mitral valve repair) 36 mm Simulus semi rigid band) via Heartport 04/29/2021  1-Multifocal pneumonia,  Poor dentition.  Continue with IV zosyn and Azithro He will need close follow up with oral maxillofacial Surgeon for teeth extraction.  Start Guaifenesin, Flutter Valve. Respiratory toilet.  Will try to send repeat sputum sample.  He will need at least 4 weeks of antibiotics for Treatment of PNA>  WBC trending down.  Blood culture no growth to date.   COPD, emphysema:  Quit smoking 15 years ago.  PRN nebulizer  Status post MVR  36 mm Simulus semi rigid band) via Heartport 04/29/2021  -TEE performed on 6/19 was negative for Vegetation.   Chronic diastolic heart failure: Continue with lasix and potassium supplement.   Essential hypertension: Continue with lasix, holding lisinopril.   Hyperlipidemia: on statins.   Transaminases; mild monitor on statins.       Estimated body mass index is 28.06 kg/m as calculated from the following:   Height as of this encounter: 5\' 9"  (1.753 m).   Weight as of this encounter: 86.2 kg.   DVT prophylaxis:  Code Status: Full code Family Communication: Wife at bedside.  Disposition Plan:  Status is: Observation The patient will require care spanning > 2 midnights and should be moved to inpatient  because: management of PNA    Consultants:  Pulmonology   Procedures:    Antimicrobials:    Subjective: He feels better since he was started on IV antibiotics. Cough persist, but is dry cough.  We talk about TEE results, negative for vegetation. Discussed bout results of Bmet and blood culture.  He is worry about been discharge on same antibiotics he was taking before. I  Will consider ID consult.   Objective: Vitals:   09/06/22 1451 09/06/22 1453 09/06/22 2006 09/07/22 0359  BP: 122/69 122/69 130/72 (!) 134/112  Pulse: 83 83 82 93  Resp: 18 18 17 19   Temp: 98.3 F (36.8 C) 98.3 F (36.8 C) 98.9 F (37.2 C) 97.8 F (36.6 C)  TempSrc: Oral  Oral Oral  SpO2: 95%  94% 93%  Weight:      Height:        Intake/Output Summary (Last 24 hours) at 09/07/2022 0841 Last data filed at 09/07/2022 0308 Gross per 24 hour  Intake 312.5 ml  Output --  Net 312.5 ml   Filed Weights   09/05/22 1809  Weight: 86.2 kg    Examination:  General exam: Appears calm and comfortable  Respiratory system: Clear to auscultation. Respiratory effort normal. Cardiovascular system: S1 & S2 heard, RRR. No JVD, murmurs, rubs, gallops or clicks. No pedal edema. Gastrointestinal system: Abdomen is nondistended, soft and nontender. No organomegaly or masses felt. Normal bowel sounds heard. Central nervous system: Alert and oriented.  Extremities: Symmetric 5 x 5 power.   Data Reviewed: I have personally reviewed following labs  and imaging studies  CBC: Recent Labs  Lab 08/31/22 1201 09/05/22 1627 09/05/22 1818 09/07/22 0349  WBC  --  12.7* 14.0* 12.2*  NEUTROABS  --  8.0*  --   --   HGB 14.3 13.5 13.6 12.9*  HCT 42.0 41.2 41.4 40.1  MCV  --  93.7 93.5 95.0  PLT  --  404.0* 384 330   Basic Metabolic Panel: Recent Labs  Lab 08/31/22 1201 09/05/22 1627 09/05/22 1818 09/07/22 0349  NA 138 136 134* 135  K 4.5 4.6 4.3 3.9  CL 104 100 101 104  CO2  --  26 22 22   GLUCOSE 96 99 101*  103*  BUN 24* 31* 31* 21  CREATININE 1.10 1.30 1.24 1.01  CALCIUM  --  9.3 9.5 8.5*  MG  --   --   --  2.0   GFR: Estimated Creatinine Clearance: 68.7 mL/min (by C-G formula based on SCr of 1.01 mg/dL). Liver Function Tests: Recent Labs  Lab 09/05/22 1627 09/07/22 0349  AST 57* 54*  ALT 48 54*  ALKPHOS 85 72  BILITOT 0.5 0.6  PROT 7.4 7.2  ALBUMIN 3.3* 2.9*   No results for input(s): "LIPASE", "AMYLASE" in the last 168 hours. No results for input(s): "AMMONIA" in the last 168 hours. Coagulation Profile: No results for input(s): "INR", "PROTIME" in the last 168 hours. Cardiac Enzymes: No results for input(s): "CKTOTAL", "CKMB", "CKMBINDEX", "TROPONINI" in the last 168 hours. BNP (last 3 results) Recent Labs    08/30/22 1355  PROBNP 301   HbA1C: No results for input(s): "HGBA1C" in the last 72 hours. CBG: No results for input(s): "GLUCAP" in the last 168 hours. Lipid Profile: No results for input(s): "CHOL", "HDL", "LDLCALC", "TRIG", "CHOLHDL", "LDLDIRECT" in the last 72 hours. Thyroid Function Tests: Recent Labs    09/05/22 1627  TSH 3.72   Anemia Panel: No results for input(s): "VITAMINB12", "FOLATE", "FERRITIN", "TIBC", "IRON", "RETICCTPCT" in the last 72 hours. Sepsis Labs: No results for input(s): "PROCALCITON", "LATICACIDVEN" in the last 168 hours.  Recent Results (from the past 240 hour(s))  Respiratory (~20 pathogens) panel by PCR     Status: None   Collection Time: 09/06/22 11:47 AM   Specimen: Nasopharyngeal Swab; Respiratory  Result Value Ref Range Status   Adenovirus NOT DETECTED NOT DETECTED Final   Coronavirus 229E NOT DETECTED NOT DETECTED Final    Comment: (NOTE) The Coronavirus on the Respiratory Panel, DOES NOT test for the novel  Coronavirus (2019 nCoV)    Coronavirus HKU1 NOT DETECTED NOT DETECTED Final   Coronavirus NL63 NOT DETECTED NOT DETECTED Final   Coronavirus OC43 NOT DETECTED NOT DETECTED Final   Metapneumovirus NOT DETECTED  NOT DETECTED Final   Rhinovirus / Enterovirus NOT DETECTED NOT DETECTED Final   Influenza A NOT DETECTED NOT DETECTED Final   Influenza B NOT DETECTED NOT DETECTED Final   Parainfluenza Virus 1 NOT DETECTED NOT DETECTED Final   Parainfluenza Virus 2 NOT DETECTED NOT DETECTED Final   Parainfluenza Virus 3 NOT DETECTED NOT DETECTED Final   Parainfluenza Virus 4 NOT DETECTED NOT DETECTED Final   Respiratory Syncytial Virus NOT DETECTED NOT DETECTED Final   Bordetella pertussis NOT DETECTED NOT DETECTED Final   Bordetella Parapertussis NOT DETECTED NOT DETECTED Final   Chlamydophila pneumoniae NOT DETECTED NOT DETECTED Final   Mycoplasma pneumoniae NOT DETECTED NOT DETECTED Final    Comment: Performed at [facility], [address] [place], [place] [address]  Expectorated Sputum Assessment  w Gram Stain, Rflx to Resp Cult     Status: None   Collection Time: 09/06/22  1:34 PM   Specimen: Sputum  Result Value Ref Range Status   Specimen Description EXPECTORATED SPUTUM  Final   Special Requests NONE  Final   Sputum evaluation   Final    Sputum specimen not acceptable for testing.  Please recollect.   Performed at [facility], [address] [place], [place] [address]    Report Status 09/06/2022 FINAL  Final  MRSA Next Gen by PCR, Nasal     Status: None   Collection Time: 09/06/22  4:30 PM   Specimen: Nasal Mucosa; Nasal Swab  Result Value Ref Range Status   MRSA by PCR Next Gen NOT DETECTED NOT DETECTED Final    Comment: (NOTE) The GeneXpert MRSA Assay (FDA approved for NASAL specimens only), is one component of a comprehensive MRSA colonization surveillance program. It is not intended to diagnose MRSA infection nor to guide or monitor treatment for MRSA infections. Test performance is not FDA approved in patients less than 16 years old. Performed at [facility], [address] [place], [place] [address]          Radiology  Studies: CT Chest W Contrast  Result Date: 09/05/2022 CLINICAL DATA:  Pneumonia, complication suspected. EXAM: CT CHEST WITH CONTRAST TECHNIQUE: Multidetector CT imaging of the chest was performed during intravenous contrast administration. RADIATION DOSE REDUCTION: This exam was performed according to the departmental dose-optimization program which includes automated exposure control, adjustment of the mA and/or kV according to patient size and/or use of iterative reconstruction technique. CONTRAST:  75mL OMNIPAQUE IOHEXOL 300 MG/ML  SOLN COMPARISON:  Chest radiograph performed earlier on the same date. FINDINGS: Cardiovascular: No significant vascular findings. Normal heart size. Prosthetic mitral valve with streak artifact. No pericardial effusion. Mediastinum/Nodes: Multiple subcentimeter paratracheal vascular sclerosis and subcarinal lymph nodes, likely reactive. No bulky lymphadenopathy. Lungs/Pleura: Severe emphysematous changes of bilateral upper lobes. Large consolidation in the left upper lobe with air bronchograms and small dependent left lung consolidation. There is also small consolidation in the dependent portion of the left upper lobe. These are likely secondary to multifocal pneumonia. No large pleural effusion or pneumothorax. Upper Abdomen: Small hiatal hernia.  No acute abdominal process. Musculoskeletal: No chest wall abnormality. No acute or significant osseous findings. IMPRESSION: 1. Large consolidation in the left upper lobe with air bronchograms and small dependent left lung consolidation. There is also small consolidation in the dependent portion of the left upper lobe. These are likely secondary to multifocal pneumonia. Follow-up examination to resolution is recommended. 2. Severe emphysematous changes of bilateral upper lobes. 3. Multiple subcentimeter mediastinal lymph nodes, likely reactive. 4. No acute vascular abnormality. Emphysema (ICD10-J43.9). Electronically Signed   By: Larose Hires D.O.   On: 09/05/2022 23:56   DG Chest Port 1 View  Result Date: 09/05/2022 CLINICAL DATA:  Cough. EXAM: PORTABLE CHEST 1 VIEW COMPARISON:  Chest radiograph dated 09/05/2022. FINDINGS: No significant interval change in the bilateral pulmonary opacities compared to the earlier radiograph. No pleural effusion or pneumothorax. The cardiac silhouette is within limits. No acute osseous pathology. IMPRESSION: No significant interval change.  Continued follow-up recommended. Electronically Signed   By: Elgie Collard M.D.   On: 09/05/2022 22:09   DG Chest 2 View  Result Date: 09/05/2022 CLINICAL DATA:  Chronic cough, chills, night sweats, 20 pound weight loss. EXAM: CHEST - 2 VIEW COMPARISON:  Chest x-ray dated Aug 09, 2022. FINDINGS: Normal heart size. Prior mitral valve repair. Worsening patchy consolidation in the left upper lobe with new areas of pleural thickening and retraction of the left hilum. New mild patchy opacity in the superior segment of the right lower lobe in the basilar left lower lobe. No pleural effusion or pneumothorax. No acute osseous abnormality. IMPRESSION: 1. Progressive multifocal pneumonia, significantly worsened in the left upper lobe pneumonia with new areas of pleural thickening and retraction of the left hilum. Atypical infections should be considered. CT chest with contrast may be helpful for further characterization. Electronically Signed   By: Obie Dredge M.D.   On: 09/05/2022 16:45        Scheduled Meds:  aspirin EC  81 mg Oral Daily   atorvastatin  20 mg Oral Daily   enoxaparin (LOVENOX) injection  40 mg Subcutaneous Q24H   furosemide  20 mg Oral Daily   pantoprazole  40 mg Oral Daily   Continuous Infusions:  azithromycin 250 mL/hr at 09/07/22 0308   piperacillin-tazobactam (ZOSYN)  IV 3.375 g (09/07/22 0351)     LOS: 0 days    Time spent: 35 minutes    Ameya Vowell A Sammantha Mehlhaff, MD [facility]   If 7PM-7AM, please contact  night-coverage [email or web address]  09/07/2022, 8:41 AM

## 2022-09-07 NOTE — Progress Notes (Signed)
Mobility Specialist - Progress Note   09/07/22 1327  Oxygen Therapy  SpO2 (!) 87 %  O2 Device Room Air  Patient Activity (if Appropriate) Ambulating  Mobility  Activity Ambulated independently in hallway  Level of Assistance Independent  Assistive Device None  Distance Ambulated (ft) 350 ft  Activity Response Tolerated well  Mobility Referral Yes  $Mobility charge 1 Mobility  Mobility Specialist Start Time (ACUTE ONLY) 0114  Mobility Specialist Stop Time (ACUTE ONLY) 0124  Mobility Specialist Time Calculation (min) (ACUTE ONLY) 10 min   Pt received in bed and agreeable to mobility. During ambulation, pt desat to 87%. Encouraged pursed lip breathing bring O2 back up to 91%. No complaints during session. Pt to bed after session with all needs met.    [facility]

## 2022-09-07 NOTE — Plan of Care (Signed)

## 2022-09-07 NOTE — Progress Notes (Signed)
NAME:  Ryan Mathews, MRN:  [id number], DOB:  1945-12-26, LOS: 0 ADMISSION DATE:  09/05/2022, CONSULTATION DATE:  09/07/22  REFERRING MD:  Dr Elvera Lennox, MD  CHIEF COMPLAINT:  SOB   History of Present Illness:  Ryan Mathews is a pleasant 77 yr old male with PMH notable for COPD with emphysema, prior tobacco use (quit 15 years ago), MV repair in Feb 2023 with subsequent moderate MR, chronic diastolic CHF who presents to hospital with persistent PNA symptoms for the past month. Pt states he saw his PCP at end of May with respiratory symptoms, was diagnosed with pneumonia and placed on a 10 day course of Augmentin along with azithromycin. Pt states he took the abx as prescribed however despite treatment his symptoms persisted. Pt states he has been having low grade temps as well as nighttime sweats since that time, accompanied by coughing fits which are occasionally productive. Pt states this productive cough is mostly "dry phlegm unsure of color". Pt also goes on to state he has experienced poor P.O. intake as well as a recent 20lb weight loss over the same interval.   PCCM was consulted for assistance in management of persistent pneumonia, SOB, coughing spells as well as possible bronchoscopy need.   Pertinent  Medical History  COPD with emphysema, prior tobacco use (quit 15 years ago), MV repair in Feb 2023 with subsequent moderate MR, chronic diastolic CHF  Significant Hospital Events: Including procedures, antibiotic start and stop dates in addition to other pertinent events   09/06/2022- consult placed to PCCM, Bcx2 pending, strep (-) urine legionella pending. Plan for bronchoscopy tomorrow 6/26 if pt cannot produce sputum sample on his own  Interim History / Subjective:  Feeling a bit better.  His wife says that his color is improved.  He has been able to walk around the unit with less shortness of breath.  Still with cough, scant sputum production.  Has not been able to give a sputum  sample yet  Objective   Blood pressure (!) 134/112, pulse 93, temperature 97.8 F (36.6 C), temperature source Oral, resp. rate 19, height 5\' 9"  (1.753 m), weight 86.2 kg, SpO2 93 %.        Intake/Output Summary (Last 24 hours) at 09/07/2022 4098 Last data filed at 09/07/2022 0308 Gross per 24 hour  Intake 312.5 ml  Output --  Net 312.5 ml    Filed Weights   09/05/22 1809  Weight: 86.2 kg    Examination: GEN: Chronically ill-appearing man, sitting up at bedside HEENT: Oropharynx clear, no stridor, strong voice Lungs: Scattered bilateral rhonchi, left greater than right.  No wheezes or crackles CV: Regular, distant, soft systolic murmur Abd: Nondistended with positive bowel sounds Ext: No edema Neuro: Awake, alert, interacting appropriately, follows commands   Resolved Hospital Problem list   N/A  Assessment & Plan:   Community Acquired Pneumonia, question anaerobic organism given poor dentition.  Failed outpatient treatment (Augmentin/azithromycin) Leukocytosis secondary to above -Agree with Zosyn and azithromycin IV.  Needs anaerobic coverage and failed oral antibiotics as an outpatient.  Should be able to transition back to a longer course of Augmentin once he stabilizes here and is ready for oral antibiotics.  Likely needs at least 4 weeks total antibiotics to cover complicated anaerobic infection -Follow blood cultures -Sputum culture if he is able to produce to help guide therapy -Will need repeat CT scan of the chest in 6 to 8 weeks to look for resolution of his pulmonary infiltrates -Needs  dental evaluation, consider role for extractions  COPD, Emphysema -Would benefit from outpatient pulmonary follow-up, PFT, workup for COPD   Best Practice (right click and "Reselect all SmartList Selections" daily)   Diet/type: Regular consistency (see orders) DVT prophylaxis: other lovenox GI prophylaxis: N/A Lines: N/A Foley:  N/A Code Status:  full code Last date of  multidisciplinary goals of care discussion [09/07/22 ]  Labs   CBC: Recent Labs  Lab 08/31/22 1201 09/05/22 1627 09/05/22 1818 09/07/22 0349  WBC  --  12.7* 14.0* 12.2*  NEUTROABS  --  8.0*  --   --   HGB 14.3 13.5 13.6 12.9*  HCT 42.0 41.2 41.4 40.1  MCV  --  93.7 93.5 95.0  PLT  --  404.0* 384 330     Basic Metabolic Panel: Recent Labs  Lab 08/31/22 1201 09/05/22 1627 09/05/22 1818 09/07/22 0349  NA 138 136 134* 135  K 4.5 4.6 4.3 3.9  CL 104 100 101 104  CO2  --  26 22 22   GLUCOSE 96 99 101* 103*  BUN 24* 31* 31* 21  CREATININE 1.10 1.30 1.24 1.01  CALCIUM  --  9.3 9.5 8.5*  MG  --   --   --  2.0    GFR: Estimated Creatinine Clearance: 68.7 mL/min (by C-G formula based on SCr of 1.01 mg/dL). Recent Labs  Lab 09/05/22 1627 09/05/22 1818 09/07/22 0349  WBC 12.7* 14.0* 12.2*     Liver Function Tests: Recent Labs  Lab 09/05/22 1627 09/07/22 0349  AST 57* 54*  ALT 48 54*  ALKPHOS 85 72  BILITOT 0.5 0.6  PROT 7.4 7.2  ALBUMIN 3.3* 2.9*   No results for input(s): "LIPASE", "AMYLASE" in the last 168 hours. No results for input(s): "AMMONIA" in the last 168 hours.  ABG    Component Value Date/Time   PHART 7.330 (L) 02/16/2021 1147   PCO2ART 41.5 02/16/2021 1147   PO2ART 183 (H) 02/16/2021 1147   HCO3 21.9 02/16/2021 1147   TCO2 26 08/31/2022 1201   ACIDBASEDEF 4.0 (H) 02/16/2021 1147   O2SAT 100.0 02/16/2021 1147      Critical care time: NA     Levy Pupa, MD, PhD 09/07/2022, 9:18 AM [facility] [phone] or if no answer before 7:00PM call [phone] For any issues after 7:00PM please call eLink [phone]

## 2022-09-08 ENCOUNTER — Telehealth: Payer: Self-pay | Admitting: Emergency Medicine

## 2022-09-08 ENCOUNTER — Inpatient Hospital Stay (HOSPITAL_COMMUNITY): Payer: PPO

## 2022-09-08 DIAGNOSIS — J189 Pneumonia, unspecified organism: Secondary | ICD-10-CM | POA: Diagnosis not present

## 2022-09-08 DIAGNOSIS — Z87891 Personal history of nicotine dependence: Secondary | ICD-10-CM

## 2022-09-08 DIAGNOSIS — Z79899 Other long term (current) drug therapy: Secondary | ICD-10-CM

## 2022-09-08 LAB — CBC
HCT: 38.8 % — ABNORMAL LOW (ref 39.0–52.0)
Hemoglobin: 12.5 g/dL — ABNORMAL LOW (ref 13.0–17.0)
MCH: 30.6 pg (ref 26.0–34.0)
MCHC: 32.2 g/dL (ref 30.0–36.0)
MCV: 95.1 fL (ref 80.0–100.0)
Platelets: 317 10*3/uL (ref 150–400)
RBC: 4.08 MIL/uL — ABNORMAL LOW (ref 4.22–5.81)
RDW: 13.1 % (ref 11.5–15.5)
WBC: 12.7 10*3/uL — ABNORMAL HIGH (ref 4.0–10.5)
nRBC: 0 % (ref 0.0–0.2)

## 2022-09-08 LAB — COMPREHENSIVE METABOLIC PANEL
ALT: 56 U/L — ABNORMAL HIGH (ref 0–44)
AST: 55 U/L — ABNORMAL HIGH (ref 15–41)
Albumin: 2.7 g/dL — ABNORMAL LOW (ref 3.5–5.0)
Alkaline Phosphatase: 74 U/L (ref 38–126)
Anion gap: 8 (ref 5–15)
BUN: 22 mg/dL (ref 8–23)
CO2: 24 mmol/L (ref 22–32)
Calcium: 8.2 mg/dL — ABNORMAL LOW (ref 8.9–10.3)
Chloride: 101 mmol/L (ref 98–111)
Creatinine, Ser: 1.2 mg/dL (ref 0.61–1.24)
GFR, Estimated: >60 mL/min (ref >60)
Glucose, Bld: 106 mg/dL — ABNORMAL HIGH (ref 70–99)
Potassium: 4 mmol/L (ref 3.5–5.1)
Sodium: 133 mmol/L — ABNORMAL LOW (ref 135–145)
Total Bilirubin: 0.7 mg/dL (ref 0.3–1.2)
Total Protein: 7.2 g/dL (ref 6.5–8.1)

## 2022-09-08 LAB — LEGIONELLA PNEUMOPHILA SEROGP 1 UR AG: L. pneumophila Serogp 1 Ur Ag: NEGATIVE

## 2022-09-08 MED ORDER — AZITHROMYCIN 250 MG PO TABS
500.0000 mg | ORAL_TABLET | Freq: Every day | ORAL | Status: DC
  Administered 2022-09-08: 500 mg via ORAL
  Filled 2022-09-08: qty 2

## 2022-09-08 MED ORDER — IOHEXOL 300 MG/ML  SOLN
75.0000 mL | Freq: Once | INTRAMUSCULAR | Status: AC | PRN
  Administered 2022-09-08: 100 mL via INTRAVENOUS

## 2022-09-08 NOTE — Consult Note (Signed)
[facility]                                                                                        Patient Identification: Patient Name: Ryan Mathews MRN: [id number] Admit Date: 09/05/2022  9:12 PM Today's Date: 09/08/2022 Reason for consult: Multifocal pneumonia  Requesting provider: Dr Sunnie Nielsen  Principal Problem:   CAP (community acquired pneumonia) Active Problems:   Bullous emphysema (HCC)   S/P MVR (mitral valve repair) 36 mm Simulus semi rigid band) via Heartport 04/29/2021   Antibiotics:  Azithromycin 6/24-6/26 Ceftriaxone 6/24, zosyn 6/25-c  Lines/Hardware:  Assessment 77 year old male with prior history of COPD, ex smoker, GERD, MR with MV annular ring repair, hernia mesh repair at [facility] 04/29/2021, who was sent to the ED by PCP on 6/24 for worsening PNA after failed OP tx.   # Multifocal pneumonia.  S/p 10 days course of augmentin and 5 days of azithromycin end of May with persistent symptoms  H/o GERD but atypical location for aspiration pneumonia. He does not seem to be a host for aspiration but has h/o GERD He has no known contact with TB. Quantiferon 6/24 is negative  6/25 HIV NR, RVP full panel negative, MRSA PCR is negative  EX smoker, needs repeat imaging to ensure does not need malignancy work up   Recommendations  Continue zosyn for now  Have instructed him to submit sputum sample to help in antibiotic choice.  Agree with Pulmonary that he needs 4 weeks of antibiotics this time than short course  Given his dental issues , I will order CT maxillofacial area which can be contributory to his pneumonia Monitor CBC and BMP Following  Rest of the management as per the primary team. Please call with questions or concerns.  Thank you for the consult  __________________________________________________________________________________________________________ HPI and  Hospital Course: 77 year old male with prior history of COPD, ex smoker, GERD, HLD, HTN, Obesity, MR with MV annular ring repair, hernia mesh repair at [facility] 04/29/2021 with subsequent MR and chronic diastolic CHF, L4/L5 microdiscectomy, bilateral groin hernia repair with mesh who was sent to the ED by PCP on 6/24 for worsening pneumonia.  Reports he had a sudden onset back pain at work, felt similar to when he had pneumonia before and was seen by his PCP and was treated for pneumonia with Augmentin and azithromycin approximately 10 days, EOT 6/7  However, he continued to have dry cough, pleuritic chest pain, difficulty breathing as well as night sweats and was seen by his PCP who noted chest x-ray had worrisome findings for multifocal pneumonia and sent to ED. denies any fever, chills.  Cough is mostly nonproductive. He also had a TEE on 6/19 by cardiology for concerns for valvular leak and was negative for vegetations or endocarditis.  Reports he had dental issues for couple of months for which she was supposed to see a dentist but has not seen yet.  Reports he has multiple broken teeth and that needs to be removed. Reports loss of 20 lbs in last month.   Has a dog, lives with wife, ex smoker, denies alcohol and IVDU. Works  as Radio broadcast assistant   At ED afebrile Labs remarkable for WBC 14 Imaging as below  ID consulted for abtx recs for multifocal pneumonia  ROS: General- Denies fever, chills, HEENT - Denies headache, blurry vision, neck pain, sinus pain CVS- Denies any dizziness/lightheadedness, syncopal attacks, palpitations Abdomen- Denies any nausea, vomiting, abdominal pain, hematochezia and diarrhea Neuro - Denies any weakness, numbness, tingling sensation Psych - Denies any changes in mood irritability or depressive symptoms GU- Denies any burning, dysuria, hematuria or increased frequency of urination Skin - denies any rashes/lesions MSK - denies any joint pain/swelling or restricted ROM    Past Medical History:  Diagnosis Date   Bilateral inguinal hernia (BIH) s/p lap repair 09/06/2012 08/09/2012   Cataract    CMC arthritis, thumb, degenerative    right   COPD (chronic obstructive pulmonary disease) (HCC)    Emphysema of lung (HCC)    Ex-smoker    GERD (gastroesophageal reflux disease)    History of chicken pox    History of pneumonia 08/06/2009        Hyperlipidemia    Hypertension    Nasal septal deviation 04/14/2017   Marked   Numbness    R cheek stays numb   Obesity (BMI 30-39.9) 08/09/2012   S/P MVR (mitral valve repair) 36 mm Simulus semi rigid band) via Heartport 04/29/2021 04/29/2021   S/p minimally invasive mitral valve repair (semirigid 36mm Simulus annuloplasty band) at [facility] (Dr Zebedee Iba) 04/29/2021. Followed locally by Dr Jacinto Halim. Has been recommended dental ppx.   Echo 05/2022 - normal LVEF 55-60%, mod concentric LVH, G2DD, normal wall motion, mild-mod dilated LA, mod MR with MV annular ring repair with wall impinging MR jet but improved, mild TR (Dr Melton Alar)   Past Surgical History:  Procedure Laterality Date   ANKLE SURGERY Left 1966   with tendon repair with stainless steel wire   CARDIAC CATHETERIZATION     COLONOSCOPY  07/2017   multiple tubular adenomas, rpt 3 yrs (Pyrtle)   INSERTION OF MESH Bilateral 09/06/2012   Procedure: INSERTION OF MESH;  Surgeon: Ardeth Sportsman, MD   LAPAROSCOPIC INGUINAL HERNIA WITH UMBILICAL HERNIA Bilateral 09/06/2012   Procedure: LAPAROSCOPIC exploration and repair of hernias in bellybutton and bilateral groins with mesh;  Surgeon: Ardeth Sportsman, MD   MICRODISCECTOMY LUMBAR Right 08/2020   L4/5 (Dr Maurice Small)   MITRAL VALVE REPAIR  04/29/2021   at [facility] Dr Zebedee Iba   RIGHT/LEFT HEART CATH AND CORONARY ANGIOGRAPHY N/A 02/16/2021   Procedure: RIGHT/LEFT HEART CATH AND CORONARY ANGIOGRAPHY;  Surgeon: Yates Decamp, MD;  Location: [facility] INVASIVE CV LAB;  Service: Cardiovascular;  Laterality: N/A;   TEE WITHOUT CARDIOVERSION N/A  02/12/2021   Procedure: TRANSESOPHAGEAL ECHOCARDIOGRAM (TEE);  Surgeon: Yates Decamp, MD;  Location: [facility] ENDOSCOPY;  Service: Cardiovascular;  Laterality: N/A;   TEE WITHOUT CARDIOVERSION N/A 08/31/2022   Procedure: TRANSESOPHAGEAL ECHOCARDIOGRAM;  Surgeon: Tessa Lerner, DO;  Location: [facility] INVASIVE CV LAB;  Service: Cardiovascular;  Laterality: N/A;     Scheduled Meds:  aspirin EC  81 mg Oral Daily   azithromycin  500 mg Oral Daily   enoxaparin (LOVENOX) injection  40 mg Subcutaneous Q24H   feeding supplement  237 mL Oral BID BM   furosemide  20 mg Oral Daily   guaiFENesin  1,200 mg Oral BID   pantoprazole  40 mg Oral Daily   Continuous Infusions:  piperacillin-tazobactam (ZOSYN)  IV 3.375 g (09/08/22 0416)   PRN Meds:.acetaminophen, guaiFENesin-dextromethorphan, ipratropium-albuterol, mouth rinse  No Known Allergies  Social History   Socioeconomic History   Marital status: Married    Spouse name: Not on file   Number of children: 4   Years of education: 12   Highest education level: Not on file  Occupational History   Not on file  Tobacco Use   Smoking status: Former    Packs/day: 2.00    Years: 40.00    Additional pack years: 0.00    Total pack years: 80.00    Types: Cigarettes    Start date: 03/14/1968    Quit date: 03/14/2006    Years since quitting: 16.4   Smokeless tobacco: Never  Vaping Use   Vaping Use: Never used  Substance and Sexual Activity   Alcohol use: Yes    Comment: Occasionally   Drug use: No   Sexual activity: Yes  Other Topics Concern   Not on file  Social History Narrative   Caffeine: 2 coffee, 2 tea/day   Lives with wife and 4 cats and 1 dog, grown children   Occupation: Radio broadcast assistant   Edu: HS   Activity: stays active at work and in garden   Diet: some water, fruits/vegetables daily   Social Determinants of Health   Financial Resource Strain: Low Risk  (07/11/2022)   Overall Financial Resource Strain (CARDIA)    Difficulty of Paying  Living Expenses: Not hard at all  Food Insecurity: No Food Insecurity (09/06/2022)   Hunger Vital Sign    Worried About Running Out of Food in the Last Year: Never true    Ran Out of Food in the Last Year: Never true  Transportation Needs: No Transportation Needs (09/06/2022)   PRAPARE - Administrator, Civil Service (Medical): No    Lack of Transportation (Non-Medical): No  Physical Activity: Inactive (07/11/2022)   Exercise Vital Sign    Days of Exercise per Week: 0 days    Minutes of Exercise per Session: 0 min  Stress: No Stress Concern Present (07/11/2022)   [employer] of Occupational Health - Occupational Stress Questionnaire    Feeling of Stress : Not at all  Social Connections: Moderately Isolated (07/11/2022)   Social Connection and Isolation Panel [NHANES]    Frequency of Communication with Friends and Family: More than three times a week    Frequency of Social Gatherings with Friends and Family: More than three times a week    Attends Religious Services: Never    Database administrator or Organizations: No    Attends Banker Meetings: Never    Marital Status: Married  Catering manager Violence: Not At Risk (09/06/2022)   Humiliation, Afraid, Rape, and Kick questionnaire    Fear of Current or Ex-Partner: No    Emotionally Abused: No    Physically Abused: No    Sexually Abused: No   Family History  Problem Relation Age of Onset   Cancer Mother        breast   Congenital heart disease Mother    Diabetes Mother    COPD Father    Cancer Sister        lung (minimal smoker)   Colon cancer Neg Hx    Esophageal cancer Neg Hx    Liver cancer Neg Hx    Rectal cancer Neg Hx    Stomach cancer Neg Hx    Pancreatic cancer Neg Hx    Vitals BP 126/70 (BP Location: Left Arm)   Pulse 83   Temp 98.2 F (36.8 C)  Resp 18   Ht 5\' 9"  (1.753 m)   Wt 86.2 kg   SpO2 94%   BMI 28.06 kg/m \  Physical Exam Constitutional: Adult male sitting in the  bed and coughing for collecting sputum specimen    Comments: HEENT-dental caries  Cardiovascular:     Rate and Rhythm: Normal rate and regular rhythm.     Heart sound: S1 and S2  Pulmonary:     Effort: Pulmonary effort is normal.     Comments: Coarse breath sounds bilaterally  Abdominal:     Palpations: Abdomen is soft.     Tenderness: Nondistended and nontender  Musculoskeletal:        General: No swelling or tenderness in peripheral joints  Skin:    Comments: No rashes  Neurological:     General: Awake, alert and oriented, following commands   Pertinent Microbiology Results for orders placed or performed during the hospital encounter of 09/05/22  Culture, blood (routine x 2)     Status: None (Preliminary result)   Collection Time: 09/05/22  6:20 PM   Specimen: BLOOD  Result Value Ref Range Status   Specimen Description   Final    BLOOD LEFT ANTECUBITAL Performed at [facility], [address], [place], [place] [address]    Special Requests   Final    BOTTLES DRAWN AEROBIC AND ANAEROBIC Blood Culture adequate volume Performed at [facility], [address], [place], [place] [address]    Culture   Final    NO GROWTH 2 DAYS Performed at [facility], [address], [place], [place] [address]    Report Status PENDING  Incomplete  Culture, blood (routine x 2)     Status: None (Preliminary result)   Collection Time: 09/05/22 10:45 PM   Specimen: BLOOD  Result Value Ref Range Status   Specimen Description   Final    BLOOD RIGHT ANTECUBITAL Performed at [facility], [address], [place], [place] [address]    Special Requests   Final    BOTTLES DRAWN AEROBIC AND ANAEROBIC Blood Culture adequate volume Performed at [facility], [address] [place], [place] [address]    Culture   Final    NO GROWTH 2 DAYS Performed at [facility], [address] [place],  [place] [address]    Report Status PENDING  Incomplete  Respiratory (~20 pathogens) panel by PCR     Status: None   Collection Time: 09/06/22 11:47 AM   Specimen: Nasopharyngeal Swab; Respiratory  Result Value Ref Range Status   Adenovirus NOT DETECTED NOT DETECTED Final   Coronavirus 229E NOT DETECTED NOT DETECTED Final    Comment: (NOTE) The Coronavirus on the Respiratory Panel, DOES NOT test for the novel  Coronavirus (2019 nCoV)    Coronavirus HKU1 NOT DETECTED NOT DETECTED Final   Coronavirus NL63 NOT DETECTED NOT DETECTED Final   Coronavirus OC43 NOT DETECTED NOT DETECTED Final   Metapneumovirus NOT DETECTED NOT DETECTED Final   Rhinovirus / Enterovirus NOT DETECTED NOT DETECTED Final   Influenza A NOT DETECTED NOT DETECTED Final   Influenza B NOT DETECTED NOT DETECTED Final   Parainfluenza Virus 1 NOT DETECTED NOT DETECTED Final   Parainfluenza Virus 2 NOT DETECTED NOT DETECTED Final   Parainfluenza Virus 3 NOT DETECTED NOT DETECTED Final   Parainfluenza Virus 4 NOT DETECTED NOT DETECTED Final   Respiratory Syncytial Virus NOT DETECTED NOT DETECTED Final   Bordetella pertussis NOT DETECTED  NOT DETECTED Final   Bordetella Parapertussis NOT DETECTED NOT DETECTED Final   Chlamydophila pneumoniae NOT DETECTED NOT DETECTED Final   Mycoplasma pneumoniae NOT DETECTED NOT DETECTED Final    Comment: Performed at [facility], [address] [place], [place] [address]  Expectorated Sputum Assessment w Gram Stain, Rflx to Resp Cult     Status: None   Collection Time: 09/06/22  1:34 PM   Specimen: Sputum  Result Value Ref Range Status   Specimen Description EXPECTORATED SPUTUM  Final   Special Requests NONE  Final   Sputum evaluation   Final    Sputum specimen not acceptable for testing.  Please recollect.   Performed at [facility], [address], [place], [place] [address]    Report Status 09/06/2022 FINAL  Final  MRSA Next Gen by PCR, Nasal     Status: None    Collection Time: 09/06/22  4:30 PM   Specimen: Nasal Mucosa; Nasal Swab  Result Value Ref Range Status   MRSA by PCR Next Gen NOT DETECTED NOT DETECTED Final    Comment: (NOTE) The GeneXpert MRSA Assay (FDA approved for NASAL specimens only), is one component of a comprehensive MRSA colonization surveillance program. It is not intended to diagnose MRSA infection nor to guide or monitor treatment for MRSA infections. Test performance is not FDA approved in patients less than 58 years old. Performed at [facility], [address], [place], [place] [address]     Pertinent Lab seen by me:    Latest Ref Rng & Units 09/08/2022    4:00 AM 09/07/2022    3:49 AM 09/05/2022    6:18 PM  CBC  WBC 4.0 - 10.5 K/uL 12.7  12.2  14.0   Hemoglobin 13.0 - 17.0 g/dL 91.4  78.2  95.6   Hematocrit 39.0 - 52.0 % 38.8  40.1  41.4   Platelets 150 - 400 K/uL 317  330  384       Latest Ref Rng & Units 09/08/2022    4:00 AM 09/07/2022    3:49 AM 09/05/2022    6:18 PM  CMP  Glucose 70 - 99 mg/dL 213  086  578   BUN 8 - 23 mg/dL 22  21  31    Creatinine 0.61 - 1.24 mg/dL 4.69  6.29  5.28   Sodium 135 - 145 mmol/L 133  135  134   Potassium 3.5 - 5.1 mmol/L 4.0  3.9  4.3   Chloride 98 - 111 mmol/L 101  104  101   CO2 22 - 32 mmol/L 24  22  22    Calcium 8.9 - 10.3 mg/dL 8.2  8.5  9.5   Total Protein 6.5 - 8.1 g/dL 7.2  7.2    Total Bilirubin 0.3 - 1.2 mg/dL 0.7  0.6    Alkaline Phos 38 - 126 U/L 74  72    AST 15 - 41 U/L 55  54    ALT 0 - 44 U/L 56  54      Pertinent Imagings/Other Imagings Plain films and CT images have been personally visualized and interpreted; radiology reports have been reviewed. Decision making incorporated into the Impression / Recommendations.  CT Chest W Contrast  Result Date: 09/05/2022 CLINICAL DATA:  Pneumonia, complication suspected. EXAM: CT CHEST WITH CONTRAST TECHNIQUE: Multidetector CT imaging of the chest was performed during intravenous contrast  administration. RADIATION DOSE REDUCTION: This exam was performed according to the departmental dose-optimization  program which includes automated exposure control, adjustment of the mA and/or kV according to patient size and/or use of iterative reconstruction technique. CONTRAST:  75mL OMNIPAQUE IOHEXOL 300 MG/ML  SOLN COMPARISON:  Chest radiograph performed earlier on the same date. FINDINGS: Cardiovascular: No significant vascular findings. Normal heart size. Prosthetic mitral valve with streak artifact. No pericardial effusion. Mediastinum/Nodes: Multiple subcentimeter paratracheal vascular sclerosis and subcarinal lymph nodes, likely reactive. No bulky lymphadenopathy. Lungs/Pleura: Severe emphysematous changes of bilateral upper lobes. Large consolidation in the left upper lobe with air bronchograms and small dependent left lung consolidation. There is also small consolidation in the dependent portion of the left upper lobe. These are likely secondary to multifocal pneumonia. No large pleural effusion or pneumothorax. Upper Abdomen: Small hiatal hernia.  No acute abdominal process. Musculoskeletal: No chest wall abnormality. No acute or significant osseous findings. IMPRESSION: 1. Large consolidation in the left upper lobe with air bronchograms and small dependent left lung consolidation. There is also small consolidation in the dependent portion of the left upper lobe. These are likely secondary to multifocal pneumonia. Follow-up examination to resolution is recommended. 2. Severe emphysematous changes of bilateral upper lobes. 3. Multiple subcentimeter mediastinal lymph nodes, likely reactive. 4. No acute vascular abnormality. Emphysema (ICD10-J43.9). Electronically Signed   By: Larose Hires D.O.   On: 09/05/2022 23:56   DG Chest Port 1 View  Result Date: 09/05/2022 CLINICAL DATA:  Cough. EXAM: PORTABLE CHEST 1 VIEW COMPARISON:  Chest radiograph dated 09/05/2022. FINDINGS: No significant interval change  in the bilateral pulmonary opacities compared to the earlier radiograph. No pleural effusion or pneumothorax. The cardiac silhouette is within limits. No acute osseous pathology. IMPRESSION: No significant interval change.  Continued follow-up recommended. Electronically Signed   By: Elgie Collard M.D.   On: 09/05/2022 22:09   DG Chest 2 View  Result Date: 09/05/2022 CLINICAL DATA:  Chronic cough, chills, night sweats, 20 pound weight loss. EXAM: CHEST - 2 VIEW COMPARISON:  Chest x-ray dated Aug 09, 2022. FINDINGS: Normal heart size. Prior mitral valve repair. Worsening patchy consolidation in the left upper lobe with new areas of pleural thickening and retraction of the left hilum. New mild patchy opacity in the superior segment of the right lower lobe in the basilar left lower lobe. No pleural effusion or pneumothorax. No acute osseous abnormality. IMPRESSION: 1. Progressive multifocal pneumonia, significantly worsened in the left upper lobe pneumonia with new areas of pleural thickening and retraction of the left hilum. Atypical infections should be considered. CT chest with contrast may be helpful for further characterization. Electronically Signed   By: Obie Dredge M.D.   On: 09/05/2022 16:45   ECHO TEE  Result Date: 08/31/2022    TRANSESOPHOGEAL ECHO REPORT   Patient Name:   TREVAUGHN SCHEAR Date of Exam: 08/31/2022 Medical Rec #:  [id number]          Height:       69.0 in Accession #:    [id number]         Weight:       190.0 lb Date of Birth:  1945-04-12           BSA:          2.021 m Patient Age:    77 years           BP:           111/49 mmHg Patient Gender: M  HR:           80 bpm. Exam Location:  Inpatient Procedure: Transesophageal Echo, 3D Echo, Color Doppler, Cardiac Doppler and            Saline Contrast Bubble Study Indications:     Mitral Regurgitation i34.0  History:         Patient has prior history of Echocardiogram examinations, most                  recent  06/09/2022. CHF, COPD; Risk Factors:Hypertension and                  Dyslipidemia. Mitral Valve Repair 04/29/21 with 36mm Simulus                  Semi-rigid band.  Sonographer:     Irving Burton Senior RDCS Referring Phys:  Yates Decamp MD Diagnosing Phys: Tessa Lerner DO PROCEDURE: After discussion of the risks and benefits of a TEE, an informed consent was obtained from the patient. The transesophogeal probe was passed without difficulty through the esophogus of the patient. Sedation performed by different physician. The patient was monitored while under deep sedation. Anesthestetic sedation was provided intravenously by Anesthesiology: 382mg  of Propofol. Supplementary images were obtained from transthoracic windows as indicated to answer the clinical question. The patient developed no complications during the procedure.  IMPRESSIONS  1. Left ventricular ejection fraction, by estimation, is 60 to 65%. The left ventricle has normal function. The left ventricle has no regional wall motion abnormalities. Left ventricular diastolic function could not be evaluated.  2. Right ventricular systolic function is normal. The right ventricular size is normal.  3. No left atrial/left atrial appendage thrombus was detected. The LAA emptying velocity was 46 cm/s.  4. A small pericardial effusion is present. The pericardial effusion is anterior to the right ventricle. There is no evidence of cardiac tamponade.  5. S/p mitral valve repair with 36mm Simulus semi rigid band. Well seated, leaflet mobility normal, moderate mitral regurgitation (multiple jets, resultant jet is eccentric along the anterior mitral leaflet) (PISA radius 0.83cm, aliasing velocity 34.6cm/sec, MR peak velocity 437cm/sec, MR VTI 108cm, ERO 0.3cm2, regurgitant volume 32mL/beat), no mitral stenosis (MG , HR 77bpm, BP 110/75), pulmonary vein flow is blunted. No evidence of mitral valve vegetation.  6. The aortic valve is normal in structure. Aortic valve regurgitation  is not visualized. No aortic stenosis is present.  7. There is mild (Grade II) layered plaque involving the descending aorta.  8. Agitated saline contrast bubble study was negative, with no evidence of any interatrial shunt. Agitated saline contrast bubble study is suggestive of shunting observed after >6 cardiac cycles suggestive of intrapulmonary shunting.  9. Rhythm strip during this exam demonstrates normal sinus rhythm. Conclusion(s)/Recommendation(s): No evidence of vegetation/infective endocarditis on this transesophageael echocardiogram. FINDINGS  Left Ventricle: Left ventricular ejection fraction, by estimation, is 60 to 65%. The left ventricle has normal function. The left ventricle has no regional wall motion abnormalities. The left ventricular internal cavity size was normal in size. There is  no left ventricular hypertrophy. Left ventricular diastolic function could not be evaluated due to mitral valve repair. Left ventricular diastolic function could not be evaluated. Right Ventricle: The right ventricular size is normal. No increase in right ventricular wall thickness. Right ventricular systolic function is normal. Left Atrium: Left atrial size was normal in size. No left atrial/left atrial appendage thrombus was detected. The LAA emptying velocity was 46 cm/s. Right Atrium: Right atrial  size was normal in size. Pericardium: A small pericardial effusion is present. The pericardial effusion is anterior to the right ventricle. There is no evidence of cardiac tamponade. Mitral Valve: S/p mitral valve repair with 36mm Simulus semi rigid band. Well seated, leaflet mobility normal, moderate mitral regurgitation (multiple jets, resultant jet is eccentric along the anterior mitral leaflet) (PISA radius 0.83cm, aliasing velocity 34.6cm/sec, MR peak velocity 437cm/sec, MR VTI 108cm, ERO 0.3cm2, regurgitant volume 19mL/beat), no mitral stenosis (MG , HR 77bpm, BP 110/75), pulmonary vein flow is blunted. No  evidence of mitral valve vegetation. MV peak gradient, 8.2 mmHg. The mean mitral valve gradient is 4.0 mmHg. Tricuspid Valve: The tricuspid valve is normal in structure. Tricuspid valve regurgitation is mild . No evidence of tricuspid stenosis. There is no evidence of tricuspid valve vegetation. Aortic Valve: The aortic valve is normal in structure. Aortic valve regurgitation is not visualized. No aortic stenosis is present. There is no evidence of aortic valve vegetation. Pulmonic Valve: The pulmonic valve was normal in structure. Pulmonic valve regurgitation is not visualized. No evidence of pulmonic stenosis. There is no evidence of pulmonic valve vegetation. Aorta: The aortic root and ascending aorta are structurally normal, with no evidence of dilitation. There is mild (Grade II) layered plaque involving the descending aorta. Venous: The right upper pulmonary vein and right lower pulmonary vein are normal. A systolic blunting flow pattern is recorded from the left upper pulmonary vein. IAS/Shunts: The interatrial septum appears to be lipomatous. The atrial septum is grossly normal. Agitated saline contrast was given intravenously to evaluate for intracardiac shunting. Agitated saline contrast bubble study was negative, with no evidence  of any interatrial shunt. Agitated saline contrast bubble study is suggestive of shunting observed after >6 cardiac cycles suggestive of intrapulmonary shunting. EKG: Rhythm strip during this exam demonstrates normal sinus rhythm. Additional Comments: Spectral Doppler performed. LEFT VENTRICLE PLAX 2D LVOT diam:     2.40 cm LV SV:         73 LV SV Index:   36 LVOT Area:     4.52 cm  LEFT ATRIUM             Index LA Vol (A2C):   62.3 ml 30.82 ml/m LA Vol (A4C):   46.5 ml 23.00 ml/m LA Biplane Vol: 55.5 ml 27.46 ml/m  AORTIC VALVE LVOT Vmax:   89.80 cm/s LVOT Vmean:  65.450 cm/s LVOT VTI:    0.161 m  AORTA Ao Root diam: 3.30 cm Ao Asc diam:  3.10 cm MITRAL VALVE                   TRICUSPID VALVE MV Area (PHT): 3.39 cm       TR Peak grad:   12.1 mmHg MV Area VTI:   0.67 cm       TR Vmax:        174.00 cm/s MV Peak grad:  8.2 mmHg MV Mean grad:  4.0 mmHg       SHUNTS MV Vmax:       1.43 m/s       Systemic VTI:  0.16 m MV Vmean:      89.3 cm/s      Systemic Diam: 2.40 cm MV VTI:        1.08 m MV Decel Time: 224 msec MR Peak grad:    76.4 mmHg MR Mean grad:    51.0 mmHg MR Vmax:         437.00 cm/s MR Vmean:  335.0 cm/s MR PISA Nyquist: 0.3 m/s MR PISA:         4.36 cm MR PISA Eff ROA: 35 mm MR PISA Radius:  0.83 cm MV E velocity: 101.00 cm/s MV A velocity: 126.00 cm/s MV E/A ratio:  0.80 Sunit Tolia DO Electronically signed by Tessa Lerner DO Signature Date/Time: 08/31/2022/10:08:41 PM    Final    EP STUDY  Result Date: 08/31/2022 See surgical note for result.   I have personally spent 85 minutes involved in face-to-face and non-face-to-face activities for this patient on the day of the visit. Professional time spent includes the following activities: Preparing to see the patient (review of tests), Obtaining and/or reviewing separately obtained history (admission/discharge record), Performing a medically appropriate examination and/or evaluation , Ordering medications/tests/procedures, referring and communicating with other health care professionals, Documenting clinical information in the EMR, Independently interpreting results (not separately reported), Communicating results to the patient/family/caregiver, Counseling and educating the patient/family/caregiver and Care coordination (not separately reported).  Electronically signed by:   Plan d/w requesting provider as well as ID pharm D  Note: This document was prepared using dragon voice recognition software and may include unintentional dictation errors.   Odette Fraction, MD Infectious Disease Physician [facility] Pager: [phone]

## 2022-09-08 NOTE — Telephone Encounter (Signed)
Please set the patient up with hosp f/u visit with Dr Judeth Horn or APP in 6 weeks to discuss improvement from PNA and to plan repeat CT chest. Thank you

## 2022-09-08 NOTE — Plan of Care (Signed)

## 2022-09-08 NOTE — Progress Notes (Signed)
PROGRESS NOTE    Ryan Mathews  ZOX:[id number] DOB: October 25, 1945 DOA: 09/05/2022 PCP: [name], MD   Brief Narrative: 77 year old past medical history significant for COPD with emphysema, prior tobacco use quit 15 years ago, mitral valve repair February 2023 with subsequent moderate MR, chronic diastolic heart failure presents with persistent symptoms of pneumonia.  Patient completed 10 days of antibiotics recently.  He continued to have cough night sweats he has had 20 pound weight loss.     Assessment & Plan:   Principal Problem:   CAP (community acquired pneumonia) Active Problems:   Bullous emphysema (HCC)   S/P MVR (mitral valve repair) 36 mm Simulus semi rigid band) via Heartport 04/29/2021  1-Multifocal pneumonia,  Poor dentition.  Continue with IV zosyn and Azithro He will need close follow up with oral maxillofacial Surgeon for teeth extraction.  Start Guaifenesin, Flutter Valve. Respiratory toilet.  Will try to send repeat sputum sample.  He will need at least 4 weeks of antibiotics for Treatment of PNA>  WBC trending down.  Blood culture no growth to date.  Plan to continue with another day of IV antibiotics.  Unable to provide sputum.  ID consulted for home antibiotics recommendations,. Patient is concern Augmentin didn't work last night.   COPD, emphysema:  Quit smoking 15 years ago.  PRN nebulizer  Status post MVR  36 mm Simulus semi rigid band) via Heartport 04/29/2021  -TEE performed on 6/19 was negative for Vegetation.   Chronic diastolic heart failure: Continue with lasix and potassium supplement.   Essential hypertension: Continue with lasix, holding lisinopril.   Hyperlipidemia: on statins.   Transaminases; mild monitor on statins.       Estimated body mass index is 28.06 kg/m as calculated from the following:   Height as of this encounter: 5\' 9"  (1.753 m).   Weight as of this encounter: 86.2 kg.   DVT prophylaxis:  Code  Status: Full code Family Communication: Wife at bedside.  Disposition Plan:  Status is: Observation The patient will require care spanning > 2 midnights and should be moved to inpatient because: management of PNA    Consultants:  Pulmonology   Procedures:    Antimicrobials:    Subjective: He feels better, appetitive is coming back. Still having dry cough. No much sputum production with mucinex.  Some SOB on exertion but improved.   Objective: Vitals:   09/07/22 1923 09/08/22 0403 09/08/22 0500 09/08/22 1143  BP: 120/67 126/70    Pulse: 84  83   Resp: 16 18    Temp: 98 F (36.7 C) 98.2 F (36.8 C)    TempSrc: Oral     SpO2: 95% 93% 96% 94%  Weight:      Height:        Intake/Output Summary (Last 24 hours) at 09/08/2022 1421 Last data filed at 09/07/2022 2100 Gross per 24 hour  Intake 209.48 ml  Output --  Net 209.48 ml    Filed Weights   09/05/22 1809  Weight: 86.2 kg    Examination:  General exam: NAD Respiratory system: CTA Cardiovascular system: S 1, S 2  RRR Gastrointestinal system: BS present, soft nt Central nervous system: alert, oriented.   Extremities: symmetric power   Data Reviewed: I have personally reviewed following labs and imaging studies  CBC: Recent Labs  Lab 09/05/22 1627 09/05/22 1818 09/07/22 0349 09/08/22 0400  WBC 12.7* 14.0* 12.2* 12.7*  NEUTROABS 8.0*  --   --   --   HGB  13.5 13.6 12.9* 12.5*  HCT 41.2 41.4 40.1 38.8*  MCV 93.7 93.5 95.0 95.1  PLT 404.0* 384 330 317    Basic Metabolic Panel: Recent Labs  Lab 09/05/22 1627 09/05/22 1818 09/07/22 0349 09/08/22 0400  NA 136 134* 135 133*  K 4.6 4.3 3.9 4.0  CL 100 101 104 101  CO2 26 22 22 24   GLUCOSE 99 101* 103* 106*  BUN 31* 31* 21 22  CREATININE 1.30 1.24 1.01 1.20  CALCIUM 9.3 9.5 8.5* 8.2*  MG  --   --  2.0  --     GFR: Estimated Creatinine Clearance: 57.9 mL/min (by C-G formula based on SCr of 1.2 mg/dL). Liver Function Tests: Recent Labs  Lab  09/05/22 1627 09/07/22 0349 09/08/22 0400  AST 57* 54* 55*  ALT 48 54* 56*  ALKPHOS 85 72 74  BILITOT 0.5 0.6 0.7  PROT 7.4 7.2 7.2  ALBUMIN 3.3* 2.9* 2.7*    No results for input(s): "LIPASE", "AMYLASE" in the last 168 hours. No results for input(s): "AMMONIA" in the last 168 hours. Coagulation Profile: No results for input(s): "INR", "PROTIME" in the last 168 hours. Cardiac Enzymes: No results for input(s): "CKTOTAL", "CKMB", "CKMBINDEX", "TROPONINI" in the last 168 hours. BNP (last 3 results) Recent Labs    08/30/22 1355  PROBNP 301    HbA1C: No results for input(s): "HGBA1C" in the last 72 hours. CBG: No results for input(s): "GLUCAP" in the last 168 hours. Lipid Profile: No results for input(s): "CHOL", "HDL", "LDLCALC", "TRIG", "CHOLHDL", "LDLDIRECT" in the last 72 hours. Thyroid Function Tests: Recent Labs    09/05/22 1627  TSH 3.72    Anemia Panel: No results for input(s): "VITAMINB12", "FOLATE", "FERRITIN", "TIBC", "IRON", "RETICCTPCT" in the last 72 hours. Sepsis Labs: No results for input(s): "PROCALCITON", "LATICACIDVEN" in the last 168 hours.  Recent Results (from the past 240 hour(s))  Culture, blood (routine x 2)     Status: None (Preliminary result)   Collection Time: 09/05/22  6:20 PM   Specimen: BLOOD  Result Value Ref Range Status   Specimen Description   Final    BLOOD LEFT ANTECUBITAL Performed at [facility], [address], [place], [place] [address]    Special Requests   Final    BOTTLES DRAWN AEROBIC AND ANAEROBIC Blood Culture adequate volume Performed at [facility], [address] [place], [place] [address]    Culture   Final    NO GROWTH 2 DAYS Performed at [facility], [address], [place], [place] [address]    Report Status PENDING  Incomplete  Culture, blood (routine x 2)     Status: None (Preliminary result)   Collection Time: 09/05/22 10:45 PM   Specimen: BLOOD   Result Value Ref Range Status   Specimen Description   Final    BLOOD RIGHT ANTECUBITAL Performed at [facility], [address] [place], [place] [address]    Special Requests   Final    BOTTLES DRAWN AEROBIC AND ANAEROBIC Blood Culture adequate volume Performed at [facility], [address], [place], [place] [address]    Culture   Final    NO GROWTH 2 DAYS Performed at [facility], [address], [place], [place] [address]    Report Status PENDING  Incomplete  Respiratory (~20 pathogens) panel by PCR     Status: None   Collection Time: 09/06/22 11:47 AM   Specimen: Nasopharyngeal Swab; Respiratory  Result  Value Ref Range Status   Adenovirus NOT DETECTED NOT DETECTED Final   Coronavirus 229E NOT DETECTED NOT DETECTED Final    Comment: (NOTE) The Coronavirus on the Respiratory Panel, DOES NOT test for the novel  Coronavirus (2019 nCoV)    Coronavirus HKU1 NOT DETECTED NOT DETECTED Final   Coronavirus NL63 NOT DETECTED NOT DETECTED Final   Coronavirus OC43 NOT DETECTED NOT DETECTED Final   Metapneumovirus NOT DETECTED NOT DETECTED Final   Rhinovirus / Enterovirus NOT DETECTED NOT DETECTED Final   Influenza A NOT DETECTED NOT DETECTED Final   Influenza B NOT DETECTED NOT DETECTED Final   Parainfluenza Virus 1 NOT DETECTED NOT DETECTED Final   Parainfluenza Virus 2 NOT DETECTED NOT DETECTED Final   Parainfluenza Virus 3 NOT DETECTED NOT DETECTED Final   Parainfluenza Virus 4 NOT DETECTED NOT DETECTED Final   Respiratory Syncytial Virus NOT DETECTED NOT DETECTED Final   Bordetella pertussis NOT DETECTED NOT DETECTED Final   Bordetella Parapertussis NOT DETECTED NOT DETECTED Final   Chlamydophila pneumoniae NOT DETECTED NOT DETECTED Final   Mycoplasma pneumoniae NOT DETECTED NOT DETECTED Final    Comment: Performed at [facility], [address] [place], [place] [address]  Expectorated Sputum Assessment w Gram Stain,  Rflx to Resp Cult     Status: None   Collection Time: 09/06/22  1:34 PM   Specimen: Sputum  Result Value Ref Range Status   Specimen Description EXPECTORATED SPUTUM  Final   Special Requests NONE  Final   Sputum evaluation   Final    Sputum specimen not acceptable for testing.  Please recollect.   Performed at [facility], [address], [place], [place] [address]    Report Status 09/06/2022 FINAL  Final  MRSA Next Gen by PCR, Nasal     Status: None   Collection Time: 09/06/22  4:30 PM   Specimen: Nasal Mucosa; Nasal Swab  Result Value Ref Range Status   MRSA by PCR Next Gen NOT DETECTED NOT DETECTED Final    Comment: (NOTE) The GeneXpert MRSA Assay (FDA approved for NASAL specimens only), is one component of a comprehensive MRSA colonization surveillance program. It is not intended to diagnose MRSA infection nor to guide or monitor treatment for MRSA infections. Test performance is not FDA approved in patients less than 60 years old. Performed at [facility], [address] [place], [place] [address]          Radiology Studies: No results found.      Scheduled Meds:  aspirin EC  81 mg Oral Daily   azithromycin  500 mg Oral Daily   enoxaparin (LOVENOX) injection  40 mg Subcutaneous Q24H   feeding supplement  237 mL Oral BID BM   furosemide  20 mg Oral Daily   guaiFENesin  1,200 mg Oral BID   pantoprazole  40 mg Oral Daily   Continuous Infusions:  piperacillin-tazobactam (ZOSYN)  IV 3.375 g (09/08/22 1400)     LOS: 1 day    Time spent: 35 minutes    Chamya Hunton A Daphnie Venturini, MD [facility]   If 7PM-7AM, please contact night-coverage [email or web address]  09/08/2022, 2:21 PM

## 2022-09-08 NOTE — Progress Notes (Signed)
Mobility Specialist - Progress Note   09/08/22 1143  Oxygen Therapy  SpO2 94 %  O2 Device Room Air  Patient Activity (if Appropriate) Ambulating  Mobility  Activity Ambulated independently in hallway  Level of Assistance Independent  Assistive Device None  Distance Ambulated (ft) 350 ft  Activity Response Tolerated well  Mobility Referral Yes  $Mobility charge 1 Mobility  Mobility Specialist Start Time (ACUTE ONLY) 1134  Mobility Specialist Stop Time (ACUTE ONLY) 1142  Mobility Specialist Time Calculation (min) (ACUTE ONLY) 8 min   Pt received in room standing and agreeable to mobility. No complaints during session. Pt to room after session with all needs met.    [facility]

## 2022-09-08 NOTE — Progress Notes (Addendum)
Patient reported some irritation to L forearm during IV Zithromax infusion, did not report until infusion was completed. Patient states the L AC IV itself is not tender, no pinkness seen around insertion site. NP Anthoney Harada notified and aware. Patient reporting no other symptoms at this time, will continue to monitor. Next dose not due until 6/27 @ 2200, will dilute with NS KVO to help reduce irritation.   On reassessment, L AC is pink and tender to touch around IV site. Removed IV and applied warm compress to site.

## 2022-09-09 DIAGNOSIS — J189 Pneumonia, unspecified organism: Secondary | ICD-10-CM | POA: Diagnosis not present

## 2022-09-09 DIAGNOSIS — E440 Moderate protein-calorie malnutrition: Secondary | ICD-10-CM | POA: Insufficient documentation

## 2022-09-09 LAB — BASIC METABOLIC PANEL
Anion gap: 11 (ref 5–15)
BUN: 20 mg/dL (ref 8–23)
CO2: 22 mmol/L (ref 22–32)
Calcium: 8.7 mg/dL — ABNORMAL LOW (ref 8.9–10.3)
Chloride: 102 mmol/L (ref 98–111)
Creatinine, Ser: 1.11 mg/dL (ref 0.61–1.24)
GFR, Estimated: >60 mL/min (ref >60)
Glucose, Bld: 113 mg/dL — ABNORMAL HIGH (ref 70–99)
Potassium: 4.1 mmol/L (ref 3.5–5.1)
Sodium: 135 mmol/L (ref 135–145)

## 2022-09-09 LAB — EXPECTORATED SPUTUM ASSESSMENT W GRAM STAIN, RFLX TO RESP C

## 2022-09-09 LAB — CULTURE, BLOOD (ROUTINE X 2): Special Requests: ADEQUATE

## 2022-09-09 LAB — CBC
HCT: 40.8 % (ref 39.0–52.0)
Hemoglobin: 13.6 g/dL (ref 13.0–17.0)
MCH: 31.1 pg (ref 26.0–34.0)
MCHC: 33.3 g/dL (ref 30.0–36.0)
MCV: 93.4 fL (ref 80.0–100.0)
Platelets: 363 10*3/uL (ref 150–400)
RBC: 4.37 MIL/uL (ref 4.22–5.81)
RDW: 12.9 % (ref 11.5–15.5)
WBC: 12.9 10*3/uL — ABNORMAL HIGH (ref 4.0–10.5)
nRBC: 0 % (ref 0.0–0.2)

## 2022-09-09 LAB — CULTURE, RESPIRATORY W GRAM STAIN

## 2022-09-09 NOTE — Progress Notes (Signed)
Initial Nutrition Assessment  DOCUMENTATION CODES:   Non-severe (moderate) malnutrition in context of acute illness/injury  INTERVENTION:  - Heart Healthy Diet.  - Ensure Plus High Protein po BID, each supplement provides 350 kcal and 20 grams of protein. - Monitor weight trends.   NUTRITION DIAGNOSIS:   Moderate Malnutrition related to acute illness as evidenced by energy intake < 75% for > 7 days, percent weight loss (9% in 1 month).  GOAL:   Patient will meet greater than or equal to 90% of their needs  MONITOR:   PO intake, Supplement acceptance  REASON FOR ASSESSMENT:   Malnutrition Screening Tool    ASSESSMENT:   77 y.o. male with PMH significant for COPD with emphysema and chronic diastolic heart failure who presented with persistent symptoms of pneumonia.  Patient sitting in bedside chair at time of visit, wife at bedside.   He reports a UBW of 210# and weight loss over the past 1 month due to eating poorly. Per EMR, patient has had a 19# or 9% weight loss in the past 1 month, which is significant for the time frame.  States he normally eats 3 meals a day and was doing this until 1 month ago. Reports he was started on an antibiotic around that time that made his taste buds change and food not taste good. Appetite also poor during that time. Has only been eating a very small amounts of foods like jello and drinking mostly liquids such as water and gatorade.  Thankfully, patient states his taste buds are now slowly returning back to normal as is his appetite. Reports eating a good dinner last night and breakfast this morning. No intake documented in chart. He has also been consuming Ensure and enjoys.   Medications reviewed and include: Lasix  Labs reviewed:  -   NUTRITION - FOCUSED PHYSICAL EXAM: Subcutaneous Fat:  Orbital Region: No depletion Upper Arm Region: No depletion Thoracic and Lumbar Region: No depletion Buccal Region: No depletion Muscle:   Temple Region: No depletion Clavicle Bone Region: No depletion Clavicle and Acromion Bone Region: No depletion Scapular Bone Region: Unable to assess Dorsal Hand: No depletion Patellar Region: No depletion Anterior Thigh Region: No depletion Posterior Calf Region: No depletion   Diet Order:   Diet Order             Diet Heart Room service appropriate? Yes; Fluid consistency: Thin  Diet effective now                   EDUCATION NEEDS:  Education needs have been addressed  Skin:  Skin Assessment: Reviewed RN Assessment  Last BM:  6/26  Height:  Ht Readings from Last 1 Encounters:  09/05/22 5\' 9"  (1.753 m)   Weight:  Wt Readings from Last 1 Encounters:  09/05/22 86.2 kg   BMI:  Body mass index is 28.06 kg/m.  Estimated Nutritional Needs:  Kcal:  2150-2350 kcals Protein:  80-95 grams Fluid:  >/= 2.1L    Shelle Iron RD, LDN For contact information, refer to [facility].

## 2022-09-09 NOTE — Plan of Care (Signed)
  Problem: Education: Goal: Knowledge of General Education information will improve Description Including pain rating scale, medication(s)/side effects and non-pharmacologic comfort measures Outcome: Progressing   Problem: Health Behavior/Discharge Planning: Goal: Ability to manage health-related needs will improve Outcome: Progressing   

## 2022-09-09 NOTE — Progress Notes (Signed)
Mobility Specialist - Progress Note   09/09/22 1041  Mobility  Activity Ambulated independently in hallway  Level of Assistance Independent  Assistive Device None  Distance Ambulated (ft) 350 ft  Activity Response Tolerated well  Mobility Referral Yes  $Mobility charge 1 Mobility  Mobility Specialist Start Time (ACUTE ONLY) 1011  Mobility Specialist Stop Time (ACUTE ONLY) 1019  Mobility Specialist Time Calculation (min) (ACUTE ONLY) 8 min   Pt received in bed and agreeable to mobility. No complaints during session. Pt to bed after session with all needs met.    Pre-mobility: 92% SpO2 During mobility: 91% SpO2 Post-mobility: 91% SPO2  Chief Technology Officer

## 2022-09-09 NOTE — Progress Notes (Signed)
PROGRESS NOTE    Ryan Mathews  ZOX:[id number] DOB: 1945/08/06 DOA: 09/05/2022 PCP: [name], MD   Brief Narrative: 77 year old past medical history significant for COPD with emphysema, prior tobacco use quit 15 years ago, mitral valve repair February 2023 with subsequent moderate MR, chronic diastolic heart failure presents with persistent symptoms of pneumonia.  Patient completed 10 days of antibiotics recently.  He continued to have cough night sweats he has had 20 pound weight loss.  Assessment & Plan:   Principal Problem:   CAP (community acquired pneumonia) Active Problems:   Bullous emphysema (HCC)   S/P MVR (mitral valve repair) 36 mm Simulus semi rigid band) via Heartport 04/29/2021   Medication management   Malnutrition of moderate degree  1-Multifocal pneumonia,  Poor dentition.  Continue with IV zosyn. Completed azithromycin.  He will need close follow up with oral maxillofacial Surgeon for teeth extraction.  Started  Guaifenesin, Flutter Valve. Respiratory toilet.  He will need at least 4 weeks of antibiotics for Treatment of PNA>  WBC down to 12.  Blood culture no growth to date.  ID consulted for home antibiotics recommendations,. Patient is concern Augmentin didn't work last time.  Discussed with Dr Zigmund Daniel, we can use Cefpodoxime and Flagyl if patient does not want to try Augmentin.  Sputum culture pending, he was able to give sample today.   COPD, emphysema:  Quit smoking 15 years ago.  PRN nebulizer  Status post MVR  36 mm Simulus semi rigid band) via Heartport 04/29/2021  -TEE performed on 6/19 was negative for Vegetation.   Chronic diastolic heart failure: Continue with lasix and potassium supplement.   Essential hypertension: Continue with lasix, holding lisinopril.   Hyperlipidemia: on statins.   Transaminases; mild monitor on statins.       Estimated body mass index is 28.06 kg/m as calculated from the following:   Height as  of this encounter: 5\' 9"  (1.753 m).   Weight as of this encounter: 86.2 kg.   DVT prophylaxis:  Code Status: Full code Family Communication: Wife at bedside.  Disposition Plan:  Status is: Observation The patient will require care spanning > 2 midnights and should be moved to inpatient because: management of PNA    Consultants:  Pulmonology   Procedures:    Antimicrobials:    Subjective: Improving. Report cough, breathing improved,. He got SOB today while bathing, washing.  We discussed repeat chest x ray wont change management and we wont see significant changes from previous.    Objective: Vitals:   09/08/22 2015 09/09/22 0425 09/09/22 0900 09/09/22 0918  BP: 133/72 (!) 140/70 112/76 112/76  Pulse: 87 95 84 85  Resp: 18 18 18 18   Temp: 98.5 F (36.9 C) 98.2 F (36.8 C) 97.8 F (36.6 C) 97.8 F (36.6 C)  TempSrc:   Oral Oral  SpO2: 95% 94% 94% 95%  Weight:      Height:       No intake or output data in the 24 hours ending 09/09/22 1756  Filed Weights   09/05/22 1809  Weight: 86.2 kg    Examination:  General exam: NAD Respiratory system: CTA Cardiovascular system: S 1, S 2 RRR Gastrointestinal system: BS present, soft, nt Central nervous system: alert Extremities: no edema  Data Reviewed: I have personally reviewed following labs and imaging studies  CBC: Recent Labs  Lab 09/05/22 1627 09/05/22 1818 09/07/22 0349 09/08/22 0400 09/09/22 0613  WBC 12.7* 14.0* 12.2* 12.7* 12.9*  NEUTROABS 8.0*  --   --   --   --  HGB 13.5 13.6 12.9* 12.5* 13.6  HCT 41.2 41.4 40.1 38.8* 40.8  MCV 93.7 93.5 95.0 95.1 93.4  PLT 404.0* 384 330 317 363    Basic Metabolic Panel: Recent Labs  Lab 09/05/22 1627 09/05/22 1818 09/07/22 0349 09/08/22 0400 09/09/22 0613  NA 136 134* 135 133* 135  K 4.6 4.3 3.9 4.0 4.1  CL 100 101 104 101 102  CO2 26 22 22 24 22   GLUCOSE 99 101* 103* 106* 113*  BUN 31* 31* 21 22 20   CREATININE 1.30 1.24 1.01 1.20 1.11   CALCIUM 9.3 9.5 8.5* 8.2* 8.7*  MG  --   --  2.0  --   --     GFR: Estimated Creatinine Clearance: 62.5 mL/min (by C-G formula based on SCr of 1.11 mg/dL). Liver Function Tests: Recent Labs  Lab 09/05/22 1627 09/07/22 0349 09/08/22 0400  AST 57* 54* 55*  ALT 48 54* 56*  ALKPHOS 85 72 74  BILITOT 0.5 0.6 0.7  PROT 7.4 7.2 7.2  ALBUMIN 3.3* 2.9* 2.7*    No results for input(s): "LIPASE", "AMYLASE" in the last 168 hours. No results for input(s): "AMMONIA" in the last 168 hours. Coagulation Profile: No results for input(s): "INR", "PROTIME" in the last 168 hours. Cardiac Enzymes: No results for input(s): "CKTOTAL", "CKMB", "CKMBINDEX", "TROPONINI" in the last 168 hours. BNP (last 3 results) Recent Labs    08/30/22 1355  PROBNP 301    HbA1C: No results for input(s): "HGBA1C" in the last 72 hours. CBG: No results for input(s): "GLUCAP" in the last 168 hours. Lipid Profile: No results for input(s): "CHOL", "HDL", "LDLCALC", "TRIG", "CHOLHDL", "LDLDIRECT" in the last 72 hours. Thyroid Function Tests: No results for input(s): "TSH", "T4TOTAL", "FREET4", "T3FREE", "THYROIDAB" in the last 72 hours.  Anemia Panel: No results for input(s): "VITAMINB12", "FOLATE", "FERRITIN", "TIBC", "IRON", "RETICCTPCT" in the last 72 hours. Sepsis Labs: No results for input(s): "PROCALCITON", "LATICACIDVEN" in the last 168 hours.  Recent Results (from the past 240 hour(s))  Culture, blood (routine x 2)     Status: None (Preliminary result)   Collection Time: 09/05/22  6:20 PM   Specimen: BLOOD  Result Value Ref Range Status   Specimen Description   Final    BLOOD LEFT ANTECUBITAL Performed at [facility], [address], [place], [place] [address]    Special Requests   Final    BOTTLES DRAWN AEROBIC AND ANAEROBIC Blood Culture adequate volume Performed at [facility], [address] [place], [place] [address]    Culture   Final    NO GROWTH  3 DAYS Performed at [facility], [address], [place], [place] [address]    Report Status PENDING  Incomplete  Culture, blood (routine x 2)     Status: None (Preliminary result)   Collection Time: 09/05/22 10:45 PM   Specimen: BLOOD  Result Value Ref Range Status   Specimen Description   Final    BLOOD RIGHT ANTECUBITAL Performed at [facility], [address], [place], [place] [address]    Special Requests   Final    BOTTLES DRAWN AEROBIC AND ANAEROBIC Blood Culture adequate volume Performed at [facility], [address] [place], [place] [address]    Culture   Final    NO GROWTH 3 DAYS Performed at [facility], [address], [place], [place] [address]    Report Status PENDING  Incomplete  Respiratory (~20 pathogens) panel by PCR  Status: None   Collection Time: 09/06/22 11:47 AM   Specimen: Nasopharyngeal Swab; Respiratory  Result Value Ref Range Status   Adenovirus NOT DETECTED NOT DETECTED Final   Coronavirus 229E NOT DETECTED NOT DETECTED Final    Comment: (NOTE) The Coronavirus on the Respiratory Panel, DOES NOT test for the novel  Coronavirus (2019 nCoV)    Coronavirus HKU1 NOT DETECTED NOT DETECTED Final   Coronavirus NL63 NOT DETECTED NOT DETECTED Final   Coronavirus OC43 NOT DETECTED NOT DETECTED Final   Metapneumovirus NOT DETECTED NOT DETECTED Final   Rhinovirus / Enterovirus NOT DETECTED NOT DETECTED Final   Influenza A NOT DETECTED NOT DETECTED Final   Influenza B NOT DETECTED NOT DETECTED Final   Parainfluenza Virus 1 NOT DETECTED NOT DETECTED Final   Parainfluenza Virus 2 NOT DETECTED NOT DETECTED Final   Parainfluenza Virus 3 NOT DETECTED NOT DETECTED Final   Parainfluenza Virus 4 NOT DETECTED NOT DETECTED Final   Respiratory Syncytial Virus NOT DETECTED NOT DETECTED Final   Bordetella pertussis NOT DETECTED NOT DETECTED Final   Bordetella Parapertussis NOT DETECTED NOT DETECTED Final    Chlamydophila pneumoniae NOT DETECTED NOT DETECTED Final   Mycoplasma pneumoniae NOT DETECTED NOT DETECTED Final    Comment: Performed at [facility], [address] [place], [place] [address]  Expectorated Sputum Assessment w Gram Stain, Rflx to Resp Cult     Status: None   Collection Time: 09/06/22  1:34 PM   Specimen: Sputum  Result Value Ref Range Status   Specimen Description EXPECTORATED SPUTUM  Final   Special Requests NONE  Final   Sputum evaluation   Final    Sputum specimen not acceptable for testing.  Please recollect.   Performed at [facility], [address], [place], [place] [address]    Report Status 09/06/2022 FINAL  Final  MRSA Next Gen by PCR, Nasal     Status: None   Collection Time: 09/06/22  4:30 PM   Specimen: Nasal Mucosa; Nasal Swab  Result Value Ref Range Status   MRSA by PCR Next Gen NOT DETECTED NOT DETECTED Final    Comment: (NOTE) The GeneXpert MRSA Assay (FDA approved for NASAL specimens only), is one component of a comprehensive MRSA colonization surveillance program. It is not intended to diagnose MRSA infection nor to guide or monitor treatment for MRSA infections. Test performance is not FDA approved in patients less than 62 years old. Performed at [facility], [address] [place], [place] [address]   Expectorated Sputum Assessment w Gram Stain, Rflx to Resp Cult     Status: None   Collection Time: 09/09/22  8:21 AM   Specimen: Expectorated Sputum  Result Value Ref Range Status   Specimen Description EXPECTORATED SPUTUM  Final   Special Requests NONE  Final   Sputum evaluation   Final    THIS SPECIMEN IS ACCEPTABLE FOR SPUTUM CULTURE Performed at [facility], [address] [place], [place] [address]    Report Status 09/09/2022 FINAL  Final  Culture, Respiratory w Gram Stain     Status: None (Preliminary result)   Collection Time: 09/09/22  8:21 AM  Result Value Ref  Range Status   Specimen Description   Final    EXPECTORATED SPUTUM Performed at [facility], [address], [place], [place] [address]    Special Requests   Final    NONE Reflexed from [id number] Performed at [facility],  [address] [place], [place] [address]    Gram Stain   Final    NO WBC SEEN RARE BUDDING YEAST SEEN Performed at [facility], [address], [place], [place] [address]    Culture PENDING  Incomplete   Report Status PENDING  Incomplete         Radiology Studies: CT MAXILLOFACIAL W CONTRAST  Result Date: 09/08/2022 CLINICAL DATA:  h/o dental pain and now with mulifocal pneumonia EXAM: CT MAXILLOFACIAL WITH CONTRAST TECHNIQUE: Multidetector CT imaging of the maxillofacial structures was performed with intravenous contrast. Multiplanar CT image reconstructions were also generated. RADIATION DOSE REDUCTION: This exam was performed according to the departmental dose-optimization program which includes automated exposure control, adjustment of the mA and/or kV according to patient size and/or use of iterative reconstruction technique. CONTRAST:  OMNIPAQUE IOHEXOL 300 MG/ML  SOLN COMPARISON:  None Available. FINDINGS: Osseous: No fracture or mandibular dislocation. No destructive process. Orbits: Bilateral lens replacement. No traumatic or inflammatory finding. Sinuses: Clear. Soft tissues: Odontogenic disease with multiple large dental caries there is no evidence of an odontogenic soft tissue abscess. Limited intracranial: No significant or unexpected finding. IMPRESSION: Odontogenic disease with multiple large dental caries. No evidence of an odontogenic soft tissue abscess. Electronically Signed   By: Lorenza Cambridge M.D.   On: 09/08/2022 18:44        Scheduled Meds:  aspirin EC  81 mg Oral Daily   enoxaparin (LOVENOX) injection  40 mg Subcutaneous Q24H   feeding supplement  237 mL Oral BID BM   furosemide  20 mg  Oral Daily   guaiFENesin  1,200 mg Oral BID   pantoprazole  40 mg Oral Daily   Continuous Infusions:  piperacillin-tazobactam (ZOSYN)  IV 3.375 g (09/09/22 1144)     LOS: 2 days    Time spent: 35 minutes    Damoney Julia A Paytyn Mesta, MD [facility]   If 7PM-7AM, please contact night-coverage [email or web address]  09/09/2022, 5:56 PM

## 2022-09-09 NOTE — Progress Notes (Signed)
[facility] Infectious Diseases Follow Up Note  Patient Identification: Patient Name: Ryan Mathews MRN: [id number] Admit Date: 09/05/2022  9:12 PM Age: 77 y.o.Today's Date: 09/09/2022  Reason for Visit: Follow-up on multifocal pneumonia  Principal Problem:   CAP (community acquired pneumonia) Active Problems:   Bullous emphysema (HCC)   S/P MVR (mitral valve repair) 36 mm Simulus semi rigid band) via Heartport 04/29/2021   Medication management  Antibiotics:  Azithromycin 6/24- Ceftriaxone 6/24, zosyn 6/25-c   Lines/Hardware:  Interval Events: Remains afebrile, he is unable to produce phlegm CT maxillofacial findings noted  Assessment 77 year old male with prior history of COPD, ex smoker, GERD, MR with MV annular ring repair, hernia mesh repair at [facility] 04/29/2021, who was sent to the ED by PCP on 6/24 for worsening PNA after failed OP tx.    # Multifocal pneumonia.  S/p 10 days course of augmentin and 5 days of azithromycin end of May with persistent symptoms  H/o GERD but atypical location for aspiration pneumonia. He does not seem to be a host for aspiration but has h/o GERD He has no known contact with TB. Quantiferon 6/24 is negative  6/25 HIV NR, RVP full panel negative, MRSA PCR is negative  EX smoker, needs repeat imaging to ensure does not need malignancy work up  Per Pulm, need anaerobic coverage and possible transition to a longer course of PO augmentin for approx 4 weeks for complicated anaerobic infection. Needs repeat CT scan of the chest in 6 to 8 weeks to look for resolution of his pulmonary infiltrates. Needs dental evaluation, consider role for extractions   Recommendations DC azithromycin as he has completed more than 3 days course  If unable to produce phlegm for antibiotic guidance, can do PO augmentin when ready to go home for a month. He does not have any strong risk factors for PsA or known PsA  colonizaton/infection  He is aware about  need to see dentist for teeth extractions  He has a fu with myself scheduled with myself at [facility] on 7/23 at 4 pm  Fu with Pulm as instructed for repeat CT   Rest of the management as per the primary team. Thank you for the consult. Please page with pertinent questions or concerns.  ______________________________________________________________________ Subjective patient seen and examined at the bedside. Eager to go home. Some cough. Able to walk without oxygen. Denies nausea, vomiting and diarrhea   Vitals BP 112/76 (BP Location: Left Arm)   Pulse 85   Temp 97.8 F (36.6 C) (Oral)   Resp 18   Ht 5\' 9"  (1.753 m)   Wt 86.2 kg   SpO2 95%   BMI 28.06 kg/m   Physical Exam Constitutional: Adult male sitting in the bed, not coughing as much like yesterday    Comments:   Cardiovascular:     Rate and Rhythm: Normal rate and regular rhythm.     Heart sounds:  Pulmonary:     Effort: Pulmonary effort is normal on room air    Comments: Able to speak in full sentences  Abdominal:     Palpations: Abdomen is soft.     Tenderness: Nondistended  Musculoskeletal:        General: No swelling or tenderness.   Skin:    Comments: No rashes  Neurological:     General: Awake, alert and oriented, following commands  Psychiatric:        Mood and Affect: Mood normal.   Pertinent Microbiology Results for orders placed or  performed during the hospital encounter of 09/05/22  Culture, blood (routine x 2)     Status: None (Preliminary result)   Collection Time: 09/05/22  6:20 PM   Specimen: BLOOD  Result Value Ref Range Status   Specimen Description   Final    BLOOD LEFT ANTECUBITAL Performed at [facility], [address], [place], [place] [address]    Special Requests   Final    BOTTLES DRAWN AEROBIC AND ANAEROBIC Blood Culture adequate volume Performed at [facility], [address]  [place], [place] [address]    Culture   Final    NO GROWTH 3 DAYS Performed at [facility], [address] [place], [place] [address]    Report Status PENDING  Incomplete  Culture, blood (routine x 2)     Status: None (Preliminary result)   Collection Time: 09/05/22 10:45 PM   Specimen: BLOOD  Result Value Ref Range Status   Specimen Description   Final    BLOOD RIGHT ANTECUBITAL Performed at [facility], [address], [place], [place] [address]    Special Requests   Final    BOTTLES DRAWN AEROBIC AND ANAEROBIC Blood Culture adequate volume Performed at [facility], [address] [place], [place] [address]    Culture   Final    NO GROWTH 3 DAYS Performed at [facility], [address], [place], [place] [address]    Report Status PENDING  Incomplete  Respiratory (~20 pathogens) panel by PCR     Status: None   Collection Time: 09/06/22 11:47 AM   Specimen: Nasopharyngeal Swab; Respiratory  Result Value Ref Range Status   Adenovirus NOT DETECTED NOT DETECTED Final   Coronavirus 229E NOT DETECTED NOT DETECTED Final    Comment: (NOTE) The Coronavirus on the Respiratory Panel, DOES NOT test for the novel  Coronavirus (2019 nCoV)    Coronavirus HKU1 NOT DETECTED NOT DETECTED Final   Coronavirus NL63 NOT DETECTED NOT DETECTED Final   Coronavirus OC43 NOT DETECTED NOT DETECTED Final   Metapneumovirus NOT DETECTED NOT DETECTED Final   Rhinovirus / Enterovirus NOT DETECTED NOT DETECTED Final   Influenza A NOT DETECTED NOT DETECTED Final   Influenza B NOT DETECTED NOT DETECTED Final   Parainfluenza Virus 1 NOT DETECTED NOT DETECTED Final   Parainfluenza Virus 2 NOT DETECTED NOT DETECTED Final   Parainfluenza Virus 3 NOT DETECTED NOT DETECTED Final   Parainfluenza Virus 4 NOT DETECTED NOT DETECTED Final   Respiratory Syncytial Virus NOT DETECTED NOT DETECTED Final   Bordetella pertussis NOT DETECTED NOT DETECTED Final    Bordetella Parapertussis NOT DETECTED NOT DETECTED Final   Chlamydophila pneumoniae NOT DETECTED NOT DETECTED Final   Mycoplasma pneumoniae NOT DETECTED NOT DETECTED Final    Comment: Performed at [facility], [address] [place], [place] [address]  Expectorated Sputum Assessment w Gram Stain, Rflx to Resp Cult     Status: None   Collection Time: 09/06/22  1:34 PM   Specimen: Sputum  Result Value Ref Range Status   Specimen Description EXPECTORATED SPUTUM  Final   Special Requests NONE  Final   Sputum evaluation   Final    Sputum specimen not acceptable for testing.  Please recollect.   Performed at [facility], [address] [place], [place] [address]    Report Status 09/06/2022 FINAL  Final  MRSA Next Gen by PCR, Nasal     Status: None  Collection Time: 09/06/22  4:30 PM   Specimen: Nasal Mucosa; Nasal Swab  Result Value Ref Range Status   MRSA by PCR Next Gen NOT DETECTED NOT DETECTED Final    Comment: (NOTE) The GeneXpert MRSA Assay (FDA approved for NASAL specimens only), is one component of a comprehensive MRSA colonization surveillance program. It is not intended to diagnose MRSA infection nor to guide or monitor treatment for MRSA infections. Test performance is not FDA approved in patients less than 46 years old. Performed at [facility], [address], [place], [place] [address]     Pertinent Lab.    Latest Ref Rng & Units 09/09/2022    6:13 AM 09/08/2022    4:00 AM 09/07/2022    3:49 AM  CBC  WBC 4.0 - 10.5 K/uL 12.9  12.7  12.2   Hemoglobin 13.0 - 17.0 g/dL 46.9  62.9  52.8   Hematocrit 39.0 - 52.0 % 40.8  38.8  40.1   Platelets 150 - 400 K/uL 363  317  330       Latest Ref Rng & Units 09/09/2022    6:13 AM 09/08/2022    4:00 AM 09/07/2022    3:49 AM  CMP  Glucose 70 - 99 mg/dL 413  244  010   BUN 8 - 23 mg/dL 20  22  21    Creatinine 0.61 - 1.24 mg/dL 2.72  5.36  6.44   Sodium 135 - 145 mmol/L 135  133   135   Potassium 3.5 - 5.1 mmol/L 4.1  4.0  3.9   Chloride 98 - 111 mmol/L 102  101  104   CO2 22 - 32 mmol/L 22  24  22    Calcium 8.9 - 10.3 mg/dL 8.7  8.2  8.5   Total Protein 6.5 - 8.1 g/dL  7.2  7.2   Total Bilirubin 0.3 - 1.2 mg/dL  0.7  0.6   Alkaline Phos 38 - 126 U/L  74  72   AST 15 - 41 U/L  55  54   ALT 0 - 44 U/L  56  54     Pertinent Imaging today Plain films and CT images have been personally visualized and interpreted; radiology reports have been reviewed. Decision making incorporated into the Impression /   CT MAXILLOFACIAL W CONTRAST  Result Date: 09/08/2022 CLINICAL DATA:  h/o dental pain and now with mulifocal pneumonia EXAM: CT MAXILLOFACIAL WITH CONTRAST TECHNIQUE: Multidetector CT imaging of the maxillofacial structures was performed with intravenous contrast. Multiplanar CT image reconstructions were also generated. RADIATION DOSE REDUCTION: This exam was performed according to the departmental dose-optimization program which includes automated exposure control, adjustment of the mA and/or kV according to patient size and/or use of iterative reconstruction technique. CONTRAST:  OMNIPAQUE IOHEXOL 300 MG/ML  SOLN COMPARISON:  None Available. FINDINGS: Osseous: No fracture or mandibular dislocation. No destructive process. Orbits: Bilateral lens replacement. No traumatic or inflammatory finding. Sinuses: Clear. Soft tissues: Odontogenic disease with multiple large dental caries there is no evidence of an odontogenic soft tissue abscess. Limited intracranial: No significant or unexpected finding. IMPRESSION: Odontogenic disease with multiple large dental caries. No evidence of an odontogenic soft tissue abscess. Electronically Signed   By: Lorenza Cambridge M.D.   On: 09/08/2022 18:44    I have personally spent 51  minutes involved in face-to-face and non-face-to-face activities for this patient on the day of the visit. Professional time spent includes the following activities:  Preparing to  see the patient (review of tests), Obtaining and/or reviewing separately obtained history (admission/discharge record), Performing a medically appropriate examination and/or evaluation , Ordering medications/tests/procedures, referring and communicating with other health care professionals, Documenting clinical information in the EMR, Independently interpreting results (not separately reported), Communicating results to the patient/family/caregiver, Counseling and educating the patient/family/caregiver and Care coordination (not separately reported).   Plan d/w requesting provider as well as ID pharm D  Note: This document was prepared using dragon voice recognition software and may include unintentional dictation errors.   Electronically signed by:   Odette Fraction, MD Infectious Disease Physician [facility] for Infectious Disease Pager: [phone]

## 2022-09-09 NOTE — Progress Notes (Signed)
Mobility Specialist - Progress Note   09/09/22 1537  Mobility  Activity Ambulated independently in hallway  Level of Assistance Independent  Assistive Device None  Distance Ambulated (ft) 350 ft  Activity Response Tolerated well  Mobility Referral Yes  $Mobility charge 1 Mobility  Mobility Specialist Start Time (ACUTE ONLY) 0330  Mobility Specialist Stop Time (ACUTE ONLY) 0336  Mobility Specialist Time Calculation (min) (ACUTE ONLY) 6 min   Pt received in bed and agreeable to mobility. Pt had several coughing bouts throughout ambulation. No complaints during session. Pt to EOB after session with all needs met.    [facility]

## 2022-09-10 DIAGNOSIS — J189 Pneumonia, unspecified organism: Secondary | ICD-10-CM | POA: Diagnosis not present

## 2022-09-10 LAB — CBC
HCT: 40.4 % (ref 39.0–52.0)
Hemoglobin: 13.1 g/dL (ref 13.0–17.0)
MCH: 30.5 pg (ref 26.0–34.0)
MCHC: 32.4 g/dL (ref 30.0–36.0)
MCV: 94.2 fL (ref 80.0–100.0)
Platelets: 354 10*3/uL (ref 150–400)
RBC: 4.29 MIL/uL (ref 4.22–5.81)
RDW: 13.1 % (ref 11.5–15.5)
WBC: 13.1 10*3/uL — ABNORMAL HIGH (ref 4.0–10.5)
nRBC: 0 % (ref 0.0–0.2)

## 2022-09-10 LAB — CULTURE, RESPIRATORY W GRAM STAIN: Gram Stain: NONE SEEN

## 2022-09-10 LAB — BASIC METABOLIC PANEL
Anion gap: 8 (ref 5–15)
BUN: 21 mg/dL (ref 8–23)
CO2: 26 mmol/L (ref 22–32)
Calcium: 8.5 mg/dL — ABNORMAL LOW (ref 8.9–10.3)
Chloride: 100 mmol/L (ref 98–111)
Creatinine, Ser: 1.19 mg/dL (ref 0.61–1.24)
GFR, Estimated: >60 mL/min (ref >60)
Glucose, Bld: 111 mg/dL — ABNORMAL HIGH (ref 70–99)
Potassium: 4 mmol/L (ref 3.5–5.1)
Sodium: 134 mmol/L — ABNORMAL LOW (ref 135–145)

## 2022-09-10 LAB — CULTURE, BLOOD (ROUTINE X 2)

## 2022-09-10 MED ORDER — DICLOFENAC SODIUM 1 % EX GEL
2.0000 g | Freq: Four times a day (QID) | CUTANEOUS | Status: DC
  Administered 2022-09-10 – 2022-09-11 (×4): 2 g via TOPICAL
  Filled 2022-09-10: qty 100

## 2022-09-10 MED ORDER — SACCHAROMYCES BOULARDII 250 MG PO CAPS
250.0000 mg | ORAL_CAPSULE | Freq: Two times a day (BID) | ORAL | Status: DC
  Administered 2022-09-10 – 2022-09-11 (×3): 250 mg via ORAL
  Filled 2022-09-10 (×3): qty 1

## 2022-09-10 MED ORDER — SODIUM CHLORIDE 0.9 % IV SOLN: INTRAVENOUS | Status: AC

## 2022-09-10 MED ORDER — ACETAMINOPHEN 325 MG PO TABS
650.0000 mg | ORAL_TABLET | Freq: Four times a day (QID) | ORAL | Status: DC | PRN
  Administered 2022-09-10 – 2022-09-11 (×2): 650 mg via ORAL
  Filled 2022-09-10 (×2): qty 2

## 2022-09-10 NOTE — Progress Notes (Signed)
Mobility Specialist - Progress Note   09/10/22 1454  Mobility  Activity Ambulated independently in hallway  Level of Assistance Independent  Assistive Device None  Distance Ambulated (ft) 350 ft  Activity Response Tolerated well  Mobility Referral Yes  $Mobility charge 1 Mobility  Mobility Specialist Start Time (ACUTE ONLY) 0244  Mobility Specialist Stop Time (ACUTE ONLY) 0252  Mobility Specialist Time Calculation (min) (ACUTE ONLY) 8 min   Pt received EOB and agreeable to mobility. No complains during session. Pt to bed after session with all needs met.    [facility]

## 2022-09-10 NOTE — Progress Notes (Signed)
Mobility Specialist - Progress Note   09/10/22 1052  Mobility  Activity Ambulated independently in hallway  Level of Assistance Independent  Assistive Device None  Distance Ambulated (ft) 350 ft  Activity Response Tolerated well  Mobility Referral Yes  $Mobility charge 1 Mobility  Mobility Specialist Start Time (ACUTE ONLY) 1038  Mobility Specialist Stop Time (ACUTE ONLY) 1050  Mobility Specialist Time Calculation (min) (ACUTE ONLY) 12 min   Pt received in bed and agreeable to mobility. No complaints during session. Pt to bed after session with all needs met.    [facility]

## 2022-09-10 NOTE — Progress Notes (Signed)
PROGRESS NOTE    FOUNTAIN LICEAGA  MWN:[id number] DOB: 1946-01-27 DOA: 09/05/2022 PCP: [name], MD   Brief Narrative: 77 year old past medical history significant for COPD with emphysema, prior tobacco use quit 15 years ago, mitral valve repair February 2023 with subsequent moderate MR, chronic diastolic heart failure presents with persistent symptoms of pneumonia.  Patient completed 10 days of antibiotics recently.  He continued to have cough night sweats he has had 20 pound weight loss.  Assessment & Plan:   Principal Problem:   CAP (community acquired pneumonia) Active Problems:   Bullous emphysema (HCC)   S/P MVR (mitral valve repair) 36 mm Simulus semi rigid band) via Heartport 04/29/2021   Medication management   Malnutrition of moderate degree  1-Multifocal pneumonia,  Poor dentition.  Continue with IV zosyn. Completed azithromycin.  He will need close follow up with oral maxillofacial Surgeon for teeth extraction.  Started  Guaifenesin, Flutter Valve. Respiratory toilet.  He will need at least 4 weeks of antibiotics for Treatment of PNA>  WBC down to 12.  Blood culture no growth to date.  ID consulted for home antibiotics recommendations,. Patient is concern Augmentin didn't work last time.  Discussed with Dr Zigmund Daniel, we can use Cefpodoxime and Flagyl if patient does not want to try Augmentin.  Sputum culture only with few yeast.  WBC mildly increase today at 13, will keep one more day on IV antibiotics. Repeat labs in am.   COPD, emphysema:  Quit smoking 15 years ago.  PRN nebulizer  Status post MVR  36 mm Simulus semi rigid band) via Heartport 04/29/2021  -TEE performed on 6/19 was negative for Vegetation.   Chronic diastolic heart failure: Continue with lasix and potassium supplement.   Essential hypertension: Continue with lasix, holding lisinopril.   Hyperlipidemia: on statins.   Transaminases; mild monitor on statins.   Diarrhea; report 2  episode of diarrhea. Denies abdominal pain. Plan to give him IV fluids for 5 hours. Start florastore.   Arthritis; start Voltaren gel. PRN tylenol/    Estimated body mass index is 27.44 kg/m as calculated from the following:   Height as of this encounter: 5\' 9"  (1.753 m).   Weight as of this encounter: 84.3 kg.   DVT prophylaxis:  Code Status: Full code Family Communication: Wife at bedside.  Disposition Plan:  Status is: Observation The patient will require care spanning > 2 midnights and should be moved to inpatient because: management of PNA    Consultants:  Pulmonology   Procedures:    Antimicrobials:    Subjective: He report 2 watery stool. Denies abdominal pain.  Report pain in his right hands and knee. He has arthritis.   Objective: Vitals:   09/09/22 2007 09/10/22 0541 09/10/22 0900 09/10/22 1216  BP: 135/75 117/64  (!) 145/93  Pulse: 86 78  92  Resp: 16 18  14   Temp: 98.4 F (36.9 C) 98.6 F (37 C)  98.2 F (36.8 C)  TempSrc:    Oral  SpO2: 95% 94%  97%  Weight:   84.3 kg   Height:        Intake/Output Summary (Last 24 hours) at 09/10/2022 1404 Last data filed at 09/10/2022 0945 Gross per 24 hour  Intake 120 ml  Output --  Net 120 ml    Filed Weights   09/05/22 1809 09/10/22 0900  Weight: 86.2 kg 84.3 kg    Examination:  General exam: NAD Respiratory system: CTA Cardiovascular system: S 1, S 2 RRR  Gastrointestinal system: BS present, soft, nt Central nervous system: Alert Extremities: no edema  Data Reviewed: I have personally reviewed following labs and imaging studies  CBC: Recent Labs  Lab 09/05/22 1627 09/05/22 1818 09/07/22 0349 09/08/22 0400 09/09/22 0613 09/10/22 0425  WBC 12.7* 14.0* 12.2* 12.7* 12.9* 13.1*  NEUTROABS 8.0*  --   --   --   --   --   HGB 13.5 13.6 12.9* 12.5* 13.6 13.1  HCT 41.2 41.4 40.1 38.8* 40.8 40.4  MCV 93.7 93.5 95.0 95.1 93.4 94.2  PLT 404.0* 384 330 317 363 354    Basic Metabolic  Panel: Recent Labs  Lab 09/05/22 1818 09/07/22 0349 09/08/22 0400 09/09/22 0613 09/10/22 0425  NA 134* 135 133* 135 134*  K 4.3 3.9 4.0 4.1 4.0  CL 101 104 101 102 100  CO2 22 22 24 22 26   GLUCOSE 101* 103* 106* 113* 111*  BUN 31* 21 22 20 21   CREATININE 1.24 1.01 1.20 1.11 1.19  CALCIUM 9.5 8.5* 8.2* 8.7* 8.5*  MG  --  2.0  --   --   --     GFR: Estimated Creatinine Clearance: 53.6 mL/min (by C-G formula based on SCr of 1.19 mg/dL). Liver Function Tests: Recent Labs  Lab 09/05/22 1627 09/07/22 0349 09/08/22 0400  AST 57* 54* 55*  ALT 48 54* 56*  ALKPHOS 85 72 74  BILITOT 0.5 0.6 0.7  PROT 7.4 7.2 7.2  ALBUMIN 3.3* 2.9* 2.7*    No results for input(s): "LIPASE", "AMYLASE" in the last 168 hours. No results for input(s): "AMMONIA" in the last 168 hours. Coagulation Profile: No results for input(s): "INR", "PROTIME" in the last 168 hours. Cardiac Enzymes: No results for input(s): "CKTOTAL", "CKMB", "CKMBINDEX", "TROPONINI" in the last 168 hours. BNP (last 3 results) Recent Labs    08/30/22 1355  PROBNP 301    HbA1C: No results for input(s): "HGBA1C" in the last 72 hours. CBG: No results for input(s): "GLUCAP" in the last 168 hours. Lipid Profile: No results for input(s): "CHOL", "HDL", "LDLCALC", "TRIG", "CHOLHDL", "LDLDIRECT" in the last 72 hours. Thyroid Function Tests: No results for input(s): "TSH", "T4TOTAL", "FREET4", "T3FREE", "THYROIDAB" in the last 72 hours.  Anemia Panel: No results for input(s): "VITAMINB12", "FOLATE", "FERRITIN", "TIBC", "IRON", "RETICCTPCT" in the last 72 hours. Sepsis Labs: No results for input(s): "PROCALCITON", "LATICACIDVEN" in the last 168 hours.  Recent Results (from the past 240 hour(s))  Culture, blood (routine x 2)     Status: None (Preliminary result)   Collection Time: 09/05/22  6:20 PM   Specimen: BLOOD  Result Value Ref Range Status   Specimen Description   Final    BLOOD LEFT ANTECUBITAL Performed at [facility], [address] [place], [place] [address]    Special Requests   Final    BOTTLES DRAWN AEROBIC AND ANAEROBIC Blood Culture adequate volume Performed at [facility], [address], [place], [place] [address]    Culture   Final    NO GROWTH 4 DAYS Performed at [facility], [address], [place], [place] [address]    Report Status PENDING  Incomplete  Culture, blood (routine x 2)     Status: None (Preliminary result)   Collection Time: 09/05/22 10:45 PM   Specimen: BLOOD  Result Value Ref Range Status   Specimen Description   Final    BLOOD RIGHT ANTECUBITAL Performed at [facility], [address], [place], [place] [address]  Special Requests   Final    BOTTLES DRAWN AEROBIC AND ANAEROBIC Blood Culture adequate volume Performed at [facility], [address], [place], [place] [address]    Culture   Final    NO GROWTH 4 DAYS Performed at [facility], [address], [place], [place] [address]    Report Status PENDING  Incomplete  Respiratory (~20 pathogens) panel by PCR     Status: None   Collection Time: 09/06/22 11:47 AM   Specimen: Nasopharyngeal Swab; Respiratory  Result Value Ref Range Status   Adenovirus NOT DETECTED NOT DETECTED Final   Coronavirus 229E NOT DETECTED NOT DETECTED Final    Comment: (NOTE) The Coronavirus on the Respiratory Panel, DOES NOT test for the novel  Coronavirus (2019 nCoV)    Coronavirus HKU1 NOT DETECTED NOT DETECTED Final   Coronavirus NL63 NOT DETECTED NOT DETECTED Final   Coronavirus OC43 NOT DETECTED NOT DETECTED Final   Metapneumovirus NOT DETECTED NOT DETECTED Final   Rhinovirus / Enterovirus NOT DETECTED NOT DETECTED Final   Influenza A NOT DETECTED NOT DETECTED Final   Influenza B NOT DETECTED NOT DETECTED Final   Parainfluenza Virus 1 NOT DETECTED NOT DETECTED Final   Parainfluenza Virus 2 NOT DETECTED NOT DETECTED Final    Parainfluenza Virus 3 NOT DETECTED NOT DETECTED Final   Parainfluenza Virus 4 NOT DETECTED NOT DETECTED Final   Respiratory Syncytial Virus NOT DETECTED NOT DETECTED Final   Bordetella pertussis NOT DETECTED NOT DETECTED Final   Bordetella Parapertussis NOT DETECTED NOT DETECTED Final   Chlamydophila pneumoniae NOT DETECTED NOT DETECTED Final   Mycoplasma pneumoniae NOT DETECTED NOT DETECTED Final    Comment: Performed at [facility], [address] [place], [place] [address]  Expectorated Sputum Assessment w Gram Stain, Rflx to Resp Cult     Status: None   Collection Time: 09/06/22  1:34 PM   Specimen: Sputum  Result Value Ref Range Status   Specimen Description EXPECTORATED SPUTUM  Final   Special Requests NONE  Final   Sputum evaluation   Final    Sputum specimen not acceptable for testing.  Please recollect.   Performed at [facility], [address] [place], [place] [address]    Report Status 09/06/2022 FINAL  Final  MRSA Next Gen by PCR, Nasal     Status: None   Collection Time: 09/06/22  4:30 PM   Specimen: Nasal Mucosa; Nasal Swab  Result Value Ref Range Status   MRSA by PCR Next Gen NOT DETECTED NOT DETECTED Final    Comment: (NOTE) The GeneXpert MRSA Assay (FDA approved for NASAL specimens only), is one component of a comprehensive MRSA colonization surveillance program. It is not intended to diagnose MRSA infection nor to guide or monitor treatment for MRSA infections. Test performance is not FDA approved in patients less than 13 years old. Performed at [facility], [address], [place], [place] [address]   Expectorated Sputum Assessment w Gram Stain, Rflx to Resp Cult     Status: None   Collection Time: 09/09/22  8:21 AM   Specimen: Expectorated Sputum  Result Value Ref Range Status   Specimen Description EXPECTORATED SPUTUM  Final   Special Requests NONE  Final   Sputum evaluation   Final    THIS SPECIMEN IS  ACCEPTABLE FOR SPUTUM CULTURE Performed at [facility], [address], [place], [place] [address]    Report Status 09/09/2022 FINAL  Final  Culture, Respiratory w Gram Stain     Status: None (Preliminary result)   Collection Time: 09/09/22  8:21 AM  Result Value Ref Range Status   Specimen Description   Final    EXPECTORATED SPUTUM Performed at [facility], [address], [place], [place] [address]    Special Requests   Final    NONE Reflexed from [id number] Performed at [facility], [address] [place], [place] [address]    Gram Stain NO WBC SEEN RARE BUDDING YEAST SEEN   Final   Culture   Final    CULTURE REINCUBATED FOR BETTER GROWTH Performed at [facility], [address] [place], [place] [address]    Report Status PENDING  Incomplete         Radiology Studies: CT MAXILLOFACIAL W CONTRAST  Result Date: 09/08/2022 CLINICAL DATA:  h/o dental pain and now with mulifocal pneumonia EXAM: CT MAXILLOFACIAL WITH CONTRAST TECHNIQUE: Multidetector CT imaging of the maxillofacial structures was performed with intravenous contrast. Multiplanar CT image reconstructions were also generated. RADIATION DOSE REDUCTION: This exam was performed according to the departmental dose-optimization program which includes automated exposure control, adjustment of the mA and/or kV according to patient size and/or use of iterative reconstruction technique. CONTRAST:  OMNIPAQUE IOHEXOL 300 MG/ML  SOLN COMPARISON:  None Available. FINDINGS: Osseous: No fracture or mandibular dislocation. No destructive process. Orbits: Bilateral lens replacement. No traumatic or inflammatory finding. Sinuses: Clear. Soft tissues: Odontogenic disease with multiple large dental caries there is no evidence of an odontogenic soft tissue abscess. Limited intracranial: No significant or unexpected finding. IMPRESSION: Odontogenic disease with multiple large  dental caries. No evidence of an odontogenic soft tissue abscess. Electronically Signed   By: Lorenza Cambridge M.D.   On: 09/08/2022 18:44        Scheduled Meds:  aspirin EC  81 mg Oral Daily   diclofenac Sodium  2 g Topical QID   enoxaparin (LOVENOX) injection  40 mg Subcutaneous Q24H   feeding supplement  237 mL Oral BID BM   guaiFENesin  1,200 mg Oral BID   pantoprazole  40 mg Oral Daily   saccharomyces boulardii  250 mg Oral BID   Continuous Infusions:  sodium chloride 75 mL/hr at 09/10/22 1242   piperacillin-tazobactam (ZOSYN)  IV 3.375 g (09/10/22 1241)     LOS: 3 days    Time spent: 35 minutes    Clova Morlock A Brandonlee Navis, MD [facility]   If 7PM-7AM, please contact night-coverage [email or web address]  09/10/2022, 2:04 PM

## 2022-09-11 DIAGNOSIS — J189 Pneumonia, unspecified organism: Secondary | ICD-10-CM | POA: Diagnosis not present

## 2022-09-11 LAB — CBC
HCT: 38.5 % — ABNORMAL LOW (ref 39.0–52.0)
Hemoglobin: 11.9 g/dL — ABNORMAL LOW (ref 13.0–17.0)
MCH: 29.3 pg (ref 26.0–34.0)
MCHC: 30.9 g/dL (ref 30.0–36.0)
MCV: 94.8 fL (ref 80.0–100.0)
Platelets: 334 10*3/uL (ref 150–400)
RBC: 4.06 MIL/uL — ABNORMAL LOW (ref 4.22–5.81)
RDW: 13.2 % (ref 11.5–15.5)
WBC: 12.9 10*3/uL — ABNORMAL HIGH (ref 4.0–10.5)
nRBC: 0 % (ref 0.0–0.2)

## 2022-09-11 LAB — CULTURE, BLOOD (ROUTINE X 2)
Culture: NO GROWTH
Special Requests: ADEQUATE

## 2022-09-11 MED ORDER — SACCHAROMYCES BOULARDII 250 MG PO CAPS: 250.0000 mg | ORAL_CAPSULE | Freq: Two times a day (BID) | ORAL | 0 refills | Status: DC

## 2022-09-11 MED ORDER — DEXTROMETHORPHAN POLISTIREX ER 30 MG/5ML PO SUER: 30.0000 mg | Freq: Two times a day (BID) | ORAL | Status: DC | PRN

## 2022-09-11 MED ORDER — DEXTROMETHORPHAN POLISTIREX ER 30 MG/5ML PO SUER: 30.0000 mg | Freq: Two times a day (BID) | ORAL | 0 refills | Status: DC | PRN

## 2022-09-11 MED ORDER — GUAIFENESIN ER 600 MG PO TB12: 1200.0000 mg | ORAL_TABLET | Freq: Two times a day (BID) | ORAL | 0 refills | Status: AC

## 2022-09-11 MED ORDER — CEFPODOXIME PROXETIL 200 MG PO TABS: 200.0000 mg | ORAL_TABLET | Freq: Two times a day (BID) | ORAL | 0 refills | Status: DC

## 2022-09-11 MED ORDER — ALBUTEROL SULFATE (2.5 MG/3ML) 0.083% IN NEBU: 3.0000 mL | INHALATION_SOLUTION | Freq: Four times a day (QID) | RESPIRATORY_TRACT | Status: DC | PRN

## 2022-09-11 MED ORDER — METRONIDAZOLE 500 MG PO TABS: 500.0000 mg | ORAL_TABLET | Freq: Three times a day (TID) | ORAL | 0 refills | Status: DC

## 2022-09-11 NOTE — Progress Notes (Signed)
Mobility Specialist - Progress Note   09/11/22 1011  Mobility  Activity Ambulated independently in hallway  Level of Assistance Independent  Assistive Device None  Distance Ambulated (ft) 350 ft  Activity Response Tolerated well  Mobility Referral Yes  $Mobility charge 1 Mobility  Mobility Specialist Start Time (ACUTE ONLY) 1001  Mobility Specialist Stop Time (ACUTE ONLY) 1009  Mobility Specialist Time Calculation (min) (ACUTE ONLY) 8 min   Pt received in bed and agreeable to mobility. No complaints during session. Pt to bed after session with all needs met.    During mobility: 90%  SpO2  Chief Technology Officer

## 2022-09-11 NOTE — Discharge Summary (Addendum)
Physician Discharge Summary   Patient: Ryan Mathews MRN: [id number] DOB: [birth year]  Admit date:     09/05/2022  Discharge date: 09/11/22  Discharge Physician: Alba Cory   PCP: [name], MD   Recommendations at discharge:   Close follow up with PCP-- to make sure no worsening PNA>  Needs CT chest in 6 weeks. Dr Judeth Horn.  Needs LFT   Discharge Diagnoses: Principal Problem:   CAP (community acquired pneumonia) Active Problems:   Bullous emphysema (HCC)   S/P MVR (mitral valve repair) 36 mm Simulus semi rigid band) via Heartport 04/29/2021   Medication management   Malnutrition of moderate degree  Resolved Problems:   * No resolved hospital problems. [facility] Course: 77 year old past medical history significant for COPD with emphysema, prior tobacco use quit 15 years ago, mitral valve repair February 2023 with subsequent moderate MR, chronic diastolic heart failure presents with persistent symptoms of pneumonia. Patient completed 10 days of antibiotics recently. He continued to have cough night sweats he has had 20 pound weight loss.   Assessment and Plan: 1-Multifocal pneumonia,  Poor dentition.  Treated with IV zosyn. Completed azithromycin.  He will need close follow up with oral maxillofacial Surgeon for teeth extraction.  Started  Guaifenesin, Flutter Valve. Respiratory toilet.  He will need at least 4 weeks of antibiotics for Treatment of PNA>  Blood culture no growth to date.  ID consulted for home antibiotics recommendations,. Patient is concern Augmentin didn't work last time.  Discussed with Dr Zigmund Daniel, we can use Cefpodoxime and Flagyl if patient does not want to try Augmentin.  Sputum culture only with few yeast. No changes in management per ID>  WBC down to 12.  Stable for discharge.  If no resolution of infiltrate in 6 weeks might need bronchoscopy  COPD, emphysema:  Quit smoking 15 years ago.  PRN nebulizer   Status post MVR  36  mm Simulus semi rigid band) via Heartport 04/29/2021  -TEE performed on 6/19 was negative for Vegetation.    Chronic diastolic heart failure: Continue with lasix and potassium supplement.    Essential hypertension: Continue with lasix,  lisinopril.    Hyperlipidemia: on statins.    Transaminases; mild monitor on statins.    Loose stool; Denies abdominal pain. Continue with Florastore.  Denies abdominal pain.   Arthritis; Started  Voltaren gel. PRN tylenol/        Consultants: ID, Pulmonologist  Procedures performed: None  Disposition: Home Diet recommendation:  Discharge Diet Orders (From admission, onward)     Start     Ordered   09/11/22 0000  Diet - low sodium heart healthy        09/11/22 1119           Cardiac diet DISCHARGE MEDICATION: Allergies as of 09/11/2022   No Known Allergies      Medication List     STOP taking these medications    potassium chloride SA 20 MEQ tablet Commonly known as: Klor-Con M20       TAKE these medications    acetaminophen 650 MG CR tablet Commonly known as: TYLENOL Take 650 mg by mouth 2 (two) times daily.   Ascorbic Acid 500 MG Chew Chew 1 tablet by mouth daily.   aspirin EC 81 MG tablet Take 81 mg by mouth daily. Swallow whole.   atorvastatin 20 MG tablet Commonly known as: LIPITOR TAKE 1 TABLET BY MOUTH EVERY DAY   b complex vitamins tablet Take 1 tablet  by mouth daily.   cefpodoxime 200 MG tablet Commonly known as: VANTIN Take 1 tablet (200 mg total) by mouth 2 (two) times daily.   dextromethorphan 30 MG/5ML liquid Commonly known as: DELSYM Take 5 mLs (30 mg total) by mouth 2 (two) times daily as needed for cough.   Fish Oil 1000 MG Caps Take 1 capsule (1,000 mg total) by mouth daily.   furosemide 20 MG tablet Commonly known as: LASIX Take 1 tablet (20 mg total) by mouth daily.   guaiFENesin 600 MG 12 hr tablet Commonly known as: MUCINEX Take 2 tablets (1,200 mg total) by mouth 2 (two)  times daily for 14 days.   lisinopril 10 MG tablet Commonly known as: ZESTRIL Take 1 tablet (10 mg total) by mouth at bedtime.   metroNIDAZOLE 500 MG tablet Commonly known as: Flagyl Take 1 tablet (500 mg total) by mouth 3 (three) times daily.   Omeprazole-Sodium Bicarbonate 20-1100 MG Caps capsule Commonly known as: Zegerid OTC Take 1 capsule by mouth daily before breakfast. What changed:  when to take this reasons to take this   saccharomyces boulardii 250 MG capsule Commonly known as: FLORASTOR Take 1 capsule (250 mg total) by mouth 2 (two) times daily.        Follow-up Information     [name], MD Follow up in 1 week(s).   Specialty: Family Medicine Contact information: [address] [place] [address] [phone]         Leslye Peer, MD Follow up in 3 week(s).   Specialty: Pulmonary Disease Contact information: [address] [place] [address] [phone]         Odette Fraction, MD Follow up.   Specialty: Infectious Diseases Why: scheduled at [facility] on 7/23 at 4 pm Contact information: [address] [employer] [address] [place] [address] [phone]                Discharge Exam: Ceasar Mons Weights   09/05/22 1809 09/10/22 0900  Weight: 86.2 kg 84.3 kg   General; NAD  Condition at discharge: stable  The results of significant diagnostics from this hospitalization (including imaging, microbiology, ancillary and laboratory) are listed below for reference.   Imaging Studies: CT MAXILLOFACIAL W CONTRAST  Result Date: 09/08/2022 CLINICAL DATA:  h/o dental pain and now with mulifocal pneumonia EXAM: CT MAXILLOFACIAL WITH CONTRAST TECHNIQUE: Multidetector CT imaging of the maxillofacial structures was performed with intravenous contrast. Multiplanar CT image reconstructions were also generated. RADIATION DOSE REDUCTION: This exam was performed according to the departmental dose-optimization program which  includes automated exposure control, adjustment of the mA and/or kV according to patient size and/or use of iterative reconstruction technique. CONTRAST:  OMNIPAQUE IOHEXOL 300 MG/ML  SOLN COMPARISON:  None Available. FINDINGS: Osseous: No fracture or mandibular dislocation. No destructive process. Orbits: Bilateral lens replacement. No traumatic or inflammatory finding. Sinuses: Clear. Soft tissues: Odontogenic disease with multiple large dental caries there is no evidence of an odontogenic soft tissue abscess. Limited intracranial: No significant or unexpected finding. IMPRESSION: Odontogenic disease with multiple large dental caries. No evidence of an odontogenic soft tissue abscess. Electronically Signed   By: Lorenza Cambridge M.D.   On: 09/08/2022 18:44   CT Chest W Contrast  Result Date: 09/05/2022 CLINICAL DATA:  Pneumonia, complication suspected. EXAM: CT CHEST WITH CONTRAST TECHNIQUE: Multidetector CT imaging of the chest was performed during intravenous contrast administration. RADIATION DOSE REDUCTION: This exam was performed according to the departmental dose-optimization  program which includes automated exposure control, adjustment of the mA and/or kV according to patient size and/or use of iterative reconstruction technique. CONTRAST:  75mL OMNIPAQUE IOHEXOL 300 MG/ML  SOLN COMPARISON:  Chest radiograph performed earlier on the same date. FINDINGS: Cardiovascular: No significant vascular findings. Normal heart size. Prosthetic mitral valve with streak artifact. No pericardial effusion. Mediastinum/Nodes: Multiple subcentimeter paratracheal vascular sclerosis and subcarinal lymph nodes, likely reactive. No bulky lymphadenopathy. Lungs/Pleura: Severe emphysematous changes of bilateral upper lobes. Large consolidation in the left upper lobe with air bronchograms and small dependent left lung consolidation. There is also small consolidation in the dependent portion of the left upper lobe. These are  likely secondary to multifocal pneumonia. No large pleural effusion or pneumothorax. Upper Abdomen: Small hiatal hernia.  No acute abdominal process. Musculoskeletal: No chest wall abnormality. No acute or significant osseous findings. IMPRESSION: 1. Large consolidation in the left upper lobe with air bronchograms and small dependent left lung consolidation. There is also small consolidation in the dependent portion of the left upper lobe. These are likely secondary to multifocal pneumonia. Follow-up examination to resolution is recommended. 2. Severe emphysematous changes of bilateral upper lobes. 3. Multiple subcentimeter mediastinal lymph nodes, likely reactive. 4. No acute vascular abnormality. Emphysema (ICD10-J43.9). Electronically Signed   By: Larose Hires D.O.   On: 09/05/2022 23:56   DG Chest Port 1 View  Result Date: 09/05/2022 CLINICAL DATA:  Cough. EXAM: PORTABLE CHEST 1 VIEW COMPARISON:  Chest radiograph dated 09/05/2022. FINDINGS: No significant interval change in the bilateral pulmonary opacities compared to the earlier radiograph. No pleural effusion or pneumothorax. The cardiac silhouette is within limits. No acute osseous pathology. IMPRESSION: No significant interval change.  Continued follow-up recommended. Electronically Signed   By: Elgie Collard M.D.   On: 09/05/2022 22:09   DG Chest 2 View  Result Date: 09/05/2022 CLINICAL DATA:  Chronic cough, chills, night sweats, 20 pound weight loss. EXAM: CHEST - 2 VIEW COMPARISON:  Chest x-ray dated Aug 09, 2022. FINDINGS: Normal heart size. Prior mitral valve repair. Worsening patchy consolidation in the left upper lobe with new areas of pleural thickening and retraction of the left hilum. New mild patchy opacity in the superior segment of the right lower lobe in the basilar left lower lobe. No pleural effusion or pneumothorax. No acute osseous abnormality. IMPRESSION: 1. Progressive multifocal pneumonia, significantly worsened in the left  upper lobe pneumonia with new areas of pleural thickening and retraction of the left hilum. Atypical infections should be considered. CT chest with contrast may be helpful for further characterization. Electronically Signed   By: Obie Dredge M.D.   On: 09/05/2022 16:45   ECHO TEE  Result Date: 08/31/2022    TRANSESOPHOGEAL ECHO REPORT   Patient Name:   KIRIL CARPIO Date of Exam: 08/31/2022 Medical Rec #:  [id number]          Height:       69.0 in Accession #:    [id number]         Weight:       190.0 lb Date of Birth:  May 03, 1945           BSA:          2.021 m Patient Age:    77 years           BP:           111/49 mmHg Patient Gender: M  HR:           80 bpm. Exam Location:  Inpatient Procedure: Transesophageal Echo, 3D Echo, Color Doppler, Cardiac Doppler and            Saline Contrast Bubble Study Indications:     Mitral Regurgitation i34.0  History:         Patient has prior history of Echocardiogram examinations, most                  recent 06/09/2022. CHF, COPD; Risk Factors:Hypertension and                  Dyslipidemia. Mitral Valve Repair 04/29/21 with 36mm Simulus                  Semi-rigid band.  Sonographer:     Irving Burton Senior RDCS Referring Phys:  Yates Decamp MD Diagnosing Phys: Tessa Lerner DO PROCEDURE: After discussion of the risks and benefits of a TEE, an informed consent was obtained from the patient. The transesophogeal probe was passed without difficulty through the esophogus of the patient. Sedation performed by different physician. The patient was monitored while under deep sedation. Anesthestetic sedation was provided intravenously by Anesthesiology: 382mg  of Propofol. Supplementary images were obtained from transthoracic windows as indicated to answer the clinical question. The patient developed no complications during the procedure.  IMPRESSIONS  1. Left ventricular ejection fraction, by estimation, is 60 to 65%. The left ventricle has normal function. The left  ventricle has no regional wall motion abnormalities. Left ventricular diastolic function could not be evaluated.  2. Right ventricular systolic function is normal. The right ventricular size is normal.  3. No left atrial/left atrial appendage thrombus was detected. The LAA emptying velocity was 46 cm/s.  4. A small pericardial effusion is present. The pericardial effusion is anterior to the right ventricle. There is no evidence of cardiac tamponade.  5. S/p mitral valve repair with 36mm Simulus semi rigid band. Well seated, leaflet mobility normal, moderate mitral regurgitation (multiple jets, resultant jet is eccentric along the anterior mitral leaflet) (PISA radius 0.83cm, aliasing velocity 34.6cm/sec, MR peak velocity 437cm/sec, MR VTI 108cm, ERO 0.3cm2, regurgitant volume 101mL/beat), no mitral stenosis (MG , HR 77bpm, BP 110/75), pulmonary vein flow is blunted. No evidence of mitral valve vegetation.  6. The aortic valve is normal in structure. Aortic valve regurgitation is not visualized. No aortic stenosis is present.  7. There is mild (Grade II) layered plaque involving the descending aorta.  8. Agitated saline contrast bubble study was negative, with no evidence of any interatrial shunt. Agitated saline contrast bubble study is suggestive of shunting observed after >6 cardiac cycles suggestive of intrapulmonary shunting.  9. Rhythm strip during this exam demonstrates normal sinus rhythm. Conclusion(s)/Recommendation(s): No evidence of vegetation/infective endocarditis on this transesophageael echocardiogram. FINDINGS  Left Ventricle: Left ventricular ejection fraction, by estimation, is 60 to 65%. The left ventricle has normal function. The left ventricle has no regional wall motion abnormalities. The left ventricular internal cavity size was normal in size. There is  no left ventricular hypertrophy. Left ventricular diastolic function could not be evaluated due to mitral valve repair. Left ventricular  diastolic function could not be evaluated. Right Ventricle: The right ventricular size is normal. No increase in right ventricular wall thickness. Right ventricular systolic function is normal. Left Atrium: Left atrial size was normal in size. No left atrial/left atrial appendage thrombus was detected. The LAA emptying velocity was 46 cm/s. Right Atrium: Right atrial  size was normal in size. Pericardium: A small pericardial effusion is present. The pericardial effusion is anterior to the right ventricle. There is no evidence of cardiac tamponade. Mitral Valve: S/p mitral valve repair with 36mm Simulus semi rigid band. Well seated, leaflet mobility normal, moderate mitral regurgitation (multiple jets, resultant jet is eccentric along the anterior mitral leaflet) (PISA radius 0.83cm, aliasing velocity 34.6cm/sec, MR peak velocity 437cm/sec, MR VTI 108cm, ERO 0.3cm2, regurgitant volume 3mL/beat), no mitral stenosis (MG , HR 77bpm, BP 110/75), pulmonary vein flow is blunted. No evidence of mitral valve vegetation. MV peak gradient, 8.2 mmHg. The mean mitral valve gradient is 4.0 mmHg. Tricuspid Valve: The tricuspid valve is normal in structure. Tricuspid valve regurgitation is mild . No evidence of tricuspid stenosis. There is no evidence of tricuspid valve vegetation. Aortic Valve: The aortic valve is normal in structure. Aortic valve regurgitation is not visualized. No aortic stenosis is present. There is no evidence of aortic valve vegetation. Pulmonic Valve: The pulmonic valve was normal in structure. Pulmonic valve regurgitation is not visualized. No evidence of pulmonic stenosis. There is no evidence of pulmonic valve vegetation. Aorta: The aortic root and ascending aorta are structurally normal, with no evidence of dilitation. There is mild (Grade II) layered plaque involving the descending aorta. Venous: The right upper pulmonary vein and right lower pulmonary vein are normal. A systolic blunting flow  pattern is recorded from the left upper pulmonary vein. IAS/Shunts: The interatrial septum appears to be lipomatous. The atrial septum is grossly normal. Agitated saline contrast was given intravenously to evaluate for intracardiac shunting. Agitated saline contrast bubble study was negative, with no evidence  of any interatrial shunt. Agitated saline contrast bubble study is suggestive of shunting observed after >6 cardiac cycles suggestive of intrapulmonary shunting. EKG: Rhythm strip during this exam demonstrates normal sinus rhythm. Additional Comments: Spectral Doppler performed. LEFT VENTRICLE PLAX 2D LVOT diam:     2.40 cm LV SV:         73 LV SV Index:   36 LVOT Area:     4.52 cm  LEFT ATRIUM             Index LA Vol (A2C):   62.3 ml 30.82 ml/m LA Vol (A4C):   46.5 ml 23.00 ml/m LA Biplane Vol: 55.5 ml 27.46 ml/m  AORTIC VALVE LVOT Vmax:   89.80 cm/s LVOT Vmean:  65.450 cm/s LVOT VTI:    0.161 m  AORTA Ao Root diam: 3.30 cm Ao Asc diam:  3.10 cm MITRAL VALVE                  TRICUSPID VALVE MV Area (PHT): 3.39 cm       TR Peak grad:   12.1 mmHg MV Area VTI:   0.67 cm       TR Vmax:        174.00 cm/s MV Peak grad:  8.2 mmHg MV Mean grad:  4.0 mmHg       SHUNTS MV Vmax:       1.43 m/s       Systemic VTI:  0.16 m MV Vmean:      89.3 cm/s      Systemic Diam: 2.40 cm MV VTI:        1.08 m MV Decel Time: 224 msec MR Peak grad:    76.4 mmHg MR Mean grad:    51.0 mmHg MR Vmax:         437.00 cm/s MR Vmean:  335.0 cm/s MR PISA Nyquist: 0.3 m/s MR PISA:         4.36 cm MR PISA Eff ROA: 35 mm MR PISA Radius:  0.83 cm MV E velocity: 101.00 cm/s MV A velocity: 126.00 cm/s MV E/A ratio:  0.80 Sunit Tolia DO Electronically signed by Tessa Lerner DO Signature Date/Time: 08/31/2022/10:08:41 PM    Final    EP STUDY  Result Date: 08/31/2022 See surgical note for result.   Microbiology: Results for orders placed or performed during the hospital encounter of 09/05/22  Culture, blood (routine x 2)     Status:  None   Collection Time: 09/05/22  6:20 PM   Specimen: BLOOD  Result Value Ref Range Status   Specimen Description   Final    BLOOD LEFT ANTECUBITAL Performed at [facility], [address], [place], [place] [address]    Special Requests   Final    BOTTLES DRAWN AEROBIC AND ANAEROBIC Blood Culture adequate volume Performed at [facility], [address], [place], [place] [address]    Culture   Final    NO GROWTH 5 DAYS Performed at [facility], [address] [place], [place] [address]    Report Status 09/11/2022 FINAL  Final  Culture, blood (routine x 2)     Status: None   Collection Time: 09/05/22 10:45 PM   Specimen: BLOOD  Result Value Ref Range Status   Specimen Description   Final    BLOOD RIGHT ANTECUBITAL Performed at [facility], [address] [place], [place] [address]    Special Requests   Final    BOTTLES DRAWN AEROBIC AND ANAEROBIC Blood Culture adequate volume Performed at [facility], [address] [place], [place] [address]    Culture   Final    NO GROWTH 5 DAYS Performed at [facility], [address], [place], [place] [address]    Report Status 09/11/2022 FINAL  Final  Respiratory (~20 pathogens) panel by PCR     Status: None   Collection Time: 09/06/22 11:47 AM   Specimen: Nasopharyngeal Swab; Respiratory  Result Value Ref Range Status   Adenovirus NOT DETECTED NOT DETECTED Final   Coronavirus 229E NOT DETECTED NOT DETECTED Final    Comment: (NOTE) The Coronavirus on the Respiratory Panel, DOES NOT test for the novel  Coronavirus (2019 nCoV)    Coronavirus HKU1 NOT DETECTED NOT DETECTED Final   Coronavirus NL63 NOT DETECTED NOT DETECTED Final   Coronavirus OC43 NOT DETECTED NOT DETECTED Final   Metapneumovirus NOT DETECTED NOT DETECTED Final   Rhinovirus / Enterovirus NOT DETECTED NOT DETECTED Final   Influenza A NOT DETECTED NOT DETECTED Final    Influenza B NOT DETECTED NOT DETECTED Final   Parainfluenza Virus 1 NOT DETECTED NOT DETECTED Final   Parainfluenza Virus 2 NOT DETECTED NOT DETECTED Final   Parainfluenza Virus 3 NOT DETECTED NOT DETECTED Final   Parainfluenza Virus 4 NOT DETECTED NOT DETECTED Final   Respiratory Syncytial Virus NOT DETECTED NOT DETECTED Final   Bordetella pertussis NOT DETECTED NOT DETECTED Final   Bordetella Parapertussis NOT DETECTED NOT DETECTED Final   Chlamydophila pneumoniae NOT DETECTED NOT DETECTED Final   Mycoplasma pneumoniae NOT DETECTED NOT DETECTED Final    Comment: Performed at [facility], [address], [place], [place] [address]  Expectorated Sputum Assessment w Gram Stain, Rflx to Resp Cult     Status: None   Collection Time: 09/06/22  1:34  PM   Specimen: Sputum  Result Value Ref Range Status   Specimen Description EXPECTORATED SPUTUM  Final   Special Requests NONE  Final   Sputum evaluation   Final    Sputum specimen not acceptable for testing.  Please recollect.   Performed at [facility], [address], [place], [place] [address]    Report Status 09/06/2022 FINAL  Final  MRSA Next Gen by PCR, Nasal     Status: None   Collection Time: 09/06/22  4:30 PM   Specimen: Nasal Mucosa; Nasal Swab  Result Value Ref Range Status   MRSA by PCR Next Gen NOT DETECTED NOT DETECTED Final    Comment: (NOTE) The GeneXpert MRSA Assay (FDA approved for NASAL specimens only), is one component of a comprehensive MRSA colonization surveillance program. It is not intended to diagnose MRSA infection nor to guide or monitor treatment for MRSA infections. Test performance is not FDA approved in patients less than 12 years old. Performed at [facility], [address], [place], [place] [address]   Expectorated Sputum Assessment w Gram Stain, Rflx to Resp Cult     Status: None   Collection Time: 09/09/22  8:21 AM   Specimen: Expectorated Sputum  Result  Value Ref Range Status   Specimen Description EXPECTORATED SPUTUM  Final   Special Requests NONE  Final   Sputum evaluation   Final    THIS SPECIMEN IS ACCEPTABLE FOR SPUTUM CULTURE Performed at [facility], [address] [place], [place] [address]    Report Status 09/09/2022 FINAL  Final  Culture, Respiratory w Gram Stain     Status: None (Preliminary result)   Collection Time: 09/09/22  8:21 AM  Result Value Ref Range Status   Specimen Description   Final    EXPECTORATED SPUTUM Performed at [facility], [address], [place], [place] [address]    Special Requests   Final    NONE Reflexed from [id number] Performed at [facility], [address] [place], [place] [address]    Gram Stain NO WBC SEEN RARE BUDDING YEAST SEEN   Final   Culture   Final    CULTURE REINCUBATED FOR BETTER GROWTH Performed at [facility], [address] [place], [place] [address]    Report Status PENDING  Incomplete    Labs: CBC: Recent Labs  Lab 09/05/22 1627 09/05/22 1818 09/07/22 0349 09/08/22 0400 09/09/22 0613 09/10/22 0425 09/11/22 0342  WBC 12.7*   < > 12.2* 12.7* 12.9* 13.1* 12.9*  NEUTROABS 8.0*  --   --   --   --   --   --   HGB 13.5   < > 12.9* 12.5* 13.6 13.1 11.9*  HCT 41.2   < > 40.1 38.8* 40.8 40.4 38.5*  MCV 93.7   < > 95.0 95.1 93.4 94.2 94.8  PLT 404.0*   < > 330 317 363 354 334   < > = values in this interval not displayed.   Basic Metabolic Panel: Recent Labs  Lab 09/05/22 1818 09/07/22 0349 09/08/22 0400 09/09/22 0613 09/10/22 0425  NA 134* 135 133* 135 134*  K 4.3 3.9 4.0 4.1 4.0  CL 101 104 101 102 100  CO2 22 22 24 22 26   GLUCOSE 101* 103* 106* 113* 111*  BUN 31* 21 22 20 21   CREATININE 1.24 1.01 1.20 1.11 1.19  CALCIUM 9.5 8.5* 8.2* 8.7* 8.5*  MG  --  2.0  --   --   --    Liver Function Tests: Recent Labs  Lab 09/05/22 1627 09/07/22 0349 09/08/22 0400  AST 57* 54* 55*  ALT 48  54* 56*  ALKPHOS 85 72 74  BILITOT 0.5 0.6 0.7  PROT 7.4 7.2 7.2  ALBUMIN 3.3* 2.9* 2.7*   CBG: No results for input(s): "GLUCAP" in the last 168 hours.  Discharge time spent: greater than 30 minutes.  Signed: Alba Cory, MD [facility] 09/11/2022

## 2022-09-11 NOTE — Plan of Care (Signed)

## 2022-09-11 NOTE — Progress Notes (Signed)
Pt discharged home with no needs. Pt is alert and oriented. AVS was given and explained to patient, patient's wife, and patient's daughter. Patient and family were given time to ask questions. Patient IV was discontinued. Patient belongings were packed and sent home with the patient. Patient daughter at the bedside to transport patient home. Patient escorted to main entrance via wheelchair by RN.

## 2022-09-12 ENCOUNTER — Telehealth: Payer: Self-pay | Admitting: *Deleted

## 2022-09-12 NOTE — Transitions of Care (Post Inpatient/ED Visit) (Signed)
09/12/2022  Name: Ryan Mathews MRN: [id number] DOB: 06/03/1945  Today's TOC FU Call Status: Today's TOC FU Call Status:: Successful TOC FU Call Competed TOC FU Call Complete Date: 09/12/22  Transition Care Management Follow-up Telephone Call Date of Discharge: 09/11/22 Discharge Facility: [facility]) Type of Discharge: Inpatient Admission Primary Inpatient Discharge Diagnosis:: community acquired pneumonia How have you been since you were released from the hospital?: Better Any questions or concerns?: No  Items Reviewed: Did you receive and understand the discharge instructions provided?: Yes Medications obtained,verified, and reconciled?: Yes (Medications Reviewed) Any new allergies since your discharge?: No Dietary orders reviewed?: No Do you have support at home?: Yes People in Home: spouse Name of Support/Comfort Primary Source: Madeline  Medications Reviewed Today: Medications Reviewed Today     Reviewed by Luella Cook, RN (Case Manager) on 09/12/22 at 1534  Med List Status: <None>   Medication Order Taking? Sig Documenting Provider Last Dose Status Informant  acetaminophen (TYLENOL) 650 MG CR tablet [id number] Yes Take 650 mg by mouth 2 (two) times daily. [provider] Taking Active Self, Pharmacy Records  Ascorbic Acid 500 MG CHEW [id number] Yes Chew 1 tablet by mouth daily. [provider] Taking Active Self, Pharmacy Records  aspirin EC 81 MG tablet [id number] Yes Take 81 mg by mouth daily. Swallow whole. [provider] Taking Active Self, Pharmacy Records  atorvastatin (LIPITOR) 20 MG tablet [id number] Yes TAKE 1 TABLET BY MOUTH EVERY DAY Custovic, Rozell Searing, DO Taking Active Self, Pharmacy Records  b complex vitamins tablet [id number] Yes Take 1 tablet by mouth daily. [provider] Taking Active Self, Pharmacy Records  cefpodoxime (VANTIN) 200 MG tablet [id number] Yes Take 1 tablet (200 mg total) by mouth 2 (two) times  daily. Regalado, Prentiss Bells, MD Taking Active   dextromethorphan (DELSYM) 30 MG/5ML liquid [id number] Yes Take 5 mLs (30 mg total) by mouth 2 (two) times daily as needed for cough. Alba Cory, MD Taking Active            Med Note Jonetta Speak Sep 12, 2022  3:34 PM) Pharmacist gave pt delsym and mucinex together  furosemide (LASIX) 20 MG tablet [id number] Yes Take 1 tablet (20 mg total) by mouth daily. Yates Decamp, MD Taking Active Self, Pharmacy Records  guaiFENesin [facility]) 600 MG 12 hr tablet [id number] Yes Take 2 tablets (1,200 mg total) by mouth 2 (two) times daily for 14 days. Regalado, Belkys A, MD Taking Active   lisinopril (ZESTRIL) 10 MG tablet [id number]  Take 1 tablet (10 mg total) by mouth at bedtime. Clotilde Dieter, DO  Expired 09/06/22 2359 Self, Pharmacy Records  metroNIDAZOLE (FLAGYL) 500 MG tablet [id number] Yes Take 1 tablet (500 mg total) by mouth 3 (three) times daily. Regalado, Prentiss Bells, MD Taking Active   Omega-3 Fatty Acids (FISH OIL) 1000 MG CAPS [id number] Yes Take 1 capsule (1,000 mg total) by mouth daily. [name], MD Taking Active Self, Pharmacy Records  Omeprazole-Sodium Bicarbonate (ZEGERID OTC) 20-1100 MG CAPS capsule [id number] Yes Take 1 capsule by mouth daily before breakfast.  Patient taking differently: Take 1 capsule by mouth as needed (Acid reflux).   [name], MD Taking Active Self, Pharmacy Records  saccharomyces boulardii [facility]) 250 MG capsule [id number] Yes Take 1 capsule (250 mg total) by mouth 2 (two) times daily. Alba Cory, MD Taking Active             Home Care and Equipment/Supplies: Were Home Health  Services Ordered?: NA Any new equipment or medical supplies ordered?: NA  Functional Questionnaire: Do you need assistance with bathing/showering or dressing?: No Do you need assistance with meal preparation?: Yes Do you need assistance with eating?: No Do you have difficulty maintaining continence:  No Do you need assistance with getting out of bed/getting out of a chair/moving?: No Do you have difficulty managing or taking your medications?: No  Follow up appointments reviewed: PCP Follow-up appointment confirmed?: Yes Date of PCP follow-up appointment?: 09/14/22 Follow-up Provider: Dr Sharen Hones [facility] Follow-up appointment confirmed?: Yes Date of Specialist follow-up appointment?: 09/22/22 Follow-Up Specialty Provider:: [id number] Cardiology, [id number] infectous disease, patient will call pulmonary for appt in 3 weeks Do you need transportation to your follow-up appointment?: No Do you understand care options if your condition(s) worsen?: Yes-patient verbalized understanding  SDOH Interventions Today    Flowsheet Row Most Recent Value  SDOH Interventions   Food Insecurity Interventions Intervention Not Indicated  Housing Interventions Intervention Not Indicated  Transportation Interventions Intervention Not Indicated      Interventions Today    Flowsheet Row Most Recent Value  General Interventions   General Interventions Discussed/Reviewed General Interventions Discussed, General Interventions Reviewed, Doctor Visits  Doctor Visits Discussed/Reviewed Doctor Visits Reviewed, Doctor Visits Discussed  Pharmacy Interventions   Pharmacy Dicussed/Reviewed Pharmacy Topics Discussed, Pharmacy Topics Reviewed      TOC Interventions Today    Flowsheet Row Most Recent Value  TOC Interventions   TOC Interventions Discussed/Reviewed TOC Interventions Discussed, TOC Interventions Reviewed, Arranged PCP follow up within 7 days/Care Guide scheduled       Gean Maidens BSN RN [facility] [phone]

## 2022-09-13 NOTE — Telephone Encounter (Signed)
Closing encounter. HFU has been scheduled. NFN

## 2022-09-14 ENCOUNTER — Ambulatory Visit: Payer: PPO

## 2022-09-14 ENCOUNTER — Telehealth: Payer: Self-pay

## 2022-09-14 ENCOUNTER — Encounter: Payer: Self-pay | Admitting: Family Medicine

## 2022-09-14 ENCOUNTER — Ambulatory Visit (INDEPENDENT_AMBULATORY_CARE_PROVIDER_SITE_OTHER): Payer: PPO | Admitting: Family Medicine

## 2022-09-14 VITALS — BP 132/76 | HR 100 | Temp 97.4°F | Ht 69.0 in | Wt 191.0 lb

## 2022-09-14 DIAGNOSIS — D729 Disorder of white blood cells, unspecified: Secondary | ICD-10-CM | POA: Diagnosis not present

## 2022-09-14 DIAGNOSIS — Z9889 Other specified postprocedural states: Secondary | ICD-10-CM | POA: Diagnosis not present

## 2022-09-14 DIAGNOSIS — I5032 Chronic diastolic (congestive) heart failure: Secondary | ICD-10-CM

## 2022-09-14 DIAGNOSIS — R0609 Other forms of dyspnea: Secondary | ICD-10-CM | POA: Diagnosis not present

## 2022-09-14 DIAGNOSIS — J439 Emphysema, unspecified: Secondary | ICD-10-CM | POA: Diagnosis not present

## 2022-09-14 DIAGNOSIS — J189 Pneumonia, unspecified organism: Secondary | ICD-10-CM | POA: Diagnosis not present

## 2022-09-14 DIAGNOSIS — D72829 Elevated white blood cell count, unspecified: Secondary | ICD-10-CM

## 2022-09-14 DIAGNOSIS — E663 Overweight: Secondary | ICD-10-CM

## 2022-09-14 LAB — CBC WITH DIFFERENTIAL/PLATELET
Basophils Absolute: 0.2 10*3/uL — ABNORMAL HIGH (ref 0.0–0.1)
Basophils Relative: 0.8 % (ref 0.0–3.0)
Eosinophils Absolute: 1 10*3/uL — ABNORMAL HIGH (ref 0.0–0.7)
Eosinophils Relative: 4.8 % (ref 0.0–5.0)
HCT: 42 % (ref 39.0–52.0)
Hemoglobin: 13.5 g/dL (ref 13.0–17.0)
Lymphocytes Relative: 15.1 % (ref 12.0–46.0)
Lymphs Abs: 3.1 10*3/uL (ref 0.7–4.0)
MCHC: 32.2 g/dL (ref 30.0–36.0)
MCV: 94.4 fl (ref 78.0–100.0)
Monocytes Absolute: 1.5 10*3/uL — ABNORMAL HIGH (ref 0.1–1.0)
Monocytes Relative: 7.4 % (ref 3.0–12.0)
Neutro Abs: 14.9 10*3/uL — ABNORMAL HIGH (ref 1.4–7.7)
Neutrophils Relative %: 71.9 % (ref 43.0–77.0)
Platelets: 424 10*3/uL — ABNORMAL HIGH (ref 150.0–400.0)
RBC: 4.45 Mil/uL (ref 4.22–5.81)
RDW: 14.3 % (ref 11.5–15.5)
WBC: 20.7 10*3/uL (ref 4.0–10.5)

## 2022-09-14 LAB — COMPREHENSIVE METABOLIC PANEL
ALT: 27 U/L (ref 0–53)
AST: 31 U/L (ref 0–37)
Albumin: 3.6 g/dL (ref 3.5–5.2)
Alkaline Phosphatase: 75 U/L (ref 39–117)
BUN: 25 mg/dL — ABNORMAL HIGH (ref 6–23)
CO2: 24 mEq/L (ref 19–32)
Calcium: 9.5 mg/dL (ref 8.4–10.5)
Chloride: 102 mEq/L (ref 96–112)
Creatinine, Ser: 1.07 mg/dL (ref 0.40–1.50)
GFR: 67.89 mL/min (ref >60.00)
Glucose, Bld: 94 mg/dL (ref 70–99)
Potassium: 4.5 mEq/L (ref 3.5–5.1)
Sodium: 134 mEq/L — ABNORMAL LOW (ref 135–145)
Total Bilirubin: 0.6 mg/dL (ref 0.2–1.2)
Total Protein: 7.7 g/dL (ref 6.0–8.3)

## 2022-09-14 MED ORDER — UMECLIDINIUM BROMIDE 62.5 MCG/ACT IN AEPB: 1.0000 | INHALATION_SPRAY | Freq: Every day | RESPIRATORY_TRACT | 3 refills | Status: DC

## 2022-09-14 MED ORDER — STIOLTO RESPIMAT 2.5-2.5 MCG/ACT IN AERS: 2.0000 | INHALATION_SPRAY | Freq: Every day | RESPIRATORY_TRACT | 3 refills | Status: DC

## 2022-09-14 NOTE — Telephone Encounter (Signed)
Discussed results with patient.  Will send off periph smear

## 2022-09-14 NOTE — Progress Notes (Unsigned)
Ph: [phone] Fax: [phone]   Patient ID: LYSLE SMOLINSKI, male    DOB: [birth year], 77 y.o.   MRN: [id number]  This visit was conducted in person.  BP 132/76   Pulse 100   Temp (!) 97.4 F (36.3 C) (Temporal)   Ht 5\' 9"  (1.753 m)   Wt 191 lb (86.6 kg)   SpO2 96%   BMI 28.21 kg/m    CC: hosp f/u visit  Subjective:   HPI: SHI BAYERL is a 77 y.o. male presenting on 09/14/2022 for Hospitalization Follow-up (Admitted on 09/05/22 at [facility], dx multifocal PNA. Pt accompanied by wife, [name]. )   See prior note for details.  Recent hospitalization for multifocal pneumonia associated with weight loss and malaise that failed outpatient management. Tested negative for TB. Treated with IV zosyn, azithromycin course. ID consulted - recommended cefpodoxime + flagyl x6 wks as augmentin previously ineffective.  Hospital records reviewed. Med rec performed.  Blood cultures negative. Sptum culture grew few yeast. HIV, strep, legionella testing negative. Negative respiratory panel.  Poor dentition thought contributing - rec f/u with oral surgeon for teeth extraction.  Plan rpt CT 6 wks - and if infiltrates persist then consider bronchoscopy.   H/o mitral valve repair 04/2021 with subsequent mod MR, known [name]. TEE 08/31/2022 negative for vegetation.   Continues struggling with exertional dyspnea most noticeable when he's trying to shower.  Continued low appetite - attributes to change in taste - since initial antibiotic course (zpack, augmentin). He continues ensure supplement twice daily  Notes new imbalance since recent illness.  No recent fevers/chills, dysphagia, no early satiety, abd pain, nausea/vomiting.  He continues mucinex, zegerid OTC and Florastor probiotic as well as delsym cough syrup.  Sleeping in bed with 3 pillows. No leg swelling   Home health not set up.  Other follow up appointments scheduled: cardiology Dr Jacinto Halim 7/11, pulmonology Mosetta Anis NP 7/24, ID Dr  Elinor Parkinson 10/04/2022. Sees oral surgeon 09/19/2022.  ______________________________________________________________________ Hospital admission: 09/05/2022 Hospital discharge: 09/11/2022 TCM f/u phone call:  performed on 09/12/2022  Recommendations at discharge:  Close follow up with PCP-- to make sure no worsening PNA>  Needs CT chest in 6 weeks. Dr Judeth Horn.  Needs LFT   Discharge Diagnoses: Principal Problem:   CAP (community acquired pneumonia) Active Problems:   Bullous emphysema (HCC)   S/P MVR (mitral valve repair) 36 mm Simulus semi rigid band) via Heartport 04/29/2021   Medication management   Malnutrition of moderate degree     Relevant past medical, surgical, family and social history reviewed and updated as indicated. Interim medical history since our last visit reviewed. Allergies and medications reviewed and updated. Outpatient Medications Prior to Visit  Medication Sig Dispense Refill   acetaminophen (TYLENOL) 650 MG CR tablet Take 650 mg by mouth 2 (two) times daily.     Ascorbic Acid 500 MG CHEW Chew 1 tablet by mouth daily.     aspirin EC 81 MG tablet Take 81 mg by mouth daily. Swallow whole.     atorvastatin (LIPITOR) 20 MG tablet TAKE 1 TABLET BY MOUTH EVERY DAY 90 tablet 1   b complex vitamins tablet Take 1 tablet by mouth daily.     cefpodoxime (VANTIN) 200 MG tablet Take 1 tablet (200 mg total) by mouth 2 (two) times daily. 60 tablet 0   dextromethorphan (DELSYM) 30 MG/5ML liquid Take 5 mLs (30 mg total) by mouth 2 (two) times daily as needed for cough. 89 mL 0  furosemide (LASIX) 20 MG tablet Take 1 tablet (20 mg total) by mouth daily. 90 tablet 3   guaiFENesin (MUCINEX) 600 MG 12 hr tablet Take 2 tablets (1,200 mg total) by mouth 2 (two) times daily for 14 days. 56 tablet 0   lisinopril (ZESTRIL) 10 MG tablet Take 1 tablet (10 mg total) by mouth at bedtime. 90 tablet 3   metroNIDAZOLE (FLAGYL) 500 MG tablet Take 1 tablet (500 mg total) by mouth 3 (three) times  daily. 90 tablet 0   Omega-3 Fatty Acids (FISH OIL) 1000 MG CAPS Take 1 capsule (1,000 mg total) by mouth daily.     Omeprazole-Sodium Bicarbonate (ZEGERID OTC) 20-1100 MG CAPS capsule Take 1 capsule by mouth daily before breakfast. (Patient taking differently: Take 1 capsule by mouth as needed (Acid reflux).) 28 each 0   saccharomyces boulardii (FLORASTOR) 250 MG capsule Take 1 capsule (250 mg total) by mouth 2 (two) times daily. 60 capsule 0   No facility-administered medications prior to visit.     Per HPI unless specifically indicated in ROS section below Review of Systems  Objective:  BP 132/76   Pulse 100   Temp (!) 97.4 F (36.3 C) (Temporal)   Ht 5\' 9"  (1.753 m)   Wt 191 lb (86.6 kg)   SpO2 96%   BMI 28.21 kg/m   Wt Readings from Last 3 Encounters:  09/14/22 191 lb (86.6 kg)  09/10/22 185 lb 12.8 oz (84.3 kg)  09/05/22 190 lb 2 oz (86.2 kg)      Physical Exam    Lab Results  Component Value Date   CREATININE 1.19 09/10/2022   BUN 21 09/10/2022   NA 134 (L) 09/10/2022   K 4.0 09/10/2022   CL 100 09/10/2022   CO2 26 09/10/2022    Lab Results  Component Value Date   ALT 56 (H) 09/08/2022   AST 55 (H) 09/08/2022   ALKPHOS 74 09/08/2022   BILITOT 0.7 09/08/2022    Lab Results  Component Value Date   WBC 12.9 (H) 09/11/2022   HGB 11.9 (L) 09/11/2022   HCT 38.5 (L) 09/11/2022   MCV 94.8 09/11/2022   PLT 334 09/11/2022   CT chest with contrast 09/05/2022 IMPRESSION: 1. Large consolidation in the left upper lobe with air bronchograms and small dependent left lung consolidation. There is also small consolidation in the dependent portion of the left upper lobe. These are likely secondary to multifocal pneumonia. Follow-up examination to resolution is recommended. 2. Severe emphysematous changes of bilateral upper lobes. 3. Multiple subcentimeter mediastinal lymph nodes, likely reactive. 4. No acute vascular abnormality. Emphysema (ICD10-J43.9)  CT  maxillofacial with contrast 09/08/2022 IMPRESSION: Odontogenic disease with multiple large dental caries. No evidence of an odontogenic soft tissue abscess.  Assessment & Plan:   Problem List Items Addressed This Visit     Bullous emphysema (HCC)   Relevant Medications   umeclidinium bromide (INCRUSE ELLIPTA) 62.5 MCG/ACT AEPB   Multifocal pneumonia - Primary   Relevant Medications   umeclidinium bromide (INCRUSE ELLIPTA) 62.5 MCG/ACT AEPB   Other Relevant Orders   CBC with Differential/Platelet   Comprehensive metabolic panel     Meds ordered this encounter  Medications   DISCONTD: Tiotropium Bromide-Olodaterol (STIOLTO RESPIMAT) 2.5-2.5 MCG/ACT AERS    Sig: Inhale 2 puffs into the lungs daily.    Dispense:  4 g    Refill:  3   umeclidinium bromide (INCRUSE ELLIPTA) 62.5 MCG/ACT AEPB    Sig: Inhale 1 puff into the  lungs daily.    Dispense:  30 each    Refill:  3    Fill this in place of Stiolto    Orders Placed This Encounter  Procedures   CBC with Differential/Platelet   Comprehensive metabolic panel    Patient Instructions  Walking oxygen test today.  Continue antibiotics.  Labs today Start incruse ellipta daily inhaler for COPD.  Keep upcoming appointments  Follow up plan: No follow-ups on file.  [name], MD

## 2022-09-14 NOTE — Telephone Encounter (Signed)
CRITICAL VALUE STICKER  CRITICAL VALUE: WBC 20.7  RECEIVER (on-site recipient of call): Benedict Needy, RN  DATE & TIME NOTIFIED: 09/14/22 @ 4p  MESSENGER (representative from lab): Clydie Braun, lab  MD NOTIFIED: Dr. Sharen Hones  TIME OF NOTIFICATION: 09/14/22 @ 4:05p  RESPONSE:  Plan to follow

## 2022-09-14 NOTE — Patient Instructions (Addendum)
Walking oxygen test today.  Continue antibiotics.  Labs today Start incruse ellipta daily inhaler for COPD.  Keep upcoming appointments

## 2022-09-15 ENCOUNTER — Encounter: Payer: Self-pay | Admitting: Family Medicine

## 2022-09-15 NOTE — Assessment & Plan Note (Signed)
H/o biapical centrilobular and paraseptal emphysema by prior imaging, ex smoker. Ongoing exertional dyspnea.  Will trial controller inhaler incruse ellipta.  Keep pulm appt end of July.

## 2022-09-15 NOTE — Assessment & Plan Note (Signed)
Ongoing exertional dyspnea, slowed improvement.  ?COPD related - trial incruse ellipta controller inhaler.

## 2022-09-15 NOTE — Assessment & Plan Note (Signed)
Seems euvolemic.  

## 2022-09-15 NOTE — Assessment & Plan Note (Signed)
Mod MR after valve repair.  This could also contribute to exertional dyspnea.  Keep cards appt next week.

## 2022-09-15 NOTE — Assessment & Plan Note (Addendum)
Slowed recovery but overall improving. No fever/chills, recently weight gain noted, he slowly feels better.  Update CBC, CMP Unclear causative organism - RSV, COVID, legionella, strep pneumo and respiratory panel all negative as well as TB quantiferon gold assay.  Keep pulm f/u at end of July - will need updated CT chest.  ?poor dentition contributing - has upcoming appt with oral surgery.

## 2022-09-15 NOTE — Assessment & Plan Note (Addendum)
Significant weight loss over last few months due to acute respiratory illness with decreased appetite and change noted in taste.  He continues ensure supplement BID.  This seems to be improving - with 6 lb weight gain since hospitalization. Continue to monitor.

## 2022-09-16 ENCOUNTER — Ambulatory Visit: Payer: PPO

## 2022-09-16 LAB — PATHOLOGIST SMEAR REVIEW

## 2022-09-16 NOTE — Telephone Encounter (Signed)
Pt's wife called requesting a call once pt's pathology lab results come back. Call back # [phone]

## 2022-09-16 NOTE — Addendum Note (Signed)
Addended by: [name] on: 09/16/2022 03:36 PM   Modules accepted: Orders

## 2022-09-16 NOTE — Telephone Encounter (Signed)
Lab appt scheduled 09/21/22 @ 3pm.

## 2022-09-16 NOTE — Telephone Encounter (Signed)
Released results via mychart. I want him to recheck labs (nonfasting) next week. Labs ordered.

## 2022-09-21 ENCOUNTER — Ambulatory Visit: Payer: PPO | Admitting: Cardiology

## 2022-09-21 ENCOUNTER — Other Ambulatory Visit (INDEPENDENT_AMBULATORY_CARE_PROVIDER_SITE_OTHER): Payer: PPO

## 2022-09-21 ENCOUNTER — Encounter: Payer: Self-pay | Admitting: Cardiology

## 2022-09-21 VITALS — BP 100/66 | HR 94 | Resp 16 | Ht 69.0 in | Wt 189.4 lb

## 2022-09-21 DIAGNOSIS — Z9889 Other specified postprocedural states: Secondary | ICD-10-CM

## 2022-09-21 DIAGNOSIS — D72829 Elevated white blood cell count, unspecified: Secondary | ICD-10-CM

## 2022-09-21 DIAGNOSIS — I5032 Chronic diastolic (congestive) heart failure: Secondary | ICD-10-CM

## 2022-09-21 DIAGNOSIS — I340 Nonrheumatic mitral (valve) insufficiency: Secondary | ICD-10-CM | POA: Diagnosis not present

## 2022-09-21 DIAGNOSIS — J189 Pneumonia, unspecified organism: Secondary | ICD-10-CM

## 2022-09-21 MED ORDER — FUROSEMIDE 20 MG PO TABS
20.0000 mg | ORAL_TABLET | Freq: Every day | ORAL | 3 refills | Status: DC | PRN
Start: 2022-09-21 — End: 2023-02-06

## 2022-09-21 NOTE — Progress Notes (Addendum)
Primary Physician/Referring:  [name], MD  Patient ID: Ryan Mathews, male    DOB: 1945/04/28, 77 y.o.   MRN: [id number]  Chief Complaint  Patient presents with   Acute heart failure with preserved ejection fraction   Follow-up    HPI:    Ryan Mathews  is a 77 y.o. [place] male patient with former tobacco use history, COPD, hypertension with myxomatous mitral valve and severe MR SP mitral valve repair on 04/29/2021 at [facility] presents to the office for worsening dyspnea.   Seen the patient on 08/29/2022 and felt his acute onset shortness of breath was probably related to severe mitral regurgitation and recommended TEE on urgent basis which he underwent on 08/31/2022.  However due to worsening dyspnea he was admitted to the hospital with multilobar pneumonia involving the left lung on 09/05/2022 and presently on antibiotics.  He now presents for follow-up. He is accompanied by his wife and daughter.  Patient now complains of marked dyspnea even with minimal activities.  No fever or chills.  He is presently on antibiotics.  He has lack of appetite and feels generally weak.  No chest pain or palpitations.  Past Medical History:  Diagnosis Date   Bilateral inguinal hernia (BIH) s/p lap repair 09/06/2012 08/09/2012   Cataract    CMC arthritis, thumb, degenerative    right   COPD (chronic obstructive pulmonary disease) (HCC)    Emphysema of lung (HCC)    Ex-smoker    GERD (gastroesophageal reflux disease)    History of chicken pox    History of pneumonia 08/06/2009        Hyperlipidemia    Hypertension    Nasal septal deviation 04/14/2017   Marked   Numbness    R cheek stays numb   Obesity (BMI 30-39.9) 08/09/2012   S/P MVR (mitral valve repair) 36 mm Simulus semi rigid band) via Heartport 04/29/2021 04/29/2021   S/p minimally invasive mitral valve repair (semirigid 36mm Simulus annuloplasty band) at [facility] (Dr Zebedee Iba) 04/29/2021. Followed locally  by Dr Jacinto Halim. Has been recommended dental ppx.   Echo 05/2022 - normal LVEF 55-60%, mod concentric LVH, G2DD, normal wall motion, mild-mod dilated LA, mod MR with MV annular ring repair with wall impinging MR jet but improved, mild TR (Dr Melton Alar)   Past Surgical History:  Procedure Laterality Date   ANKLE SURGERY Left 1966   with tendon repair with stainless steel wire   CARDIAC CATHETERIZATION     COLONOSCOPY  07/2017   multiple tubular adenomas, rpt 3 yrs (Pyrtle)   INSERTION OF MESH Bilateral 09/06/2012   Procedure: INSERTION OF MESH;  Surgeon: Ardeth Sportsman, MD   LAPAROSCOPIC INGUINAL HERNIA WITH UMBILICAL HERNIA Bilateral 09/06/2012   Procedure: LAPAROSCOPIC exploration and repair of hernias in bellybutton and bilateral groins with mesh;  Surgeon: Ardeth Sportsman, MD   MICRODISCECTOMY LUMBAR Right 08/2020   L4/5 (Dr Maurice Small)   MITRAL VALVE REPAIR  04/29/2021   at [facility] Dr Zebedee Iba   RIGHT/LEFT HEART CATH AND CORONARY ANGIOGRAPHY N/A 02/16/2021   Procedure: RIGHT/LEFT HEART CATH AND CORONARY ANGIOGRAPHY;  Surgeon: Yates Decamp, MD;  Location: [facility] INVASIVE CV LAB;  Service: Cardiovascular;  Laterality: N/A;   TEE WITHOUT CARDIOVERSION N/A 02/12/2021   Procedure: TRANSESOPHAGEAL ECHOCARDIOGRAM (TEE);  Surgeon: Yates Decamp, MD;  Location: [facility] ENDOSCOPY;  Service: Cardiovascular;  Laterality: N/A;   TEE WITHOUT CARDIOVERSION N/A 08/31/2022   Procedure: TRANSESOPHAGEAL ECHOCARDIOGRAM;  Surgeon: Tessa Lerner, DO;  Location: [facility] INVASIVE  CV LAB;  Service: Cardiovascular;  Laterality: N/A;   Family History  Problem Relation Age of Onset   Cancer Mother        breast   Congenital heart disease Mother    Diabetes Mother    COPD Father    Cancer Sister        lung (minimal smoker)   Colon cancer Neg Hx    Esophageal cancer Neg Hx    Liver cancer Neg Hx    Rectal cancer Neg Hx    Stomach cancer Neg Hx    Pancreatic cancer Neg Hx     Social History   Tobacco Use   Smoking status: Former     Packs/day: 2.00    Years: 40.00    Additional pack years: 0.00    Total pack years: 80.00    Types: Cigarettes    Start date: 03/14/1968    Quit date: 03/14/2006    Years since quitting: 16.5   Smokeless tobacco: Never  Substance Use Topics   Alcohol use: Not Currently    Comment: Occasionally   Marital Status: Married   ROS   Review of Systems  Constitutional: Negative for diaphoresis.  Cardiovascular:  Positive for dyspnea on exertion and orthopnea. Negative for chest pain and leg swelling.  Respiratory:  Positive for cough.     Objective  Blood pressure 100/66, pulse 94, resp. rate 16, height 5\' 9"  (1.753 m), weight 189 lb 6.4 oz (85.9 kg), SpO2 97 %.     09/21/2022    9:59 AM 09/21/2022    9:53 AM 09/14/2022   10:23 AM  Vitals with BMI  Height  5\' 9"  5\' 9"   Weight  189 lbs 6 oz 191 lbs  BMI  27.96 28.19  Systolic 100 94 132  Diastolic 66 61 76  Pulse 94 95 100    Blood pressure 100/66, pulse 94, resp. rate 16, height 5\' 9"  (1.753 m), weight 189 lb 6.4 oz (85.9 kg), SpO2 97 %.  Physical Exam Neck:     Vascular: No carotid bruit or JVD.  Cardiovascular:     Rate and Rhythm: Normal rate and regular rhythm.     Pulses: Normal pulses and intact distal pulses.     Heart sounds: Murmur heard.     Blowing holosystolic murmur is present with a grade of 3/6 at the apex.  Pulmonary:     Effort: Pulmonary effort is normal.     Breath sounds: Examination of the right-lower field reveals rales. Examination of the left-lower field reveals rales. Rales present.  Abdominal:     General: Bowel sounds are normal.     Palpations: Abdomen is soft.  Musculoskeletal:     Right lower leg: Edema (1-2+ chronic) present.     Left lower leg: No edema.    Laboratory examination:   Recent Labs    09/08/22 0400 09/09/22 0613 09/10/22 0425 09/14/22 1134  NA 133* 135 134* 134*  K 4.0 4.1 4.0 4.5  CL 101 102 100 102  CO2 24 22 26 24   GLUCOSE 106* 113* 111* 94  BUN 22 20 21  25*   CREATININE 1.20 1.11 1.19 1.07  CALCIUM 8.2* 8.7* 8.5* 9.5  GFRNONAA >60 >60 >60  --       Latest Ref Rng & Units 09/14/2022   11:34 AM 09/10/2022    4:25 AM 09/09/2022    6:13 AM  CMP  Glucose 70 - 99 mg/dL 94  811  914  BUN 6 - 23 mg/dL 25  21  20    Creatinine 0.40 - 1.50 mg/dL 4.09  8.11  9.14   Sodium 135 - 145 mEq/L 134  134  135   Potassium 3.5 - 5.1 mEq/L 4.5  4.0  4.1   Chloride 96 - 112 mEq/L 102  100  102   CO2 19 - 32 mEq/L 24  26  22    Calcium 8.4 - 10.5 mg/dL 9.5  8.5  8.7   Total Protein 6.0 - 8.3 g/dL 7.7     Total Bilirubin 0.2 - 1.2 mg/dL 0.6     Alkaline Phos 39 - 117 U/L 75     AST 0 - 37 U/L 31     ALT 0 - 53 U/L 27         Latest Ref Rng & Units 09/14/2022   11:34 AM 09/11/2022    3:42 AM 09/10/2022    4:25 AM  CBC  WBC 4.0 - 10.5 K/uL 20.7 Repeated and verified X2.  12.9  13.1   Hemoglobin 13.0 - 17.0 g/dL 78.2  95.6  21.3   Hematocrit 39.0 - 52.0 % 42.0  38.5  40.4   Platelets 150.0 - 400.0 K/uL 424.0  334  354    Lab Results  Component Value Date   CHOL 94 07/07/2021   HDL 35.10 (L) 07/07/2021   LDLCALC 37 07/07/2021   TRIG 114.0 07/07/2021   CHOLHDL 3 07/07/2021    Lab Results  Component Value Date   TSH 3.72 09/05/2022    ProBNP (last 3 results) Recent Labs    08/30/22 1355  PROBNP 301    Radiology:   No results found.  Chest x-ray 01/25/2021: 1. Dramatic change in the appearance of the lungs, which could be indicative of interstitial lung disease, although lung volumes are normal. Accordingly, acute atypical infection is not excluded. Further clinical evaluation is recommended. Consideration for follow-up high-resolution chest CT is suggested if clinically appropriate. 2. Aortic atherosclerosis  Chest x-ray two-view 08/09/2022: 1. Diffuse bilateral interstitial pulmonary opacity, more focal and heterogeneous in the left upper lobe, concerning for multifocal infection. 2. Mitral valve annular prosthesis.  Cardiac Studies:    Right and left heart catheterization 02/16/2021: Normal hemodynamics. LM: Large vessel, mid to distal segment is mildly ectatic. LAD: Large vessel, gives origin to small diagonals, mild disease evident. LCx: Moderate caliber vessel, minimal disease. RI: Moderate caliber vessel.  Minimal ostial disease. RCA: Large-caliber vessel, dominant.  Minimal disease present.  Impression and recommendation: Patient will need mitral valve repair.    Carotid artery duplex  02/09/2021:  The bifurcation, internal, external and common carotid arteries reveal no  evidence of significant stenosis, bilaterally.  Antegrade right vertebral artery flow. Antegrade left vertebral artery  flow.  Mitral valve repair 04/29/2021: By Dr. Zebedee Iba at [facility] MV repair (36 mm Simulus semi rigid band) via Heartport ADULT, VALVULOPLASTY, MITRAL VALVE, VIA HEARTPORT, WITH CARDIOPULMONARY BYPASS; RADICAL RECONSTRUCTION, WITH OR WITHOUT RING. REPLACEMENT MITRAL VALVE VIA HEART PORT N/A 04/29/2021   PCV ECHOCARDIOGRAM COMPLETE 06/09/2022  Narrative Echocardiogram 06/09/2022: Normal LV systolic function with visual EF 55-60%. Left ventricle cavity is normal in size. Moderate concentric hypertrophy of the left ventricle. Normal global wall motion. Doppler evidence of grade II (pseudonormal) diastolic dysfunction, elevated LAP. Calculated EF 56%. Left atrial cavity is mild to moderately dilated at 41.6 ml/m^2. Moderate (Grade II) mitral regurgitation. Mitral valve ring repair of the mitral annulus. Wall-impinging MR jet color flow  area. Structurally normal tricuspid valve.  Mild tricuspid regurgitation. No evidence of pulmonary hypertension. Compared to 10/2021, MR has improved.   TEE 08/31/2022: 1. Left ventricular ejection fraction, by estimation, is 60 to 65%. The left ventricle has normal function. The left ventricle has no regional wall motion abnormalities. Left ventricular diastolic function could  not be evaluated.  2. Right ventricular systolic function is normal. The right ventricular size is normal.  3. No left atrial/left atrial appendage thrombus was detected. The LAA emptying velocity was 46 cm/s.  4. A small pericardial effusion is present. The pericardial effusion is anterior to the right ventricle. There is no evidence of cardiac tamponade.  5. S/p mitral valve repair with 36mm Simulus semi rigid band. Well seated, leaflet mobility normal, moderate mitral regurgitation (multiple jets, resultant jet is eccentric along the anterior mitral leaflet) (PISA radius 0.83cm, aliasing velocity  34.6cm/sec, MR peak velocity 437cm/sec, MR VTI 108cm, ERO 0.3cm2, regurgitant volume 85mL/beat), no mitral stenosis (MG , HR 77bpm, BP 110/75), pulmonary vein flow is blunted. No evidence of mitral valve vegetation.  6. The aortic valve is normal in structure. Aortic valve regurgitation is not visualized. No aortic stenosis is present.  7. There is mild (Grade II) layered plaque involving the descending aorta.  8. Agitated saline contrast bubble study was negative, with no evidence of any interatrial shunt. Agitated saline contrast bubble study is suggestive of shunting observed after >6 cardiac cycles suggestive of intrapulmonary shunting.  9. Rhythm strip during this exam demonstrates normal sinus rhythm.   Conclusion(s)/Recommendation(s): No evidence of vegetation/infective endocarditis on this transesophageael echocardiogram.    EKG:   EKG 08/29/2022: Normal sinus rhythm at rate of 86 bpm, normal axis, incomplete right bundle branch block.  Compared to 05/26/2022, no significant change. .  Allergies   No Known Allergies   Current Outpatient Medications:    acetaminophen (TYLENOL) 650 MG CR tablet, Take 650 mg by mouth 2 (two) times daily., Disp: , Rfl:    Ascorbic Acid 500 MG CHEW, Chew 1 tablet by mouth daily., Disp: , Rfl:    aspirin EC 81 MG tablet, Take 81 mg by mouth daily. Swallow  whole., Disp: , Rfl:    atorvastatin (LIPITOR) 20 MG tablet, TAKE 1 TABLET BY MOUTH EVERY DAY, Disp: 90 tablet, Rfl: 1   b complex vitamins tablet, Take 1 tablet by mouth daily., Disp: , Rfl:    cefpodoxime (VANTIN) 200 MG tablet, Take 1 tablet (200 mg total) by mouth 2 (two) times daily., Disp: 60 tablet, Rfl: 0   dextromethorphan (DELSYM) 30 MG/5ML liquid, Take 5 mLs (30 mg total) by mouth 2 (two) times daily as needed for cough., Disp: 89 mL, Rfl: 0   guaiFENesin (MUCINEX) 600 MG 12 hr tablet, Take 2 tablets (1,200 mg total) by mouth 2 (two) times daily for 14 days., Disp: 56 tablet, Rfl: 0   lisinopril (ZESTRIL) 10 MG tablet, Take 1 tablet (10 mg total) by mouth at bedtime., Disp: 90 tablet, Rfl: 3   metroNIDAZOLE (FLAGYL) 500 MG tablet, Take 1 tablet (500 mg total) by mouth 3 (three) times daily., Disp: 90 tablet, Rfl: 0   Omega-3 Fatty Acids (FISH OIL) 1000 MG CAPS, Take 1 capsule (1,000 mg total) by mouth daily., Disp: , Rfl:    Omeprazole-Sodium Bicarbonate (ZEGERID OTC) 20-1100 MG CAPS capsule, Take 1 capsule by mouth daily before breakfast. (Patient taking differently: Take 1 capsule by mouth as needed (Acid reflux).), Disp: 28 each, Rfl: 0   saccharomyces boulardii (  FLORASTOR) 250 MG capsule, Take 1 capsule (250 mg total) by mouth 2 (two) times daily., Disp: 60 capsule, Rfl: 0   umeclidinium bromide (INCRUSE ELLIPTA) 62.5 MCG/ACT AEPB, Inhale 1 puff into the lungs daily., Disp: 30 each, Rfl: 3   furosemide (LASIX) 20 MG tablet, Take 1 tablet (20 mg total) by mouth daily as needed for fluid or edema (weight gain 3 Lbs in 3 days)., Disp: 90 tablet, Rfl: 3   Assessment     ICD-10-CM   1. S/P MVR (mitral valve repair) 36 mm Simulus semi rigid band) via Heartport 04/29/2021  Z98.890     2. Moderate mitral regurgitation  I34.0     3. Community acquired pneumonia of left upper lobe of lung  J18.9     4. Chronic diastolic heart failure (HCC)  Z61.09 furosemide (LASIX) 20 MG tablet       Medications Discontinued During This Encounter  Medication Reason   furosemide (LASIX) 20 MG tablet Reorder     Meds ordered this encounter  Medications   furosemide (LASIX) 20 MG tablet    Sig: Take 1 tablet (20 mg total) by mouth daily as needed for fluid or edema (weight gain 3 Lbs in 3 days).    Dispense:  90 tablet    Refill:  3   Recommendations:   Ryan Mathews is a 77 y.o. Caucasian male patient with former tobacco use history, COPD, hypertension with myxomatous mitral valve and severe MR SP mitral valve repair on 04/29/2021 at [facility] presents to the office for worsening dyspnea.   Seen the patient on 08/29/2022 and felt his acute onset shortness of breath was probably related to severe mitral regurgitation and recommended TEE on urgent basis which he underwent on 08/31/2022.  However due to worsening dyspnea he was admitted to the hospital with multilobar pneumonia involving the left lung on 09/05/2022 and presently on antibiotics.  He now presents for follow-up.  1. S/P MVR (mitral valve repair) 36 mm Simulus semi rigid band) via Heartport 04/29/2021 Fortunately TEE does not reveal any endocarditis.  He is extremely fatigued and weak and also has marked dyspnea on exertion.  He has moderate mitral regurgitation.  No indication for redo mitral valve repair.  2. Moderate mitral regurgitation As dictated above, he has had moderate residual leak SP mitral valve surgery.  No clinical evidence of heart failure.  3. Community acquired pneumonia of left upper lobe of lung Patient is currently on antibiotic therapy.  I have reassured the patient and his family that his recovery will be taking time as he has severe emphysema on top of this, probably expect marked dyspnea related to partially functioning lung.  Advised him to continue to use incentive spirometer and also to consider steam inhalation at least twice a day to dissolve mucus.  4.  Chronic diastolic  heart failure (HCC) Patient is not in acute decompensated heart failure.  When I did see him last on 08/29/2022, he was in heart failure related to probably combination of pneumonia that was not diagnosed and mitral regurgitation and demand cardiac output increase.  Presently he is doing well, blood pressure is soft, advised him to discontinue furosemide and use it only if there is >3 pound weight gain, worsening dyspnea or leg edema.  I would like to see him back in 3 months for follow-up.  - furosemide (LASIX) 20 MG tablet; Take 1 tablet (20 mg total) by mouth daily as needed for fluid or edema (  weight gain 3 Lbs in 3 days).  Dispense: 90 tablet; Refill: 3  On hindsight, when I last saw him on 08/29/2022, his shortness of breath, cough, diaphoresis, low-grade temperature at night all suggested probably the onset of pneumonia, however I was concerned about endocarditis in view of recent mitral valve surgery and suggestion of severe MR on auscultation.  No make sense that it was related to pneumonia.  Patient was new to me.    Yates Decamp, MD, [facility] 09/21/2022, 10:34 AM Office: [phone] Fax: [phone] Pager: [phone]

## 2022-09-22 ENCOUNTER — Ambulatory Visit: Payer: PPO | Admitting: Cardiology

## 2022-09-22 LAB — CBC WITH DIFFERENTIAL/PLATELET
Basophils Absolute: 0.2 10*3/uL — ABNORMAL HIGH (ref 0.0–0.1)
Basophils Relative: 1.1 % (ref 0.0–3.0)
Eosinophils Absolute: 0.5 10*3/uL (ref 0.0–0.7)
Eosinophils Relative: 3.4 % (ref 0.0–5.0)
HCT: 38.2 % — ABNORMAL LOW (ref 39.0–52.0)
Hemoglobin: 12.7 g/dL — ABNORMAL LOW (ref 13.0–17.0)
Lymphocytes Relative: 14.6 % (ref 12.0–46.0)
Lymphs Abs: 2.1 10*3/uL (ref 0.7–4.0)
MCHC: 33.2 g/dL (ref 30.0–36.0)
MCV: 93 fl (ref 78.0–100.0)
Monocytes Absolute: 0.9 10*3/uL (ref 0.1–1.0)
Monocytes Relative: 6.7 % (ref 3.0–12.0)
Neutro Abs: 10.4 10*3/uL — ABNORMAL HIGH (ref 1.4–7.7)
Neutrophils Relative %: 74.2 % (ref 43.0–77.0)
Platelets: 409 10*3/uL — ABNORMAL HIGH (ref 150.0–400.0)
RBC: 4.1 Mil/uL — ABNORMAL LOW (ref 4.22–5.81)
RDW: 15.2 % (ref 11.5–15.5)
WBC: 14.1 10*3/uL — ABNORMAL HIGH (ref 4.0–10.5)

## 2022-09-22 LAB — SEDIMENTATION RATE: Sed Rate: 91 mm/hr — ABNORMAL HIGH (ref 0–20)

## 2022-09-22 LAB — C-REACTIVE PROTEIN: CRP: 3.8 mg/dL (ref 0.5–20.0)

## 2022-10-04 ENCOUNTER — Other Ambulatory Visit: Payer: Self-pay

## 2022-10-04 ENCOUNTER — Ambulatory Visit (INDEPENDENT_AMBULATORY_CARE_PROVIDER_SITE_OTHER): Payer: PPO | Admitting: Infectious Diseases

## 2022-10-04 ENCOUNTER — Encounter: Payer: Self-pay | Admitting: Infectious Diseases

## 2022-10-04 VITALS — BP 96/66 | HR 92 | Resp 16 | Ht 69.0 in | Wt 187.6 lb

## 2022-10-04 DIAGNOSIS — K029 Dental caries, unspecified: Secondary | ICD-10-CM | POA: Diagnosis not present

## 2022-10-04 DIAGNOSIS — J189 Pneumonia, unspecified organism: Secondary | ICD-10-CM

## 2022-10-04 DIAGNOSIS — Z79899 Other long term (current) drug therapy: Secondary | ICD-10-CM

## 2022-10-04 LAB — CBC
MCV: 94 fL (ref 80.0–100.0)
MPV: 9.8 fL (ref 7.5–12.5)
Platelets: 396 10*3/uL (ref 140–400)

## 2022-10-04 MED ORDER — CEFPODOXIME PROXETIL 200 MG PO TABS: 200.0000 mg | ORAL_TABLET | Freq: Two times a day (BID) | ORAL | 0 refills | Status: DC

## 2022-10-04 MED ORDER — METRONIDAZOLE 500 MG PO TABS: 500.0000 mg | ORAL_TABLET | Freq: Two times a day (BID) | ORAL | 0 refills | Status: DC

## 2022-10-04 NOTE — Progress Notes (Addendum)
Patient Active Problem List   Diagnosis Date Noted   Malnutrition of moderate degree 09/09/2022   CAP (community acquired pneumonia) 09/06/2022   Multifocal pneumonia 09/05/2022   Unintended weight loss 09/05/2022   Night sweats 09/05/2022   Mitral valve disease 08/31/2022   Dyspnea on exertion 11/25/2021   Carcinoma in situ of skin of right lower limb, including hip 07/06/2021   Displacement of lumbar intervertebral disc 07/06/2021   Lumbar radiculopathy 07/06/2021   Neoplasm of uncertain behavior of skin 07/06/2021   Rosacea 07/06/2021   Squamous cell carcinoma in situ 07/06/2021   Mitral regurgitation 04/29/2021   S/P MVR (mitral valve repair) 36 mm Simulus semi rigid band) via Heartport 04/29/2021 04/29/2021   CHF (congestive heart failure), NYHA class II, chronic, diastolic (HCC) 04/14/2021   Acute mitral regurgitation from chordal rupture (HCC) 01/25/2021   Acute midline low back pain with right-sided sciatica 08/14/2020   Hypertensive retinopathy of both eyes 07/17/2020   Acute left-sided low back pain with left-sided sciatica 04/24/2020   Hypertension 09/09/2019   Tinea pedis 05/04/2018   Thoracic aorta atherosclerosis (HCC) 05/13/2017   Hepatic steatosis 05/13/2017   Medicare annual wellness visit, subsequent 04/21/2017   Advanced care planning/counseling discussion 04/21/2017   Dyslipidemia 04/21/2017   Nasal septal deviation 04/14/2017   Vitreous floaters of left eye 12/22/2015   Osteoarthritis of CMC joint of thumb 09/17/2015   Chronic leg pain 08/07/2015   Ex-smoker    GERD (gastroesophageal reflux disease)    Overweight (BMI 25.0-29.9) 08/09/2012   Diastasis recti 08/09/2012   Bullous emphysema (HCC) 08/05/2009    Patient's Medications  New Prescriptions   No medications on file  Previous Medications   ACETAMINOPHEN (TYLENOL) 650 MG CR TABLET    Take 650 mg by mouth 2 (two) times daily.   ASCORBIC ACID 500 MG CHEW    Chew 1 tablet by mouth  daily.   ASPIRIN EC 81 MG TABLET    Take 81 mg by mouth daily. Swallow whole.   ATORVASTATIN (LIPITOR) 20 MG TABLET    TAKE 1 TABLET BY MOUTH EVERY DAY   B COMPLEX VITAMINS TABLET    Take 1 tablet by mouth daily.   DEXTROMETHORPHAN (DELSYM) 30 MG/5ML LIQUID    Take 5 mLs (30 mg total) by mouth 2 (two) times daily as needed for cough.   FUROSEMIDE (LASIX) 20 MG TABLET    Take 1 tablet (20 mg total) by mouth daily as needed for fluid or edema (weight gain 3 Lbs in 3 days).   LISINOPRIL (ZESTRIL) 10 MG TABLET    Take 1 tablet (10 mg total) by mouth at bedtime.   OMEGA-3 FATTY ACIDS (FISH OIL) 1000 MG CAPS    Take 1 capsule (1,000 mg total) by mouth daily.   OMEPRAZOLE-SODIUM BICARBONATE (ZEGERID OTC) 20-1100 MG CAPS CAPSULE    Take 1 capsule by mouth daily before breakfast.   SACCHAROMYCES BOULARDII (FLORASTOR) 250 MG CAPSULE    Take 1 capsule (250 mg total) by mouth 2 (two) times daily.   UMECLIDINIUM BROMIDE (INCRUSE ELLIPTA) 62.5 MCG/ACT AEPB    Inhale 1 puff into the lungs daily.  Modified Medications   Modified Medication Previous Medication   CEFPODOXIME (VANTIN) 200 MG TABLET cefpodoxime (VANTIN) 200 MG tablet      Take 1 tablet (200 mg total) by mouth 2 (two) times daily.    Take 1 tablet (200 mg total) by mouth 2 (two) times daily.  METRONIDAZOLE (FLAGYL) 500 MG TABLET metroNIDAZOLE (FLAGYL) 500 MG tablet      Take 1 tablet (500 mg total) by mouth 2 (two) times daily.    Take 1 tablet (500 mg total) by mouth 3 (three) times daily.  Discontinued Medications   No medications on file    Subjective: 77 year old male with prior history of COPD, ex smoker, GERD, MR with MV annular ring repair, hernia mesh repair at [facility] 04/29/2021, who is here for HFU for multifocal pna after failed OP tx. Patient was discharged on 6/30 with plan to complete a month course of PO cefpodoxime and metronidazole.   Seen by Cardiology as well as PCP after hospital discharge. Last OV nots reviewed.    10/04/22 Accompanied by wife. Taking cefpodoxime and metronidazole as instructed. Denies any concerns with abtx or missing doses. He has significantly improved symptomatically. However still feels weak and energy is not back to baseline.  Reports loosing around 30 lbs since last admission and appetite has been poor. Cough is 4-5 bouts every day with green/mucus phlegm. He has been seen by dentist and planned for dental procedure in July 29. He has been prescribed PO amoxicillin prior to dental procedure by dentist.   Review of Systems: all systems reviewed with pertinent positives and negatives as listed above  Past Medical History:  Diagnosis Date   Bilateral inguinal hernia (BIH) s/p lap repair 09/06/2012 08/09/2012   Cataract    CMC arthritis, thumb, degenerative    right   COPD (chronic obstructive pulmonary disease) (HCC)    Emphysema of lung (HCC)    Ex-smoker    GERD (gastroesophageal reflux disease)    History of chicken pox    History of pneumonia 08/06/2009        Hyperlipidemia    Hypertension    Nasal septal deviation 04/14/2017   Marked   Numbness    R cheek stays numb   Obesity (BMI 30-39.9) 08/09/2012   S/P MVR (mitral valve repair) 36 mm Simulus semi rigid band) via Heartport 04/29/2021 04/29/2021   S/p minimally invasive mitral valve repair (semirigid 36mm Simulus annuloplasty band) at [facility] (Dr Zebedee Iba) 04/29/2021. Followed locally by Dr Jacinto Halim. Has been recommended dental ppx.   Echo 05/2022 - normal LVEF 55-60%, mod concentric LVH, G2DD, normal wall motion, mild-mod dilated LA, mod MR with MV annular ring repair with wall impinging MR jet but improved, mild TR (Dr Melton Alar)   Past Surgical History:  Procedure Laterality Date   ANKLE SURGERY Left 1966   with tendon repair with stainless steel wire   CARDIAC CATHETERIZATION     COLONOSCOPY  07/2017   multiple tubular adenomas, rpt 3 yrs (Pyrtle)   INSERTION OF MESH Bilateral 09/06/2012   Procedure: INSERTION OF MESH;   Surgeon: Ardeth Sportsman, MD   LAPAROSCOPIC INGUINAL HERNIA WITH UMBILICAL HERNIA Bilateral 09/06/2012   Procedure: LAPAROSCOPIC exploration and repair of hernias in bellybutton and bilateral groins with mesh;  Surgeon: Ardeth Sportsman, MD   MICRODISCECTOMY LUMBAR Right 08/2020   L4/5 (Dr Maurice Small)   MITRAL VALVE REPAIR  04/29/2021   at [facility] Dr Zebedee Iba   RIGHT/LEFT HEART CATH AND CORONARY ANGIOGRAPHY N/A 02/16/2021   Procedure: RIGHT/LEFT HEART CATH AND CORONARY ANGIOGRAPHY;  Surgeon: Yates Decamp, MD;  Location: [facility] INVASIVE CV LAB;  Service: Cardiovascular;  Laterality: N/A;   TEE WITHOUT CARDIOVERSION N/A 02/12/2021   Procedure: TRANSESOPHAGEAL ECHOCARDIOGRAM (TEE);  Surgeon: Yates Decamp, MD;  Location: [facility] ENDOSCOPY;  Service: Cardiovascular;  Laterality: N/A;  TEE WITHOUT CARDIOVERSION N/A 08/31/2022   Procedure: TRANSESOPHAGEAL ECHOCARDIOGRAM;  Surgeon: Tessa Lerner, DO;  Location: [facility] INVASIVE CV LAB;  Service: Cardiovascular;  Laterality: N/A;     Social History   Tobacco Use   Smoking status: Former    Current packs/day: 0.00    Average packs/day: 2.0 packs/day for 40.0 years (80.0 ttl pk-yrs)    Types: Cigarettes    Start date: 03/14/1968    Quit date: 03/14/2006    Years since quitting: 16.5    Passive exposure: Past   Smokeless tobacco: Never  Vaping Use   Vaping status: Never Used  Substance Use Topics   Alcohol use: Not Currently    Comment: Occasionally   Drug use: No    Family History  Problem Relation Age of Onset   Cancer Mother        breast   Congenital heart disease Mother    Diabetes Mother    COPD Father    Cancer Sister        lung (minimal smoker)   Colon cancer Neg Hx    Esophageal cancer Neg Hx    Liver cancer Neg Hx    Rectal cancer Neg Hx    Stomach cancer Neg Hx    Pancreatic cancer Neg Hx     No Known Allergies  Health Maintenance  Topic Date Due   Zoster Vaccines- Shingrix (1 of 2) 03/20/1966   COVID-19 Vaccine (6 - 2023-24 season)  02/15/2022   DTaP/Tdap/Td (2 - Td or Tdap) 03/14/2022   INFLUENZA VACCINE  10/13/2022   Medicare Annual Wellness (AWV)  07/11/2023   Colonoscopy  05/11/2025   Pneumonia Vaccine 78+ Years old  Completed   Hepatitis C Screening  Completed   HPV VACCINES  Aged Out    Objective:  Vitals:   10/04/22 1542  BP: 96/66  Pulse: 92  Resp: 16  SpO2: 97%  Weight: 187 lb 9.6 oz (85.1 kg)  Height: 5\' 9"  (1.753 m)   Body mass index is 27.7 kg/m.  Physical Exam Constitutional:      Appearance: Normal appearance.  HENT:     Head: Normocephalic and atraumatic.      Mouth: Mucous membranes are moist.  Eyes:    Conjunctiva/sclera: Conjunctivae normal.     Pupils:   Cardiovascular:     Rate and Rhythm: Normal rate and regular rhythm.     Heart sounds:   Pulmonary:     Effort: Pulmonary effort is normal.     Breath sounds: Bilateral clear air entry  Abdominal:     General: Non distended     Palpations: soft.   Musculoskeletal:        General: Normal range of motion.   Skin:    General: Skin is warm and dry.     Comments:  Neurological:     General: grossly non focal     Mental Status: awake, alert and oriented to person, place, and time.   Psychiatric:        Mood and Affect: Mood normal.   Lab Results Lab Results  Component Value Date   WBC 14.1 (H) 09/21/2022   HGB 12.7 (L) 09/21/2022   HCT 38.2 (L) 09/21/2022   MCV 93.0 09/21/2022   PLT 409.0 (H) 09/21/2022    Lab Results  Component Value Date   CREATININE 1.07 09/14/2022   BUN 25 (H) 09/14/2022   NA 134 (L) 09/14/2022   K 4.5 09/14/2022   CL 102 09/14/2022  CO2 24 09/14/2022    Lab Results  Component Value Date   ALT 27 09/14/2022   AST 31 09/14/2022   ALKPHOS 75 09/14/2022   BILITOT 0.6 09/14/2022    Lab Results  Component Value Date   CHOL 94 07/07/2021   HDL 35.10 (L) 07/07/2021   LDLCALC 37 07/07/2021   TRIG 114.0 07/07/2021   CHOLHDL 3 07/07/2021   No results found for: "LABRPR",  "RPRTITER" No results found for: "HIV1RNAQUANT", "HIV1RNAVL", "CD4TABS"  Assessment/Plan 77 year old male with prior history of COPD, ex smoker, GERD, MR with MV annular ring repair, hernia mesh repair at [facility] 04/29/2021, who failed OP tx for PNA with    # Multifocal pneumonia.  S/p 10 days course of augmentin and 5 days of azithromycin end of May with persistent symptoms  H/o GERD but atypical location for aspiration pneumonia. He does not seem to be a host for aspiration He has no known contact with TB. Quantiferon 6/24 is negative  6/25 HIV NR, RVP full panel negative, MRSA PCR is negative  EX smoker, needs repeat imaging to ensure does not need malignancy work up  Per Pulm, need anaerobic coverage and possible transition to a longer course of PO augmentin for approx 4 weeks for complicated anaerobic infection. Needs repeat CT scan of the chest in 6 to 8 weeks to look for resolution of his pulmonary infiltrates. Needs dental evaluation, consider role for extractions  Plan  Continue cefpodoxime and metronidazole as is  Repeat CT chest in 2 weeks or after ( 6 weeks after last CT chest) to fu on prior abnormlaities Labs today  FU in 2 weeks after CT Chest  # Dental Caries  - Fu with Dentist for recommended procedure  - he is prescribed PO amoxicillin ppx by his dentist   # Chronic diastolic CHF, MVR s/p MV repair  - Follows with Cardiology   # Emphysema/EX smoker  - Follow up with Pulmonary as instructed   # Weight loss - Likely related to recent hospitalization related to complicated pna - Follow up with PCP   I have personally spent 41 minutes involved in face-to-face and non-face-to-face activities for this patient on the day of the visit. Professional time spent includes the following activities: Preparing to see the patient (review of tests), Obtaining and/or reviewing separately obtained history (admission/discharge record), Performing a medically appropriate examination and/or  evaluation , Ordering medications/tests/procedures, referring and communicating with other health care professionals, Documenting clinical information in the EMR, Independently interpreting results (not separately reported), Communicating results to the patient/family/caregiver, Counseling and educating the patient/family/caregiver and Care coordination (not separately reported).   Victoriano Lain, MD [facility] 10/04/2022, 4:07 PM

## 2022-10-05 ENCOUNTER — Ambulatory Visit (INDEPENDENT_AMBULATORY_CARE_PROVIDER_SITE_OTHER): Payer: PPO | Admitting: Primary Care

## 2022-10-05 ENCOUNTER — Encounter: Payer: Self-pay | Admitting: Primary Care

## 2022-10-05 VITALS — BP 98/62 | HR 97 | Temp 98.1°F | Ht 69.0 in | Wt 186.6 lb

## 2022-10-05 DIAGNOSIS — J439 Emphysema, unspecified: Secondary | ICD-10-CM | POA: Diagnosis not present

## 2022-10-05 LAB — COMPREHENSIVE METABOLIC PANEL
AG Ratio: 1 (calc) (ref 1.0–2.5)
ALT: 22 U/L (ref 9–46)
AST: 36 U/L — ABNORMAL HIGH (ref 10–35)
Albumin: 3.5 g/dL — ABNORMAL LOW (ref 3.6–5.1)
Alkaline phosphatase (APISO): 53 U/L (ref 35–144)
BUN/Creatinine Ratio: 28 (calc) — ABNORMAL HIGH (ref 6–22)
BUN: 27 mg/dL — ABNORMAL HIGH (ref 7–25)
CO2: 25 mmol/L (ref 20–32)
Calcium: 8.8 mg/dL (ref 8.6–10.3)
Chloride: 100 mmol/L (ref 98–110)
Creat: 0.95 mg/dL (ref 0.70–1.28)
Globulin: 3.6 g/dL (calc) (ref 1.9–3.7)
Glucose, Bld: 94 mg/dL (ref 65–99)
Potassium: 4.8 mmol/L (ref 3.5–5.3)
Sodium: 133 mmol/L — ABNORMAL LOW (ref 135–146)
Total Bilirubin: 0.4 mg/dL (ref 0.2–1.2)
Total Protein: 7.1 g/dL (ref 6.1–8.1)

## 2022-10-05 LAB — CBC
HCT: 39.2 % (ref 38.5–50.0)
Hemoglobin: 13 g/dL — ABNORMAL LOW (ref 13.2–17.1)
MCH: 31.2 pg (ref 27.0–33.0)
MCHC: 33.2 g/dL (ref 32.0–36.0)
RBC: 4.17 10*6/uL — ABNORMAL LOW (ref 4.20–5.80)
RDW: 14.1 % (ref 11.0–15.0)
WBC: 11.5 10*3/uL — ABNORMAL HIGH (ref 3.8–10.8)

## 2022-10-05 NOTE — Patient Instructions (Addendum)
  Recommendations:  Continue Incruse one puff daily in the morning  Use flutter valve 5 breaths three times a day (blue) Continue Delsym cough syrup twice daily for cough suppression I will check with Dr. Delton Coombes about dental procedure Keep apt for CT chest in August Stay on abx until you follow back with infectious disease  You can look into getting Medcline body pillow to elevate head while sleeping   Follow-up: 4-6 weeks with Dr. Delton Coombes or Waynetta Sandy NP

## 2022-10-05 NOTE — Progress Notes (Signed)
@Patient  ID: Ryan Mathews, male    DOB: [birth year], 77 y.o.   MRN: [id number]  Chief Complaint  Patient presents with   Hospitalization Follow-up    Referring provider: [name], MD  HPI: 77 year old male, former smoker.  Past medical history significant for CHF, hypertension, mitral valve disease/mitral regurgitation status post MVR, thoracic aorta arthrosclerosis, bullous emphysema, multifocal pneumonia, GERD, hepatic steatosis, skin cancer, dyslipidemia.  10/05/2022 Medication history COPD with emphysema Admitted 09/05/22- 09/11/22 for pneumonia. He is taking cefpodoxime and flagyl.  Breathing has been ok, the extreme heat does make shortness of breath worse  He is not walking as much as he used to prior to hospitalization Still has a congested cough, this has improved. Not lasting all day. He has coughing intermittent coughing spells later afternoon and at night. He is not coughing up as much phelgm.  Not using flutter valve currently. Took mucinex for 14 days and then stopped  Compliant with Incruse daily. He does not feel he needs rescue inhaler. He is not on oxygen. Biggest problem is eating, he trys to eat but has no taste. He has lost weigh.  He has been eating fruit and drinking protein drinks  Sleeping in recliner but recently started sleeping back in bed with head elevated  He had some ankle swelling last two days, took prn lasix. Swelling improved.  No fevers, chest tightness or chest pain    Tentative date for oral surgery is July 29th  CT chest scheduled is scheduled for August 1st  Follow up 2 weeks with ID on August 15    No Known Allergies  Immunization History  Administered Date(s) Administered   Covid-19, Mrna,Vaccine(Spikevax)17yrs and older 12/21/2021   Influenza Split 11/13/2010   Influenza Whole 12/13/2011   Influenza, High Dose Seasonal PF 12/22/2016, 01/25/2018, 11/23/2018, 11/29/2019, 12/18/2020, 12/21/2021   Influenza,inj,Quad PF,6+ Mos  12/22/2015   Influenza-Unspecified 01/16/2021   PFIZER(Purple Top)SARS-COV-2 Vaccination 04/05/2019, 04/26/2019, 12/16/2019   Pfizer Covid-19 Vaccine Bivalent Booster 5yrs & up 01/08/2021   Pneumococcal Conjugate-13 10/15/2015   Pneumococcal Polysaccharide-23 05/03/2011, 04/21/2017   Zoster, Live 01/13/2012    Past Medical History:  Diagnosis Date   Bilateral inguinal hernia (BIH) s/p lap repair 09/06/2012 08/09/2012   Cataract    CMC arthritis, thumb, degenerative    right   COPD (chronic obstructive pulmonary disease) (HCC)    Emphysema of lung (HCC)    Ex-smoker    GERD (gastroesophageal reflux disease)    History of chicken pox    History of pneumonia 08/06/2009        Hyperlipidemia    Hypertension    Nasal septal deviation 04/14/2017   Marked   Numbness    R cheek stays numb   Obesity (BMI 30-39.9) 08/09/2012   S/P MVR (mitral valve repair) 36 mm Simulus semi rigid band) via Heartport 04/29/2021 04/29/2021   S/p minimally invasive mitral valve repair (semirigid 36mm Simulus annuloplasty band) at [facility] (Dr Zebedee Iba) 04/29/2021. Followed locally by Dr Jacinto Halim. Has been recommended dental ppx.   Echo 05/2022 - normal LVEF 55-60%, mod concentric LVH, G2DD, normal wall motion, mild-mod dilated LA, mod MR with MV annular ring repair with wall impinging MR jet but improved, mild TR (Dr Melton Alar)    Tobacco History: Social History   Tobacco Use  Smoking Status Former   Current packs/day: 0.00   Average packs/day: 2.0 packs/day for 40.0 years (80.0 ttl pk-yrs)   Types: Cigarettes   Start date: 03/14/1968   Quit date: 03/14/2006  Years since quitting: 16.5   Passive exposure: Past  Smokeless Tobacco Never   Counseling given: Not Answered   Outpatient Medications Prior to Visit  Medication Sig Dispense Refill   acetaminophen (TYLENOL) 650 MG CR tablet Take 650 mg by mouth 2 (two) times daily.     Ascorbic Acid 500 MG CHEW Chew 1 tablet by mouth daily.     aspirin EC 81 MG tablet  Take 81 mg by mouth daily. Swallow whole.     atorvastatin (LIPITOR) 20 MG tablet TAKE 1 TABLET BY MOUTH EVERY DAY 90 tablet 1   b complex vitamins tablet Take 1 tablet by mouth daily.     cefpodoxime (VANTIN) 200 MG tablet Take 1 tablet (200 mg total) by mouth 2 (two) times daily. 60 tablet 0   dextromethorphan (DELSYM) 30 MG/5ML liquid Take 5 mLs (30 mg total) by mouth 2 (two) times daily as needed for cough. 89 mL 0   furosemide (LASIX) 20 MG tablet Take 1 tablet (20 mg total) by mouth daily as needed for fluid or edema (weight gain 3 Lbs in 3 days). 90 tablet 3   metroNIDAZOLE (FLAGYL) 500 MG tablet Take 1 tablet (500 mg total) by mouth 2 (two) times daily. 60 tablet 0   Omega-3 Fatty Acids (FISH OIL) 1000 MG CAPS Take 1 capsule (1,000 mg total) by mouth daily.     Omeprazole-Sodium Bicarbonate (ZEGERID OTC) 20-1100 MG CAPS capsule Take 1 capsule by mouth daily before breakfast. (Patient taking differently: Take 1 capsule by mouth as needed (Acid reflux).) 28 each 0   saccharomyces boulardii (FLORASTOR) 250 MG capsule Take 1 capsule (250 mg total) by mouth 2 (two) times daily. 60 capsule 0   umeclidinium bromide (INCRUSE ELLIPTA) 62.5 MCG/ACT AEPB Inhale 1 puff into the lungs daily. 30 each 3   lisinopril (ZESTRIL) 10 MG tablet Take 1 tablet (10 mg total) by mouth at bedtime. 90 tablet 3   No facility-administered medications prior to visit.   Review of Systems  Review of Systems  Constitutional:  Positive for fatigue and unexpected weight change. Negative for fever.  Respiratory:  Positive for cough. Negative for chest tightness and wheezing.        DOE     Physical Exam  BP 98/62 (BP Location: Right Arm, Patient Position: Sitting, Cuff Size: Normal)   Pulse 97   Temp 98.1 F (36.7 C) (Oral)   Ht 5\' 9"  (1.753 m)   Wt 186 lb 9.6 oz (84.6 kg)   SpO2 96%   BMI 27.56 kg/m  Physical Exam Constitutional:      Appearance: Normal appearance.  HENT:     Head: Normocephalic and  atraumatic.     Mouth/Throat:     Mouth: Mucous membranes are moist.     Pharynx: Oropharynx is clear.  Cardiovascular:     Rate and Rhythm: Normal rate and regular rhythm.  Pulmonary:     Effort: Pulmonary effort is normal.     Breath sounds: Normal breath sounds.     Comments: Fine crackles lung bases R>L. No overt rhonchi or wheezing  Musculoskeletal:        General: Normal range of motion.  Skin:    General: Skin is warm and dry.  Neurological:     General: No focal deficit present.     Mental Status: He is alert and oriented to person, place, and time. Mental status is at baseline.  Psychiatric:        Mood and  Affect: Mood normal.        Behavior: Behavior normal.        Thought Content: Thought content normal.        Judgment: Judgment normal.      Lab Results:  CBC    Component Value Date/Time   WBC 11.5 (H) 10/04/2022 1613   RBC 4.17 (L) 10/04/2022 1613   HGB 13.0 (L) 10/04/2022 1613   HGB 18.4 (H) 02/08/2021 0908   HCT 39.2 10/04/2022 1613   HCT 53.6 (H) 02/08/2021 0908   PLT 396 10/04/2022 1613   PLT 245 02/08/2021 0908   MCV 94.0 10/04/2022 1613   MCV 93 02/08/2021 0908   MCH 31.2 10/04/2022 1613   MCHC 33.2 10/04/2022 1613   RDW 14.1 10/04/2022 1613   RDW 12.2 02/08/2021 0908   LYMPHSABS 2.1 09/21/2022 1452   MONOABS 0.9 09/21/2022 1452   EOSABS 0.5 09/21/2022 1452   BASOSABS 0.2 (H) 09/21/2022 1452    BMET    Component Value Date/Time   NA 133 (L) 10/04/2022 1613   NA 140 06/02/2022 0745   K 4.8 10/04/2022 1613   CL 100 10/04/2022 1613   CO2 25 10/04/2022 1613   GLUCOSE 94 10/04/2022 1613   BUN 27 (H) 10/04/2022 1613   BUN 26 06/02/2022 0745   CREATININE 0.95 10/04/2022 1613   CALCIUM 8.8 10/04/2022 1613   GFRNONAA >60 09/10/2022 0425   GFRAA >90 03/19/2011 0025    BNP    Component Value Date/Time   BNP 33.7 03/15/2021 2158    ProBNP    Component Value Date/Time   PROBNP 301 08/30/2022 1355   PROBNP 112.0 (H) 01/25/2021 1300     Imaging: No results found.   Assessment & Plan:   CAP (community acquired pneumonia) - Improved; Recently hospitalized for pneumonia in June.  Currently on cefpodoxime and Flagyl.  Following with infectious disease.  Cough has improved significantly but he continues to have intermittent coughing spells in late afternoon and at night.  Sputum production has lessened.  Encourage patient continue use flutter valve 3 times daily and take Delsym cough syrup twice daily to loosen congestion.  Patient scheduled for CT chest on August 1 to follow-up on multifocal pneumonia.  He is scheduled to have an oral procedure on July 29, he will be considered high risk for prolonged mechanical ventilation and/or postop pulmonary complications due to recent hospitalization for pneumonia.  Clearance will be decided by oral surgeon.   Bullous emphysema (HCC) - Continue Incruse Ellipta 1 puff daily  Recommendations:  Continue Incruse one puff daily in the morning  Use flutter valve 5 breaths three times a day (blue) Continue Delsym cough syrup twice daily for cough suppression I will check with Dr. Delton Coombes about dental procedure Keep apt for CT chest in August Stay on abx until you follow back with infectious disease  You can look into getting Medcline body pillow to elevate head while sleeping   Follow-up: 4-6 weeks with Dr. Delton Coombes or Waynetta Sandy NP    Glenford Bayley, NP 10/10/2022

## 2022-10-10 NOTE — Assessment & Plan Note (Signed)
-   Continue Incruse Ellipta 1 puff daily

## 2022-10-10 NOTE — Assessment & Plan Note (Addendum)
-   Improved; Recently hospitalized for pneumonia in June.  Currently on cefpodoxime and Flagyl.  Following with infectious disease.  Cough has improved significantly but he continues to have intermittent coughing spells in late afternoon and at night.  Sputum production has lessened.  Encourage patient continue use flutter valve 3 times daily and take Delsym cough syrup twice daily to loosen congestion.  Patient scheduled for CT chest on August 1 to follow-up on multifocal pneumonia.  He is scheduled to have an oral procedure on July 29, he will be considered high risk for prolonged mechanical ventilation and/or postop pulmonary complications due to recent hospitalization for pneumonia.  Clearance will be decided by oral surgeon.

## 2022-10-13 ENCOUNTER — Ambulatory Visit (HOSPITAL_COMMUNITY)
Admission: RE | Admit: 2022-10-13 | Discharge: 2022-10-13 | Disposition: A | Discharge status: Home / Self Care | Payer: PPO | Source: Ambulatory Visit | Attending: Infectious Diseases | Admitting: Infectious Diseases

## 2022-10-13 ENCOUNTER — Encounter (HOSPITAL_COMMUNITY): Payer: Self-pay

## 2022-10-13 DIAGNOSIS — J90 Pleural effusion, not elsewhere classified: Secondary | ICD-10-CM | POA: Diagnosis not present

## 2022-10-13 DIAGNOSIS — J189 Pneumonia, unspecified organism: Secondary | ICD-10-CM | POA: Insufficient documentation

## 2022-10-13 DIAGNOSIS — J432 Centrilobular emphysema: Secondary | ICD-10-CM | POA: Diagnosis not present

## 2022-10-13 DIAGNOSIS — J479 Bronchiectasis, uncomplicated: Secondary | ICD-10-CM | POA: Diagnosis not present

## 2022-10-13 MED ORDER — SODIUM CHLORIDE (PF) 0.9 % IJ SOLN
INTRAMUSCULAR | Status: AC
  Filled 2022-10-13: qty 50

## 2022-10-13 MED ORDER — IOHEXOL 300 MG/ML  SOLN
75.0000 mL | Freq: Once | INTRAMUSCULAR | Status: AC | PRN
  Administered 2022-10-13: 75 mL via INTRAVENOUS

## 2022-10-27 ENCOUNTER — Encounter: Payer: Self-pay | Admitting: Infectious Diseases

## 2022-10-27 ENCOUNTER — Ambulatory Visit (INDEPENDENT_AMBULATORY_CARE_PROVIDER_SITE_OTHER): Payer: PPO | Admitting: Infectious Diseases

## 2022-10-27 ENCOUNTER — Other Ambulatory Visit: Payer: Self-pay

## 2022-10-27 VITALS — BP 100/66 | HR 74 | Resp 16 | Ht 69.0 in | Wt 184.0 lb

## 2022-10-27 DIAGNOSIS — J189 Pneumonia, unspecified organism: Secondary | ICD-10-CM

## 2022-10-27 DIAGNOSIS — Z79899 Other long term (current) drug therapy: Secondary | ICD-10-CM | POA: Diagnosis not present

## 2022-10-27 MED ORDER — METRONIDAZOLE 500 MG PO TABS: 500.0000 mg | ORAL_TABLET | Freq: Two times a day (BID) | ORAL | 0 refills | Status: DC

## 2022-10-27 MED ORDER — CEFPODOXIME PROXETIL 200 MG PO TABS: 200.0000 mg | ORAL_TABLET | Freq: Two times a day (BID) | ORAL | 0 refills | Status: AC

## 2022-10-27 NOTE — Progress Notes (Signed)
Patient Active Problem List   Diagnosis Date Noted   Medication management 10/04/2022   Dental caries 10/04/2022   Malnutrition of moderate degree 09/09/2022   CAP (community acquired pneumonia) 09/06/2022   Multifocal pneumonia 09/05/2022   Unintended weight loss 09/05/2022   Night sweats 09/05/2022   Mitral valve disease 08/31/2022   Dyspnea on exertion 11/25/2021   Carcinoma in situ of skin of right lower limb, including hip 07/06/2021   Displacement of lumbar intervertebral disc 07/06/2021   Lumbar radiculopathy 07/06/2021   Neoplasm of uncertain behavior of skin 07/06/2021   Rosacea 07/06/2021   Squamous cell carcinoma in situ 07/06/2021   Mitral regurgitation 04/29/2021   S/P MVR (mitral valve repair) 36 mm Simulus semi rigid band) via Heartport 04/29/2021 04/29/2021   CHF (congestive heart failure), NYHA class II, chronic, diastolic (HCC) 04/14/2021   Acute mitral regurgitation from chordal rupture (HCC) 01/25/2021   Acute midline low back pain with right-sided sciatica 08/14/2020   Hypertensive retinopathy of both eyes 07/17/2020   Acute left-sided low back pain with left-sided sciatica 04/24/2020   Hypertension 09/09/2019   Tinea pedis 05/04/2018   Thoracic aorta atherosclerosis (HCC) 05/13/2017   Hepatic steatosis 05/13/2017   Medicare annual wellness visit, subsequent 04/21/2017   Advanced care planning/counseling discussion 04/21/2017   Dyslipidemia 04/21/2017   Nasal septal deviation 04/14/2017   Vitreous floaters of left eye 12/22/2015   Osteoarthritis of CMC joint of thumb 09/17/2015   Chronic leg pain 08/07/2015   Ex-smoker    GERD (gastroesophageal reflux disease)    Overweight (BMI 25.0-29.9) 08/09/2012   Diastasis recti 08/09/2012   Bullous emphysema (HCC) 08/05/2009    Patient's Medications  New Prescriptions   No medications on file  Previous Medications   ACETAMINOPHEN (TYLENOL) 650 MG CR TABLET    Take 650 mg by mouth 2 (two) times daily.    ASCORBIC ACID 500 MG CHEW    Chew 1 tablet by mouth daily.   ASPIRIN EC 81 MG TABLET    Take 81 mg by mouth daily. Swallow whole.   ATORVASTATIN (LIPITOR) 20 MG TABLET    TAKE 1 TABLET BY MOUTH EVERY DAY   B COMPLEX VITAMINS TABLET    Take 1 tablet by mouth daily.   CEFPODOXIME (VANTIN) 200 MG TABLET    Take 1 tablet (200 mg total) by mouth 2 (two) times daily.   DEXTROMETHORPHAN (DELSYM) 30 MG/5ML LIQUID    Take 5 mLs (30 mg total) by mouth 2 (two) times daily as needed for cough.   FUROSEMIDE (LASIX) 20 MG TABLET    Take 1 tablet (20 mg total) by mouth daily as needed for fluid or edema (weight gain 3 Lbs in 3 days).   LISINOPRIL (ZESTRIL) 10 MG TABLET    Take 1 tablet (10 mg total) by mouth at bedtime.   METRONIDAZOLE (FLAGYL) 500 MG TABLET    Take 1 tablet (500 mg total) by mouth 2 (two) times daily.   OMEGA-3 FATTY ACIDS (FISH OIL) 1000 MG CAPS    Take 1 capsule (1,000 mg total) by mouth daily.   OMEPRAZOLE-SODIUM BICARBONATE (ZEGERID OTC) 20-1100 MG CAPS CAPSULE    Take 1 capsule by mouth daily before breakfast.   SACCHAROMYCES BOULARDII (FLORASTOR) 250 MG CAPSULE    Take 1 capsule (250 mg total) by mouth 2 (two) times daily.   UMECLIDINIUM BROMIDE (INCRUSE ELLIPTA) 62.5 MCG/ACT AEPB    Inhale 1 puff into the lungs daily.  Modified Medications  No medications on file  Discontinued Medications   No medications on file    Subjective: 77 year old male with prior history of COPD, ex smoker, GERD, MR with MV annular ring repair, hernia mesh repair at [facility] 04/29/2021, who is here for HFU for multifocal pna after failed OP tx. Patient was discharged on 6/30 with plan to complete a month course of PO cefpodoxime and metronidazole.   Seen by Cardiology as well as PCP after hospital discharge. Last OV nots reviewed.   10/04/22 Accompanied by wife. Taking cefpodoxime and metronidazole as instructed. Denies any concerns with abtx or missing doses. He has significantly improved symptomatically.  However still feels weak and energy is not back to baseline.  Reports loosing around 30 lbs since last admission and appetite has been poor. Cough is 4-5 bouts every day with green/mucus phlegm. He has been seen by dentist and planned for dental procedure in July 29. He has been prescribed PO amoxicillin prior to dental procedure by dentist.   10/27/22 Accompanied by his wife. Reports taking cefpodoxime and metronidazole as instructed without missing doses or concerns. Reports cough has completely resolved, may be cough once in a while. SOB is improving, thinks although breathing is better, he does not feel back to his normal baseline and tells me is a very active person prior to the PNA. He had a period of SOB [place]. Denies fevers, chills. He feels somewhat dizzy after taking all medications after food in the morning  with no chest pain, palpitations. He is on anti HTN pills and asked to fu with PCP if needs to be adjusted.  No complaints otherwise.   Review of Systems: all systems reviewed with pertinent positives and negatives as listed above  Past Medical History:  Diagnosis Date   Bilateral inguinal hernia (BIH) s/p lap repair 09/06/2012 08/09/2012   Cataract    CMC arthritis, thumb, degenerative    right   COPD (chronic obstructive pulmonary disease) (HCC)    Emphysema of lung (HCC)    Ex-smoker    GERD (gastroesophageal reflux disease)    History of chicken pox    History of pneumonia 08/06/2009        Hyperlipidemia    Hypertension    Nasal septal deviation 04/14/2017   Marked   Numbness    R cheek stays numb   Obesity (BMI 30-39.9) 08/09/2012   S/P MVR (mitral valve repair) 36 mm Simulus semi rigid band) via Heartport 04/29/2021 04/29/2021   S/p minimally invasive mitral valve repair (semirigid 36mm Simulus annuloplasty band) at [facility] (Dr Zebedee Iba) 04/29/2021. Followed locally by Dr Jacinto Halim. Has been recommended dental ppx.   Echo 05/2022 -  normal LVEF 55-60%, mod concentric LVH, G2DD, normal wall motion, mild-mod dilated LA, mod MR with MV annular ring repair with wall impinging MR jet but improved, mild TR (Dr Melton Alar)   Past Surgical History:  Procedure Laterality Date   ANKLE SURGERY Left 1966   with tendon repair with stainless steel wire   CARDIAC CATHETERIZATION     COLONOSCOPY  07/2017   multiple tubular adenomas, rpt 3 yrs (Pyrtle)   INSERTION OF MESH Bilateral 09/06/2012   Procedure: INSERTION OF MESH;  Surgeon: Ardeth Sportsman, MD   LAPAROSCOPIC INGUINAL HERNIA WITH UMBILICAL HERNIA Bilateral 09/06/2012   Procedure: LAPAROSCOPIC exploration and repair of hernias in bellybutton and bilateral groins with mesh;  Surgeon: Ardeth Sportsman, MD   MICRODISCECTOMY LUMBAR Right 08/2020   L4/5 (  Dr Maurice Small)   MITRAL VALVE REPAIR  04/29/2021   at [facility] Dr Zebedee Iba   RIGHT/LEFT HEART CATH AND CORONARY ANGIOGRAPHY N/A 02/16/2021   Procedure: RIGHT/LEFT HEART CATH AND CORONARY ANGIOGRAPHY;  Surgeon: Yates Decamp, MD;  Location: [facility] INVASIVE CV LAB;  Service: Cardiovascular;  Laterality: N/A;   TEE WITHOUT CARDIOVERSION N/A 02/12/2021   Procedure: TRANSESOPHAGEAL ECHOCARDIOGRAM (TEE);  Surgeon: Yates Decamp, MD;  Location: [facility] ENDOSCOPY;  Service: Cardiovascular;  Laterality: N/A;   TEE WITHOUT CARDIOVERSION N/A 08/31/2022   Procedure: TRANSESOPHAGEAL ECHOCARDIOGRAM;  Surgeon: Tessa Lerner, DO;  Location: [facility] INVASIVE CV LAB;  Service: Cardiovascular;  Laterality: N/A;     Social History   Tobacco Use   Smoking status: Former    Current packs/day: 0.00    Average packs/day: 2.0 packs/day for 40.0 years (80.0 ttl pk-yrs)    Types: Cigarettes    Start date: 03/14/1968    Quit date: 03/14/2006    Years since quitting: 16.6    Passive exposure: Past   Smokeless tobacco: Never  Vaping Use   Vaping status: Never Used  Substance Use Topics   Alcohol use: Not Currently    Comment: Occasionally   Drug use: No    Family History   Problem Relation Age of Onset   Cancer Mother        breast   Congenital heart disease Mother    Diabetes Mother    COPD Father    Cancer Sister        lung (minimal smoker)   Colon cancer Neg Hx    Esophageal cancer Neg Hx    Liver cancer Neg Hx    Rectal cancer Neg Hx    Stomach cancer Neg Hx    Pancreatic cancer Neg Hx     No Known Allergies  Health Maintenance  Topic Date Due   Zoster Vaccines- Shingrix (1 of 2) 03/20/1966   COVID-19 Vaccine (6 - 2023-24 season) 02/15/2022   DTaP/Tdap/Td (2 - Td or Tdap) 03/14/2022   INFLUENZA VACCINE  10/13/2022   Medicare Annual Wellness (AWV)  07/11/2023   Colonoscopy  05/11/2025   Pneumonia Vaccine 16+ Years old  Completed   Hepatitis C Screening  Completed   HPV VACCINES  Aged Out    Objective: BP 100/66   Pulse 74   Resp 16   Ht 5\' 9"  (1.753 m)   Wt 184 lb (83.5 kg)   SpO2 97%   BMI 27.17 kg/m    Physical Exam Constitutional:      Appearance: Normal appearance.  HENT:     Head: Normocephalic and atraumatic.      Mouth: Mucous membranes are moist.  Eyes:    Conjunctiva/sclera: Conjunctivae normal.     Pupils:   Cardiovascular:     Rate and Rhythm: Normal rate and regular rhythm.     Heart sounds:   Pulmonary:     Effort: Pulmonary effort is normal.     Breath sounds: Bilateral clear air entry  Abdominal:     General: Non distended     Palpations:   Musculoskeletal:        General: Normal range of motion.   Skin:    General: Skin is warm and dry.     Comments:  Neurological:     General: grossly non focal     Mental Status: awake, alert and oriented to person, place, and time.   Psychiatric:        Mood and Affect: Mood normal.  Lab Results Lab Results  Component Value Date   WBC 11.5 (H) 10/04/2022   HGB 13.0 (L) 10/04/2022   HCT 39.2 10/04/2022   MCV 94.0 10/04/2022   PLT 396 10/04/2022    Lab Results  Component Value Date   CREATININE 0.95 10/04/2022   BUN 27 (H) 10/04/2022   NA  133 (L) 10/04/2022   K 4.8 10/04/2022   CL 100 10/04/2022   CO2 25 10/04/2022    Lab Results  Component Value Date   ALT 22 10/04/2022   AST 36 (H) 10/04/2022   ALKPHOS 75 09/14/2022   BILITOT 0.4 10/04/2022    Lab Results  Component Value Date   CHOL 94 07/07/2021   HDL 35.10 (L) 07/07/2021   LDLCALC 37 07/07/2021   TRIG 114.0 07/07/2021   CHOLHDL 3 07/07/2021   No results found for: "LABRPR", "RPRTITER" No results found for: "HIV1RNAQUANT", "HIV1RNAVL", "CD4TABS"  Assessment/Plan 77 year old male with prior history of COPD, ex smoker, GERD, MR with MV annular ring repair, hernia mesh repair at [facility] 04/29/2021, who failed OP tx for PNA with    # Multifocal pneumonia.  S/p 10 days course of augmentin and 5 days of azithromycin end of May with persistent symptoms  H/o GERD but atypical location for aspiration pneumonia. He does not seem to be a host for aspiration He has no known contact with TB. Quantiferon 6/24 is negative  6/25 HIV NR, RVP full panel negative, MRSA PCR is negative  EX smoker, needs repeat imaging to ensure does not need malignancy work up  Per Pulm, need anaerobic coverage and possible transition to a longer course of PO augmentin for approx 4 weeks for complicated anaerobic infection. Needs repeat CT scan of the chest in 6 to 8 weeks to look for resolution of his pulmonary infiltrates. Needs dental evaluation, consider role for extractions  Plan  Continue cefpodoxime and metronidazole until  8/30 by which time he would have completed more than 2 months of antibiotics. Discussed CT chest findings about improvement and possible lagging of radiological findings compared to clinical improvement  Labs today  Fu with Pulm as planned on 8/29  # Chronic diastolic CHF, MVR s/p MV repair  - Follows with Cardiology   # Weight loss - Likely related to recent hospitalization related to complicated pna - Follow up with PCP   I have personally spent 41 minutes  involved in face-to-face and non-face-to-face activities for this patient on the day of the visit. Professional time spent includes the following activities: Preparing to see the patient (review of tests), Obtaining and/or reviewing separately obtained history (admission/discharge record), Performing a medically appropriate examination and/or evaluation , Ordering medications/tests/procedures, referring and communicating with other health care professionals, Documenting clinical information in the EMR, Independently interpreting results (not separately reported), Communicating results to the patient/family/caregiver, Counseling and educating the patient/family/caregiver and Care coordination (not separately reported).   Victoriano Lain, MD [facility] 10/27/2022, 10:34 AM

## 2022-10-28 LAB — CBC
HCT: 40.3 % (ref 38.5–50.0)
Hemoglobin: 13.3 g/dL (ref 13.2–17.1)
MCH: 31.9 pg (ref 27.0–33.0)
MCHC: 33 g/dL (ref 32.0–36.0)
MCV: 96.6 fL (ref 80.0–100.0)
MPV: 10.3 fL (ref 7.5–12.5)
Platelets: 263 10*3/uL (ref 140–400)
RBC: 4.17 10*6/uL — ABNORMAL LOW (ref 4.20–5.80)
RDW: 14.5 % (ref 11.0–15.0)
WBC: 9.1 10*3/uL (ref 3.8–10.8)

## 2022-10-28 LAB — BASIC METABOLIC PANEL
BUN: 19 mg/dL (ref 7–25)
CO2: 26 mmol/L (ref 20–32)
Calcium: 8.9 mg/dL (ref 8.6–10.3)
Chloride: 105 mmol/L (ref 98–110)
Creat: 0.98 mg/dL (ref 0.70–1.28)
Glucose, Bld: 107 mg/dL — ABNORMAL HIGH (ref 65–99)
Potassium: 4.4 mmol/L (ref 3.5–5.3)
Sodium: 137 mmol/L (ref 135–146)

## 2022-10-31 ENCOUNTER — Telehealth: Payer: Self-pay

## 2022-10-31 NOTE — Telephone Encounter (Signed)
-----   Message from Victoriano Lain sent at 10/31/2022  8:14 AM EDT ----- Please let patient know lab work is negative for any acute abnormality. Complete course of abtx as discussed in visit.

## 2022-11-03 ENCOUNTER — Telehealth: Payer: Self-pay

## 2022-11-03 MED ORDER — METRONIDAZOLE 500 MG PO TABS: 500.0000 mg | ORAL_TABLET | Freq: Two times a day (BID) | ORAL | 0 refills | Status: AC

## 2022-11-03 NOTE — Telephone Encounter (Signed)
Patient spouse left voicemail stating that patient was supposed to get refill on metronidazole at appointment last week and [facility] never received a RX.   Farryn Linares Lesli Albee, CMA

## 2022-11-03 NOTE — Addendum Note (Signed)
Addended by: Rutha Bouchard T on: 11/03/2022 04:58 PM   Modules accepted: Orders

## 2022-11-04 NOTE — Telephone Encounter (Signed)
LVM with patient's wife informing her that a refill of the flagyl has been sent and he should complete it on 8/30 along with the cefpodoxime. Kashonda Sarkisyan T Pricilla Loveless

## 2022-11-10 ENCOUNTER — Encounter: Payer: Self-pay | Admitting: Primary Care

## 2022-11-10 ENCOUNTER — Ambulatory Visit (INDEPENDENT_AMBULATORY_CARE_PROVIDER_SITE_OTHER): Payer: PPO | Admitting: Primary Care

## 2022-11-10 VITALS — BP 122/64 | HR 81 | Temp 98.3°F | Ht 69.0 in | Wt 187.4 lb

## 2022-11-10 DIAGNOSIS — J189 Pneumonia, unspecified organism: Secondary | ICD-10-CM

## 2022-11-10 DIAGNOSIS — J439 Emphysema, unspecified: Secondary | ICD-10-CM

## 2022-11-10 NOTE — Progress Notes (Signed)
@Patient  ID: Ryan Mathews, male    DOB: [birth year], 77 y.o.   MRN: [id number]  Chief Complaint  Patient presents with   Follow-up    Sob-some better, cough-green (not new), runny nose in am, denies fever    Referring provider: [name], MD  HPI:  77 year old male, former smoker.  Past medical history significant for CHF, hypertension, mitral valve disease/mitral regurgitation status post MVR, thoracic aorta arthrosclerosis, bullous emphysema, multifocal pneumonia, GERD, hepatic steatosis, skin cancer, dyslipidemia.  10/05/2022 Medication history COPD with emphysema Admitted 09/05/22- 09/11/22 for pneumonia. He is taking cefpodoxime and flagyl.  Breathing has been ok, the extreme heat does make shortness of breath worse  He is not walking as much as he used to prior to hospitalization Still has a congested cough, this has improved. Not lasting all day. He has coughing intermittent coughing spells later afternoon and at night. He is not coughing up as much phelgm.  Not using flutter valve currently. Took mucinex for 14 days and then stopped  Compliant with Incruse daily. He does not feel he needs rescue inhaler. He is not on oxygen. Biggest problem is eating, he trys to eat but has no taste. He has lost weigh.  He has been eating fruit and drinking protein drinks  Sleeping in recliner but recently started sleeping back in bed with head elevated  He had some ankle swelling last two days, took prn lasix. Swelling improved.  No fevers, chest tightness or chest pain    Tentative date for oral surgery is July 29th  CT chest scheduled is scheduled for August 1st  Follow up 2 weeks with ID on August 15     11/10/2022- interim hx  Patient presents today for follow-up multifocal pneumonia. He continues to slowly get better. Shortness of breath has improved and cough has lessened a great deal but still present. He is discouraged by how long it has taken him to get over pneumonia.  Saw infectious disease on 8/15. He has been taking cefpodoxime and metronidazole as directed, he will be finishing up abx this week. Needs repeat CT chest imaging in 2 weeks.    No Known Allergies  Immunization History  Administered Date(s) Administered   Covid-19, Mrna,Vaccine(Spikevax)39yrs and older 12/21/2021   Influenza Split 11/13/2010   Influenza Whole 12/13/2011   Influenza, High Dose Seasonal PF 12/22/2016, 01/25/2018, 11/23/2018, 11/29/2019, 12/18/2020, 12/21/2021   Influenza,inj,Quad PF,6+ Mos 12/22/2015   Influenza-Unspecified 01/16/2021   PFIZER(Purple Top)SARS-COV-2 Vaccination 04/05/2019, 04/26/2019, 12/16/2019   Pfizer Covid-19 Vaccine Bivalent Booster 60yrs & up 01/08/2021   Pneumococcal Conjugate-13 10/15/2015   Pneumococcal Polysaccharide-23 05/03/2011, 04/21/2017   Zoster, Live 01/13/2012    Past Medical History:  Diagnosis Date   Bilateral inguinal hernia (BIH) s/p lap repair 09/06/2012 08/09/2012   Cataract    CMC arthritis, thumb, degenerative    right   COPD (chronic obstructive pulmonary disease) (HCC)    Emphysema of lung (HCC)    Ex-smoker    GERD (gastroesophageal reflux disease)    History of chicken pox    History of pneumonia 08/06/2009        Hyperlipidemia    Hypertension    Nasal septal deviation 04/14/2017   Marked   Numbness    R cheek stays numb   Obesity (BMI 30-39.9) 08/09/2012   S/P MVR (mitral valve repair) 36 mm Simulus semi rigid band) via Heartport 04/29/2021 04/29/2021   S/p minimally invasive mitral valve repair (semirigid 36mm Simulus annuloplasty band) at [facility] (Dr  Gaca) 04/29/2021. Followed locally by Dr Jacinto Halim. Has been recommended dental ppx.   Echo 05/2022 - normal LVEF 55-60%, mod concentric LVH, G2DD, normal wall motion, mild-mod dilated LA, mod MR with MV annular ring repair with wall impinging MR jet but improved, mild TR (Dr Melton Alar)    Tobacco History: Social History   Tobacco Use  Smoking Status Former   Current  packs/day: 0.00   Average packs/day: 2.0 packs/day for 40.0 years (80.0 ttl pk-yrs)   Types: Cigarettes   Start date: 03/14/1968   Quit date: 03/14/2006   Years since quitting: 16.6   Passive exposure: Past  Smokeless Tobacco Never   Counseling given: Not Answered   Outpatient Medications Prior to Visit  Medication Sig Dispense Refill   acetaminophen (TYLENOL) 650 MG CR tablet Take 650 mg by mouth 2 (two) times daily.     Ascorbic Acid 500 MG CHEW Chew 1 tablet by mouth daily.     aspirin EC 81 MG tablet Take 81 mg by mouth daily. Swallow whole.     atorvastatin (LIPITOR) 20 MG tablet TAKE 1 TABLET BY MOUTH EVERY DAY 90 tablet 1   b complex vitamins tablet Take 1 tablet by mouth daily.     cefpodoxime (VANTIN) 200 MG tablet Take 200 mg by mouth 2 (two) times daily.     lisinopril (ZESTRIL) 10 MG tablet Take 1 tablet (10 mg total) by mouth at bedtime. 90 tablet 3   metroNIDAZOLE (FLAGYL) 500 MG tablet Take 1 tablet (500 mg total) by mouth 2 (two) times daily for 8 days. 16 tablet 0   Omega-3 Fatty Acids (FISH OIL) 1000 MG CAPS Take 1 capsule (1,000 mg total) by mouth daily.     saccharomyces boulardii (FLORASTOR) 250 MG capsule Take 1 capsule (250 mg total) by mouth 2 (two) times daily. 60 capsule 0   umeclidinium bromide (INCRUSE ELLIPTA) 62.5 MCG/ACT AEPB Inhale 1 puff into the lungs daily. 30 each 3   dextromethorphan (DELSYM) 30 MG/5ML liquid Take 5 mLs (30 mg total) by mouth 2 (two) times daily as needed for cough. (Patient not taking: Reported on 11/10/2022) 89 mL 0   furosemide (LASIX) 20 MG tablet Take 1 tablet (20 mg total) by mouth daily as needed for fluid or edema (weight gain 3 Lbs in 3 days). (Patient not taking: Reported on 10/27/2022) 90 tablet 3   Omeprazole-Sodium Bicarbonate (ZEGERID OTC) 20-1100 MG CAPS capsule Take 1 capsule by mouth daily before breakfast. (Patient not taking: Reported on 10/27/2022) 28 each 0   No facility-administered medications prior to visit.     Review of Systems  Review of Systems  Constitutional: Negative.   HENT: Negative.    Respiratory:  Positive for cough.   Cardiovascular: Negative.      Physical Exam  BP 122/64 (BP Location: Left Arm, Cuff Size: Normal)   Pulse 81   Temp 98.3 F (36.8 C) (Temporal)   Ht 5\' 9"  (1.753 m)   Wt 187 lb 6.4 oz (85 kg)   SpO2 99%   BMI 27.67 kg/m  Physical Exam Constitutional:      Appearance: Normal appearance.  HENT:     Head: Normocephalic and atraumatic.  Cardiovascular:     Rate and Rhythm: Normal rate and regular rhythm.  Pulmonary:     Effort: Pulmonary effort is normal.     Breath sounds: Normal breath sounds.  Neurological:     General: No focal deficit present.     Mental Status: He is  alert and oriented to person, place, and time. Mental status is at baseline.  Psychiatric:        Mood and Affect: Mood normal.        Behavior: Behavior normal.        Thought Content: Thought content normal.        Judgment: Judgment normal.      Lab Results:  CBC    Component Value Date/Time   WBC 9.1 10/27/2022 1056   RBC 4.17 (L) 10/27/2022 1056   HGB 13.3 10/27/2022 1056   HGB 18.4 (H) 02/08/2021 0908   HCT 40.3 10/27/2022 1056   HCT 53.6 (H) 02/08/2021 0908   PLT 263 10/27/2022 1056   PLT 245 02/08/2021 0908   MCV 96.6 10/27/2022 1056   MCV 93 02/08/2021 0908   MCH 31.9 10/27/2022 1056   MCHC 33.0 10/27/2022 1056   RDW 14.5 10/27/2022 1056   RDW 12.2 02/08/2021 0908   LYMPHSABS 2.1 09/21/2022 1452   MONOABS 0.9 09/21/2022 1452   EOSABS 0.5 09/21/2022 1452   BASOSABS 0.2 (H) 09/21/2022 1452    BMET    Component Value Date/Time   NA 137 10/27/2022 1056   NA 140 06/02/2022 0745   K 4.4 10/27/2022 1056   CL 105 10/27/2022 1056   CO2 26 10/27/2022 1056   GLUCOSE 107 (H) 10/27/2022 1056   BUN 19 10/27/2022 1056   BUN 26 06/02/2022 0745   CREATININE 0.98 10/27/2022 1056   CALCIUM 8.9 10/27/2022 1056   GFRNONAA >60 09/10/2022 0425   GFRAA >90  03/19/2011 0025    BNP    Component Value Date/Time   BNP 33.7 03/15/2021 2158    ProBNP    Component Value Date/Time   PROBNP 301 08/30/2022 1355   PROBNP 112.0 (H) 01/25/2021 1300    Imaging: No results found.   Assessment & Plan:   Multifocal pneumonia - Continues to slowly get better. SOB and cough are less. He will have completed 2 months course of cefpodoxime and metronidazole on 8/30. Unclear cause of organism. Needs follow-up CT imaging in 2 weeks. If not fully resolved, would recommend discussing bronchoscopy with Dr. Delton Coombes.   Bullous emphysema (HCC) - Continue Incruse Ellipta one puff daily - Continue pulmonary clearance techniques with mucinex and flutter valve.      Glenford Bayley, NP 11/12/2022

## 2022-11-10 NOTE — Patient Instructions (Signed)
Continue Incruse Ellipta one puff daily Continue flutter valve 2-3 times a day Take mucinex 600mg  twice daily as needed to loosen congestion Complete abx as directed   Orders: CT chest wo contrast in 2 weeks  Follow-up: 6-12 weeks with Dr. Judeth Horn

## 2022-11-12 NOTE — Assessment & Plan Note (Addendum)
-   Continue Incruse Ellipta one puff daily - Continue pulmonary clearance techniques with mucinex and flutter valve.

## 2022-11-12 NOTE — Assessment & Plan Note (Addendum)
-   Continues to slowly get better. SOB and cough are less. He will have completed 2 months course of cefpodoxime and metronidazole on 8/30. Unclear cause of organism. Needs follow-up CT imaging in 2 weeks. If not fully resolved, would recommend discussing bronchoscopy with Dr. Delton Coombes.

## 2022-11-24 ENCOUNTER — Ambulatory Visit: Payer: PPO | Admitting: Internal Medicine

## 2022-11-24 ENCOUNTER — Ambulatory Visit (HOSPITAL_COMMUNITY)
Admission: RE | Admit: 2022-11-24 | Discharge: 2022-11-24 | Disposition: A | Discharge status: Home / Self Care | Payer: PPO | Source: Ambulatory Visit | Attending: Primary Care | Admitting: Primary Care

## 2022-11-24 DIAGNOSIS — J189 Pneumonia, unspecified organism: Secondary | ICD-10-CM | POA: Insufficient documentation

## 2022-11-24 DIAGNOSIS — J432 Centrilobular emphysema: Secondary | ICD-10-CM | POA: Diagnosis not present

## 2022-11-24 DIAGNOSIS — I700 Atherosclerosis of aorta: Secondary | ICD-10-CM | POA: Diagnosis not present

## 2022-11-24 DIAGNOSIS — J850 Gangrene and necrosis of lung: Secondary | ICD-10-CM | POA: Diagnosis not present

## 2022-12-09 NOTE — Progress Notes (Signed)
Please let patient know CT chest showed improvement of left upper lobe lesion consistent with necrotic pneumonia. Severe emphysema. Keep apt on 10/10 with Dr. Judeth Horn

## 2022-12-22 ENCOUNTER — Encounter: Payer: Self-pay | Admitting: Pulmonary Disease

## 2022-12-22 ENCOUNTER — Ambulatory Visit: Payer: PPO | Admitting: Cardiology

## 2022-12-22 ENCOUNTER — Ambulatory Visit: Payer: PPO | Admitting: Pulmonary Disease

## 2022-12-22 VITALS — BP 139/72 | HR 73 | Temp 98.7°F | Ht 69.0 in | Wt 198.8 lb

## 2022-12-22 DIAGNOSIS — J189 Pneumonia, unspecified organism: Secondary | ICD-10-CM

## 2022-12-22 DIAGNOSIS — J439 Emphysema, unspecified: Secondary | ICD-10-CM

## 2022-12-22 MED ORDER — TRELEGY ELLIPTA 200-62.5-25 MCG/ACT IN AEPB: 1.0000 | INHALATION_SPRAY | Freq: Every day | RESPIRATORY_TRACT | Status: DC

## 2022-12-22 NOTE — Progress Notes (Signed)
@Patient  ID: Ryan Mathews, male    DOB: 1945/11/08, 77 y.o.   MRN: [id number]  Chief Complaint  Patient presents with   Follow-up    Review CT results    Referring provider: [name], MD  HPI:   77 y.o. man whom we are seeing in follow-up of pneumonia and hospitalization 08/2022.  Most recent pulmonary notes Buelah Manis, NP x 2 reviewed.  Patient got ill June 2024.  Chest pain and cough.  Treated with outpatient antibiotics.  Went to follow-up with PCP.  Chest x-ray.  Worse.  Sent to the ED.  CT scan reveals multifocal pneumonia right upper lobe with cavitation or necrosis, left lower lobe, right middle lobe.  He was treated with IV antibiotics.  Never hypoxemia.  Discharged home.  Repeat CT scan 10/2022 shows improvement on my review and interpretation in all areas with ongoing necrosis in the left upper lobe.  Repeat CT scan 11/2022 shows complete resolution in the right middle lobe left lower lobe enlarging left upper lobe with ongoing necrotic fibrosis changes.  He feels fine.  No dyspnea.  Was on Incruse.  Does not feel like it helped.  Been off for a few days.  No different.  Agreed to stop it.  HPI initial visit: Patient was in usual state of health.  Developed worsening shortness of breath.  Presented to PCP 01/25/2021.  chest x-ray obtained that day is reviewed and interpreted as increased interstitial markings as well as bilateral pleural effusions consistent with pulmonary edema and volume overload.  Concern for interstitial lung disease per radiology report.  This prompted referral.  In the interim, patient was found to have severe mitral valve regurgitation.  He was placed on Lasix.  His shortness of breath is greatly improved.  He reports a 15 pound loss of water weight.  He has upcoming consultation with cardiothoracic surgeon at [facility] for what he reports is robotic surgical repair of the mitral valve.  Reviewed CT chest 12/2019 that on my review and interpretation  reveals emphysema, no ILD or evidence of scarring.  Reviewed right heart catheterization 02/12/2021 that shows normal RA pressure, normal mean pulmonary pressure of 18, mean wedge of 12, LVEDP of 8 on the left heart catheterization.  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 Flowsheets:   ACT:      No data to display          MMRC:     No data to display          Epworth:      No data to display          Tests:   FENO:  No results found for: "NITRICOXIDE"  PFT:    Latest Ref Rng & Units 04/07/2021    3:44 PM  PFT Results  FVC-Pre L 3.65   FVC-Predicted Pre % 92   FVC-Post L 4.10   FVC-Predicted Post % 104   Pre FEV1/FVC % % 65   Post FEV1/FCV % % 62   FEV1-Pre L 2.36   FEV1-Predicted Pre % 82   FEV1-Post L 2.56   DLCO uncorrected ml/min/mmHg 13.27   DLCO UNC% % 55   DLCO corrected ml/min/mmHg 12.70   DLCO COR %Predicted % 53   DLVA Predicted % 52   TLC  L 6.44   TLC % Predicted % 97   RV % Predicted % 94   Personally reviewed interpreted as very mild fixed obstruction, lung volumes within the limits, DLCO moderately reduced  WALK:      No data to display          Imaging: Personally reviewed and as per EMR discussion this note CT Chest Wo Contrast  Result Date: 12/08/2022 CLINICAL DATA:  Pneumonia, complications suspected. Persistent cough. EXAM: CT CHEST WITHOUT CONTRAST TECHNIQUE: Multidetector CT imaging of the chest was performed following the standard protocol without IV contrast. RADIATION DOSE REDUCTION: This exam was performed according to the departmental dose-optimization program which includes automated exposure control, adjustment of the mA and/or kV according to patient size  and/or use of iterative reconstruction technique. COMPARISON:  10/13/2022 and 09/05/2022 FINDINGS: Cardiovascular: Atherosclerotic calcifications in thoracic aorta without aneurysm. Previous mitral valve surgery. Heart size is within normal limits without significant pericardial effusion. Mediastinum/Nodes: Small hiatal hernia. 9 mm subcarinal lymph node is minimally changed. Small mediastinal lymph nodes are similar to the previous examination. Again noted are multiple small prevascular lymph nodes. No significant hilar lymph node enlargement but limited evaluation without intravascular contrast. No significant axillary lymph node enlargement. Lungs/Pleura: Severe centrilobular emphysema. Again noted is a large cavitary lesion in the anterior left upper lobe involving the apex. There is mild wall thickening and difficult to exclude a small amount of fluid within this large cavitary lesion. Overall, the large cavitary lesion may be slightly decreased in size since 10/13/2022. This area roughly measures 10.2 cm in the AP dimension on image 160/7 and previously measured 12.9 cm. Again noted is architecture distortion and volume loss in left upper lobe with air bronchograms. Decreased parenchymal densities along the posterior aspect of the left upper lobe compared to the prior examination. Residual parenchymal densities along the posterior aspect of the lingula with air bronchograms. Slightly increased densities in this area on image 90/4. Persistent ground-glass opacities near the base of the left lower lobe are similar to the recent comparison examination. Parenchymal densities in the posterior right upper lobe on image 81/4 are slightly less conspicuous than the previous examination suggestive for resolving infection. Again noted are bullous emphysematous changes at the right lung apex. No pleural effusions. New area of focal parenchymal thickening or nodularity at the right lung apex measuring 6 mm image 46/4.  Another small focus of new parenchymal disease in the right upper lobe on image 65/4. Upper Abdomen: No acute abnormality in the visualized upper abdominal structures. Mild wall thickening near the distal stomach and duodenal bulb are similar to the previous examination. Musculoskeletal: No acute bone abnormality. IMPRESSION: 1. Large cavitary lesion in the left upper lobe appears to be slightly smaller in size. Findings are most likely associated with a necrotic pneumonia. Residual wall thickening associated with this large cavitary lesion. 2. Evidence for waxing and waning infectious processes in both lungs. New small areas of parenchymal disease in both lungs. 3. Severe emphysema. 4. Aortic Atherosclerosis (ICD10-I70.0) and Emphysema (ICD10-J43.9). Electronically Signed   By: Richarda Overlie M.D.   On: 12/08/2022 15:52    Lab Results: Personally reviewed CBC    Component Value Date/Time   WBC 9.1 10/27/2022 1056   RBC 4.17 (L) 10/27/2022 1056   HGB 13.3 10/27/2022 1056   HGB 18.4 (H) 02/08/2021 0908   HCT 40.3 10/27/2022 1056   HCT 53.6 (H) 02/08/2021 0908   PLT 263 10/27/2022 1056   PLT 245 02/08/2021  0908   MCV 96.6 10/27/2022 1056   MCV 93 02/08/2021 0908   MCH 31.9 10/27/2022 1056   MCHC 33.0 10/27/2022 1056   RDW 14.5 10/27/2022 1056   RDW 12.2 02/08/2021 0908   LYMPHSABS 2.1 09/21/2022 1452   MONOABS 0.9 09/21/2022 1452   EOSABS 0.5 09/21/2022 1452   BASOSABS 0.2 (H) 09/21/2022 1452    BMET    Component Value Date/Time   NA 137 10/27/2022 1056   NA 140 06/02/2022 0745   K 4.4 10/27/2022 1056   CL 105 10/27/2022 1056   CO2 26 10/27/2022 1056   GLUCOSE 107 (H) 10/27/2022 1056   BUN 19 10/27/2022 1056   BUN 26 06/02/2022 0745   CREATININE 0.98 10/27/2022 1056   CALCIUM 8.9 10/27/2022 1056   GFRNONAA >60 09/10/2022 0425   GFRAA >90 03/19/2011 0025    BNP    Component Value Date/Time   BNP 33.7 03/15/2021 2158    ProBNP    Component Value Date/Time   PROBNP 301  08/30/2022 1355   PROBNP 112.0 (H) 01/25/2021 1300    Specialty Problems       Pulmonary Problems   Bullous emphysema (HCC)    Mild - FEv1 77% Marked biapical bullous type emphysema by CT 04/2017 Diffuse bronchial wall thickening with severe centrilobular and paraseptal emphysema by CT 12/2017 CT 11/2018 - moderate to advanced centrilobular and paraseptal emphysema. Bilateral peripheral subpleural reticulation and banding with a lower lung zone predominance is again noted. No frank honeycombing      Nasal septal deviation    Marked      Dyspnea on exertion   Multifocal pneumonia   CAP (community acquired pneumonia)    No Known Allergies  Immunization History  Administered Date(s) Administered   Influenza Split 11/13/2010   Influenza Whole 12/13/2011   Influenza, High Dose Seasonal PF 12/22/2016, 01/25/2018, 11/23/2018, 11/29/2019, 12/18/2020, 12/21/2021   Influenza,inj,Quad PF,6+ Mos 12/22/2015   Influenza-Unspecified 01/16/2021, 11/28/2022   Moderna Covid-19 Fall Seasonal Vaccine 23yrs & older 12/21/2021   PFIZER(Purple Top)SARS-COV-2 Vaccination 04/05/2019, 04/26/2019, 12/16/2019   Pfizer Covid-19 Vaccine Bivalent Booster 36yrs & up 01/08/2021   Pneumococcal Conjugate-13 10/15/2015   Pneumococcal Polysaccharide-23 05/03/2011, 04/21/2017   Rsv, Bivalent, Protein Subunit Rsvpref,pf Verdis Frederickson) 12/14/2022   Zoster, Live 01/13/2012    Past Medical History:  Diagnosis Date   Bilateral inguinal hernia (BIH) s/p lap repair 09/06/2012 08/09/2012   Cataract    CMC arthritis, thumb, degenerative    right   COPD (chronic obstructive pulmonary disease) (HCC)    Emphysema of lung (HCC)    Ex-smoker    GERD (gastroesophageal reflux disease)    History of chicken pox    History of pneumonia 08/06/2009        Hyperlipidemia    Hypertension    Nasal septal deviation 04/14/2017   Marked   Numbness    R cheek stays numb   Obesity (BMI 30-39.9) 08/09/2012   S/P MVR (mitral  valve repair) 36 mm Simulus semi rigid band) via Heartport 04/29/2021 04/29/2021   S/p minimally invasive mitral valve repair (semirigid 36mm Simulus annuloplasty band) at [facility] (Dr Zebedee Iba) 04/29/2021. Followed locally by Dr Jacinto Halim. Has been recommended dental ppx.   Echo 05/2022 - normal LVEF 55-60%, mod concentric LVH, G2DD, normal wall motion, mild-mod dilated LA, mod MR with MV annular ring repair with wall impinging MR jet but improved, mild TR (Dr Melton Alar)    Tobacco History: Social History   Tobacco Use  Smoking Status Former  Current packs/day: 0.00   Average packs/day: 2.0 packs/day for 40.0 years (80.0 ttl pk-yrs)   Types: Cigarettes   Start date: 03/14/1968   Quit date: 03/14/2006   Years since quitting: 16.7   Passive exposure: Past  Smokeless Tobacco Never   Counseling given: Not Answered   Continue to not smoke  Outpatient Encounter Medications as of 12/22/2022  Medication Sig   acetaminophen (TYLENOL) 650 MG CR tablet Take 650 mg by mouth 2 (two) times daily.   Ascorbic Acid 500 MG CHEW Chew 1 tablet by mouth daily.   aspirin EC 81 MG tablet Take 81 mg by mouth daily. Swallow whole.   atorvastatin (LIPITOR) 20 MG tablet TAKE 1 TABLET BY MOUTH EVERY DAY   b complex vitamins tablet Take 1 tablet by mouth daily.   lisinopril (ZESTRIL) 10 MG tablet Take 1 tablet (10 mg total) by mouth at bedtime.   Omega-3 Fatty Acids (FISH OIL) 1000 MG CAPS Take 1 capsule (1,000 mg total) by mouth daily.   Omeprazole-Sodium Bicarbonate (ZEGERID OTC) 20-1100 MG CAPS capsule Take 1 capsule by mouth daily before breakfast.   umeclidinium bromide (INCRUSE ELLIPTA) 62.5 MCG/ACT AEPB Inhale 1 puff into the lungs daily.   cefpodoxime (VANTIN) 200 MG tablet Take 200 mg by mouth 2 (two) times daily. (Patient not taking: Reported on 12/22/2022)   dextromethorphan (DELSYM) 30 MG/5ML liquid Take 5 mLs (30 mg total) by mouth 2 (two) times daily as needed for cough. (Patient not taking: Reported on  11/10/2022)   furosemide (LASIX) 20 MG tablet Take 1 tablet (20 mg total) by mouth daily as needed for fluid or edema (weight gain 3 Lbs in 3 days). (Patient not taking: Reported on 10/27/2022)   saccharomyces boulardii (FLORASTOR) 250 MG capsule Take 1 capsule (250 mg total) by mouth 2 (two) times daily. (Patient not taking: Reported on 12/22/2022)   No facility-administered encounter medications on file as of 12/22/2022.     Review of Systems  Review of Systems  N/a Physical Exam  BP 139/72 (BP Location: Left Arm, Patient Position: Sitting, Cuff Size: Normal)   Pulse 73   Temp 98.7 F (37.1 C) (Oral)   Ht 5\' 9"  (1.753 m)   Wt 198 lb 12.8 oz (90.2 kg)   SpO2 98%   BMI 29.36 kg/m   Wt Readings from Last 5 Encounters:  12/22/22 198 lb 12.8 oz (90.2 kg)  11/10/22 187 lb 6.4 oz (85 kg)  10/27/22 184 lb (83.5 kg)  10/05/22 186 lb 9.6 oz (84.6 kg)  10/04/22 187 lb 9.6 oz (85.1 kg)    BMI Readings from Last 5 Encounters:  12/22/22 29.36 kg/m  11/10/22 27.67 kg/m  10/27/22 27.17 kg/m  10/05/22 27.56 kg/m  10/04/22 27.70 kg/m     Physical Exam General: Well-appearing, no acute distress Eyes: EOMI, icterus Neck: Supple, no JVP Cardiovascular: Heart sounds distant, warm, no edema Pulmonary: Clear, normal work of breathing Abdomen: Nondistended, bowel sounds present MSK: No synovitis, no joint effusion Neuro: Normal gait, no weakness   Assessment & Plan:   Pneumonia: Multifocal.  With signs of necrosis or cavitation in the left upper lobe.  Status post prolonged antibiotics.  Overall symptoms and imaging much improved.  No further follow-up needed.  Suspect left upper lobe will continue to cavitate and scar over time.  Very mild COPD: On PFTs 2023.  FEV1 and FVC both very high, FEV1 over 90% predicted.  Lung volumes okay.  No symptoms.  Noted met with Incruse.  Okay to stop Incruse.  Reconsider bronchodilators in the future if symptoms worsen.   Return if symptoms  worsen or fail to improve, for f/u Dr. Judeth Horn.   Karren Burly, MD 12/22/2022  I spent 41 minutes in the care of the patient today in face-to-face visit, coordination of care, review of records.

## 2022-12-22 NOTE — Patient Instructions (Addendum)
Nice to see you  Ok to stop Incruse  CT scans show ongoing improvement. Left upper lobe has ongoing scarring - will be scar left behind.  Return to clinic as needed

## 2023-01-25 ENCOUNTER — Other Ambulatory Visit: Payer: Self-pay | Admitting: Internal Medicine

## 2023-01-30 NOTE — Progress Notes (Signed)
Please consider for the Hermes HF trial with ziltivekimab sq monthly

## 2023-02-04 ENCOUNTER — Encounter: Payer: Self-pay | Admitting: Cardiology

## 2023-02-04 NOTE — Progress Notes (Unsigned)
Cardiology Office Note:  .   Date:  02/06/2023  ID:  Ryan Mathews, DOB 1945/07/19, MRN [id number] PCP: [name], MD  [facility] Providers Cardiologist:  Yates Decamp, MD   History of Present Illness: .   Ryan Mathews is a 77 y.o. Caucasian male patient with former tobacco use history, COPD, hypertension with myxomatous mitral valve and severe MR SP mitral valve repair on 04/29/2021 at [facility] presents.  Discussed the use of AI scribe software for clinical note transcription with the patient, who gave verbal consent to proceed.  History of Present Illness   Ryan Mathews is a patient with a history of mitral valve replacement and hypertension, presents for a medication refill.   He reports a recent hospitalization for pneumonia, which has since resolved. He has been monitoring his blood pressure three times a day, which has been around 130, but was 140 at today's visit, possibly due to coffee intake. He denies any shortness of breath, leg swelling, or nocturnal dyspnea.   He has been physically active, working outside, and denies any leg cramping. He does report a history of a ruptured disc, which has resulted in persistent nerve issues in his right leg. He also reports a recent fall due to his foot getting tangled in a piece of carpet, but no significant injuries were noted.      Review of Systems  Cardiovascular:  Negative for chest pain, dyspnea on exertion and leg swelling.    Labs   Lab Results  Component Value Date   CHOL 94 07/07/2021   HDL 35.10 (L) 07/07/2021   LDLCALC 37 07/07/2021   TRIG 114.0 07/07/2021   CHOLHDL 3 07/07/2021   Lab Results  Component Value Date   NA 137 10/27/2022   K 4.4 10/27/2022   CO2 26 10/27/2022   GLUCOSE 107 (H) 10/27/2022   BUN 19 10/27/2022   CREATININE 0.98 10/27/2022   CALCIUM 8.9 10/27/2022   GFR 67.89 09/14/2022   EGFR 63 06/02/2022   GFRNONAA >60 09/10/2022      Latest Ref Rng &  Units 10/27/2022   10:56 AM 10/04/2022    4:13 PM 09/14/2022   11:34 AM  BMP  Glucose 65 - 99 mg/dL 621  94  94   BUN 7 - 25 mg/dL 19  27  25    Creatinine 0.70 - 1.28 mg/dL 3.08  6.57  8.46   BUN/Creat Ratio 6 - 22 (calc) SEE NOTE:  28    Sodium 135 - 146 mmol/L 137  133  134   Potassium 3.5 - 5.3 mmol/L 4.4  4.8  4.5   Chloride 98 - 110 mmol/L 105  100  102   CO2 20 - 32 mmol/L 26  25  24    Calcium 8.6 - 10.3 mg/dL 8.9  8.8  9.5        Latest Ref Rng & Units 10/27/2022   10:56 AM 10/04/2022    4:13 PM 09/21/2022    2:52 PM  CBC  WBC 3.8 - 10.8 Thousand/uL 9.1  11.5  14.1   Hemoglobin 13.2 - 17.1 g/dL 96.2  95.2  84.1   Hematocrit 38.5 - 50.0 % 40.3  39.2  38.2   Platelets 140 - 400 Thousand/uL 263  396  409.0    Physical Exam:   VS:  BP 124/70   Pulse 63   Ht 5\' 9"  (1.753 m)   Wt 205 lb (93 kg)   SpO2 98%  BMI 30.27 kg/m    Wt Readings from Last 3 Encounters:  02/06/23 205 lb (93 kg)  12/22/22 198 lb 12.8 oz (90.2 kg)  11/10/22 187 lb 6.4 oz (85 kg)     Physical Exam Neck:     Vascular: No carotid bruit or JVD.  Cardiovascular:     Rate and Rhythm: Normal rate and regular rhythm.     Pulses:          Popliteal pulses are 2+ on the right side and 2+ on the left side.       Dorsalis pedis pulses are 0 on the right side and 0 on the left side.       Posterior tibial pulses are 0 on the right side and 0 on the left side.     Heart sounds: S1 normal and S2 normal. Murmur heard.     Holosystolic murmur is present with a grade of 3/6 at the apex.     No gallop.  Pulmonary:     Effort: Pulmonary effort is normal.     Breath sounds: Normal breath sounds.  Abdominal:     General: Bowel sounds are normal.     Palpations: Abdomen is soft.  Musculoskeletal:     Right lower leg: No edema.     Left lower leg: No edema.     Studies Reviewed: Marland Kitchen    TEE 08/31/2022: 1. Left ventricular ejection fraction, by estimation, is 60 to 65%. The left ventricle has normal function.  The left ventricle has no regional wall motion abnormalities. Left ventricular diastolic function could not be evaluated.  2. Right ventricular systolic function is normal. The right ventricular size is normal.  3. No left atrial/left atrial appendage thrombus was detected. The LAA emptying velocity was 46 cm/s.  4. A small pericardial effusion is present. The pericardial effusion is anterior to the right ventricle. There is no evidence of cardiac tamponade.  5. S/p mitral valve repair with 36mm Simulus semi rigid band. Well seated, leaflet mobility normal, moderate mitral regurgitation (multiple jets, resultant jet is eccentric along the anterior mitral leaflet) (PISA radius 0.83cm, aliasing velocity  34.6cm/sec, MR peak velocity 437cm/sec, MR VTI 108cm, ERO 0.3cm2, regurgitant volume 27mL/beat), no mitral stenosis (MG , HR 77bpm, BP 110/75), pulmonary vein flow is blunted. No evidence of mitral valve vegetation.  6. The aortic valve is normal in structure. Aortic valve regurgitation is not visualized. No aortic stenosis is present.  7. There is mild (Grade II) layered plaque involving the descending aorta.  8. Agitated saline contrast bubble study was negative, with no evidence of any interatrial shunt. Agitated saline contrast bubble study is suggestive of shunting observed after >6 cardiac cycles suggestive of intrapulmonary shunting.  9. Rhythm strip during this exam demonstrates normal sinus rhythm.   Conclusion(s)/Recommendation(s): No evidence of vegetation/infective endocarditis on this transesophageael echocardiogram.  EKG:         EKG 08/29/2022: Normal sinus rhythm at rate of 86 bpm, normal axis, incomplete right bundle branch block.  Compared to 05/26/2022, no significant change.   Medications and allergies    No Known Allergies  Current Meds  Medication Sig   acetaminophen (TYLENOL) 650 MG CR tablet Take 650 mg by mouth 2 (two) times daily.   Ascorbic Acid 500 MG CHEW Chew  1 tablet by mouth daily.   aspirin EC 81 MG tablet Take 81 mg by mouth daily. Swallow whole.   b complex vitamins tablet Take 1 tablet by mouth daily.  Omega-3 Fatty Acids (FISH OIL) 1000 MG CAPS Take 1 capsule (1,000 mg total) by mouth daily.   Omeprazole-Sodium Bicarbonate (ZEGERID OTC) 20-1100 MG CAPS capsule Take 1 capsule by mouth daily before breakfast.   [DISCONTINUED] atorvastatin (LIPITOR) 20 MG tablet TAKE 1 TABLET BY MOUTH EVERY DAY   [DISCONTINUED] cefpodoxime (VANTIN) 200 MG tablet Take 200 mg by mouth 2 (two) times daily.   [DISCONTINUED] dextromethorphan (DELSYM) 30 MG/5ML liquid Take 5 mLs (30 mg total) by mouth 2 (two) times daily as needed for cough.   [DISCONTINUED] lisinopril (ZESTRIL) 10 MG tablet Take 1 tablet (10 mg total) by mouth at bedtime.   [DISCONTINUED] saccharomyces boulardii (FLORASTOR) 250 MG capsule Take 1 capsule (250 mg total) by mouth 2 (two) times daily.   [DISCONTINUED] umeclidinium bromide (INCRUSE ELLIPTA) 62.5 MCG/ACT AEPB Inhale 1 puff into the lungs daily.     ASSESSMENT AND PLAN: .      ICD-10-CM   1. Prosthetic valve dysfunction, sequela  T82.09XS ECHOCARDIOGRAM COMPLETE    2. S/P MVR (mitral valve repair) 36 mm Simulus semi rigid band) via Heartport 04/29/2021  Z98.890 ECHOCARDIOGRAM COMPLETE    3. Primary hypertension  I10 lisinopril (ZESTRIL) 10 MG tablet    4. Indication present for endocarditis prophylaxis  Z29.89     5. Mixed hyperlipidemia  E78.2 atorvastatin (LIPITOR) 20 MG tablet      There are no diagnoses linked to this encounter.  Assessment and Plan    Prosthetic Mitral Valve No symptoms of heart failure or valve dysfunction. -Order echocardiogram in 1 year to monitor prosthetic valve function.  Hypertension Blood pressure 124/70 today. Patient reports self-adjusting Lisinopril dose. -Continue Lisinopril 10mg  daily. -Refill Lisinopril for 1 year.  Hyperlipidemia Patient is compliant with Atorvastatin. -Refill  Atorvastatin 20mg  for 1 year.  Follow-up in 1 year. Patient advised to bring medications to next visit.     I reviewed his recent hospitalization records and also the labs.  Renal function is normal, lipids well-controlled, stable hemoglobin as well.  Although he has moderate mitral regurgitation with prosthetic valve dysfunction, he remains asymptomatic, will continue annual monitoring of his mitral regurgitation.  He is aware to contact us [facility] Adult leg edema, worsening dyspnea, PND or orthopnea.  Patient is also aware of endocarditis prophylaxis.  I will refill his prescription for hypercholesterolemia.  Signed,  Yates Decamp, MD, [facility] 02/06/2023, 8:52 AM [facility] [address] [place], [place] [address] Phone: [phone]. Fax:  [phone]

## 2023-02-06 ENCOUNTER — Ambulatory Visit: Payer: PPO | Attending: Cardiology | Admitting: Cardiology

## 2023-02-06 ENCOUNTER — Encounter: Payer: Self-pay | Admitting: Cardiology

## 2023-02-06 VITALS — BP 124/70 | HR 63 | Ht 69.0 in | Wt 205.0 lb

## 2023-02-06 DIAGNOSIS — L821 Other seborrheic keratosis: Secondary | ICD-10-CM | POA: Diagnosis not present

## 2023-02-06 DIAGNOSIS — I10 Essential (primary) hypertension: Secondary | ICD-10-CM | POA: Diagnosis not present

## 2023-02-06 DIAGNOSIS — T8209XS Other mechanical complication of heart valve prosthesis, sequela: Secondary | ICD-10-CM

## 2023-02-06 DIAGNOSIS — L814 Other melanin hyperpigmentation: Secondary | ICD-10-CM | POA: Diagnosis not present

## 2023-02-06 DIAGNOSIS — L578 Other skin changes due to chronic exposure to nonionizing radiation: Secondary | ICD-10-CM | POA: Diagnosis not present

## 2023-02-06 DIAGNOSIS — L570 Actinic keratosis: Secondary | ICD-10-CM | POA: Diagnosis not present

## 2023-02-06 DIAGNOSIS — E782 Mixed hyperlipidemia: Secondary | ICD-10-CM | POA: Diagnosis not present

## 2023-02-06 DIAGNOSIS — Z86007 Personal history of in-situ neoplasm of skin: Secondary | ICD-10-CM | POA: Diagnosis not present

## 2023-02-06 DIAGNOSIS — T8209XD Other mechanical complication of heart valve prosthesis, subsequent encounter: Secondary | ICD-10-CM

## 2023-02-06 DIAGNOSIS — D2272 Melanocytic nevi of left lower limb, including hip: Secondary | ICD-10-CM | POA: Diagnosis not present

## 2023-02-06 DIAGNOSIS — Z9889 Other specified postprocedural states: Secondary | ICD-10-CM

## 2023-02-06 DIAGNOSIS — Z2989 Encounter for other specified prophylactic measures: Secondary | ICD-10-CM | POA: Diagnosis not present

## 2023-02-06 DIAGNOSIS — D1801 Hemangioma of skin and subcutaneous tissue: Secondary | ICD-10-CM | POA: Diagnosis not present

## 2023-02-06 MED ORDER — LISINOPRIL 10 MG PO TABS
10.0000 mg | ORAL_TABLET | Freq: Every day | ORAL | 3 refills | Status: DC
Start: 2023-02-06 — End: 2024-02-01

## 2023-02-06 MED ORDER — ATORVASTATIN CALCIUM 20 MG PO TABS
20.0000 mg | ORAL_TABLET | Freq: Every day | ORAL | 3 refills | Status: DC
Start: 2023-02-06 — End: 2024-02-01

## 2023-02-06 NOTE — Patient Instructions (Signed)
Medication Instructions:  Your physician recommends that you continue on your current medications as directed. Please refer to the Current Medication list given to you today.  *If you need a refill on your cardiac medications before your next appointment, please call your pharmacy*   Lab Work: none If you have labs (blood work) drawn today and your tests are completely normal, you will receive your results only by: MyChart Message (if you have MyChart) OR A paper copy in the mail If you have any lab test that is abnormal or we need to change your treatment, we will call you to review the results.   Testing/Procedures: Your physician has requested that you have an echocardiogram. Echocardiography is a painless test that uses sound waves to create images of your heart. It provides your doctor with information about the size and shape of your heart and how well your heart's chambers and valves are working. This procedure takes approximately one hour. There are no restrictions for this procedure. Please do NOT wear cologne, perfume, aftershave, or lotions (deodorant is allowed). Please arrive 15 minutes prior to your appointment time.  Please note: We ask at that you not bring children with you during ultrasound (echo/ vascular) testing. Due to room size and safety concerns, children are not allowed in the ultrasound rooms during exams. Our front office staff cannot provide observation of children in our lobby area while testing is being conducted. An adult accompanying a patient to their appointment will only be allowed in the ultrasound room at the discretion of the ultrasound technician under special circumstances. We apologize for any inconvenience.  To be done in one year   Follow-Up: At [facility], you and your health needs are our priority.  As part of our continuing mission to provide you with exceptional heart care, we have created designated Provider Care Teams.  These Care  Teams include your primary Cardiologist (physician) and Advanced Practice Providers (APPs -  Physician Assistants and Nurse Practitioners) who all work together to provide you with the care you need, when you need it.  We recommend signing up for the patient portal called "MyChart".  Sign up information is provided on this After Visit Summary.  MyChart is used to connect with patients for Virtual Visits (Telemedicine).  Patients are able to view lab/test results, encounter notes, upcoming appointments, etc.  Non-urgent messages can be sent to your provider as well.   To learn more about what you can do with MyChart, go to [email or web address].    Your next appointment:   12 month(s)  Provider:   Yates Decamp, MD     Other Instructions

## 2023-04-06 ENCOUNTER — Telehealth: Payer: Self-pay

## 2023-04-06 DIAGNOSIS — Z2989 Encounter for other specified prophylactic measures: Secondary | ICD-10-CM

## 2023-04-06 DIAGNOSIS — J432 Centrilobular emphysema: Secondary | ICD-10-CM

## 2023-04-06 NOTE — Telephone Encounter (Signed)
Received faxed medical clearance form from [facility]. Pt needs cleaning, filling/crown and local anesthetic w/epi.   Placed form in Dr Timoteo Expose box.

## 2023-04-14 DIAGNOSIS — Z2989 Encounter for other specified prophylactic measures: Secondary | ICD-10-CM | POA: Insufficient documentation

## 2023-04-14 DIAGNOSIS — J449 Chronic obstructive pulmonary disease, unspecified: Secondary | ICD-10-CM | POA: Insufficient documentation

## 2023-04-14 MED ORDER — AMOXICILLIN 500 MG PO CAPS: 2000.0000 mg | ORAL_CAPSULE | Freq: Once | ORAL | 1 refills | Status: AC

## 2023-04-14 NOTE — Telephone Encounter (Signed)
Received request from [facility]  for medical clearance around upcoming dental procedure: cleaning, filling and crown under local anesthesia.   Current blood thinner: aspirin 81mg  daily Indication for anticoagulation: s/p mitral valve repair 04/2021.  Bleeding risk of procedure: low risk  Recommend: continue aspirin 81mg  daily. Do recommend dental prophylaxis with amoxicillin 2gm 1 hour prior to procedure - ordered.   Plz notify pt.  Form placed in Lisa's box.   I printed out snapshot - Misty Stanley do you know of a better way to print out problem list?

## 2023-04-14 NOTE — Addendum Note (Signed)
Addended by: [name] on: 04/14/2023 01:53 PM   Modules accepted: Orders

## 2023-04-14 NOTE — Telephone Encounter (Signed)
Faxed form to [facility] at [phone].  Spoke with pt relaying Dr Timoteo Expose message. Pt verbalizes understanding.

## 2023-05-11 ENCOUNTER — Other Ambulatory Visit: Payer: Self-pay | Admitting: Infectious Diseases

## 2023-06-05 ENCOUNTER — Telehealth: Payer: Self-pay | Admitting: *Deleted

## 2023-06-05 NOTE — Telephone Encounter (Signed)
   Pre-operative Risk Assessment    Patient Name: Ryan Mathews  DOB: Sep 29, 1945 MRN: [id number]   Date of last office visit: 02/06/23 DR. Jacinto Halim Date of next office visit: NONE  Request for Surgical Clearance    Procedure:   5 DENTAL FILLINGS  Date of Surgery:  Clearance TBD                                Surgeon:  DR. [place] Surgeon's Group or Practice Name:  [facility]  Phone number:  [phone] Fax number:  [phone]   Type of Clearance Requested:   - Medical  - Pharmacy:  Hold Aspirin     Type of Anesthesia:  Local    Additional requests/questions:    [name]   06/05/2023, 5:18 PM

## 2023-06-06 MED ORDER — AMOXICILLIN 500 MG PO CAPS: 2000.0000 mg | ORAL_CAPSULE | Freq: Once | ORAL | 0 refills | Status: AC

## 2023-06-06 NOTE — Addendum Note (Signed)
 Addended by: Carlos Levering on: 06/06/2023 10:22 AM   Modules accepted: Orders

## 2023-06-06 NOTE — Telephone Encounter (Addendum)
   Patient Name: Ryan Mathews  DOB: March 05, 1946 MRN: [id number]  Primary Cardiologist: Yates Decamp, MD  Chart reviewed as part of pre-operative protocol coverage.   Dental filings are considered low risk procedures per guidelines and generally do not require any specific cardiac clearance. It is also generally accepted that for dental fillings and dental cleanings, there is no need to interrupt blood thinner therapy.  SBE prophylaxis is required for the patient from a cardiac standpoint. Rx sent to pharmacy.   I will route this recommendation to the requesting party via Epic fax function and remove from pre-op pool.  Please call with questions.  Carlos Levering, NP 06/06/2023, 10:11 AM

## 2023-06-07 ENCOUNTER — Telehealth: Payer: Self-pay | Admitting: *Deleted

## 2023-06-07 NOTE — Telephone Encounter (Signed)
 Copied from CRM [id number]. Topic: Clinical - Home Health Verbal Orders >> Jun 07, 2023  1:24 PM Adele Barthel wrote: Caller/Agency: Myra, [facility] Callback Number: [phone], FX# [id number] Service Requested: Received medical clearance form back from provider; however, was not filled out completely.   Needs to answer #4 on the form  Make sure provider circles Yes or No to indicate whether he is cleared for treatment.   Will need form sent back with provider's signature once completed.   Any new concerns about the patient? No   I called [facility] yesterday 06/06/22 and spoke with receptionist and explained to her that at last ov (12/22/22) with pt we told him to f/u here PRN only. I was advised that clearance was no longer needed from [place].   Called back [facility] and had to leave msg. Will await a call back.

## 2023-06-07 NOTE — Telephone Encounter (Signed)
 Patient is low risk for a low risk procedure. No visit is needed. Given lack of true surgery, no true surgical risk assessment can be provided.  Thanks!

## 2023-06-07 NOTE — Telephone Encounter (Signed)
 Spoke with Lanora Manis with [facility]  She states that the pt needs pulm clearance for filling and crown, dental cleaning under local anesthetic with epi   Dr Judeth Horn- are you ok with addending last ov or can we get him worked in for clearance  Last ov was told f.u prn only

## 2023-06-08 NOTE — Telephone Encounter (Signed)
 Copy of this encounter was faxed to [facility] and fax confirmation was received.

## 2023-07-13 ENCOUNTER — Ambulatory Visit: Payer: PPO

## 2023-07-13 VITALS — Ht 69.0 in | Wt 205.0 lb

## 2023-07-13 DIAGNOSIS — Z0000 Encounter for general adult medical examination without abnormal findings: Secondary | ICD-10-CM | POA: Diagnosis not present

## 2023-07-13 NOTE — Patient Instructions (Signed)
 Ryan Mathews , Thank you for taking time to come for your Medicare Wellness Visit. I appreciate your ongoing commitment to your health goals. Please review the following plan we discussed and let me know if I can assist you in the future.   Referrals/Orders/Follow-Ups/Clinician Recommendations: none  This is a list of the screening recommended for you and due dates:  Health Maintenance  Topic Date Due   Zoster (Shingles) Vaccine (1 of 2) 03/20/1966   DTaP/Tdap/Td vaccine (2 - Td or Tdap) 03/14/2022   COVID-19 Vaccine (6 - 2024-25 season) 11/13/2022   Flu Shot  10/13/2023   Medicare Annual Wellness Visit  07/12/2024   Colon Cancer Screening  05/11/2025   Pneumonia Vaccine  Completed   Hepatitis C Screening  Completed   HPV Vaccine  Aged Out   Meningitis B Vaccine  Aged Out    Advanced directives: (Copy Requested) Please bring a copy of your health care power of attorney and living will to the office to be added to your chart at your convenience. You can mail to [facility] [address] [place], [place] [address] or email to [email or web address]  Next Medicare Annual Wellness Visit scheduled for next year: Yes 07/16/24 @ 8:10am televisit

## 2023-07-13 NOTE — Progress Notes (Signed)
 Please attest and cosign this visit due to patients primary care provider not being in the office at the time the visit was completed.    Subjective:   Ryan Mathews is a 78 y.o. who presents for a Medicare Wellness preventive visit.  Visit Complete: Virtual I connected with  Ryan Mathews on 07/13/23 by a audio enabled telemedicine application and verified that I am speaking with the correct person using two identifiers.  Patient Location: Home  Provider Location: Office/Clinic  I discussed the limitations of evaluation and management by telemedicine. The patient expressed understanding and agreed to proceed.  Vital Signs: Because this visit was a virtual/telehealth visit, some criteria may be missing or patient reported. Any vitals not documented were not able to be obtained and vitals that have been documented are patient reported.  Persons Participating in Visit: Patient.  AWV Questionnaire: Yes: Patient Medicare AWV questionnaire was completed by the patient on 07/06/23; I have confirmed that all information answered by patient is correct and no changes since this date.  Cardiac Risk Factors include: advanced age (>46men, >33 women);dyslipidemia;male gender;hypertension;obesity (BMI >30kg/m2)    Objective:    Today's Vitals   07/13/23 0815  Weight: 205 lb (93 kg)  Height: 5\' 9"  (1.753 m)   Body mass index is 30.27 kg/m.     07/13/2023    8:25 AM 09/05/2022    6:09 PM 07/11/2022   10:45 AM 07/07/2021   10:35 AM 03/15/2021    9:54 PM 02/16/2021    8:57 AM 02/12/2021    6:37 AM  Advanced Directives  Does Patient Have a Medical Advance Directive? Yes No Yes Yes No No No  Type of Estate agent of [place];Living will  Healthcare Power of [place];Living will Healthcare Power of [place];Living will     Copy of Healthcare Power of Attorney in Chart? No - copy requested  No - copy requested No - copy requested     Would patient like information on  creating a medical advance directive?  No - Patient declined    No - Patient declined No - Patient declined    Current Medications (verified) Outpatient Encounter Medications as of 07/13/2023  Medication Sig   acetaminophen  (TYLENOL ) 650 MG CR tablet Take 650 mg by mouth 2 (two) times daily.   Ascorbic Acid  500 MG CHEW Chew 1 tablet by mouth daily.   aspirin  EC 81 MG tablet Take 81 mg by mouth daily. Swallow whole.   atorvastatin  (LIPITOR) 20 MG tablet Take 1 tablet (20 mg total) by mouth daily.   b complex vitamins tablet Take 1 tablet by mouth daily.   lisinopril  (ZESTRIL ) 10 MG tablet Take 1 tablet (10 mg total) by mouth at bedtime.   Omega-3 Fatty Acids (FISH OIL ) 1000 MG CAPS Take 1 capsule (1,000 mg total) by mouth daily.   Omeprazole -Sodium Bicarbonate  (ZEGERID  OTC) 20-1100 MG CAPS capsule Take 1 capsule by mouth daily before breakfast.   No facility-administered encounter medications on file as of 07/13/2023.    Allergies (verified) Patient has no known allergies.   History: Past Medical History:  Diagnosis Date   Acute midline low back pain with right-sided sciatica 08/14/2020   L MRI: Large right central/subarticular superiorly migrating disc extrusion at L4-5 resulting in severe spinal canal stenosis and likely impinging on the exiting right L4 and traversing right L5 nerve roots.  Established with NSG (Dr Dawley) rec L4/5 microdiskectomy      Bilateral inguinal hernia (BIH) s/p  lap repair 09/06/2012 08/09/2012   Cataract    CMC arthritis, thumb, degenerative    right   COPD (chronic obstructive pulmonary disease) (HCC)    Emphysema of lung (HCC)    Ex-smoker    GERD (gastroesophageal reflux disease)    History of chicken pox    History of pneumonia 08/06/2009        Hyperlipidemia    Hypertension    Nasal septal deviation 04/14/2017   Marked   Numbness    R cheek stays numb   Obesity (BMI 30-39.9) 08/09/2012   S/P MVR (mitral valve repair) 36 mm Simulus semi rigid  band) via [facility] 04/29/2021 04/29/2021   S/p minimally invasive mitral valve repair (semirigid 36mm Simulus annuloplasty band) at [facility] (Dr Mindi Alto) 04/29/2021. Followed locally by Dr Berry Bristol. Has been recommended dental ppx.   Echo 05/2022 - normal LVEF 55-60%, mod concentric LVH, G2DD, normal wall motion, mild-mod dilated LA, mod MR with MV annular ring repair with wall impinging MR jet but improved, mild TR (Dr Braxton Calico)   Past Surgical History:  Procedure Laterality Date   ANKLE SURGERY Left 1966   with tendon repair with stainless steel wire   CARDIAC CATHETERIZATION     COLONOSCOPY  07/2017   multiple tubular adenomas, rpt 3 yrs (Pyrtle)   INSERTION OF MESH Bilateral 09/06/2012   Procedure: INSERTION OF MESH;  Surgeon: Eddye Goodie, MD   LAPAROSCOPIC INGUINAL HERNIA WITH UMBILICAL HERNIA Bilateral 09/06/2012   Procedure: LAPAROSCOPIC exploration and repair of hernias in bellybutton and bilateral groins with mesh;  Surgeon: Eddye Goodie, MD   MICRODISCECTOMY LUMBAR Right 08/2020   L4/5 (Dr Ali Antonio)   MITRAL VALVE REPAIR  04/29/2021   at [facility] Dr Mindi Alto   RIGHT/LEFT HEART CATH AND CORONARY ANGIOGRAPHY N/A 02/16/2021   Procedure: RIGHT/LEFT HEART CATH AND CORONARY ANGIOGRAPHY;  Surgeon: Knox Perl, MD;  Location: [facility] INVASIVE CV LAB;  Service: Cardiovascular;  Laterality: N/A;   TEE WITHOUT CARDIOVERSION N/A 02/12/2021   Procedure: TRANSESOPHAGEAL ECHOCARDIOGRAM (TEE);  Surgeon: Knox Perl, MD;  Location: [facility] ENDOSCOPY;  Service: Cardiovascular;  Laterality: N/A;   TEE WITHOUT CARDIOVERSION N/A 08/31/2022   Procedure: TRANSESOPHAGEAL ECHOCARDIOGRAM;  Surgeon: Olinda Bertrand, DO;  Location: [facility] INVASIVE CV LAB;  Service: Cardiovascular;  Laterality: N/A;   Family History  Problem Relation Age of Onset   Cancer Mother        breast   Congenital heart disease Mother    Diabetes Mother    COPD Father    Cancer Sister        lung (minimal smoker)   Colon cancer Neg Hx    Esophageal cancer  Neg Hx    Liver cancer Neg Hx    Rectal cancer Neg Hx    Stomach cancer Neg Hx    Pancreatic cancer Neg Hx    Social History   Socioeconomic History   Marital status: Married    Spouse name: Not on file   Number of children: 4   Years of education: 12   Highest education level: Not on file  Occupational History   Not on file  Tobacco Use   Smoking status: Former    Current packs/day: 0.00    Average packs/day: 2.0 packs/day for 40.0 years (80.0 ttl pk-yrs)    Types: Cigarettes    Start date: 03/14/1968    Quit date: 03/14/2006    Years since quitting: 17.3    Passive exposure: Past   Smokeless tobacco: Never  Vaping Use  Vaping status: Never Used  Substance and Sexual Activity   Alcohol use: Not Currently    Comment: Occasionally   Drug use: No   Sexual activity: Yes  Other Topics Concern   Not on file  Social History Narrative   Caffeine: 2 coffee, 2 tea/day   Lives with wife and 4 cats and 1 dog, grown children   Occupation: Radio broadcast assistant   Edu: HS   Activity: stays active at work and in garden   Diet: some water, fruits/vegetables daily   Social Drivers of Corporate investment banker Strain: Low Risk  (07/13/2023)   Overall Financial Resource Strain (CARDIA)    Difficulty of Paying Living Expenses: Not hard at all  Food Insecurity: No Food Insecurity (07/13/2023)   Hunger Vital Sign    Worried About Running Out of Food in the Last Year: Never true    Ran Out of Food in the Last Year: Never true  Transportation Needs: No Transportation Needs (07/13/2023)   PRAPARE - Administrator, Civil Service (Medical): No    Lack of Transportation (Non-Medical): No  Physical Activity: Insufficiently Active (07/13/2023)   Exercise Vital Sign    Days of Exercise per Week: 4 days    Minutes of Exercise per Session: 30 min  Stress: No Stress Concern Present (07/13/2023)   [employer] of Occupational Health - Occupational Stress Questionnaire    Feeling of Stress  : Not at all  Social Connections: Moderately Isolated (07/13/2023)   Social Connection and Isolation Panel [NHANES]    Frequency of Communication with Friends and Family: More than three times a week    Frequency of Social Gatherings with Friends and Family: More than three times a week    Attends Religious Services: Never    Database administrator or Organizations: No    Attends Engineer, structural: Never    Marital Status: Married    Tobacco Counseling Counseling given: Not Answered    Clinical Intake:  Pre-visit preparation completed: Yes  Pain : No/denies pain     BMI - recorded: 30.27 Nutritional Status: BMI > 30  Obese Nutritional Risks: None Diabetes: No  No results found for: "HGBA1C"   How often do you need to have someone help you when you read instructions, pamphlets, or other written materials from your doctor or pharmacy?: 1 - Never  Interpreter Needed?: No  Comments: lives with wife Information entered by :: B.Ivionna Verley,LPN   Activities of Daily Living     07/06/2023    6:17 PM 09/06/2022    2:25 AM  In your present state of health, do you have any difficulty performing the following activities:  Hearing? 0 0  Vision? 0 0  Difficulty concentrating or making decisions? 0 0  Walking or climbing stairs? 0 0  Dressing or bathing? 0 0  Doing errands, shopping? 0 0  Preparing Food and eating ? N   Using the Toilet? N   In the past six months, have you accidently leaked urine? N   Do you have problems with loss of bowel control? N   Managing your Medications? N   Managing your Finances? N   Housekeeping or managing your Housekeeping? N     Patient Care Team: Claire Crick, MD as PCP - General (Family Medicine) Knox Perl, MD as PCP - Cardiology (Cardiology) Lind Repine, MD as Consulting Physician (Pulmonary Disease) Dema Filler, MD as Consulting Physician (Ophthalmology)  Indicate any recent Medical Services  you may have  received from other than [facility] providers in the past year (date may be approximate).     Assessment:   This is a routine wellness examination for [facility].  Hearing/Vision screen Hearing Screening - Comments:: Pt says his hearing is good Vision Screening - Comments:: Pt says his vision is good w/glasses for reading only Dr Juanito Norma   Goals Addressed             This Visit's Progress    Patient Stated   On track    07/13/23- I will continue to sleep at least 7 hours daily.      Patient Stated   On track    07/13/23- I will maintain and continue medications as prescribed.      Patient Stated   On track    07/13/23-Keep weight down.       Depression Screen     07/13/2023    8:22 AM 10/27/2022   10:34 AM 10/04/2022    3:42 PM 09/14/2022   11:28 AM 09/05/2022    3:52 PM 08/09/2022   10:54 AM 07/11/2022   10:44 AM  PHQ 2/9 Scores  PHQ - 2 Score 0 0 0 0 0 0 0  PHQ- 9 Score    2 6      Fall Risk     07/06/2023    6:17 PM 10/27/2022   10:34 AM 10/04/2022    3:42 PM 09/14/2022   11:28 AM 09/05/2022    3:52 PM  Fall Risk   Falls in the past year? 0 0 0 0 0  Number falls in past yr: 0 0 0    Injury with Fall? 0 0 0    Risk for fall due to : No Fall Risks      Follow up Education provided;Falls prevention discussed        MEDICARE RISK AT HOME:  Medicare Risk at Home Any stairs in or around the home?: (Patient-Rptd) Yes If so, are there any without handrails?: (Patient-Rptd) No Home free of loose throw rugs in walkways, pet beds, electrical cords, etc?: (Patient-Rptd) Yes Adequate lighting in your home to reduce risk of falls?: (Patient-Rptd) Yes Life alert?: (Patient-Rptd) No Use of a cane, walker or w/c?: (Patient-Rptd) No Grab bars in the bathroom?: (Patient-Rptd) No Shower chair or bench in shower?: (Patient-Rptd) Yes Elevated toilet seat or a handicapped toilet?: (Patient-Rptd) No  TIMED UP AND GO:  Was the test performed?  No  Cognitive Function: 6CIT completed     05/08/2020    2:03 PM 05/04/2018   11:09 AM 10/15/2015    9:40 AM  MMSE - Mini Mental State Exam  Not completed: Refused    Orientation to time  5 5  Orientation to Place  5 5  Registration  3 3  Attention/ Calculation  0 0  Recall  1 3  Recall-comments  unable to recall 2 of 3 words   Language- name 2 objects  0 0  Language- repeat  1 1  Language- follow 3 step command  3 3  Language- read & follow direction  0 0  Write a sentence  0 0  Copy design  0 0  Total score  18 20        07/13/2023    8:27 AM 07/11/2022   10:46 AM 07/07/2021   10:38 AM  6CIT Screen  What Year? 0 points 0 points 0 points  What month? 0 points 0 points 0 points  What time? 0 points 0 points 0 points  Count back from 20 0 points 0 points 0 points  Months in reverse 0 points 0 points 0 points  Repeat phrase 0 points 0 points 0 points  Total Score 0 points 0 points 0 points    Immunizations Immunization History  Administered Date(s) Administered   Influenza Split 11/13/2010   Influenza Whole 12/13/2011   Influenza, High Dose Seasonal PF 12/22/2016, 01/25/2018, 11/23/2018, 11/29/2019, 12/18/2020, 12/21/2021   Influenza,inj,Quad PF,6+ Mos 12/22/2015   Influenza-Unspecified 01/16/2021, 11/28/2022   Moderna Covid-19 Fall Seasonal Vaccine 26yrs & older 12/21/2021   PFIZER(Purple Top)SARS-COV-2 Vaccination 04/05/2019, 04/26/2019, 12/16/2019   Pfizer Covid-19 Vaccine Bivalent Booster 50yrs & up 01/08/2021   Pneumococcal Conjugate-13 10/15/2015   Pneumococcal Polysaccharide-23 05/03/2011, 04/21/2017   Rsv, Bivalent, Protein Subunit Rsvpref,pf Pattricia Bores) 12/14/2022   Zoster, Live 01/13/2012    Screening Tests Health Maintenance  Topic Date Due   Zoster Vaccines- Shingrix (1 of 2) 03/20/1966   DTaP/Tdap/Td (2 - Td or Tdap) 03/14/2022   COVID-19 Vaccine (6 - 2024-25 season) 11/13/2022   INFLUENZA VACCINE  10/13/2023   Medicare Annual Wellness (AWV)  07/12/2024   Colonoscopy  05/11/2025   Pneumonia  Vaccine 70+ Years old  Completed   Hepatitis C Screening  Completed   HPV VACCINES  Aged Out   Meningococcal B Vaccine  Aged Out    Health Maintenance  Health Maintenance Due  Topic Date Due   Zoster Vaccines- Shingrix (1 of 2) 03/20/1966   DTaP/Tdap/Td (2 - Td or Tdap) 03/14/2022   COVID-19 Vaccine (6 - 2024-25 season) 11/13/2022   Health Maintenance Items Addressed: Pt inquires IF he should get the Covid vaccine. Note to Pcp as pt has some other issues going on  Additional Screening:  Vision Screening: Recommended annual ophthalmology exams for early detection of glaucoma and other disorders of the eye.  Dental Screening: Recommended annual dental exams for proper oral hygiene  Community Resource Referral / Chronic Care Management: CRR required this visit?  No   CCM required this visit?  Appt scheduled with PCP     Plan:     I have personally reviewed and noted the following in the patient's chart:   Medical and social history Use of alcohol, tobacco or illicit drugs  Current medications and supplements including opioid prescriptions. Patient is not currently taking opioid prescriptions. Functional ability and status Nutritional status Physical activity Advanced directives List of other physicians Hospitalizations, surgeries, and ER visits in previous 12 months Vitals Screenings to include cognitive, depression, and falls Referrals and appointments  In addition, I have reviewed and discussed with patient certain preventive protocols, quality metrics, and best practice recommendations. A written personalized care plan for preventive services as well as general preventive health recommendations were provided to patient.     Nerissa Bannister, LPN   11/17/452   After Visit Summary: (MyChart) Due to this being a telephonic visit, the after visit summary with patients personalized plan was offered to patient via MyChart   Notes: Nothing significant to report at  this time.

## 2023-07-31 DIAGNOSIS — Z961 Presence of intraocular lens: Secondary | ICD-10-CM | POA: Diagnosis not present

## 2023-10-17 ENCOUNTER — Encounter: Payer: Self-pay | Admitting: Family Medicine

## 2023-10-17 ENCOUNTER — Ambulatory Visit (INDEPENDENT_AMBULATORY_CARE_PROVIDER_SITE_OTHER): Admitting: Family Medicine

## 2023-10-17 VITALS — BP 134/80 | HR 55 | Temp 98.2°F | Ht 67.0 in | Wt 208.4 lb

## 2023-10-17 DIAGNOSIS — E785 Hyperlipidemia, unspecified: Secondary | ICD-10-CM | POA: Diagnosis not present

## 2023-10-17 DIAGNOSIS — J439 Emphysema, unspecified: Secondary | ICD-10-CM | POA: Diagnosis not present

## 2023-10-17 DIAGNOSIS — Z9889 Other specified postprocedural states: Secondary | ICD-10-CM

## 2023-10-17 DIAGNOSIS — K219 Gastro-esophageal reflux disease without esophagitis: Secondary | ICD-10-CM | POA: Diagnosis not present

## 2023-10-17 DIAGNOSIS — Z7189 Other specified counseling: Secondary | ICD-10-CM

## 2023-10-17 DIAGNOSIS — Z2989 Encounter for other specified prophylactic measures: Secondary | ICD-10-CM

## 2023-10-17 DIAGNOSIS — Z0000 Encounter for general adult medical examination without abnormal findings: Secondary | ICD-10-CM | POA: Insufficient documentation

## 2023-10-17 DIAGNOSIS — Z87891 Personal history of nicotine dependence: Secondary | ICD-10-CM

## 2023-10-17 DIAGNOSIS — K760 Fatty (change of) liver, not elsewhere classified: Secondary | ICD-10-CM | POA: Diagnosis not present

## 2023-10-17 DIAGNOSIS — Z125 Encounter for screening for malignant neoplasm of prostate: Secondary | ICD-10-CM

## 2023-10-17 DIAGNOSIS — I5032 Chronic diastolic (congestive) heart failure: Secondary | ICD-10-CM | POA: Diagnosis not present

## 2023-10-17 LAB — COMPREHENSIVE METABOLIC PANEL WITH GFR
ALT: 17 U/L (ref 0–53)
AST: 27 U/L (ref 0–37)
Albumin: 4.4 g/dL (ref 3.5–5.2)
Alkaline Phosphatase: 67 U/L (ref 39–117)
BUN: 16 mg/dL (ref 6–23)
CO2: 29 meq/L (ref 19–32)
Calcium: 9.3 mg/dL (ref 8.4–10.5)
Chloride: 104 meq/L (ref 96–112)
Creatinine, Ser: 1.05 mg/dL (ref 0.40–1.50)
GFR: 68.92 mL/min (ref >60.00)
Glucose, Bld: 93 mg/dL (ref 70–99)
Potassium: 4.2 meq/L (ref 3.5–5.1)
Sodium: 139 meq/L (ref 135–145)
Total Bilirubin: 0.7 mg/dL (ref 0.2–1.2)
Total Protein: 7.1 g/dL (ref 6.0–8.3)

## 2023-10-17 LAB — CBC WITH DIFFERENTIAL/PLATELET
Basophils Absolute: 0.1 K/uL (ref 0.0–0.1)
Basophils Relative: 1 % (ref 0.0–3.0)
Eosinophils Absolute: 0.4 K/uL (ref 0.0–0.7)
Eosinophils Relative: 5 % (ref 0.0–5.0)
HCT: 45.4 % (ref 39.0–52.0)
Hemoglobin: 15.3 g/dL (ref 13.0–17.0)
Lymphocytes Relative: 26 % (ref 12.0–46.0)
Lymphs Abs: 2.1 K/uL (ref 0.7–4.0)
MCHC: 33.7 g/dL (ref 30.0–36.0)
MCV: 96.2 fl (ref 78.0–100.0)
Monocytes Absolute: 0.7 K/uL (ref 0.1–1.0)
Monocytes Relative: 9.2 % (ref 3.0–12.0)
Neutro Abs: 4.8 K/uL (ref 1.4–7.7)
Neutrophils Relative %: 58.8 % (ref 43.0–77.0)
Platelets: 172 K/uL (ref 150.0–400.0)
RBC: 4.72 Mil/uL (ref 4.22–5.81)
RDW: 13.8 % (ref 11.5–15.5)
WBC: 8.1 K/uL (ref 4.0–10.5)

## 2023-10-17 LAB — LIPID PANEL
Cholesterol: 97 mg/dL (ref 0–200)
HDL: 37.5 mg/dL — ABNORMAL LOW (ref >39.00)
LDL Cholesterol: 34 mg/dL (ref 0–99)
NonHDL: 59.74
Total CHOL/HDL Ratio: 3
Triglycerides: 129 mg/dL (ref 0.0–149.0)
VLDL: 25.8 mg/dL (ref 0.0–40.0)

## 2023-10-17 LAB — TSH: TSH: 2.71 u[IU]/mL (ref 0.35–5.50)

## 2023-10-17 LAB — PSA: PSA: 1.62 ng/mL (ref 0.10–4.00)

## 2023-10-17 MED ORDER — OMEPRAZOLE-SODIUM BICARBONATE 20-1100 MG PO CAPS: 1.0000 | ORAL_CAPSULE | Freq: Every day | ORAL | Status: AC

## 2023-10-17 NOTE — Assessment & Plan Note (Signed)
 Seems euvolemic.

## 2023-10-17 NOTE — Patient Instructions (Addendum)
 Labs today  Consider Prevnar-20  - double check with pharmacy on what you've recently gotten.  Bring us  a copy of your living will/advanced directive Consider stopping fish oil  Good to see you today Return in 1 year for next physical/wellness visit

## 2023-10-17 NOTE — Assessment & Plan Note (Signed)
 Previously noted on imaging. Update LFT, CBC

## 2023-10-17 NOTE — Assessment & Plan Note (Signed)
Continues yearly lung cancer screens

## 2023-10-17 NOTE — Assessment & Plan Note (Signed)
Chronic, update FLP on atorvastatin. The ASCVD Risk score (Arnett DK, et al., 2019) failed to calculate for the following reasons:   The valid total cholesterol range is 130 to 320 mg/dL

## 2023-10-17 NOTE — Assessment & Plan Note (Signed)
 Preventative protocols reviewed and updated unless pt declined. Discussed healthy diet and lifestyle.

## 2023-10-17 NOTE — Assessment & Plan Note (Signed)
 Rare OTC zegerid  use

## 2023-10-17 NOTE — Progress Notes (Addendum)
 Ph: [phone] Fax: [phone]   Patient ID: Ryan Mathews, male    DOB: [birth year], 78 y.o.   MRN: [id number]  This visit was conducted in person.  BP 134/80   Pulse (!) 55   Temp 98.2 F (36.8 C) (Oral)   Ht 5' 7 (1.702 m)   Wt 208 lb 6 oz (94.5 kg)   SpO2 96%   BMI 32.64 kg/m    CC: CPE Subjective:   HPI: Ryan Mathews is a 78 y.o. male presenting on 10/17/2023 for Annual Exam   Saw health advisor 07/2023 for medicare wellness visit. Note reviewed.   No results found.  Flowsheet Row Office Visit from 10/17/2023 in [facility] [place]  PHQ-2 Total Score 0       10/17/2023    9:45 AM 07/06/2023    6:17 PM 10/27/2022   10:34 AM 10/04/2022    3:42 PM 09/14/2022   11:28 AM  Fall Risk   Falls in the past year? 0 0 0 0 0  Number falls in past yr:  0 0 0   Injury with Fall?  0 0 0   Risk for fall due to :  No Fall Risks     Follow up  Education provided;Falls prevention discussed      Fully retired 02/2021.   Wife had emergency surgery for intestinal obstruction 07/2023. He has been caring for her. She now has colostomy.   Underwent minimally invasive mitral valve repair (semirigid 36mm Simulus annuloplasty band) at [facility] (Dr Ricky) 04/29/2021. Followed locally by Dr Ladona. Has been recommended endocarditis ppx.   Hospitalized for multifocal pneumonia 08/2022 - treated with IV zosyn , azithromycin  followed by oral cefpodoxime  + flagyl  x6 wks. Had pulm, ID f/u. Sequelae of cavitation to LUL. Also with very mild COPD on PFTs 2023, no benefit with Incruse.    Bit by tick early Spring 2025 - left buttock. Still has residual scarred nodule. No fever or rash or new joint pains after this.   Preventative: COLONOSCOPY 04/2022 - 6 TAs, mod diverticulosis, rpt 3 yrs (Pyrtle)  Prostate cancer screen - PSA reassuring. DRE reassuring 2019, 2021. Discussed aging out of prostate screening if PSA remains normal this year  Lung cancer screening - ex smoker  quit 2008. Yearly screens. Flu shot yearly  COVID vaccine Pfizer 03/2019, 04/2019, booster 12/2019, bivalent 12/2020 Tetanus - unsure  Pneumovax 2013, prevnar-13 10/2015, pneumovax 04/2017, prevar-20 to consider - checking with pharmacy RSV - 12/2022 Zostavax 2013 Shingrix - discussed, declines. Wife had bad reaction to this.  Advanced directive discussion: has this at home. Would like wife to be HCPOA. Doesn't want prolonged life support. Asked to bring me a copy.  Seat belt use discussed.  Sunscreen use discussed. No changing moles on skin. Sees derm.  Ex smoker - quit 2008. >30 PY hx Alcohol - seldom  Dentist - yearly - upper dentures early 2025 Eye exam yearly (Dr Leslee) - s/p cataract surgery spring 2022  Bowel - no constipation Bladder - no incontinence  Caffeine: 2 coffee, 2 tea/day Lives with wife and 4 cats and 1 dog, grown children Occupation: Administrator, arts - Radio broadcast assistant  Edu: HS Activity: stays active at work and in garden  Diet: poor water intake, fruits/vegetables daily      Relevant past medical, surgical, family and social history reviewed and updated as indicated. Interim medical history since our last visit reviewed. Allergies and medications reviewed and updated. Outpatient Medications  Prior to Visit  Medication Sig Dispense Refill   acetaminophen  (TYLENOL ) 650 MG CR tablet Take 650 mg by mouth 2 (two) times daily.     Ascorbic Acid  500 MG CHEW Chew 1 tablet by mouth daily.     aspirin  EC 81 MG tablet Take 81 mg by mouth daily. Swallow whole.     atorvastatin  (LIPITOR) 20 MG tablet Take 1 tablet (20 mg total) by mouth daily. 90 tablet 3   b complex vitamins tablet Take 1 tablet by mouth daily.     lisinopril  (ZESTRIL ) 10 MG tablet Take 1 tablet (10 mg total) by mouth at bedtime. 90 tablet 3   Omega-3 Fatty Acids (FISH OIL ) 1000 MG CAPS Take 1 capsule (1,000 mg total) by mouth daily.     Omeprazole -Sodium Bicarbonate  (ZEGERID  OTC) 20-1100 MG CAPS capsule Take 1  capsule by mouth daily before breakfast. (Patient taking differently: Take 1 capsule by mouth daily before breakfast. As needed) 28 each 0   No facility-administered medications prior to visit.     Per HPI unless specifically indicated in ROS section below Review of Systems  Constitutional:  Negative for activity change, appetite change, chills, fatigue, fever and unexpected weight change.  HENT:  Negative for hearing loss.   Eyes:  Negative for visual disturbance.  Respiratory:  Negative for cough, chest tightness, shortness of breath and wheezing.   Cardiovascular:  Negative for chest pain, palpitations and leg swelling.  Gastrointestinal:  Negative for abdominal distention, abdominal pain, blood in stool, constipation, diarrhea, nausea and vomiting.  Genitourinary:  Negative for difficulty urinating and hematuria.  Musculoskeletal:  Negative for arthralgias, myalgias and neck pain.  Skin:  Negative for rash.  Neurological:  Negative for dizziness, seizures, syncope and headaches.  Hematological:  Negative for adenopathy. Does not bruise/bleed easily.  Psychiatric/Behavioral:  Negative for dysphoric mood. The patient is not nervous/anxious.     Objective:  BP 134/80   Pulse (!) 55   Temp 98.2 F (36.8 C) (Oral)   Ht 5' 7 (1.702 m)   Wt 208 lb 6 oz (94.5 kg)   SpO2 96%   BMI 32.64 kg/m   Wt Readings from Last 3 Encounters:  10/17/23 208 lb 6 oz (94.5 kg)  07/13/23 205 lb (93 kg)  02/06/23 205 lb (93 kg)      Physical Exam Vitals and nursing note reviewed.  Constitutional:      General: He is not in acute distress.    Appearance: Normal appearance. He is well-developed. He is not ill-appearing.  HENT:     Head: Normocephalic and atraumatic.     Right Ear: Hearing, tympanic membrane, ear canal and external ear normal.     Left Ear: Hearing, tympanic membrane, ear canal and external ear normal.     Mouth/Throat:     Mouth: Mucous membranes are moist.     Pharynx:  Oropharynx is clear. No oropharyngeal exudate or posterior oropharyngeal erythema.  Eyes:     General: No scleral icterus.    Extraocular Movements: Extraocular movements intact.     Conjunctiva/sclera: Conjunctivae normal.     Pupils: Pupils are equal, round, and reactive to light.  Neck:     Thyroid : No thyroid  mass or thyromegaly.  Cardiovascular:     Rate and Rhythm: Normal rate and regular rhythm.     Pulses: Normal pulses.          Radial pulses are 2+ on the right side and 2+ on the left side.  Heart sounds: Murmur (3/6 systolic throughout) heard.  Pulmonary:     Effort: Pulmonary effort is normal. No respiratory distress.     Breath sounds: Normal breath sounds. No wheezing, rhonchi or rales.  Abdominal:     General: Bowel sounds are normal. There is no distension.     Palpations: Abdomen is soft. There is no mass.     Tenderness: There is no abdominal tenderness. There is no guarding or rebound.     Hernia: No hernia is present.  Musculoskeletal:        General: Normal range of motion.     Cervical back: Normal range of motion and neck supple.     Right lower leg: No edema.     Left lower leg: No edema.  Lymphadenopathy:     Cervical: No cervical adenopathy.  Skin:    General: Skin is warm and dry.     Findings: No rash.     Comments: L lateral upper thigh with scarred nodule at site of prior tick bite  Neurological:     General: No focal deficit present.     Mental Status: He is alert and oriented to person, place, and time.  Psychiatric:        Mood and Affect: Mood normal.        Behavior: Behavior normal.        Thought Content: Thought content normal.        Judgment: Judgment normal.       Results for orders placed or performed in visit on 10/27/22  CBC   Collection Time: 10/27/22 10:56 AM  Result Value Ref Range   WBC 9.1 3.8 - 10.8 Thousand/uL   RBC 4.17 (L) 4.20 - 5.80 Million/uL   Hemoglobin 13.3 13.2 - 17.1 g/dL   HCT 59.6 61.4 - 49.9 %   MCV  96.6 80.0 - 100.0 fL   MCH 31.9 27.0 - 33.0 pg   MCHC 33.0 32.0 - 36.0 g/dL   RDW 85.4 88.9 - 84.9 %   Platelets 263 140 - 400 Thousand/uL   MPV 10.3 7.5 - 12.5 fL  Basic metabolic panel   Collection Time: 10/27/22 10:56 AM  Result Value Ref Range   Glucose, Bld 107 (H) 65 - 99 mg/dL   BUN 19 7 - 25 mg/dL   Creat 9.01 9.29 - 8.71 mg/dL   BUN/Creatinine Ratio SEE NOTE: 6 - 22 (calc)   Sodium 137 135 - 146 mmol/L   Potassium 4.4 3.5 - 5.3 mmol/L   Chloride 105 98 - 110 mmol/L   CO2 26 20 - 32 mmol/L   Calcium  8.9 8.6 - 10.3 mg/dL   Lab Results  Component Value Date   CHOL 94 07/07/2021   HDL 35.10 (L) 07/07/2021   LDLCALC 37 07/07/2021   TRIG 114.0 07/07/2021   CHOLHDL 3 07/07/2021   Lab Results  Component Value Date   TSH 3.72 09/05/2022    Lab Results  Component Value Date   PSA 1.37 07/07/2021   PSA 0.84 05/06/2020   PSA 0.85 05/17/2019     Assessment & Plan:   Problem List Items Addressed This Visit     Advanced care planning/counseling discussion (Chronic)   Previously discussed. Asked to bring us  copy      S/P MVR (mitral valve repair) 36 mm Simulus semi rigid band) via [facility] 04/29/2021 (Chronic)   Need for SBE (subacute bacterial endocarditis) prophylaxis (Chronic)   Health maintenance examination - Primary (Chronic)   Preventative  protocols reviewed and updated unless pt declined. Discussed healthy diet and lifestyle.       Bullous emphysema (HCC)   He saw pulm, stable period off respiratory medications, f/u PRN.       Ex-smoker   Continues yearly lung cancer screens      GERD (gastroesophageal reflux disease)   Rare OTC zegerid  use      Relevant Medications   Omeprazole -Sodium Bicarbonate  (ZEGERID  OTC) 20-1100 MG CAPS capsule   Dyslipidemia   Chronic, update FLP on atorvastatin . The ASCVD Risk score (Arnett DK, et al., 2019) failed to calculate for the following reasons:   The valid total cholesterol range is 130 to 320 mg/dL        Relevant Orders   Lipid panel   Comprehensive metabolic panel with GFR   TSH   Metabolic dysfunction-associated fatty liver disease (MAFLD)   Previously noted on imaging. Update LFT, CBC      Relevant Orders   CBC with Differential/Platelet   CHF (congestive heart failure), NYHA class II, chronic, diastolic (HCC)   Seems euvolemic.       Other Visit Diagnoses       Special screening for malignant neoplasm of prostate       Relevant Orders   PSA        Meds ordered this encounter  Medications   Omeprazole -Sodium Bicarbonate  (ZEGERID  OTC) 20-1100 MG CAPS capsule    Sig: Take 1 capsule by mouth daily before breakfast. As needed for reflux    Orders Placed This Encounter  Procedures   Lipid panel   Comprehensive metabolic panel with GFR   PSA   TSH   CBC with Differential/Platelet    Patient Instructions  Labs today  Consider Prevnar-20  - double check with pharmacy on what you've recently gotten.  Bring us  a copy of your living will/advanced directive Consider stopping fish oil  Good to see you today Return in 1 year for next physical/wellness visit  Follow up plan: Return in about 1 year (around 10/16/2024) for annual exam, prior fasting for blood work, medicare wellness visit.  Anton Blas, MD

## 2023-10-17 NOTE — Assessment & Plan Note (Signed)
 Previously discussed. Asked to bring [place] copy.

## 2023-10-17 NOTE — Assessment & Plan Note (Signed)
 He saw pulm, stable period off respiratory medications, f/u PRN.

## 2023-10-22 ENCOUNTER — Ambulatory Visit: Payer: Self-pay | Admitting: Family Medicine

## 2024-01-15 ENCOUNTER — Ambulatory Visit (HOSPITAL_COMMUNITY): Admission: RE | Admit: 2024-01-15 | Payer: PPO | Source: Ambulatory Visit

## 2024-01-16 ENCOUNTER — Ambulatory Visit (HOSPITAL_COMMUNITY)
Admission: RE | Admit: 2024-01-16 | Discharge: 2024-01-16 | Disposition: A | Discharge status: Home / Self Care | Source: Ambulatory Visit | Attending: Cardiology | Admitting: Cardiology

## 2024-01-16 ENCOUNTER — Encounter (HOSPITAL_COMMUNITY): Payer: Self-pay

## 2024-01-16 DIAGNOSIS — J449 Chronic obstructive pulmonary disease, unspecified: Secondary | ICD-10-CM | POA: Diagnosis not present

## 2024-01-16 DIAGNOSIS — Z9889 Other specified postprocedural states: Secondary | ICD-10-CM | POA: Diagnosis not present

## 2024-01-16 DIAGNOSIS — I349 Nonrheumatic mitral valve disorder, unspecified: Secondary | ICD-10-CM | POA: Diagnosis not present

## 2024-01-16 DIAGNOSIS — T8209XS Other mechanical complication of heart valve prosthesis, sequela: Secondary | ICD-10-CM | POA: Insufficient documentation

## 2024-01-16 DIAGNOSIS — E785 Hyperlipidemia, unspecified: Secondary | ICD-10-CM | POA: Diagnosis not present

## 2024-01-16 DIAGNOSIS — I059 Rheumatic mitral valve disease, unspecified: Secondary | ICD-10-CM | POA: Diagnosis present

## 2024-01-16 DIAGNOSIS — I10 Essential (primary) hypertension: Secondary | ICD-10-CM | POA: Diagnosis not present

## 2024-01-16 LAB — ECHOCARDIOGRAM COMPLETE
Area-P 1/2: 2.73 cm2
MV M vel: 4.81 m/s
MV Peak grad: 92.5 mmHg
MV VTI: 1.73 cm2
Radius: 1 cm
S' Lateral: 3 cm

## 2024-01-18 ENCOUNTER — Ambulatory Visit: Payer: Self-pay | Admitting: Cardiology

## 2024-01-22 ENCOUNTER — Ambulatory Visit (INDEPENDENT_AMBULATORY_CARE_PROVIDER_SITE_OTHER)
Admission: RE | Admit: 2024-01-22 | Discharge: 2024-01-22 | Disposition: A | Discharge status: Home / Self Care | Source: Ambulatory Visit | Attending: Family Medicine | Admitting: Family Medicine

## 2024-01-22 ENCOUNTER — Ambulatory Visit: Admitting: Family Medicine

## 2024-01-22 ENCOUNTER — Ambulatory Visit: Payer: Self-pay

## 2024-01-22 ENCOUNTER — Encounter: Payer: Self-pay | Admitting: Family Medicine

## 2024-01-22 VITALS — BP 142/80 | HR 92 | Temp 100.9°F

## 2024-01-22 DIAGNOSIS — R918 Other nonspecific abnormal finding of lung field: Secondary | ICD-10-CM | POA: Diagnosis not present

## 2024-01-22 DIAGNOSIS — T8209XS Other mechanical complication of heart valve prosthesis, sequela: Secondary | ICD-10-CM

## 2024-01-22 DIAGNOSIS — R6883 Chills (without fever): Secondary | ICD-10-CM | POA: Diagnosis not present

## 2024-01-22 DIAGNOSIS — R051 Acute cough: Secondary | ICD-10-CM

## 2024-01-22 DIAGNOSIS — R059 Cough, unspecified: Secondary | ICD-10-CM | POA: Diagnosis not present

## 2024-01-22 DIAGNOSIS — J439 Emphysema, unspecified: Secondary | ICD-10-CM | POA: Diagnosis not present

## 2024-01-22 DIAGNOSIS — Z9889 Other specified postprocedural states: Secondary | ICD-10-CM

## 2024-01-22 DIAGNOSIS — J22 Unspecified acute lower respiratory infection: Secondary | ICD-10-CM

## 2024-01-22 DIAGNOSIS — I5032 Chronic diastolic (congestive) heart failure: Secondary | ICD-10-CM

## 2024-01-22 DIAGNOSIS — J90 Pleural effusion, not elsewhere classified: Secondary | ICD-10-CM | POA: Diagnosis not present

## 2024-01-22 DIAGNOSIS — T8209XA Other mechanical complication of heart valve prosthesis, initial encounter: Secondary | ICD-10-CM | POA: Insufficient documentation

## 2024-01-22 LAB — POC COVID19 BINAXNOW: SARS Coronavirus 2 Ag: NEGATIVE

## 2024-01-22 LAB — POCT INFLUENZA A/B
Influenza A, POC: NEGATIVE
Influenza B, POC: NEGATIVE

## 2024-01-22 MED ORDER — AMOXICILLIN-POT CLAVULANATE 875-125 MG PO TABS: 1.0000 | ORAL_TABLET | Freq: Two times a day (BID) | ORAL | 0 refills | Status: AC

## 2024-01-22 MED ORDER — AZITHROMYCIN 250 MG PO TABS: ORAL_TABLET | ORAL | 0 refills | Status: AC

## 2024-01-22 NOTE — Progress Notes (Signed)
 Ph: [phone] Fax: [phone]   Patient ID: Ryan Mathews, male    DOB: [birth year], 78 y.o.   MRN: [id number]  This visit was conducted in person.  BP (!) 142/80   Pulse 92   Temp (!) 100.9 F (38.3 C) (Oral)   SpO2 96%    CC: cough Subjective:   HPI: Ryan Mathews is a 78 y.o. male presenting on 01/22/2024 for Medical Management of Chronic Issues (Pt CC  Coughing, Chills, No N/V. Feverish)   Symptoms started after recent echocardiogram which showed severe mitral regurgitation despite minimally invasive mitral valve replacement 2023.   6d h/o cough productive of green phlegm, feverish, chills, dyspnea.  Some L ear ringing.  Last night developed drenching night sweats.  No body aches, ear or tooth pain, headache, ST, PNDrainage.  No nausea, vomiting, diarrhea.  No pedal edema, no orthopnea.   No sick contacts.  No known TB exposures.  No splinter hemorrhage.   Treating with cough drops, tylenol , delsym  cough medication with good effect.   Had been feeling well - this year put up 350 feet of fence.   H/o hospitalization for multifocal pneumonia 08/2022 with LUL cavitation as sequelae.  H/o  minimally invasive mitral valve repair (semirigid 36mm Simulus annuloplasty band) at [facility] (Dr Ricky) 04/29/2021.      Relevant past medical, surgical, family and social history reviewed and updated as indicated. Interim medical history since our last visit reviewed. Allergies and medications reviewed and updated. Outpatient Medications Prior to Visit  Medication Sig Dispense Refill   acetaminophen  (TYLENOL ) 650 MG CR tablet Take 650 mg by mouth 2 (two) times daily.     Ascorbic Acid  500 MG CHEW Chew 1 tablet by mouth daily.     aspirin  EC 81 MG tablet Take 81 mg by mouth daily. Swallow whole.     atorvastatin  (LIPITOR) 20 MG tablet Take 1 tablet (20 mg total) by mouth daily. 90 tablet 3   b complex vitamins tablet Take 1 tablet by mouth daily.     lisinopril  (ZESTRIL )  10 MG tablet Take 1 tablet (10 mg total) by mouth at bedtime. 90 tablet 3   Omeprazole -Sodium Bicarbonate  (ZEGERID  OTC) 20-1100 MG CAPS capsule Take 1 capsule by mouth daily before breakfast. As needed for reflux     Omega-3 Fatty Acids (FISH OIL ) 1000 MG CAPS Take 1 capsule (1,000 mg total) by mouth daily.     No facility-administered medications prior to visit.     Per HPI unless specifically indicated in ROS section below Review of Systems  Objective:  BP (!) 142/80   Pulse 92   Temp (!) 100.9 F (38.3 C) (Oral)   SpO2 96%   Wt Readings from Last 3 Encounters:  10/17/23 208 lb 6 oz (94.5 kg)  07/13/23 205 lb (93 kg)  02/06/23 205 lb (93 kg)      Physical Exam Vitals and nursing note reviewed.  Constitutional:      Appearance: Normal appearance. He is ill-appearing.  HENT:     Head: Normocephalic and atraumatic.     Right Ear: Hearing, tympanic membrane, ear canal and external ear normal. There is no impacted cerumen.     Left Ear: Hearing, tympanic membrane, ear canal and external ear normal. There is no impacted cerumen.     Nose:     Right Sinus: No maxillary sinus tenderness or frontal sinus tenderness.     Left Sinus: No maxillary sinus tenderness or frontal sinus tenderness.  Mouth/Throat:     Comments: Wearing mask Eyes:     Extraocular Movements: Extraocular movements intact.     Conjunctiva/sclera: Conjunctivae normal.     Pupils: Pupils are equal, round, and reactive to light.  Cardiovascular:     Rate and Rhythm: Normal rate and regular rhythm.     Pulses: Normal pulses.     Heart sounds: Normal heart sounds. No murmur heard. Pulmonary:     Effort: No respiratory distress.     Breath sounds: No wheezing, rhonchi or rales.     Comments:  Increased work of breathing however with overall clear lung exam. Musculoskeletal:     Cervical back: Normal range of motion and neck supple. No rigidity.     Right lower leg: No edema.     Left lower leg: No edema.   Lymphadenopathy:     Cervical: No cervical adenopathy.  Skin:    General: Skin is warm and dry.     Findings: No rash.  Neurological:     Mental Status: He is alert.  Psychiatric:        Mood and Affect: Mood normal.        Behavior: Behavior normal.       Results for orders placed or performed in visit on 01/22/24  POC COVID-19 BinaxNow   Collection Time: 01/22/24  4:41 PM  Result Value Ref Range   SARS Coronavirus 2 Ag Negative Negative  POCT Influenza A/B   Collection Time: 01/22/24  4:41 PM  Result Value Ref Range   Influenza A, POC Negative Negative   Influenza B, POC Negative Negative   Assessment & Plan:   Problem List Items Addressed This Visit     S/P MVR (mitral valve repair) 36 mm Simulus semi rigid band) via [facility] 04/29/2021 (Chronic)   Bullous emphysema (HCC)   Relevant Medications   azithromycin  (ZITHROMAX ) 250 MG tablet   Acute respiratory infection - Primary   Flu and covid swabs negative today  6d acute respiratory infection with productive cough.  H/o hospitalization for multifocal pneumonia 08/2023 complicated by cavitary LUL sequelae after infection.  Given comorbidities, will cover for community acquired and atypical pneumonia with azithromycin / augmentin  course.  Update if ongoing night sweats for further eval, r/o other cause such as endocarditis.       Relevant Medications   azithromycin  (ZITHROMAX ) 250 MG tablet   CHF (congestive heart failure), NYHA class II, chronic, diastolic (HCC)   No pedal edema, no significant crackles to lung exam - doubt CHF exacerbation contributing.       Prosthetic valve dysfunction   Recent echo showing severe mitral leak - pending cardiology f/u later this month.       Other Visit Diagnoses       Cough, unspecified type       Relevant Orders   POC COVID-19 BinaxNow (Completed)   POCT Influenza A/B (Completed)        Meds ordered this encounter  Medications   azithromycin  (ZITHROMAX ) 250 MG  tablet    Sig: Take two tablets on day one followed by one tablet on days 2-5    Dispense:  6 each    Refill:  0   amoxicillin -clavulanate (AUGMENTIN ) 875-125 MG tablet    Sig: Take 1 tablet by mouth 2 (two) times daily for 10 days.    Dispense:  20 tablet    Refill:  0    Orders Placed This Encounter  Procedures   POC COVID-19 BinaxNow  POCT Influenza A/B    Patient Instructions  Flu and covid swabs were negative today  Take course of augmentin  and azithromycin  antibiotics sent to pharmacy to cover lung infection.  Continue supportive measures - fluids, rest, cough remedies  Let us  know if fever >101, night sweats, progressive worsening cough or shortness of breath, or not improving with treatment.   Follow up plan: Return if symptoms worsen or fail to improve.  Anton Blas, MD

## 2024-01-22 NOTE — Telephone Encounter (Addendum)
 Noted. H/o hospitalization with pneumonia 2024, as well as complex cardiac valvular disease history.  Can we offer 4:30pm appt (arrive at 4pm) for COVID/flu swabs and possible CXR (ordered)?

## 2024-01-22 NOTE — Assessment & Plan Note (Signed)
 Recent echo showing severe mitral leak - pending cardiology f/u later this month.

## 2024-01-22 NOTE — Telephone Encounter (Signed)
 Called patient will be here at 4 send to xray as soon as he gets here

## 2024-01-22 NOTE — Addendum Note (Signed)
 Addended by: RILLA BALLER on: 01/22/2024 02:05 PM   Modules accepted: Orders

## 2024-01-22 NOTE — Patient Instructions (Addendum)
 Flu and covid swabs were negative today  Take course of augmentin  and azithromycin  antibiotics sent to pharmacy to cover lung infection.  Continue supportive measures - fluids, rest, cough remedies  Let us  know if fever >101, night sweats, progressive worsening cough or shortness of breath, or not improving with treatment.

## 2024-01-22 NOTE — Assessment & Plan Note (Signed)
 Flu and covid swabs negative today  6d acute respiratory infection with productive cough.  H/o hospitalization for multifocal pneumonia 08/2023 complicated by cavitary LUL sequelae after infection.  Given comorbidities, will cover for community acquired and atypical pneumonia with azithromycin / augmentin  course.  Update if ongoing night sweats for further eval, r/o other cause such as endocarditis.

## 2024-01-22 NOTE — Telephone Encounter (Signed)
 FYI Only or Action Required?: FYI only for provider: appointment scheduled on 01/23/24.  Patient was last seen in primary care on 10/17/2023 by Ryan Baller, MD.  Called Nurse Triage reporting URI.  Symptoms began several days ago.  Interventions attempted: OTC medications: delsym .  Symptoms are: gradually worsening.  Triage Disposition: See Today or Tomorrow in Office (overriding Home Care)  Patient/caregiver understands and will follow disposition?:  Reason for Disposition  Common cold with no complications  Answer Assessment - Initial Assessment Questions Pt reports cold symptoms and sweating 5 days with productive cough.   1. ONSET: When did the nasal discharge start?      5 days  3. COUGH: Do you have a cough? If Yes, ask: Describe the color of your mucus. (e.g., clear, white, yellow, green)     I cannot tell you  4. RESPIRATORY DISTRESS: Describe your breathing.      Denies  5. FEVER: Do you have a fever? If Yes, ask: What is your temperature, how was it measured, and when did it start?     Denies, currently 78F  Protocols used: Common Cold-A-AH Copied from CRM [id number]. Topic: Clinical - Red Word Triage >> Jan 22, 2024  9:07 AM Ryan Mathews ORN wrote: Red Word that prompted transfer to Nurse Triage: chest cold, sweating through his clothes, severe cough, no chills. Started Wednesday or Thursday last week.

## 2024-01-22 NOTE — Assessment & Plan Note (Signed)
 No pedal edema, no significant crackles to lung exam - doubt CHF exacerbation contributing.

## 2024-01-23 ENCOUNTER — Ambulatory Visit: Admitting: Family Medicine

## 2024-01-23 ENCOUNTER — Ambulatory Visit: Payer: Self-pay | Admitting: Family Medicine

## 2024-01-30 ENCOUNTER — Other Ambulatory Visit: Payer: Self-pay | Admitting: Cardiology

## 2024-01-30 DIAGNOSIS — I10 Essential (primary) hypertension: Secondary | ICD-10-CM

## 2024-01-30 DIAGNOSIS — E782 Mixed hyperlipidemia: Secondary | ICD-10-CM

## 2024-02-05 ENCOUNTER — Encounter: Payer: Self-pay | Admitting: Cardiology

## 2024-02-05 ENCOUNTER — Telehealth: Payer: Self-pay | Admitting: Family Medicine

## 2024-02-05 ENCOUNTER — Ambulatory Visit: Attending: Cardiology | Admitting: Cardiology

## 2024-02-05 VITALS — BP 122/68 | HR 102 | Ht 69.0 in | Wt 200.0 lb

## 2024-02-05 DIAGNOSIS — I5032 Chronic diastolic (congestive) heart failure: Secondary | ICD-10-CM | POA: Diagnosis not present

## 2024-02-05 DIAGNOSIS — J984 Other disorders of lung: Secondary | ICD-10-CM | POA: Diagnosis not present

## 2024-02-05 DIAGNOSIS — Z9889 Other specified postprocedural states: Secondary | ICD-10-CM

## 2024-02-05 DIAGNOSIS — J189 Pneumonia, unspecified organism: Secondary | ICD-10-CM

## 2024-02-05 DIAGNOSIS — Z2989 Encounter for other specified prophylactic measures: Secondary | ICD-10-CM | POA: Diagnosis not present

## 2024-02-05 DIAGNOSIS — T8209XD Other mechanical complication of heart valve prosthesis, subsequent encounter: Secondary | ICD-10-CM | POA: Diagnosis not present

## 2024-02-05 DIAGNOSIS — T8209XS Other mechanical complication of heart valve prosthesis, sequela: Secondary | ICD-10-CM

## 2024-02-05 NOTE — Progress Notes (Signed)
 Cardiology Office Note:  .   Date:  02/05/2024  ID:  Debby VEAR Hsu, DOB 1945/09/09, MRN [id number] PCP: Rilla Baller, MD  [facility] Providers Cardiologist:  Gordy Bergamo, MD   History of Present Illness: .   Ryan Mathews is a 78 y.o. Caucasian male patient with former tobacco use history, COPD, hypertension with myxomatous mitral valve and severe MR SP mitral valve repair on 04/29/2021 at [facility], chronic diastolic heart failure presents for annual visit.  He has prosthetic valve dysfunction with persistent moderate mitral regurgitation and normal LVEF.  This is annual visit.  About 3 weeks ago he developed upper respiratory infection with nasal congestion, cough and was on antibiotics for about 10 days or so, since then he has not felt well with continued cough, fatigue and lack of appetite.     Discussed the use of AI scribe software for clinical note transcription with the patient, who gave verbal consent to proceed.  History of Present Illness Ryan Mathews is a 78 year old male with mitral valve disease who presents with persistent cough and fatigue. He was referred by Dr. Rilla for evaluation of his persistent symptoms following an echocardiogram.  He experiences significant fatigue and a persistent dry cough for approximately two and a half to three weeks, starting two days after his echocardiogram. The cough occurs in spasms, particularly at night, disrupting sleep. He has significant night sweats, with his pillow becoming wet. There is a notable decrease in appetite and weight loss, which he attributes partly to antibiotics, including erythromycin and amoxicillin . He supplements his nutrition with protein drinks due to lack of appetite.  He has mitral valve disease with a valve leak previously assessed as moderate to moderately severe. He also has a history of COPD and a cavitary lesion in the left lung, associated with recurrent  infections. A recent chest x-ray showed a mass-like opacity in the left upper lobe. Sitting up helps drain fluid from the cavity, alleviating some symptoms.  His current medications include aspirin , a cholesterol medication, and lisinopril  10 mg.  Cardiac Studies relevent.    ECHOCARDIOGRAM COMPLETE 01/16/2024  1. Left ventricular ejection fraction, by estimation, is 60 to 65%. Left ventricular ejection fraction by 3D volume is 61 %. The left ventricle has normal function. The left ventricle has no regional wall motion abnormalities. Left ventricular diastolic parameters are consistent with Grade II diastolic dysfunction (pseudonormalization). Elevated left atrial pressure. 2. The mitral valve has been repaired/replaced. Severe mitral valve regurgitation (PISA radius 1cm, MR peak velocity 481cm/s, MR ERO 0.42cm2, RF 75cc) . No evidence of mitral stenosis. The mean mitral valve gradient is 4.0 mmHg. There is a 36 Simulus semi-rigid band present in the mitral position. Procedure Date: 04/29/2021. 3. Right ventricular systolic function is normal. The right ventricular size is mildly enlarged. There is moderately elevated pulmonary artery systolic pressure. The estimated right ventricular systolic pressure is 50.3 mmHg. 4. Left atrial size was mildly dilated. 5. The aortic valve is normal in structure. Aortic valve regurgitation is not visualized. No aortic stenosis is present. 6. The inferior vena cava is normal in size with greater than 50% respiratory variability, suggesting right atrial pressure of 3 mmHg.  CXR 2 view 01/22/2024: 1. Slightly more conspicuous left upper lobe masslike opacity with associated known cavitary lesion. Superimposed infection/inflammation not excluded. 2. Interval development of bilateral trace pleural effusions. 3. Consider further evaluation with CT chest with intravenous contrast.  Labs   Lab Results  Component Value Date   CHOL 97 10/17/2023   HDL 37.50 (L)  10/17/2023   LDLCALC 34 10/17/2023   TRIG 129.0 10/17/2023   CHOLHDL 3 10/17/2023   No results found for: LIPOA  Recent Labs    10/17/23 1013  NA 139  K 4.2  CL 104  CO2 29  GLUCOSE 93  BUN 16  CREATININE 1.05  CALCIUM  9.3    Lab Results  Component Value Date   ALT 17 10/17/2023   AST 27 10/17/2023   ALKPHOS 67 10/17/2023   BILITOT 0.7 10/17/2023      Latest Ref Rng & Units 10/17/2023   10:13 AM 10/27/2022   10:56 AM 10/04/2022    4:13 PM  CBC  WBC 4.0 - 10.5 K/uL 8.1  9.1  11.5   Hemoglobin 13.0 - 17.0 g/dL 84.6  86.6  86.9   Hematocrit 39.0 - 52.0 % 45.4  40.3  39.2   Platelets 150.0 - 400.0 K/uL 172.0  263  396    No results found for: HGBA1C  Lab Results  Component Value Date   TSH 2.71 10/17/2023    ROS  Review of Systems  Constitutional: Positive for malaise/fatigue and night sweats.  Cardiovascular:  Positive for dyspnea on exertion (acutely worse due to infection). Negative for chest pain, leg swelling, orthopnea and paroxysmal nocturnal dyspnea.   Physical Exam:   VS:  BP 122/68 (BP Location: Left Arm, Patient Position: Sitting, Cuff Size: Normal)   Pulse (!) 102   Ht 5' 9 (1.753 m)   Wt 200 lb (90.7 kg)   SpO2 94%   BMI 29.53 kg/m    Wt Readings from Last 3 Encounters:  02/05/24 200 lb (90.7 kg)  10/17/23 208 lb 6 oz (94.5 kg)  07/13/23 205 lb (93 kg)    BP Readings from Last 3 Encounters:  02/05/24 122/68  01/22/24 (!) 142/80  10/17/23 134/80   Physical Exam Neck:     Vascular: No JVD.  Cardiovascular:     Rate and Rhythm: Normal rate and regular rhythm.     Heart sounds: S1 normal and S2 normal. Murmur heard.     Blowing holosystolic murmur is present with a grade of 3/6 at the apex.     No gallop.  Pulmonary:     Effort: Pulmonary effort is normal.     Breath sounds: Normal breath sounds.  Abdominal:     General: Bowel sounds are normal.     Palpations: Abdomen is soft.  Musculoskeletal:     Right lower leg: No edema.      Left lower leg: No edema.    EKG:    EKG Interpretation Date/Time:  Monday February 05 2024 13:20:47 EST Ventricular Rate:  102 PR Interval:  138 QRS Duration:  82 QT Interval:  328 QTC Calculation: 427 R Axis:   119  Text Interpretation: EKG 02/05/2024: Sinus tachycardia at rate of 102 bpm, rightward axis deviation, incomplete right bundle branch block.  No evidence of ischemia.  No significant change from 09/05/2022. Confirmed by Mylin Gignac, Jagadeesh ([id number]) on 02/05/2024 1:23:39 PM    ASSESSMENT AND PLAN: .      ICD-10-CM   1. Prosthetic valve dysfunction, sequela  T82.09XS     2. S/P MVR (mitral valve repair) 36 mm Simulus semi rigid band) via Heartport 04/29/2021  Z98.890     3. Indication present for endocarditis prophylaxis  Z29.89     4. Chronic diastolic heart failure (HCC)  P49.67 EKG  12-Lead    5. Cavitary lung disease - left upper lobe  J98.4      Assessment & Plan Cavitary lung disease, left upper lobe with acute respiratory infection and persistent cough Cavitary lung disease in the left upper lobe with a mass-like opacity and associated bone cavity lesion, likely due to infection. Persistent dry cough and fatigue since echocardiogram. Recent antibiotics (erythromycin and amoxicillin ) completed, but symptoms persist. Suspected lingering infection requiring extended antibiotic therapy. No acute heart failure symptoms, indicating infection is primary cause of symptoms. - Sent message to Dr. Rilla to consider extending antibiotics for another 10-14 days, likely with Augmentin  (amoxicillin  and clavulanate). - Recommended repeat chest x-ray after completion of antibiotics to ensure resolution of mass-like opacity. - Advised steam inhalation and lying on the left side to facilitate drainage of the cavity.  Mechanical complication of mitral valve prosthesis with chronic diastolic heart failure Mitral valve prosthesis with moderate leakage, previously repaired in 2020. No  acute heart failure symptoms; heart function is normal. Chronic diastolic heart failure present but well-managed. Avoiding surgical intervention due to age and comorbidities. - Continue current management with aspirin , cholesterol medication, and lisinopril . - Monitor for symptoms of heart failure, such as shortness of breath or leg swelling.  Chronic obstructive pulmonary disease with emphysema COPD with emphysema contributing to respiratory symptoms. Persistent cough and fatigue likely exacerbated by current infection. No acute exacerbation noted. - Continue current management and monitor respiratory symptoms.  Obesity Present but not a primary concern during current illness. Lack of appetite noted, but not concerning due to current infection. Emphasis on maintaining hydration and nutrition through protein drinks. - Encouraged hydration and nutrition through protein drinks. - Advised against excessive concern over weight loss during illness.   Follow up: 3 months. Will consider TEE prior to referral to structural team.  Signed,  Gordy Bergamo, MD, [facility] 02/05/2024, 1:48 PM [facility] [address] [place], [place] [address] Phone: [phone]. Fax:  [phone]

## 2024-02-05 NOTE — Telephone Encounter (Signed)
 Copied from CRM #[id number]. Topic: General - Call Back - No Documentation >> Feb 05, 2024  2:26 PM Leah C wrote: Reason from CRM: patients wife is calling in to inquire on scheduling chest xray for patient or when the chest xray will be scheduled?   [phone] (M) >> Feb 05, 2024  2:30 PM Rea C wrote: They would also like to know if Dr. KANDICE would call in another round of antibiotics. Patient met with cardiologist today and was advised that he, Dr.Ganji is in communication with Dr. Rilla and would send message about calling in antibiotics for patient.

## 2024-02-05 NOTE — Patient Instructions (Signed)
 Medication Instructions:  Your physician recommends that you continue on your current medications as directed. Please refer to the Current Medication list given to you today.  *If you need a refill on your cardiac medications before your next appointment, please call your pharmacy*  Lab Work: NONE ordered at this time of appointment   Testing/Procedures: NONE ordered at this time of appointment   Follow-Up: At [facility], you and your health needs are our priority.  As part of our continuing mission to provide you with exceptional heart care, our providers are all part of one team.  This team includes your primary Cardiologist (physician) and Advanced Practice Providers or APPs (Physician Assistants and Nurse Practitioners) who all work together to provide you with the care you need, when you need it.  Your next appointment:   3 month(s)  Provider:   Gordy Bergamo, MD    We recommend signing up for the patient portal called MyChart.  Sign up information is provided on this After Visit Summary.  MyChart is used to connect with patients for Virtual Visits (Telemedicine).  Patients are able to view lab/test results, encounter notes, upcoming appointments, etc.  Non-urgent messages can be sent to your provider as well.   To learn more about what you can do with MyChart, go to [email or web address].

## 2024-02-06 ENCOUNTER — Ambulatory Visit: Payer: Self-pay

## 2024-02-06 MED ORDER — AMOXICILLIN-POT CLAVULANATE 875-125 MG PO TABS: 1.0000 | ORAL_TABLET | Freq: Two times a day (BID) | ORAL | 0 refills | Status: DC

## 2024-02-06 NOTE — Telephone Encounter (Signed)
 FYI Only or Action Required?: Action required by provider: clinical question for provider and update on patient condition.  Patient was last seen in primary care on 01/22/2024 by Rilla Baller, MD.  Called Nurse Triage reporting Medication Problem.  Symptoms began several days ago.  Interventions attempted: Nothing.  Symptoms are: unchanged.  Triage Disposition: Call PCP Within 24 Hours  Patient/caregiver understands and will follow disposition?: No, wishes to speak with PCP   Reason for Disposition  [1] Taking antibiotic > 72 hours (3 days) AND [2] symptoms (other than fever) not improved  Answer Assessment - Initial Assessment Questions No available appts today. Pt's wife declined appt.  Cough not improving, cardiologist advised chest xray; infection in upper left lung and to be prescribed more antibiotics.  Need suggestion for probiotic while antibiotics.  Patient and pt's wife reports:  1. NAME of MEDICINE: What medicine(s) are you calling about?     azithromycin  (ZITHROMAX ) 250 MG tablet and amoxicillin . Completed treatment last Friday, upset stomach, cardiologist advised another chest xray and antibiotics. 3. PRESCRIBER: Who prescribed the medicine? Reason: if prescribed by specialist, call should be referred to that group.     Gutierrez 4. SYMPTOMS: Do you have any symptoms? If Yes, ask: What symptoms are you having?  How bad are the symptoms (e.g., mild, moderate, severe)  Requesting to take probiotics with antiobitocs, can't eat and reports lost 14lbs, no diarrhea.  Last BM today, normal, brown; reports loss of appeitite.  Cough; dry cough; taking delsum for cough, fatigue   Denies fever, chills, n/v/d/, dizziness, HA. Reports 100 low fever at cardiologist appt  Reports able to eat and drink.no problems with b/b  Advised UC/ED if symptoms worsen.  Answer Assessment - Initial Assessment Questions No available appts today. Pt's wife declined  appt.  Cough not improving, cardiologist advised chest xray; infection in upper left lung and to be prescribed more antibiotics.  Need suggestion for probiotic while antibiotics.  Patient and pt's wife reports:  1. NAME of MEDICINE: What medicine(s) are you calling about?     azithromycin  (ZITHROMAX ) 250 MG tablet and amoxicillin . Completed treatment last Friday, upset stomach, cardiologist advised another chest xray and antibiotics. 3. PRESCRIBER: Who prescribed the medicine? Reason: if prescribed by specialist, call should be referred to that group.     Gutierrez 4. SYMPTOMS: Do you have any symptoms? If Yes, ask: What symptoms are you having?  How bad are the symptoms (e.g., mild, moderate, severe)  Requesting to take probiotics with antiobitocs, can't eat and reports lost 14lbs, no diarrhea.  Last BM today, normal, brown; reports loss of appeitite.  Cough; dry cough; taking delsum for cough, fatigue   Denies fever, chills, n/v/d/, dizziness, HA. Reports 100 low fever at cardiologist appt  Reports able to eat and drink.no problems with b/b  Advised UC/ED if symptoms worsen.  Protocols used: Medication Question Call-A-AH, Infection on Antibiotic Follow-up Call-A-AH

## 2024-02-06 NOTE — Telephone Encounter (Signed)
 From your results note on 01/23/24: Your chest xray returned showing possible pneumonia - let us  know if not improving with antibiotic course to consider CT scan for more detailed evaluation.  Dr Rilla

## 2024-02-06 NOTE — Telephone Encounter (Addendum)
 See yesterday's phone note  Spoke with patient -  He's lost 14 lbs - no appetite.  No fevers. Ongoing cough, with coughing fits. Less phlegm, more dry.  He is hesitant to take more antibiotics but would agree if that is recommendation.  He walked around backyard today however developed coughing fit.  Will treat with another 10 days of augmentin  sent to pharmacy.  RTC in 2 wks for CXR - ordered.

## 2024-02-06 NOTE — Telephone Encounter (Signed)
 Spoke with patient -  He's lost 14 lbs - no appetite.  No fevers. Ongoing cough, with coughing fits. Less phlegm, more dry.  He is hesitant to take more antibiotics but would agree if that is recommendation.  He walked around backyard today however developed coughing fit.  Will treat with another 10 days of augmentin  sent to pharmacy.  RTC in 2 wks for CXR - ordered.

## 2024-02-06 NOTE — Telephone Encounter (Signed)
 Copied from CRM #[id number]. Topic: Clinical - Medication Question >> Feb 06, 2024 12:30 PM Nessti S wrote: Reason for CRM: pt wife called because pt still has symptoms while taking the azithromycin  (ZITHROMAX ) 250 MG tablet and amoxicillin . She wants to know if pcp would like pt to come in for an appt. She would like call back number [phone]

## 2024-02-06 NOTE — Addendum Note (Signed)
 Addended by: RILLA BALLER on: 02/06/2024 05:04 PM   Modules accepted: Orders

## 2024-02-07 MED ORDER — AMOXICILLIN-POT CLAVULANATE 875-125 MG PO TABS: 1.0000 | ORAL_TABLET | Freq: Two times a day (BID) | ORAL | 0 refills | Status: AC

## 2024-02-07 NOTE — Telephone Encounter (Signed)
 Copied from CRM #[id number]. Topic: Clinical - Medication Question >> Feb 07, 2024  3:03 PM Aisha D wrote: Reason for CRM: Pt stated that he was informed he would be getting a refill for the amoxicillin -clavulanate (AUGMENTIN ) 875-125 MG tablet twice a day (56 tabs). Pt stated that he went to pick up the medication from the pharmacy and they gave him amoxicillin -clavulanate (AUGMENTIN ) 500 MG with only 20 tabs. Pt wants to know if someone can reach out to the pharmacy to get the medication corrected. Pt also wants to know if he is able to take the medication he currently has until the correct medication is sent to the pharmacy.

## 2024-02-07 NOTE — Telephone Encounter (Signed)
 See previous  phone note s/p addendum after receiving pulmonary recommendations

## 2024-02-07 NOTE — Telephone Encounter (Signed)
 Augmentin  875-125 mg tab rx sent today, #56/0 refills.   Spoke with Marolyn with [facility] relaying pt's message and asking about rx above. Per Marolyn, pt had already picked up yesterday's rx of 20 tabs. But they have today's rx and pt can return to pharmacy to get rest of tabs and be credited for the 20.   Spoke with pt relaying instructions from Marolyn to bring recent Augmentin  bottle of tablets and they will get everything straightened out for him. Pt verbalizes understanding and says he will go to pharmacy now. Pt expresses his thanks for the help.

## 2024-02-07 NOTE — Telephone Encounter (Addendum)
 Please notify patient -  Received recommendations from lung doctor Dr Annella. He recommends extending augmentin  antibiotic for total 4 weeks so I have sent in 4 wks of augmentin  to his pharmacy.  Also recommend repeating CT chest in 4 wks after antibiotic course - I have ordered. He will need to get scheduled at imaging center not at our office.  Likely will see pulm early next year in f/u after CT scan done.

## 2024-02-07 NOTE — Telephone Encounter (Signed)
 Called patient reviewed all information and repeated back to me. Pt states he has an appointment on 02/13/24 for CT Scan. Informed patient he needs to reschedule that appointment for  after his 4 weeks of antibiotics.

## 2024-02-07 NOTE — Addendum Note (Signed)
 Addended by: RILLA BALLER on: 02/07/2024 08:41 AM   Modules accepted: Orders

## 2024-02-12 DIAGNOSIS — L814 Other melanin hyperpigmentation: Secondary | ICD-10-CM | POA: Diagnosis not present

## 2024-02-12 DIAGNOSIS — L821 Other seborrheic keratosis: Secondary | ICD-10-CM | POA: Diagnosis not present

## 2024-02-12 DIAGNOSIS — L578 Other skin changes due to chronic exposure to nonionizing radiation: Secondary | ICD-10-CM | POA: Diagnosis not present

## 2024-02-12 DIAGNOSIS — D2272 Melanocytic nevi of left lower limb, including hip: Secondary | ICD-10-CM | POA: Diagnosis not present

## 2024-02-12 DIAGNOSIS — D1801 Hemangioma of skin and subcutaneous tissue: Secondary | ICD-10-CM | POA: Diagnosis not present

## 2024-02-12 DIAGNOSIS — Z86007 Personal history of in-situ neoplasm of skin: Secondary | ICD-10-CM | POA: Diagnosis not present

## 2024-02-13 ENCOUNTER — Other Ambulatory Visit

## 2024-02-23 ENCOUNTER — Other Ambulatory Visit (INDEPENDENT_AMBULATORY_CARE_PROVIDER_SITE_OTHER)

## 2024-02-23 ENCOUNTER — Telehealth: Payer: Self-pay | Admitting: Family Medicine

## 2024-02-23 DIAGNOSIS — J189 Pneumonia, unspecified organism: Secondary | ICD-10-CM

## 2024-02-23 DIAGNOSIS — J984 Other disorders of lung: Secondary | ICD-10-CM | POA: Diagnosis not present

## 2024-02-23 LAB — COMPREHENSIVE METABOLIC PANEL WITH GFR
ALT: 31 U/L (ref 0–53)
AST: 33 U/L (ref 0–37)
Albumin: 3.6 g/dL (ref 3.5–5.2)
Alkaline Phosphatase: 78 U/L (ref 39–117)
BUN: 22 mg/dL (ref 6–23)
CO2: 27 meq/L (ref 19–32)
Calcium: 9.6 mg/dL (ref 8.4–10.5)
Chloride: 103 meq/L (ref 96–112)
Creatinine, Ser: 1.11 mg/dL (ref 0.40–1.50)
GFR: 64.31 mL/min (ref >60.00)
Glucose, Bld: 106 mg/dL — ABNORMAL HIGH (ref 70–99)
Potassium: 4.4 meq/L (ref 3.5–5.1)
Sodium: 137 meq/L (ref 135–145)
Total Bilirubin: 0.5 mg/dL (ref 0.2–1.2)
Total Protein: 8.3 g/dL (ref 6.0–8.3)

## 2024-02-23 LAB — CBC WITH DIFFERENTIAL/PLATELET
Basophils Absolute: 0.1 K/uL (ref 0.0–0.1)
Basophils Relative: 0.8 % (ref 0.0–3.0)
Eosinophils Absolute: 0.3 K/uL (ref 0.0–0.7)
Eosinophils Relative: 2.6 % (ref 0.0–5.0)
HCT: 42.2 % (ref 39.0–52.0)
Hemoglobin: 14.3 g/dL (ref 13.0–17.0)
Lymphocytes Relative: 18.9 % (ref 12.0–46.0)
Lymphs Abs: 2.3 K/uL (ref 0.7–4.0)
MCHC: 34 g/dL (ref 30.0–36.0)
MCV: 93.2 fl (ref 78.0–100.0)
Monocytes Absolute: 1 K/uL (ref 0.1–1.0)
Monocytes Relative: 8.4 % (ref 3.0–12.0)
Neutro Abs: 8.4 K/uL — ABNORMAL HIGH (ref 1.4–7.7)
Neutrophils Relative %: 69.3 % (ref 43.0–77.0)
Platelets: 404 K/uL — ABNORMAL HIGH (ref 150.0–400.0)
RBC: 4.53 Mil/uL (ref 4.22–5.81)
RDW: 13.7 % (ref 11.5–15.5)
WBC: 12.2 K/uL — ABNORMAL HIGH (ref 4.0–10.5)

## 2024-02-23 NOTE — Telephone Encounter (Signed)
 Pt walked in for CXR - but has CT scheduled for next week. Will defer CXR for now.  Will check labs today given 10 lb weight loss noted (191 lbs).

## 2024-02-26 ENCOUNTER — Ambulatory Visit: Payer: Self-pay | Admitting: Family Medicine

## 2024-02-29 ENCOUNTER — Inpatient Hospital Stay: Admission: RE | Admit: 2024-02-29

## 2024-02-29 DIAGNOSIS — J189 Pneumonia, unspecified organism: Secondary | ICD-10-CM

## 2024-03-04 ENCOUNTER — Ambulatory Visit: Payer: Self-pay | Admitting: Family Medicine

## 2024-03-11 NOTE — Progress Notes (Signed)
 Pt has been scheduled next avail 1/19 to review imaging in detail.  NFN

## 2024-04-01 ENCOUNTER — Ambulatory Visit: Admitting: Pulmonary Disease

## 2024-04-03 ENCOUNTER — Encounter: Payer: Self-pay | Admitting: Pulmonary Disease

## 2024-04-03 ENCOUNTER — Ambulatory Visit: Admitting: Pulmonary Disease

## 2024-04-03 VITALS — BP 135/81 | HR 63 | Temp 98.0°F | Ht 69.0 in | Wt 201.6 lb

## 2024-04-03 DIAGNOSIS — J188 Other pneumonia, unspecified organism: Secondary | ICD-10-CM

## 2024-04-03 DIAGNOSIS — Z87891 Personal history of nicotine dependence: Secondary | ICD-10-CM

## 2024-04-03 DIAGNOSIS — J449 Chronic obstructive pulmonary disease, unspecified: Secondary | ICD-10-CM

## 2024-04-03 DIAGNOSIS — J439 Emphysema, unspecified: Secondary | ICD-10-CM

## 2024-04-03 DIAGNOSIS — J189 Pneumonia, unspecified organism: Secondary | ICD-10-CM

## 2024-04-03 NOTE — Progress Notes (Signed)
 "  @Patient  ID: Ryan Mathews, male    DOB: 01-09-1946, 79 y.o.   MRN: [id number]  Chief Complaint  Patient presents with   Follow-up    Pt states since LOV breathing is getting better, pt stated he recently had a viral infection that lasted about 6-7 weeks SOB occurs when out in cold air Prod cough occurs morning and evening ( phlegm gray, greenish) pt stated only a little bit of phlegm comes up now compared to the last time     Referring provider: Rilla Baller, MD  HPI:   79 y.o. man whom we are seeing in follow-up of cavitary pneumonia 08/2022 with recent recurrence of pneumonia.  Most recent cardiology note reviewed.  Most recent PCP note reviewed.  Began feeling ill early November.  This was a couple days after an appointment for echocardiogram.  Chills fevers etc.  Upper respiratory infection.  Worsening symptoms.  Malaise, weakness.  Chest x-ray obtained by PCP revealed left upper lobe cavitary lesion.  Notably this was present on prior CT scan most recently 11/2022 prior to that visit.  Discussed with PCP.  Agreed with 4 weeks of treatment of Augmentin  for concern for recurrence of cavitary pneumonia or more likely superinfection of cavitary lesion from prior pneumonia.  CT scan was recommended at 4 weeks.  This was obtained 02/29/2024.  On my review interpretation this reveals similar appearing cavitary lesion with further obliteration of internal contents, left upper lobe as well as left upper lobe surrounding infiltrate and left lower lobe which are concerning for acute pneumonia.  Overall he is greatly improved.  Had terrible appetite with the antibiotic, Augmentin .  Was only able to keep down like protein shakes.  Lost around 25 pounds.  He has gained back at least 10 to 15 pounds of this over the last couple of weeks.  Started turning the corner and gaining weight around Christmas.  His cough is markedly better.  His shortness of breath and chest discomfort is markedly better.   Albeit mild residual symptoms remain.  We discussed the role and rationale of bronchoscopy, given his weight gain and improvement in symptoms I think this is low yield.  We discussed inhalers with his underlying emphysema.  In general, prior to recent illness, he did not really complain of much dyspnea.  We discussed continued gradual exercise program to improve breathing.  We discussed using inhalers in the future.  We discussed repeating a CT scan to ensure improvement of prior pneumonia.  He is in agreement.  HPI initial visit: Patient was in usual state of health.  Developed worsening shortness of breath.  Presented to PCP 01/25/2021.  chest x-ray obtained that day is reviewed and interpreted as increased interstitial markings as well as bilateral pleural effusions consistent with pulmonary edema and volume overload.  Concern for interstitial lung disease per radiology report.  This prompted referral.  In the interim, patient was found to have severe mitral valve regurgitation.  He was placed on Lasix .  His shortness of breath is greatly improved.  He reports a 15 pound loss of water weight.  He has upcoming consultation with cardiothoracic surgeon at [facility] for what he reports is robotic surgical repair of the mitral valve.  Reviewed CT chest 12/2019 that on my review and interpretation reveals emphysema, no ILD or evidence of scarring.  Reviewed right heart catheterization 02/12/2021 that shows normal RA pressure, normal mean pulmonary pressure of 18, mean wedge of 12, LVEDP of 8 on the left  heart catheterization.  PMH: Hypertension, hyperlipidemia, mitral valve regurg Surgical history: Heart valve surgery, Back surgery Family history: CAD in first relatives, no significant respiratory issues and close relatives Social history: Quit 2014, 80-pack-year history, lives in [place] / Pulmonary Flowsheets:   ACT:      No data to display          MMRC:     No data to  display          Epworth:      No data to display          Tests:   FENO:  No results found for: NITRICOXIDE  PFT:    Latest Ref Rng & Units 04/07/2021    3:44 PM  PFT Results  FVC-Pre L 3.65   FVC-Predicted Pre % 92   FVC-Post L 4.10   FVC-Predicted Post % 104   Pre FEV1/FVC % % 65   Post FEV1/FCV % % 62   FEV1-Pre L 2.36   FEV1-Predicted Pre % 82   FEV1-Post L 2.56   DLCO uncorrected ml/min/mmHg 13.27   DLCO UNC% % 55   DLCO corrected ml/min/mmHg 12.70   DLCO COR %Predicted % 53   DLVA Predicted % 52   TLC L 6.44   TLC % Predicted % 97   RV % Predicted % 94   Personally reviewed interpreted as very mild fixed obstruction, lung volumes within the limits, DLCO moderately reduced  WALK:      No data to display          Imaging: Personally reviewed and as per EMR discussion this note No results found.  Lab Results: Personally reviewed CBC    Component Value Date/Time   WBC 12.2 (H) 02/23/2024 1013   RBC 4.53 02/23/2024 1013   HGB 14.3 02/23/2024 1013   HGB 18.4 (H) 02/08/2021 0908   HCT 42.2 02/23/2024 1013   HCT 53.6 (H) 02/08/2021 0908   PLT 404.0 (H) 02/23/2024 1013   PLT 245 02/08/2021 0908   MCV 93.2 02/23/2024 1013   MCV 93 02/08/2021 0908   MCH 31.9 10/27/2022 1056   MCHC 34.0 02/23/2024 1013   RDW 13.7 02/23/2024 1013   RDW 12.2 02/08/2021 0908   LYMPHSABS 2.3 02/23/2024 1013   MONOABS 1.0 02/23/2024 1013   EOSABS 0.3 02/23/2024 1013   BASOSABS 0.1 02/23/2024 1013    BMET    Component Value Date/Time   NA 137 02/23/2024 1013   NA 140 06/02/2022 0745   K 4.4 02/23/2024 1013   CL 103 02/23/2024 1013   CO2 27 02/23/2024 1013   GLUCOSE 106 (H) 02/23/2024 1013   BUN 22 02/23/2024 1013   BUN 26 06/02/2022 0745   CREATININE 1.11 02/23/2024 1013   CREATININE 0.98 10/27/2022 1056   CALCIUM  9.6 02/23/2024 1013   GFRNONAA >60 09/10/2022 0425   GFRAA >90 03/19/2011 0025    BNP    Component Value Date/Time   BNP 33.7  03/15/2021 2158    ProBNP    Component Value Date/Time   PROBNP 301 08/30/2022 1355   PROBNP 112.0 (H) 01/25/2021 1300    Specialty Problems       Pulmonary Problems   Bullous emphysema (HCC)   Mild - FEv1 77% Marked biapical bullous type emphysema by CT 04/2017 Diffuse bronchial wall thickening with severe centrilobular and paraseptal emphysema by CT 12/2017 CT 11/2018 - moderate to advanced centrilobular and paraseptal emphysema. Bilateral peripheral subpleural reticulation and banding with a  lower lung zone predominance is again noted. No frank honeycombing      Nasal septal deviation   Marked      Cavitary pneumonia    No Known Allergies  Immunization History  Administered Date(s) Administered    sv, Bivalent, Protein Subunit Rsvpref,pf (Abrysvo) 12/14/2022   Fluad Quad(high Dose 65+) 12/01/2023   INFLUENZA, HIGH DOSE SEASONAL PF 12/22/2016, 01/25/2018, 11/23/2018, 11/29/2019, 12/18/2020, 12/21/2021   Influenza Split 11/13/2010   Influenza Whole 12/13/2011   Influenza,inj,Quad PF,6+ Mos 12/22/2015   Influenza-Unspecified 01/16/2021, 11/28/2022   Moderna Covid-19 Fall Seasonal Vaccine 64yrs & older 12/21/2021   PFIZER(Purple Top)SARS-COV-2 Vaccination 04/05/2019, 04/26/2019, 12/16/2019   Pfizer Covid-19 Vaccine Bivalent Booster 72yrs & up 01/08/2021   Pneumococcal Conjugate-13 10/15/2015   Pneumococcal Polysaccharide-23 05/03/2011, 04/21/2017   Zoster, Live 01/13/2012    Past Medical History:  Diagnosis Date   Acute midline low back pain with right-sided sciatica 08/14/2020   L MRI: Large right central/subarticular superiorly migrating disc extrusion at L4-5 resulting in severe spinal canal stenosis and likely impinging on the exiting right L4 and traversing right L5 nerve roots.  Established with NSG (Dr Dawley) rec L4/5 microdiskectomy      Bilateral inguinal hernia (BIH) s/p lap repair 09/06/2012 08/09/2012   Cataract    CMC arthritis, thumb, degenerative     right   COPD (chronic obstructive pulmonary disease) (HCC)    Emphysema of lung (HCC)    Ex-smoker    GERD (gastroesophageal reflux disease)    History of chicken pox    History of pneumonia 08/06/2009        Hyperlipidemia    Hypertension    Nasal septal deviation 04/14/2017   Marked   Numbness    R cheek stays numb   Obesity (BMI 30-39.9) 08/09/2012   S/P MVR (mitral valve repair) 36 mm Simulus semi rigid band) via Heartport 04/29/2021 04/29/2021   S/p minimally invasive mitral valve repair (semirigid 36mm Simulus annuloplasty band) at [facility] (Dr Ricky) 04/29/2021. Followed locally by Dr Ladona. Has been recommended dental ppx.   Echo 05/2022 - normal LVEF 55-60%, mod concentric LVH, G2DD, normal wall motion, mild-mod dilated LA, mod MR with MV annular ring repair with wall impinging MR jet but improved, mild TR (Dr Dewane)    Tobacco History: Social History   Tobacco Use  Smoking Status Former   Current packs/day: 0.00   Average packs/day: 2.0 packs/day for 40.0 years (80.0 ttl pk-yrs)   Types: Cigarettes   Start date: 03/14/1968   Quit date: 03/14/2006   Years since quitting: 18.0   Passive exposure: Past  Smokeless Tobacco Never   Counseling given: Not Answered   Continue to not smoke  Outpatient Encounter Medications as of 04/03/2024  Medication Sig   acetaminophen  (TYLENOL ) 650 MG CR tablet Take 650 mg by mouth 2 (two) times daily.   Ascorbic Acid  500 MG CHEW Chew 1 tablet by mouth daily.   aspirin  EC 81 MG tablet Take 81 mg by mouth daily. Swallow whole.   atorvastatin  (LIPITOR) 20 MG tablet TAKE 1 TABLET BY MOUTH EVERY DAY   b complex vitamins tablet Take 1 tablet by mouth daily.   lisinopril  (ZESTRIL ) 10 MG tablet TAKE 1 TABLET BY MOUTH EVERYDAY AT BEDTIME   Omeprazole -Sodium Bicarbonate  (ZEGERID  OTC) 20-1100 MG CAPS capsule Take 1 capsule by mouth daily before breakfast. As needed for reflux   azithromycin  (ZITHROMAX ) 250 MG tablet Take two tablets on day one followed  by one tablet on days 2-5 (  Patient not taking: Reported on 04/03/2024)   No facility-administered encounter medications on file as of 04/03/2024.     Review of Systems  Review of Systems  N/a Physical Exam  BP 135/81   Pulse 63   Temp 98 F (36.7 C) (Oral)   Ht 5' 9 (1.753 m) Comment: per patient  Wt 201 lb 9.6 oz (91.4 kg)   SpO2 97% Comment: on RA  BMI 29.77 kg/m   Wt Readings from Last 5 Encounters:  04/03/24 201 lb 9.6 oz (91.4 kg)  02/05/24 200 lb (90.7 kg)  10/17/23 208 lb 6 oz (94.5 kg)  07/13/23 205 lb (93 kg)  02/06/23 205 lb (93 kg)    BMI Readings from Last 5 Encounters:  04/03/24 29.77 kg/m  02/05/24 29.53 kg/m  10/17/23 32.64 kg/m  07/13/23 30.27 kg/m  02/06/23 30.27 kg/m     Physical Exam General: Well-appearing, in chair, no distress Eyes: No icterus Neck: No JVP Cardiovascular: Regular rate and rhythm, no murmur Pulmonary: Clear bilaterally, normal work of breathing, good air excursion    Assessment & Plan:   Community-acquired pneumonia: Multifocal, left upper lobe, left lower lobe.  With prior cavitary lesion present.  Symptomatically improved after 4 weeks of Augmentin .  Will repeat CT scan in coming months to ensure resolution and to assess for any further structural damage after pneumonia.  Very mild COPD: On PFTs 2023.  FEV1 and FVC both very high, FEV1 over 90% predicted.  Historically no real symptoms.  Improvement in dyspnea on exertion after recent pneumonia is encouraging.  Increased use in the past without real improvement.  Consider inhalers in the future, particularly LAMA LABA therapy if not continuing improved with gradual exercise program.  I expect things will continue to improve.   Return in about 3 months (around 07/02/2024) for f/u Dr. Annella.   Donnice JONELLE Annella, MD 04/03/2024  I spent 42 minutes in the care of the patient today in face-to-face visit, coordination of care, review of records.  "

## 2024-04-03 NOTE — Patient Instructions (Signed)
 Nice to see you again  I am glad you are improving, very encouraging  Will repeat a CT scan and a follow-up in 3 months to make sure everything is getting better  We can reassess need for inhaler at that time  Return to clinic in 3 months after CT scan with Dr. Annella

## 2024-05-10 ENCOUNTER — Ambulatory Visit: Admitting: Cardiology

## 2024-06-05 ENCOUNTER — Other Ambulatory Visit

## 2024-07-04 ENCOUNTER — Ambulatory Visit: Admitting: Pulmonary Disease

## 2024-07-16 ENCOUNTER — Ambulatory Visit

## 2024-10-16 ENCOUNTER — Encounter: Admitting: Family Medicine
